# Patient Record
Sex: Male | Born: 1945
Health system: Southern US, Community
[De-identification: ages and names within clinical notes are randomized; demographics above are authoritative.]

## PROBLEM LIST (undated history)

## (undated) DIAGNOSIS — F1020 Alcohol dependence, uncomplicated: Secondary | ICD-10-CM

## (undated) DIAGNOSIS — R040 Epistaxis: Secondary | ICD-10-CM

## (undated) DIAGNOSIS — K709 Alcoholic liver disease, unspecified: Secondary | ICD-10-CM

## (undated) DIAGNOSIS — R413 Other amnesia: Secondary | ICD-10-CM

## (undated) DIAGNOSIS — R634 Abnormal weight loss: Secondary | ICD-10-CM

## (undated) DIAGNOSIS — N529 Male erectile dysfunction, unspecified: Secondary | ICD-10-CM

## (undated) DIAGNOSIS — Z720 Tobacco use: Secondary | ICD-10-CM

## (undated) DIAGNOSIS — E7800 Pure hypercholesterolemia, unspecified: Secondary | ICD-10-CM

## (undated) DIAGNOSIS — M069 Rheumatoid arthritis, unspecified: Secondary | ICD-10-CM

## (undated) DIAGNOSIS — R945 Abnormal results of liver function studies: Secondary | ICD-10-CM

## (undated) DIAGNOSIS — J439 Emphysema, unspecified: Secondary | ICD-10-CM

## (undated) DIAGNOSIS — K219 Gastro-esophageal reflux disease without esophagitis: Secondary | ICD-10-CM

## (undated) DIAGNOSIS — E7220 Disorder of urea cycle metabolism, unspecified: Secondary | ICD-10-CM

## (undated) DIAGNOSIS — R2689 Other abnormalities of gait and mobility: Secondary | ICD-10-CM

## (undated) DIAGNOSIS — I4510 Unspecified right bundle-branch block: Secondary | ICD-10-CM

## (undated) DIAGNOSIS — I998 Other disorder of circulatory system: Secondary | ICD-10-CM

## (undated) DIAGNOSIS — R296 Repeated falls: Secondary | ICD-10-CM

## (undated) HISTORY — DX: Emphysema, unspecified: J43.9

## (undated) HISTORY — PX: VASECTOMY: SHX75

## (undated) HISTORY — PX: VASECTOMY REVERSAL: SHX243

## (undated) HISTORY — DX: Alcohol dependence, uncomplicated: F10.20

---
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
## 2002-11-15 ENCOUNTER — Encounter: Admission: RE | Admit: 2002-11-15 | Discharge: 2002-11-15 | Payer: Self-pay | Admitting: Family Medicine

## 2002-11-15 ENCOUNTER — Encounter: Payer: Self-pay | Admitting: Family Medicine

## 2004-12-03 ENCOUNTER — Emergency Department (HOSPITAL_COMMUNITY): Admission: EM | Admit: 2004-12-03 | Discharge: 2004-12-03 | Payer: Self-pay | Admitting: Emergency Medicine

## 2008-04-27 DIAGNOSIS — M069 Rheumatoid arthritis, unspecified: Secondary | ICD-10-CM

## 2008-04-27 HISTORY — DX: Rheumatoid arthritis, unspecified: M06.9

## 2011-11-24 ENCOUNTER — Ambulatory Visit: Payer: Managed Care, Other (non HMO) | Admitting: Family Medicine

## 2011-11-24 ENCOUNTER — Ambulatory Visit: Payer: Managed Care, Other (non HMO)

## 2011-11-24 VITALS — BP 156/88 | HR 70 | Temp 98.2°F | Resp 14 | Ht 69.5 in | Wt 152.4 lb

## 2011-11-24 DIAGNOSIS — M25512 Pain in left shoulder: Secondary | ICD-10-CM

## 2011-11-24 DIAGNOSIS — M25519 Pain in unspecified shoulder: Secondary | ICD-10-CM

## 2011-11-24 MED ORDER — MELOXICAM 15 MG PO TABS: 15.0000 mg | ORAL_TABLET | Freq: Every day | ORAL | Status: DC

## 2011-11-24 MED ORDER — HYDROCODONE-ACETAMINOPHEN 5-325 MG PO TABS: 1.0000 | ORAL_TABLET | Freq: Every evening | ORAL | Status: AC | PRN

## 2011-11-24 NOTE — Progress Notes (Signed)
  Subjective:    Patient ID: William Gibson, male    DOB: 1946-03-22, 66 y.o.   MRN: [id number]  HPI Pt presents to clinic with 1-2 wks h/o L shoulder pain.  He is having trouble/pain with any movement of shoulder esp anterior shoulder movement.  He has had intermittent problems in the past but they have been the worst over the last couple of weeks.  No pain down arms or weakness.  The pain is sharp.  He has tried OTC motrin intermittently without any help.  He noticed that his shoulder was hurting a little after the glue on boxes at work got harder to open but over the last week it is worse.  He is R handed and having no problems with his R shoulder. He pushes with his R and pulls with his L.  He is unable to reach up and get something out of the cabinet due to pain.  Yesterday at work he kept his hand in his pocket so he would not use it because of the pain.  He has not had any injury known to this shoulder.   Review of Systems  Musculoskeletal: Negative for joint swelling.       Objective:   Physical Exam  Vitals reviewed. Constitutional: He is oriented to person, place, and time. He appears well-developed and well-nourished.  HENT:  Head: Normocephalic and atraumatic.  Right Ear: External ear normal.  Left Ear: External ear normal.  Nose: Nose normal.  Eyes: Conjunctivae are normal.  Pulmonary/Chest: Effort normal.  Musculoskeletal:       Left shoulder: He exhibits decreased range of motion (2nd to pain), pain, spasm (trap on L) and decreased strength (? 2nd to pain). He exhibits no tenderness, no bony tenderness, no swelling, no crepitus and no deformity.       Pt has good internal rotation and external rotation.  Pt unable to flex or abduct shoulder due to pain.  Pt can wiggle it into position or lift his L arm with his R. Neg impingement, unable to empty can because cannot hold up arm in the position.  + Neers and + hawkins.  Neurological: He is alert and oriented to person, place,  and time.  Skin: Skin is warm and dry.  Psychiatric: He has a normal mood and affect. His behavior is normal. Judgment and thought content normal.   [facility] reading (PRIMARY) by  Dr. Neva Seat. Degenerative changes in the [facility] joint but otherwise Nl study.     Assessment & Plan:   1. Left shoulder pain  DG Shoulder Left, HYDROcodone-acetaminophen (NORCO/VICODIN) 5-325 MG per tablet, meloxicam (MOBIC) 15 MG tablet  believe pt has subacromial bursitis and possible rotator cuff tendonitis/tendonosis.  Will try conservative measures for about 1 wk with NSAIDs and ice and then if no improvement consider shoulder injection.  Pt agrees with the plan.  D/w Dr. Neva Seat.

## 2011-11-24 NOTE — Progress Notes (Signed)
Xray read and patient discussed with Benny Lennert, PA-C.Marland Kitchen Agree with assessment and plan of care per her note.  Suspected rtc tendinosis with secondary subacromial bursitis.

## 2012-04-21 ENCOUNTER — Telehealth: Payer: Self-pay

## 2012-04-21 NOTE — Telephone Encounter (Signed)
Is there a 30 min spot where I can work him in the next week or two?

## 2012-04-21 NOTE — Telephone Encounter (Signed)
Appointment scheduled for next week.

## 2012-04-21 NOTE — Telephone Encounter (Signed)
pts wife, Rosey Bath said she had spoken with Dr Dayton Martes about seeing William Gibson; William Gibson not established; William Gibson has cough, usually non productive,hoarseness,? Fever(feels warm). William Gibson has been sick for over one month. Rosey Bath will advise William Gibson to go to [facility] but wants to establish with Dr Dayton Martes right away.Please advise.

## 2012-04-26 ENCOUNTER — Ambulatory Visit (INDEPENDENT_AMBULATORY_CARE_PROVIDER_SITE_OTHER)
Admission: RE | Admit: 2012-04-26 | Discharge: 2012-04-26 | Disposition: A | Discharge status: Home / Self Care | Payer: 59 | Source: Ambulatory Visit | Attending: Family Medicine | Admitting: Family Medicine

## 2012-04-26 ENCOUNTER — Encounter: Payer: Self-pay | Admitting: Internal Medicine

## 2012-04-26 ENCOUNTER — Ambulatory Visit (INDEPENDENT_AMBULATORY_CARE_PROVIDER_SITE_OTHER): Payer: 59 | Admitting: Family Medicine

## 2012-04-26 ENCOUNTER — Encounter: Payer: Self-pay | Admitting: Family Medicine

## 2012-04-26 VITALS — BP 144/90 | HR 60 | Temp 97.6°F | Ht 68.5 in | Wt 152.0 lb

## 2012-04-26 DIAGNOSIS — Z720 Tobacco use: Secondary | ICD-10-CM

## 2012-04-26 DIAGNOSIS — R1310 Dysphagia, unspecified: Secondary | ICD-10-CM

## 2012-04-26 DIAGNOSIS — F17200 Nicotine dependence, unspecified, uncomplicated: Secondary | ICD-10-CM

## 2012-04-26 DIAGNOSIS — R05 Cough: Secondary | ICD-10-CM

## 2012-04-26 DIAGNOSIS — R5383 Other fatigue: Secondary | ICD-10-CM

## 2012-04-26 DIAGNOSIS — R490 Dysphonia: Secondary | ICD-10-CM

## 2012-04-26 HISTORY — DX: Tobacco use: Z72.0

## 2012-04-26 LAB — CBC WITH DIFFERENTIAL/PLATELET
Basophils Absolute: 0 10*3/uL (ref 0.0–0.1)
Eosinophils Absolute: 0.2 10*3/uL (ref 0.0–0.7)
Lymphocytes Relative: 20.9 % (ref 12.0–46.0)
MCHC: 33.8 g/dL (ref 30.0–36.0)
MCV: 97.5 fl (ref 78.0–100.0)
Monocytes Absolute: 0.6 10*3/uL (ref 0.1–1.0)
Neutrophils Relative %: 67.4 % (ref 43.0–77.0)
RDW: 13.4 % (ref 11.5–14.6)

## 2012-04-26 LAB — COMPREHENSIVE METABOLIC PANEL
AST: 22 U/L (ref 0–37)
Albumin: 3.7 g/dL (ref 3.5–5.2)
Alkaline Phosphatase: 73 U/L (ref 39–117)
BUN: 15 mg/dL (ref 6–23)
Potassium: 3.9 mEq/L (ref 3.5–5.1)
Sodium: 136 mEq/L (ref 135–145)
Total Bilirubin: 1 mg/dL (ref 0.3–1.2)
Total Protein: 7.1 g/dL (ref 6.0–8.3)

## 2012-04-26 MED ORDER — AMOXICILLIN-POT CLAVULANATE 875-125 MG PO TABS: 1.0000 | ORAL_TABLET | Freq: Two times a day (BID) | ORAL | Status: DC

## 2012-04-26 MED ORDER — OMEPRAZOLE 20 MG PO CPDR: 20.0000 mg | DELAYED_RELEASE_CAPSULE | Freq: Every day | ORAL | Status: DC

## 2012-04-26 NOTE — Patient Instructions (Addendum)
Nice to meet you. After you go to the lab and xray, please stop by to see Shirlee Limerick on your way out.  We are starting omeprazole 20 mg daily.

## 2012-04-26 NOTE — Progress Notes (Signed)
Subjective:    Patient ID: William Gibson, male    DOB: 1945-06-17, 66 y.o.   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  He has not seen a routine physician in years.  PMH significant for smoking 2 ppd for over 50 years.  Has never quit smoking.  Has had a dry cough for years but now more productive.  Throat is always scratchy and he sounds hoarse.  Does occasionally feel acid in his throat.  Does take Xantac. Upon questioning, he does endorse difficulty swallowing solids, not liquids recently. Denies any recent weight loss.  No night sweats or fevers. Has never had a colonoscopy. No blood in his stool or changes in his bowel habits.  Dad died of prostate CA in his 77s.  Patient Active Problem List  Diagnosis  . Tobacco abuse  . Hoarseness of voice  . Dysphagia   No past medical history on file. No past surgical history on file. History  Substance Use Topics  . Smoking status: Current Every Day Smoker -- 2.0 packs/day    Types: Cigarettes  . Smokeless tobacco: Not on file  . Alcohol Use: Not on file   No family history on file. No Known Allergies Current Outpatient Prescriptions on File Prior to Visit  Medication Sig Dispense Refill  . omeprazole (PRILOSEC) 20 MG capsule Take 1 capsule (20 mg total) by mouth daily.  30 capsule  3   The PMH, PSH, Social History, Family History, Medications, and allergies have been reviewed in [facility], and have been updated if relevant.    Review of Systems    See HPI Does endorse fatigue No CP or SOB  Has had some sinus pressure Objective:   Physical Exam BP 144/90  Pulse 60  Temp 97.6 F (36.4 C)  Ht 5' 8.5" (1.74 m)  Wt 152 lb (68.947 kg)  BMI 22.78 kg/m2 General:  Thin male in NAD, voice is a little hoarse Eyes:  PERRL Ears:  External ear exam shows no significant lesions or deformities.  Otoscopic examination reveals clear  canals, tympanic membranes are intact bilaterally without bulging, retraction, inflammation or discharge. Hearing is grossly normal bilaterally. Nose:  External nasal examination shows no deformity or inflammation. Nasal mucosa are inflammed, frontal sinuses mildly TTP Mouth:  Oral mucosa and oropharynx without lesions or exudates.  Teeth in good repair. Neck:  no carotid bruit or thyromegaly no cervical or supraclavicular lymphadenopathy  Lungs:  Normal respiratory effort, chest expands symmetrically. Lungs are clear to auscultation, no crackles or wheezes. Heart:  Normal rate and regular rhythm. S1 and S2 normal without gallop, murmur, click, rub or other extra sounds. Abdomen:  Bowel sounds positive,abdomen soft and non-tender without masses, organomegaly or hernias noted. Pulses:  R and L posterior tibial pulses are full and equal bilaterally  Extremities:  no edema      Assessment & Plan:   1. Hoarseness of voice  In setting with associated symptoms of cough, dysphagia in a long term smoker, I am concerned about throat malignancy. Will refer to GI for endoscopy.  Also having symptoms of GERD- will add Prilosec 20 mg daily to his xantac. Ambulatory referral to Gastroenterology  2. Dysphagia  See above. Ambulatory referral to Gastroenterology  3. Fatigue  Differential is wide in a long term smoker, but obviously need to rule out malignancy.  Check CBC, PSA, CMET,  CXR today.  See above. CBC with Differential, PSA, Comprehensive metabolic panel  4.  Cough Likely due to GERD and or neoplasm but given duration of symptoms with changes in sputum and sinus pressure, will place on Augmentin. The patient indicates understanding of these issues and agrees with the plan.

## 2012-04-28 ENCOUNTER — Telehealth: Payer: Self-pay | Admitting: *Deleted

## 2012-04-28 NOTE — Telephone Encounter (Signed)
Prior Berkley Harvey is needed for omeprazole, form from [employer] is on your desk.

## 2012-04-29 ENCOUNTER — Telehealth: Payer: Self-pay | Admitting: *Deleted

## 2012-04-29 NOTE — Telephone Encounter (Signed)
In my box

## 2012-04-29 NOTE — Telephone Encounter (Signed)
Prior auth for omeprazole denied, letter is on your desk.

## 2012-04-29 NOTE — Telephone Encounter (Signed)
Form faxed

## 2012-05-02 NOTE — Telephone Encounter (Signed)
Advised patient's wife. 

## 2012-05-02 NOTE — Telephone Encounter (Signed)
Noted.  Please advise pt to try OTC prilosec 20 mg daily and find out from insurance which PPI they would cover.

## 2012-05-16 ENCOUNTER — Encounter: Payer: Self-pay | Admitting: Internal Medicine

## 2012-05-17 ENCOUNTER — Ambulatory Visit: Payer: 59 | Admitting: Internal Medicine

## 2012-06-09 ENCOUNTER — Ambulatory Visit: Payer: 59 | Admitting: Family Medicine

## 2012-06-13 ENCOUNTER — Ambulatory Visit (INDEPENDENT_AMBULATORY_CARE_PROVIDER_SITE_OTHER): Payer: 59 | Admitting: Family Medicine

## 2012-06-13 ENCOUNTER — Encounter: Payer: Self-pay | Admitting: Family Medicine

## 2012-06-13 VITALS — BP 140/90 | HR 72 | Temp 97.9°F | Wt 155.0 lb

## 2012-06-13 DIAGNOSIS — Z136 Encounter for screening for cardiovascular disorders: Secondary | ICD-10-CM

## 2012-06-13 DIAGNOSIS — Z0000 Encounter for general adult medical examination without abnormal findings: Secondary | ICD-10-CM

## 2012-06-13 LAB — LIPID PANEL
Total CHOL/HDL Ratio: 3
Triglycerides: 113 mg/dL (ref 0.0–149.0)
VLDL: 22.6 mg/dL (ref 0.0–40.0)

## 2012-06-13 LAB — LDL CHOLESTEROL, DIRECT: Direct LDL: 126.6 mg/dL

## 2012-06-13 NOTE — Progress Notes (Signed)
Subjective:    Patient ID: William Gibson, male    DOB: [birth year], 67 y.o.   MRN: [id number]  HPI 67 yo here to have wellness forms filled out for his insurance provider.  PMH significant for smoking 2 ppd for over 50 years.  Has never quit smoking.  Does not want to quit.  Lab Results  Component Value Date   PSA 1.11 05-24-12   Dad died of prostate CA in his 53s.  I referred him for colonoscopy in December but he cancelled appt.  He is refusing at this time.  Lab Results  Component Value Date   WBC 7.0 05-24-2012   HGB 14.1 2012-05-24   HCT 41.8 May 24, 2012   MCV 97.5 05/24/12   PLT 215.0 2012/05/24       Patient Active Problem List  Diagnosis  . Tobacco abuse  . Hoarseness of voice  . Dysphagia  . Routine general medical examination at a health care facility   No past medical history on file. No past surgical history on file. History  Substance Use Topics  . Smoking status: Current Every Day Smoker -- 2.00 packs/day    Types: Cigarettes  . Smokeless tobacco: Not on file  . Alcohol Use: Not on file   No family history on file. No Known Allergies Current Outpatient Prescriptions on File Prior to Visit  Medication Sig Dispense Refill  . omeprazole (PRILOSEC) 20 MG capsule Take 1 capsule (20 mg total) by mouth daily.  30 capsule  3   No current facility-administered medications on file prior to visit.   The PMH, PSH, Social History, Family History, Medications, and allergies have been reviewed in [facility], and have been updated if relevant.    Review of Systems  SOB has resolved Patient reports no  vision/ hearing changes,anorexia, weight change, fever ,adenopathy, persistant / recurrent hoarseness, swallowing issues, chest pain, edema,persistant / recurrent cough, hemoptysis, dyspnea(rest, exertional, paroxysmal nocturnal), gastrointestinal  bleeding (melena, rectal bleeding), abdominal pain, excessive heart burn, GU symptoms(dysuria, hematuria, pyuria,  voiding/incontinence  Issues) syncope, focal weakness, severe memory loss, concerning skin lesions, depression, anxiety, abnormal bruising/bleeding, major joint swelling.     Objective:   Physical Exam BP 140/90  Pulse 72  Temp(Src) 97.9 F (36.6 C)  Wt 155 lb (70.308 kg)  BMI 23.22 kg/m2 General:  Thin male in NAD, voice is a little hoarse Eyes:  PERRL Ears:  External ear exam shows no significant lesions or deformities.  Otoscopic examination reveals clear canals, tympanic membranes are intact bilaterally without bulging, retraction, inflammation or discharge. Hearing is grossly normal bilaterally. Nose:  External nasal examination shows no deformity or inflammation. Nasal mucosa are inflammed, frontal sinuses mildly TTP Mouth:  Oral mucosa and oropharynx without lesions or exudates.  Teeth in good repair. Neck:  no carotid bruit or thyromegaly no cervical or supraclavicular lymphadenopathy  Lungs:  Normal respiratory effort, chest expands symmetrically. Lungs are clear to auscultation, no crackles or wheezes. Heart:  Normal rate and regular rhythm. S1 and S2 normal without gallop, murmur, click, rub or other extra sounds. Abdomen:  Bowel sounds positive,abdomen soft and non-tender without masses, organomegaly or hernias noted. Pulses:  R and L posterior tibial pulses are full and equal bilaterally  Extremities:  no edema      Assessment & Plan:  Routine general medical examination at a health care facility  Screening for ischemic heart disease - Plan: Lipid Panel  Reviewed preventive care protocols, scheduled due services, and updated immunizations Discussed nutrition, exercise,  diet, and healthy lifestyle.

## 2012-06-13 NOTE — Patient Instructions (Addendum)
Great to see you, [name]. I will call you with your lab results and to let you know that you can pick up your form.

## 2012-07-07 ENCOUNTER — Other Ambulatory Visit: Payer: Self-pay | Admitting: Family Medicine

## 2012-07-16 ENCOUNTER — Other Ambulatory Visit: Payer: Self-pay | Admitting: Family Medicine

## 2012-07-18 NOTE — Telephone Encounter (Signed)
Pt is requesting refill on antibiotic because he says he has the crud.  Advised him that he will need appt to be evaluated before antibiotic can be called in.  appt scheduled.  Refill denied to pharmacy.

## 2012-07-20 ENCOUNTER — Ambulatory Visit (INDEPENDENT_AMBULATORY_CARE_PROVIDER_SITE_OTHER): Payer: 59 | Admitting: Family Medicine

## 2012-07-20 VITALS — BP 128/90 | HR 96 | Temp 97.5°F | Wt 156.0 lb

## 2012-07-20 DIAGNOSIS — J069 Acute upper respiratory infection, unspecified: Secondary | ICD-10-CM

## 2012-07-20 MED ORDER — FLUTICASONE PROPIONATE 50 MCG/ACT NA SUSP: 2.0000 | Freq: Every day | NASAL | Status: DC

## 2012-07-20 MED ORDER — AMOXICILLIN-POT CLAVULANATE 875-125 MG PO TABS: 1.0000 | ORAL_TABLET | Freq: Two times a day (BID) | ORAL | Status: DC

## 2012-07-20 NOTE — Patient Instructions (Addendum)
Take antibiotic as directed.  Drink lots of fluids. Use flonase as directed.     Treat sympotmatically with Mucinex, nasal saline irrigation, and Tylenol/Ibuprofen.   Also try an antihistamine/decongestant like claritin D or zyrtec D over the counter- two times a day as needed ( have to sign for them at pharmacy).    Call if not improving as expected in 5-7 days.

## 2012-07-20 NOTE — Progress Notes (Signed)
SUBJECTIVE:  William Gibson is a 67 y.o. male who complains of coryza, congestion, sneezing, sore throat, dry cough, headache and bilateral sinus pain for 10 days. He denies a history of anorexia, chest pain and fevers and denies a history of asthma. Patient admits to smoke cigarettes.   Patient Active Problem List  Diagnosis  . Tobacco abuse   No past medical history on file. No past surgical history on file. History  Substance Use Topics  . Smoking status: Current Every Day Smoker -- 2.00 packs/day    Types: Cigarettes  . Smokeless tobacco: Not on file  . Alcohol Use: Not on file   No family history on file. No Known Allergies Current Outpatient Prescriptions on File Prior to Visit  Medication Sig Dispense Refill  . omeprazole (PRILOSEC) 20 MG capsule Take 1 capsule (20 mg total) by mouth daily.  30 capsule  3   No current facility-administered medications on file prior to visit.   The PMH, PSH, Social History, Family History, Medications, and allergies have been reviewed in [facility], and have been updated if relevant.  OBJECTIVE: BP 128/90  Pulse 96  Temp(Src) 97.5 F (36.4 C)  Wt 156 lb (70.761 kg)  BMI 23.37 kg/m2  He appears well, vital signs are as noted. Ears normal.  Throat and pharynx normal.  Neck supple. No adenopathy in the neck. Nose is congested. Sinuses tender. The chest is clear, without wheezes or rales.  ASSESSMENT:  sinusitis  PLAN: Given duration and progression of symptoms, will treat for bacterial sinusitis with Augmentin.  Symptomatic therapy suggested: flonase, push fluids, rest and return office visit prn if symptoms persist or worsen. Lack of antibiotic effectiveness discussed with him. Call or return to clinic prn if these symptoms worsen or fail to improve as anticipated.

## 2012-12-13 ENCOUNTER — Encounter: Payer: Self-pay | Admitting: Family Medicine

## 2012-12-13 ENCOUNTER — Ambulatory Visit (INDEPENDENT_AMBULATORY_CARE_PROVIDER_SITE_OTHER): Payer: 59 | Admitting: Family Medicine

## 2012-12-13 VITALS — BP 140/80 | HR 68 | Temp 97.7°F | Ht 68.5 in | Wt 156.0 lb

## 2012-12-13 DIAGNOSIS — M25512 Pain in left shoulder: Secondary | ICD-10-CM

## 2012-12-13 DIAGNOSIS — M25519 Pain in unspecified shoulder: Secondary | ICD-10-CM

## 2012-12-13 DIAGNOSIS — F17200 Nicotine dependence, unspecified, uncomplicated: Secondary | ICD-10-CM

## 2012-12-13 DIAGNOSIS — Z720 Tobacco use: Secondary | ICD-10-CM

## 2012-12-13 MED ORDER — MELOXICAM 15 MG PO TABS: 15.0000 mg | ORAL_TABLET | Freq: Every day | ORAL | Status: DC

## 2012-12-13 NOTE — Progress Notes (Signed)
  Subjective:    Patient ID: William Gibson, male    DOB: 1946-03-07, 67 y.o.   MRN: [id number]  HPI  Very pleasant 67 yo male with h/o arthritis here for bilateral shoulder pain, right > left for past several weeks.  Pain has been improving.  No known injury but does lift heavy objects at work.  Pain improved when he took some left over meloxicam.  No UE weakness.  Tobacco abuse- PMH significant for smoking 2 ppd for over 50 years. Has never quit smoking.   Has been thinking more and more about quitting.  Patches have not worked for him in past.  Patient Active Problem List   Diagnosis Date Noted  . Pain in joint, shoulder region 12/13/2012  . Tobacco abuse 04/26/2012   No past medical history on file. No past surgical history on file. History  Substance Use Topics  . Smoking status: Current Every Day Smoker -- 2.00 packs/day    Types: Cigarettes  . Smokeless tobacco: Not on file  . Alcohol Use: Not on file   No family history on file. No Known Allergies Current Outpatient Prescriptions on File Prior to Visit  Medication Sig Dispense Refill  . fluticasone (FLONASE) 50 MCG/ACT nasal spray Place 2 sprays into the nose daily.  16 g  6  . omeprazole (PRILOSEC) 20 MG capsule Take 1 capsule (20 mg total) by mouth daily.  30 capsule  3   No current facility-administered medications on file prior to visit.   The PMH, PSH, Social History, Family History, Medications, and allergies have been reviewed in [facility], and have been updated if relevant.   Review of Systems See HPI     Objective:   Physical Exam BP 140/80  Pulse 68  Temp(Src) 97.7 F (36.5 C)  Ht 5' 8.5" (1.74 m)  Wt 156 lb (70.761 kg)  BMI 23.37 kg/m2 Gen:  Alert, pleasant, NAD MSK: Right shoulder- some decreased ROM- mildly positive arch sign, otherwise normal exam. Left shoulder- normal exam Psych:  Good eye contact.  Not anxious or depressed appearing.    Assessment & Plan:  1. Bilateral shoulder pain Most  likely due to some rotator cuff tendonitis secondary to chronic arthritis. Mobic rx refilled- advised to take with food and use sparingly. The patient indicates understanding of these issues and agrees with the plan.  - meloxicam (MOBIC) 15 MG tablet; Take 1 tablet (15 mg total) by mouth daily with breakfast.  Dispense: 30 tablet; Refill: 0  2. Tobacco abuse  Smoking cessation instruction/counseling given:  counseled patient on the dangers of tobacco use, advised patient to stop smoking, and reviewed strategies to maximize success He may want to try chantix.  He will think about it and get back to me.

## 2012-12-13 NOTE — Patient Instructions (Addendum)
Good to see you. Please say hi to your wife for me.  Take Meloxicam (mobic) with food as directed.  Update me if your symptoms do not continue to improve.  Let me know when you're ready to quit smoking.

## 2013-01-24 ENCOUNTER — Other Ambulatory Visit: Payer: Self-pay | Admitting: Family Medicine

## 2013-01-24 NOTE — Telephone Encounter (Signed)
Last office visit 12/13/2012.  Ok to refill?

## 2013-02-25 ENCOUNTER — Other Ambulatory Visit: Payer: Self-pay | Admitting: Family Medicine

## 2013-02-26 NOTE — Telephone Encounter (Signed)
Last OV 12/13/2012. Ok to refill?

## 2013-05-03 ENCOUNTER — Telehealth: Payer: Self-pay | Admitting: Family Medicine

## 2013-05-03 NOTE — Telephone Encounter (Signed)
Yes ok to accommodate.

## 2013-05-03 NOTE — Telephone Encounter (Signed)
Pt's wife called and says pt needs a CPE prior to the end of February due to insurance purposes, however, the first CPE availability you have is not until 07/03/2013. Can you accommodate pt a CPE prior to end of February? Thank you.

## 2013-05-05 NOTE — Telephone Encounter (Signed)
Pt scheduled for 05/22/2013

## 2013-05-12 ENCOUNTER — Other Ambulatory Visit: Payer: Self-pay | Admitting: Family Medicine

## 2013-05-12 DIAGNOSIS — Z125 Encounter for screening for malignant neoplasm of prostate: Secondary | ICD-10-CM

## 2013-05-12 DIAGNOSIS — Z136 Encounter for screening for cardiovascular disorders: Secondary | ICD-10-CM

## 2013-05-12 DIAGNOSIS — Z0000 Encounter for general adult medical examination without abnormal findings: Secondary | ICD-10-CM

## 2013-05-15 ENCOUNTER — Other Ambulatory Visit (INDEPENDENT_AMBULATORY_CARE_PROVIDER_SITE_OTHER): Payer: 59

## 2013-05-15 ENCOUNTER — Ambulatory Visit (INDEPENDENT_AMBULATORY_CARE_PROVIDER_SITE_OTHER): Payer: 59 | Admitting: Internal Medicine

## 2013-05-15 ENCOUNTER — Encounter: Payer: Self-pay | Admitting: Internal Medicine

## 2013-05-15 VITALS — BP 134/84 | HR 99 | Temp 97.7°F | Wt 167.2 lb

## 2013-05-15 DIAGNOSIS — J309 Allergic rhinitis, unspecified: Secondary | ICD-10-CM

## 2013-05-15 DIAGNOSIS — Z125 Encounter for screening for malignant neoplasm of prostate: Secondary | ICD-10-CM

## 2013-05-15 DIAGNOSIS — Z0000 Encounter for general adult medical examination without abnormal findings: Secondary | ICD-10-CM

## 2013-05-15 DIAGNOSIS — Z136 Encounter for screening for cardiovascular disorders: Secondary | ICD-10-CM

## 2013-05-15 LAB — COMPREHENSIVE METABOLIC PANEL
ALBUMIN: 3.7 g/dL (ref 3.5–5.2)
ALT: 24 U/L (ref 0–53)
AST: 25 U/L (ref 0–37)
Alkaline Phosphatase: 80 U/L (ref 39–117)
BUN: 12 mg/dL (ref 6–23)
CALCIUM: 8.8 mg/dL (ref 8.4–10.5)
CHLORIDE: 103 meq/L (ref 96–112)
CO2: 27 meq/L (ref 19–32)
CREATININE: 0.8 mg/dL (ref 0.4–1.5)
GFR: 96.57 mL/min (ref >60.00)
Glucose, Bld: 105 mg/dL — ABNORMAL HIGH (ref 70–99)
Potassium: 4.5 mEq/L (ref 3.5–5.1)
Sodium: 137 mEq/L (ref 135–145)
Total Bilirubin: 0.5 mg/dL (ref 0.3–1.2)
Total Protein: 7.1 g/dL (ref 6.0–8.3)

## 2013-05-15 LAB — CBC WITH DIFFERENTIAL/PLATELET
BASOS PCT: 0.5 % (ref 0.0–3.0)
Basophils Absolute: 0 10*3/uL (ref 0.0–0.1)
Eosinophils Absolute: 0.3 10*3/uL (ref 0.0–0.7)
Eosinophils Relative: 3.7 % (ref 0.0–5.0)
HCT: 42.7 % (ref 39.0–52.0)
HEMOGLOBIN: 14.3 g/dL (ref 13.0–17.0)
LYMPHS PCT: 24.2 % (ref 12.0–46.0)
Lymphs Abs: 1.9 10*3/uL (ref 0.7–4.0)
MCHC: 33.5 g/dL (ref 30.0–36.0)
MCV: 97.1 fl (ref 78.0–100.0)
MONOS PCT: 9.1 % (ref 3.0–12.0)
Monocytes Absolute: 0.7 10*3/uL (ref 0.1–1.0)
NEUTROS ABS: 4.9 10*3/uL (ref 1.4–7.7)
Neutrophils Relative %: 62.5 % (ref 43.0–77.0)
Platelets: 241 10*3/uL (ref 150.0–400.0)
RBC: 4.39 Mil/uL (ref 4.22–5.81)
RDW: 13.5 % (ref 11.5–14.6)
WBC: 7.8 10*3/uL (ref 4.5–10.5)

## 2013-05-15 LAB — LIPID PANEL
Cholesterol: 224 mg/dL — ABNORMAL HIGH (ref 0–200)
HDL: 63.1 mg/dL (ref >39.00)
TRIGLYCERIDES: 114 mg/dL (ref 0.0–149.0)
Total CHOL/HDL Ratio: 4
VLDL: 22.8 mg/dL (ref 0.0–40.0)

## 2013-05-15 LAB — LDL CHOLESTEROL, DIRECT: Direct LDL: 143.8 mg/dL

## 2013-05-15 LAB — PSA: PSA: 1.45 ng/mL (ref 0.10–4.00)

## 2013-05-15 MED ORDER — PREDNISONE 10 MG PO TABS: ORAL_TABLET | ORAL | Status: DC

## 2013-05-15 NOTE — Progress Notes (Signed)
HPI  Pt presents to the clinic today with c/o cough, sore throat, nasal congestion. This started about 2 weeks ago. He has been cough mucous up, which is clear in color. He denies fever, chills or body aches. He has tried tylenol cold and sinus and Mucinex. He is a current smoker. He has had sick contacts. He has no history of allergies, or breathing problems.  Review of Systems     History reviewed. No pertinent past medical history.  History reviewed. No pertinent family history.  History   Social History  . Marital Status: Married    Spouse Name: N/A    Number of Children: N/A  . Years of Education: N/A   Occupational History  . Not on file.   Social History Main Topics  . Smoking status: Current Every Day Smoker -- 2.00 packs/day    Types: Cigarettes  . Smokeless tobacco: Not on file  . Alcohol Use: Yes     Comment: moderate  . Drug Use: Not on file  . Sexual Activity: Not on file   Other Topics Concern  . Not on file   Social History Narrative  . No narrative on file    No Known Allergies   Constitutional: Positive headache, fatigue. Denies fever or abrupt weight changes.  HEENT:  Positive sore throat. Denies eye redness, eye pain, pressure behind the eyes, facial pain, nasal congestion, ear pain, ringing in the ears, wax buildup, runny nose or bloody nose. Respiratory: Positive cough. Denies difficulty breathing or shortness of breath.  Cardiovascular: Denies chest pain, chest tightness, palpitations or swelling in the hands or feet.   No other specific complaints in a complete review of systems (except as listed in HPI above).  Objective:   BP 134/84  Pulse 99  Temp(Src) 97.7 F (36.5 C) (Oral)  Wt 167 lb 4 oz (75.864 kg)  SpO2 98% Wt Readings from Last 3 Encounters:  05/15/13 167 lb 4 oz (75.864 kg)  12/13/12 156 lb (70.761 kg)  07/20/12 156 lb (70.761 kg)     General: Appears his stated age, well developed, well nourished in NAD. HEENT: Head:  normal shape and size; Eyes: sclera white, no icterus, conjunctiva pink, PERRLA and EOMs intact; Ears: Tm's gray and intact, normal light reflex; Nose: mucosa pink and moist, septum midline; Throat/Mouth: + PND. Teeth present, mucosa erythematous and moist, no exudate noted, no lesions or ulcerations noted.  Neck:. Neck supple, trachea midline. No massses, lumps or thyromegaly present.  Cardiovascular: Normal rate and rhythm. S1,S2 noted.  No murmur, rubs or gallops noted. No JVD or BLE edema. No carotid bruits noted. Pulmonary/Chest: Normal effort and positive vesicular breath sounds. No respiratory distress. No wheezes, rales or ronchi noted.      Assessment & Plan:   Allergic Rhinitis:  Get some rest and drink plenty of water Do salt water gargles for the sore throat eRx for pred taper Try an OTC Allegra daily Stop smoking!!!  RTC as needed or if symptoms persist.

## 2013-05-15 NOTE — Progress Notes (Signed)
Pre-visit discussion using our clinic review tool. No additional management support is needed unless otherwise documented below in the visit note.  

## 2013-05-15 NOTE — Patient Instructions (Signed)
Allergic Rhinitis Allergic rhinitis is when the mucous membranes in the nose respond to allergens. Allergens are particles in the air that cause your body to have an allergic reaction. This causes you to release allergic antibodies. Through a chain of events, these eventually cause you to release histamine into the blood stream. Although meant to protect the body, it is this release of histamine that causes your discomfort, such as frequent sneezing, congestion, and an itchy, runny nose.  CAUSES  Seasonal allergic rhinitis (hay fever) is caused by pollen allergens that may come from grasses, trees, and weeds. Year-round allergic rhinitis (perennial allergic rhinitis) is caused by allergens such as house dust mites, pet dander, and mold spores.  SYMPTOMS   Nasal stuffiness (congestion).  Itchy, runny nose with sneezing and tearing of the eyes. DIAGNOSIS  Your health care provider can help you determine the allergen or allergens that trigger your symptoms. If you and your health care provider are unable to determine the allergen, skin or blood testing may be used. TREATMENT  Allergic Rhinitis does not have a cure, but it can be controlled by:  Medicines and allergy shots (immunotherapy).  Avoiding the allergen. Hay fever may often be treated with antihistamines in pill or nasal spray forms. Antihistamines block the effects of histamine. There are over-the-counter medicines that may help with nasal congestion and swelling around the eyes. Check with your health care provider before taking or giving this medicine.  If avoiding the allergen or the medicine prescribed do not work, there are many new medicines your health care provider can prescribe. Stronger medicine may be used if initial measures are ineffective. Desensitizing injections can be used if medicine and avoidance does not work. Desensitization is when a patient is given ongoing shots until the body becomes less sensitive to the allergen.  Make sure you follow up with your health care provider if problems continue. HOME CARE INSTRUCTIONS It is not possible to completely avoid allergens, but you can reduce your symptoms by taking steps to limit your exposure to them. It helps to know exactly what you are allergic to so that you can avoid your specific triggers. SEEK MEDICAL CARE IF:   You have a fever.  You develop a cough that does not stop easily (persistent).  You have shortness of breath.  You start wheezing.  Symptoms interfere with normal daily activities. Document Released: 01/06/2001 Document Revised: 02/01/2013 Document Reviewed: 12/19/2012 [employer] Patient Information 2014 [employer].  

## 2013-05-22 ENCOUNTER — Encounter: Payer: Self-pay | Admitting: Family Medicine

## 2013-05-22 ENCOUNTER — Ambulatory Visit (INDEPENDENT_AMBULATORY_CARE_PROVIDER_SITE_OTHER): Payer: 59 | Admitting: Family Medicine

## 2013-05-22 VITALS — BP 116/84 | HR 95 | Temp 97.9°F | Ht 68.5 in | Wt 160.8 lb

## 2013-05-22 DIAGNOSIS — E7800 Pure hypercholesterolemia, unspecified: Secondary | ICD-10-CM

## 2013-05-22 DIAGNOSIS — Z1211 Encounter for screening for malignant neoplasm of colon: Secondary | ICD-10-CM

## 2013-05-22 DIAGNOSIS — N529 Male erectile dysfunction, unspecified: Secondary | ICD-10-CM

## 2013-05-22 DIAGNOSIS — Z0000 Encounter for general adult medical examination without abnormal findings: Secondary | ICD-10-CM

## 2013-05-22 DIAGNOSIS — J069 Acute upper respiratory infection, unspecified: Secondary | ICD-10-CM

## 2013-05-22 DIAGNOSIS — F17200 Nicotine dependence, unspecified, uncomplicated: Secondary | ICD-10-CM

## 2013-05-22 DIAGNOSIS — Z720 Tobacco use: Secondary | ICD-10-CM

## 2013-05-22 HISTORY — DX: Pure hypercholesterolemia, unspecified: E78.00

## 2013-05-22 HISTORY — DX: Male erectile dysfunction, unspecified: N52.9

## 2013-05-22 MED ORDER — AMOXICILLIN-POT CLAVULANATE 875-125 MG PO TABS: 1.0000 | ORAL_TABLET | Freq: Two times a day (BID) | ORAL | Status: AC

## 2013-05-22 MED ORDER — VARENICLINE TARTRATE 0.5 MG X 11 & 1 MG X 42 PO MISC: ORAL | Status: DC

## 2013-05-22 MED ORDER — SILDENAFIL CITRATE 100 MG PO TABS
50.0000 mg | ORAL_TABLET | Freq: Every day | ORAL | Status: DC | PRN
Start: 2013-05-22 — End: 2014-05-23

## 2013-05-22 MED ORDER — FLUTICASONE PROPIONATE 50 MCG/ACT NA SUSP: 2.0000 | Freq: Every day | NASAL | Status: DC

## 2013-05-22 NOTE — Assessment & Plan Note (Signed)
Aware of risks of Viagra. eRx refilled.

## 2013-05-22 NOTE — Progress Notes (Signed)
Subjective:    Patient ID: William Gibson, male    DOB: 05/19/1945, 68 y.o.   MRN: [id number]  HPI 68 yo here for annual medicare wellness visit.  I have personally reviewed the Medicare Annual Wellness questionnaire and have noted 1. The patient's medical and social history 2. Their use of alcohol, tobacco or illicit drugs 3. Their current medications and supplements 4. The patient's functional ability including ADL's, fall risks, home safety risks and hearing or visual             impairment. 5. Diet and physical activities 6. Evidence for depression or mood disorders  End of life wishes discussed and updated in Social History.  Progressive URI symptoms- saw Webb Silversmith on 1/19.  Given prednisone and advised Allegra.  Symptoms have worsened.  Cough more productive. Denies SOB or CP.  PMH significant for smoking 2 ppd (unfiltered) for over 50 years.  Has never quit smoking.  He thinks he may be ready to quit!  Lab Results  Component Value Date   PSA 1.45 05/15/2013   PSA 1.11 2012-05-26   Dad died of prostate CA in his 65s.  I referred him for colonoscopy last but he cancelled appt.  He thinks he is ready to go for it now.  Denies any changes in his bowel habits or blood in his stool.  Lab Results  Component Value Date   WBC 7.8 05/15/2013   HGB 14.3 05/15/2013   HCT 42.7 05/15/2013   MCV 97.1 05/15/2013   PLT 241.0 05/15/2013   Elevated LDL- elevated since last year.  Does not feel he has changed his diet significantly. Lab Results  Component Value Date   CHOL 224* 05/15/2013   HDL 63.10 05/15/2013   LDLDIRECT 143.8 05/15/2013   TRIG 114.0 05/15/2013   CHOLHDL 4 05/15/2013   ED- has used viagra in past and would like to try it again.  Once he gets an erection, he has no difficulty sustaining it.   Patient Active Problem List   Diagnosis Date Noted  . Elevated LDL cholesterol level 05/22/2013  . Routine general medical examination at a health care facility 05/12/2013  .  Pain in joint, shoulder region 12/13/2012  . Tobacco abuse May 26, 2012   No past medical history on file. No past surgical history on file. History  Substance Use Topics  . Smoking status: Current Every Day Smoker -- 2.00 packs/day    Types: Cigarettes  . Smokeless tobacco: Not on file  . Alcohol Use: Yes     Comment: moderate   No family history on file. No Known Allergies Current Outpatient Prescriptions on File Prior to Visit  Medication Sig Dispense Refill  . predniSONE (DELTASONE) 10 MG tablet Take 3 tabs on days 1-2, take 2 tabs on days 3-4, take 1 tabs on days 5-6  12 tablet  0   No current facility-administered medications on file prior to visit.   The PMH, PSH, Social History, Family History, Medications, and allergies have been reviewed in [facility], and have been updated if relevant.    Review of Systems  SOB has resolved Patient reports no  vision/ hearing changes,anorexia, weight change, fever ,adenopathy,  swallowing issues, chest pain, edema, hemoptysis, dyspnea(rest, exertional, paroxysmal nocturnal), gastrointestinal  bleeding (melena, rectal bleeding), abdominal pain, excessive heart burn, GU symptoms(dysuria, hematuria, pyuria, voiding/incontinence  Issues) syncope, focal weakness, severe memory loss, concerning skin lesions, depression, anxiety, abnormal bruising/bleeding, major joint swelling.     Objective:   Physical Exam  BP 116/84  Pulse 95  Temp(Src) 97.9 F (36.6 C) (Oral)  Ht 5' 8.5" (1.74 m)  Wt 160 lb 12 oz (72.916 kg)  BMI 24.08 kg/m2  SpO2 93% General:  Thin male in NAD, voice is a little hoarse (baseline) Eyes:  PERRL Ears:  External ear exam shows no significant lesions or deformities.  Otoscopic examination reveals clear canals, tympanic membranes are intact bilaterally without bulging, retraction, inflammation or discharge. Hearing is grossly normal bilaterally. Nose:  External nasal examination shows no deformity or inflammation. Nasal mucosa  are inflammed, frontal sinuses mildly TTP Mouth:  Oral mucosa and oropharynx without lesions or exudates.  Teeth in good repair. Neck:  no carotid bruit or thyromegaly no cervical or supraclavicular lymphadenopathy  Lungs:  Normal respiratory effort, chest expands symmetrically.  Faint wheeze RLL Heart:  Normal rate and regular rhythm. S1 and S2 normal without gallop, murmur, click, rub or other extra sounds. Abdomen:  Bowel sounds positive,abdomen soft and non-tender without masses, organomegaly or hernias noted. Pulses:  R and L posterior tibial pulses are full and equal bilaterally  Extremities:  no edema      Assessment & Plan:

## 2013-05-22 NOTE — Assessment & Plan Note (Signed)
Counseled on smoking cessation and he is ready to quit! Discussed options, he would like to try Chantix. Rx given to pt for starter pack.

## 2013-05-22 NOTE — Patient Instructions (Signed)
Good to see you. I'm proud of you for being ready to quit smoking.  We will call you with your GI appointment for colonoscopy.  Take Augmentin as directed- 1 tablet twice daily for 10 days. We are starting Flonase.  Take Chantix as directed- pick a quit date.

## 2013-05-22 NOTE — Progress Notes (Signed)
Pre-visit discussion using our clinic review tool. No additional management support is needed unless otherwise documented below in the visit note.  

## 2013-05-22 NOTE — Assessment & Plan Note (Signed)
The patients weight, height, BMI and visual acuity have been recorded in the chart I have made referrals, counseling and provided education to the patient based review of the above and I have provided the pt with a written personalized care plan for preventive services.  

## 2013-05-22 NOTE — Assessment & Plan Note (Signed)
Given duration and progression of symptoms and tobacco abuse, will treat for bacterial process with Augmentin. Add Flonase.

## 2013-05-25 ENCOUNTER — Encounter: Payer: Self-pay | Admitting: Internal Medicine

## 2013-05-30 ENCOUNTER — Telehealth: Payer: Self-pay | Admitting: Family Medicine

## 2013-05-30 NOTE — Telephone Encounter (Signed)
Relevant patient education assigned to patient using Emmi. ° °

## 2013-06-15 ENCOUNTER — Ambulatory Visit (AMBULATORY_SURGERY_CENTER): Payer: Self-pay

## 2013-06-15 VITALS — Ht 69.0 in | Wt 155.0 lb

## 2013-06-15 DIAGNOSIS — Z1211 Encounter for screening for malignant neoplasm of colon: Secondary | ICD-10-CM

## 2013-06-15 MED ORDER — MOVIPREP 100 G PO SOLR: 1.0000 | Freq: Once | ORAL | Status: DC

## 2013-06-15 NOTE — Progress Notes (Signed)
Doesn't use computer.  Wife does.  She is in hospital with broken hip.

## 2013-06-22 ENCOUNTER — Encounter: Payer: Self-pay | Admitting: Internal Medicine

## 2013-06-28 ENCOUNTER — Encounter: Payer: Self-pay | Admitting: Internal Medicine

## 2013-06-28 ENCOUNTER — Ambulatory Visit (AMBULATORY_SURGERY_CENTER): Payer: Managed Care, Other (non HMO) | Admitting: Internal Medicine

## 2013-06-28 VITALS — BP 129/84 | HR 72 | Temp 97.2°F | Resp 16 | Ht 69.0 in | Wt 155.0 lb

## 2013-06-28 DIAGNOSIS — D126 Benign neoplasm of colon, unspecified: Secondary | ICD-10-CM

## 2013-06-28 DIAGNOSIS — Z1211 Encounter for screening for malignant neoplasm of colon: Secondary | ICD-10-CM

## 2013-06-28 MED ORDER — SODIUM CHLORIDE 0.9 % IV SOLN
500.0000 mL | INTRAVENOUS | Status: DC
Start: 2013-06-28 — End: 2013-06-28

## 2013-06-28 NOTE — Op Note (Signed)
[place]  [address] [place], [address]   COLONOSCOPY PROCEDURE REPORT  PATIENT: William Gibson, William Gibson  MR#: [id number] BIRTHDATE: [birth year] , 53  yrs. old GENDER: Male ENDOSCOPIST: Jerene Bears, MD REFERRED OQ:HUTML Aron, M.D. PROCEDURE DATE:  06/28/2013 PROCEDURE:   Colonoscopy with snare polypectomy First Screening Colonoscopy - Avg.  risk and is 50 yrs.  old or older Yes.  Prior Negative Screening - Now for repeat screening. N/A  History of Adenoma - Now for follow-up colonoscopy & has been > or = to 3 yrs.  N/A  Polyps Removed Today? Yes. ASA CLASS:   Class II INDICATIONS:average risk screening and first colonoscopy. MEDICATIONS: MAC sedation, administered by CRNA and propofol (Diprivan) 200mg  IV  DESCRIPTION OF PROCEDURE:   After the risks benefits and alternatives of the procedure were thoroughly explained, informed consent was obtained.  A digital rectal exam revealed no rectal mass.   The LB CF-H180AL Loaner [id number]  endoscope was introduced through the anus and advanced to the cecum, which was identified by both the appendix and ileocecal valve. No adverse events experienced.   The quality of the prep was good, using MoviPrep The instrument was then slowly withdrawn as the colon was fully examined.   COLON FINDINGS: Five sessile polyps measuring 4-6 mm in size were found in the sigmoid colon and rectosigmoid colon.  Polypectomy was performed using cold snare.  The resections were complete and the polyp tissue was partially retrieved (4 of 5 retrieved).   There was mild scattered diverticulosis noted in the sigmoid colon. Retroflexed views revealed no abnormalities. The time to cecum=3 minutes 47 seconds.  Withdrawal time=17 minutes 31 seconds.  The scope was withdrawn and the procedure completed. COMPLICATIONS: There were no complications.  ENDOSCOPIC IMPRESSION: 1.   Five sessile polyps measuring 4-6 mm in size were found in the sigmoid colon  and rectosigmoid colon; Polypectomy was performed using cold snare 2.   There was mild diverticulosis noted in the sigmoid colon  RECOMMENDATIONS: 1.  Await pathology results 2.  High fiber diet 3.  Timing of repeat colonoscopy will be determined by pathology findings. 4.  You will receive a letter within 1-2 weeks with the results of your biopsy as well as final recommendations.  Please call my office if you have not received a letter after 3 weeks.   eSigned:  Jerene Bears, MD 06/28/2013 12:58 PM cc: The Patient and Arnette Norris MD

## 2013-06-28 NOTE — Progress Notes (Signed)
Called to room to assist during endoscopic procedure.  Patient ID and intended procedure confirmed with present staff. Received instructions for my participation in the procedure from the performing physician.  

## 2013-06-28 NOTE — Patient Instructions (Signed)
YOU HAD AN ENDOSCOPIC PROCEDURE TODAY AT THE [facility]: Refer to the procedure report that was given to you for any specific questions about what was found during the examination.  If the procedure report does not answer your questions, please call your gastroenterologist to clarify.  If you requested that your care partner not be given the details of your procedure findings, then the procedure report has been included in a sealed envelope for you to review at your convenience later.  YOU SHOULD EXPECT: Some feelings of bloating in the abdomen. Passage of more gas than usual.  Walking can help get rid of the air that was put into your GI tract during the procedure and reduce the bloating. If you had a lower endoscopy (such as a colonoscopy or flexible sigmoidoscopy) you may notice spotting of blood in your stool or on the toilet paper. If you underwent a bowel prep for your procedure, then you may not have a normal bowel movement for a few days.  DIET: Your first meal following the procedure should be a light meal and then it is ok to progress to your normal diet.  A half-sandwich or bowl of soup is an example of a good first meal.  Heavy or fried foods are harder to digest and may make you feel nauseous or bloated.  Likewise meals heavy in dairy and vegetables can cause extra gas to form and this can also increase the bloating.  Drink plenty of fluids but you should avoid alcoholic beverages for 24 hours.  ACTIVITY: Your care partner should take you home directly after the procedure.  You should plan to take it easy, moving slowly for the rest of the day.  You can resume normal activity the day after the procedure however you should NOT DRIVE or use heavy machinery for 24 hours (because of the sedation medicines used during the test).    SYMPTOMS TO REPORT IMMEDIATELY: A gastroenterologist can be reached at any hour.  During normal business hours, 8:30 AM to 5:00 PM Monday through Friday,  call [phone].  After hours and on weekends, please call the GI answering service at [phone] who will take a message and have the physician on call contact you.   Following lower endoscopy (colonoscopy or flexible sigmoidoscopy):  Excessive amounts of blood in the stool  Significant tenderness or worsening of abdominal pains  Swelling of the abdomen that is new, acute  Fever of 100F or higher   FOLLOW UP: If any biopsies were taken you will be contacted by phone or by letter within the next 1-3 weeks.  Call your gastroenterologist if you have not heard about the biopsies in 3 weeks.  Our staff will call the home number listed on your records the next business day following your procedure to check on you and address any questions or concerns that you may have at that time regarding the information given to you following your procedure. This is a courtesy call and so if there is no answer at the home number and we have not heard from you through the emergency physician on call, we will assume that you have returned to your regular daily activities without incident.  SIGNATURES/CONFIDENTIALITY: You and/or your care partner have signed paperwork which will be entered into your electronic medical record.  These signatures attest to the fact that that the information above on your After Visit Summary has been reviewed and is understood.  Full responsibility of the confidentiality of   this discharge information lies with you and/or your care-partner.   Information on polyps given to you today  Information on diverticulosis and high fiber diet given to you today

## 2013-06-28 NOTE — Progress Notes (Signed)
  [facility] Post-op Note  Patient: William Gibson  Procedure(s) Performed: colonoscopy  Patient Location: [facility] - Recovery Area  Anesthesia Type: Deep Sedation/Propofol  Level of Consciousness: awake, oriented and patient cooperative  Airway and Oxygen Therapy: Patient Spontanous Breathing  Post-op Pain: none  Post-op Assessment:  Post-op Vital signs reviewed, Patient's Cardiovascular Status Stable, Respiratory Function Stable, Patent Airway, No signs of Nausea or vomiting and Pain level controlled  Post-op Vital Signs: Reviewed and stable  Complications: No apparent anesthesia complications  Gurinder Toral E 1:03 PM

## 2013-06-29 ENCOUNTER — Telehealth: Payer: Self-pay

## 2013-06-29 NOTE — Telephone Encounter (Signed)
  Follow up Call-  Call back number 06/28/2013  Post procedure Call Back phone  # [phone]  Permission to leave phone message Yes     Patient questions:  Do you have a fever, pain , or abdominal swelling? no Pain Score  0 *  Have you tolerated food without any problems? yes  Have you been able to return to your normal activities? yes  Do you have any questions about your discharge instructions: Diet   no Medications  no Follow up visit  no  Do you have questions or concerns about your Care? no  Actions: * If pain score is 4 or above: No action needed, pain <4.

## 2013-07-04 ENCOUNTER — Encounter: Payer: Self-pay | Admitting: Internal Medicine

## 2014-05-07 DIAGNOSIS — H2511 Age-related nuclear cataract, right eye: Secondary | ICD-10-CM | POA: Diagnosis not present

## 2014-05-10 ENCOUNTER — Other Ambulatory Visit: Payer: Self-pay | Admitting: Internal Medicine

## 2014-05-10 DIAGNOSIS — Z0000 Encounter for general adult medical examination without abnormal findings: Secondary | ICD-10-CM

## 2014-05-17 ENCOUNTER — Other Ambulatory Visit (INDEPENDENT_AMBULATORY_CARE_PROVIDER_SITE_OTHER): Payer: 59

## 2014-05-17 DIAGNOSIS — Z0000 Encounter for general adult medical examination without abnormal findings: Secondary | ICD-10-CM

## 2014-05-17 LAB — LIPID PANEL
Cholesterol: 261 mg/dL — ABNORMAL HIGH (ref 0–200)
HDL: 68.8 mg/dL (ref >39.00)
LDL Cholesterol: 169 mg/dL — ABNORMAL HIGH (ref 0–99)
NONHDL: 192.2
Total CHOL/HDL Ratio: 4
Triglycerides: 117 mg/dL (ref 0.0–149.0)
VLDL: 23.4 mg/dL (ref 0.0–40.0)

## 2014-05-17 LAB — CBC
HEMATOCRIT: 41.4 % (ref 39.0–52.0)
Hemoglobin: 14.7 g/dL (ref 13.0–17.0)
MCHC: 35.6 g/dL (ref 30.0–36.0)
MCV: 98.7 fl (ref 78.0–100.0)
Platelets: 233 10*3/uL (ref 150.0–400.0)
RBC: 4.19 Mil/uL — ABNORMAL LOW (ref 4.22–5.81)
RDW: 13.3 % (ref 11.5–15.5)
WBC: 7.2 10*3/uL (ref 4.0–10.5)

## 2014-05-17 LAB — COMPREHENSIVE METABOLIC PANEL
ALBUMIN: 3.8 g/dL (ref 3.5–5.2)
ALT: 33 U/L (ref 0–53)
AST: 31 U/L (ref 0–37)
Alkaline Phosphatase: 80 U/L (ref 39–117)
BUN: 17 mg/dL (ref 6–23)
CHLORIDE: 107 meq/L (ref 96–112)
CO2: 29 mEq/L (ref 19–32)
CREATININE: 0.83 mg/dL (ref 0.40–1.50)
Calcium: 9.4 mg/dL (ref 8.4–10.5)
GFR: 97.63 mL/min (ref >60.00)
Glucose, Bld: 117 mg/dL — ABNORMAL HIGH (ref 70–99)
Potassium: 4.2 mEq/L (ref 3.5–5.1)
SODIUM: 141 meq/L (ref 135–145)
Total Bilirubin: 0.5 mg/dL (ref 0.2–1.2)
Total Protein: 7 g/dL (ref 6.0–8.3)

## 2014-05-17 LAB — PSA: PSA: 1.77 ng/mL (ref 0.10–4.00)

## 2014-05-23 ENCOUNTER — Ambulatory Visit (INDEPENDENT_AMBULATORY_CARE_PROVIDER_SITE_OTHER): Payer: 59 | Admitting: Family Medicine

## 2014-05-23 ENCOUNTER — Encounter: Payer: Self-pay | Admitting: Family Medicine

## 2014-05-23 VITALS — BP 138/86 | HR 79 | Temp 97.4°F | Ht 68.25 in | Wt 166.2 lb

## 2014-05-23 DIAGNOSIS — E7800 Pure hypercholesterolemia, unspecified: Secondary | ICD-10-CM

## 2014-05-23 DIAGNOSIS — K219 Gastro-esophageal reflux disease without esophagitis: Secondary | ICD-10-CM

## 2014-05-23 DIAGNOSIS — Z0000 Encounter for general adult medical examination without abnormal findings: Secondary | ICD-10-CM | POA: Insufficient documentation

## 2014-05-23 DIAGNOSIS — N529 Male erectile dysfunction, unspecified: Secondary | ICD-10-CM

## 2014-05-23 DIAGNOSIS — Z720 Tobacco use: Secondary | ICD-10-CM

## 2014-05-23 MED ORDER — OMEPRAZOLE 20 MG PO CPDR: 20.0000 mg | DELAYED_RELEASE_CAPSULE | Freq: Every day | ORAL | Status: DC

## 2014-05-23 MED ORDER — TADALAFIL 5 MG PO TABS: 5.0000 mg | ORAL_TABLET | Freq: Every day | ORAL | Status: DC | PRN

## 2014-05-23 NOTE — Progress Notes (Signed)
Subjective:    Patient ID: William Gibson, male    DOB: 06-Sep-1945, 69 y.o.   MRN: [id number]  HPI 69 yo pleasant male here for annual medicare wellness visit.  I have personally reviewed the Medicare Annual Wellness questionnaire and have noted 1. The patient's medical and social history 2. Their use of alcohol, tobacco or illicit drugs 3. Their current medications and supplements 4. The patient's functional ability including ADL's, fall risks, home safety risks and hearing or visual             impairment. 5. Diet and physical activities 6. Evidence for depression or mood disorders  End of life wishes discussed and updated in Social History.  The roster of all physicians providing medical care to patient - is listed in the Snapshot section of the chart. Colonoscopy 06/29/13- [facility]- 10 year recall  Had cataracts surgery on right 1/11, having left cataract removed next month- Dr.Beavis.  Now has 20/20 vision in right eye.  He is very pleased with results.  PMH significant for smoking 2 ppd (unfiltered) for over 50 years.  Has never quit smoking.  Not ready to quit.  ED- could not tolerate viagra.  Givens him indigestion and facial flushing.  He would like to try cialis.   Lab Results  Component Value Date   PSA 1.77 05/17/2014   PSA 1.45 05/15/2013   PSA 1.11 05/07/2012   Dad died of prostate CA in his 53s.   Lab Results  Component Value Date   WBC 7.2 05/17/2014   HGB 14.7 05/17/2014   HCT 41.4 05/17/2014   MCV 98.7 05/17/2014   PLT 233.0 05/17/2014   Elevated LDL- elevated since last year. Has been eating "pounds of sausage" per day.  Blood sugar elevated but had coke the morning he had blood work done.  Lab Results  Component Value Date   CHOL 261* 05/17/2014   HDL 68.80 05/17/2014   LDLCALC 169* 05/17/2014   LDLDIRECT 143.8 05/15/2013   TRIG 117.0 05/17/2014   CHOLHDL 4 05/17/2014     Patient Active Problem List   Diagnosis Date Noted  . Medicare annual  wellness visit, subsequent 05/23/2014  . GERD (gastroesophageal reflux disease) 05/23/2014  . Elevated LDL cholesterol level 05/22/2013  . Erectile dysfunction 05/22/2013  . Tobacco abuse 05/07/2012   No past medical history on file. Past Surgical History  Procedure Laterality Date  . Vasectomy    . Vasectomy reversal     History  Substance Use Topics  . Smoking status: Current Every Day Smoker -- 2.00 packs/day    Types: Cigarettes  . Smokeless tobacco: Never Used  . Alcohol Use: 16.8 oz/week    28 Shots of liquor per week     Comment: moderate   Family History  Problem Relation Age of Onset  . Colon cancer Neg Hx    No Known Allergies No current outpatient prescriptions on file prior to visit.   No current facility-administered medications on file prior to visit.   The PMH, PSH, Social History, Family History, Medications, and allergies have been reviewed in [facility], and have been updated if relevant.    Review of Systems  SOB has resolved Patient reports no  vision/ hearing changes,anorexia, weight change, fever ,adenopathy,  swallowing issues, chest pain, edema, hemoptysis, dyspnea(rest, exertional, paroxysmal nocturnal), gastrointestinal  bleeding (melena, rectal bleeding), abdominal pain, excessive heart burn, GU symptoms(dysuria, hematuria, pyuria, voiding/incontinence  Issues) syncope, focal weakness, severe memory loss, concerning skin lesions, depression, anxiety, abnormal  bruising/bleeding, major joint swelling.     Objective:   Physical Exam BP 138/86 mmHg  Pulse 79  Temp(Src) 97.4 F (36.3 C) (Oral)  Ht 5' 8.25" (1.734 m)  Wt 166 lb 4 oz (75.411 kg)  BMI 25.08 kg/m2  SpO2 97%  BP Readings from Last 3 Encounters:  05/23/14 138/86  06/28/13 129/84  05/22/13 116/84    Wt Readings from Last 3 Encounters:  05/23/14 166 lb 4 oz (75.411 kg)  06/28/13 155 lb (70.308 kg)  06/15/13 155 lb (70.308 kg)    General:  Thin male in NAD Eyes:  PERRL Ears:   External ear exam shows no significant lesions or deformities.  Otoscopic examination reveals clear canals, tympanic membranes are intact bilaterally without bulging, retraction, inflammation or discharge. Hearing is grossly normal bilaterally. Nose:  External nasal examination shows no deformity or inflammation. Nasal mucosa are inflammed, frontal sinuses mildly TTP Mouth:  Oral mucosa and oropharynx without lesions or exudates.  Teeth in good repair. Neck:  no carotid bruit or thyromegaly no cervical or supraclavicular lymphadenopathy  Lungs:  Normal respiratory effort, chest expands symmetrically.  Heart:  Normal rate and regular rhythm. S1 and S2 normal without gallop, murmur, click, rub or other extra sounds. Abdomen:  Bowel sounds positive,abdomen soft and non-tender without masses, organomegaly or hernias noted. Pulses:  R and L posterior tibial pulses are full and equal bilaterally  Extremities:  no edema      Assessment & Plan:

## 2014-05-23 NOTE — Assessment & Plan Note (Signed)
eRx sent for generic omeprazole.

## 2014-05-23 NOTE — Assessment & Plan Note (Signed)
Still not ready to quit 

## 2014-05-23 NOTE — Assessment & Plan Note (Signed)
Deteriorated. He wants to cut back on sausage and fatty foods, declining meds.

## 2014-05-23 NOTE — Assessment & Plan Note (Signed)
eRx sent for cialis although explained he may have similar side effects.

## 2014-05-23 NOTE — Assessment & Plan Note (Signed)
The patients weight, height, BMI and visual acuity have been recorded in the chart I have made referrals, counseling and provided education to the patient based review of the above and I have provided the pt with a written personalized care plan for preventive services.  Declining immunizations.

## 2014-05-23 NOTE — Patient Instructions (Signed)
Good to see you. Please cut back on sausage and alcohol. Come back in a few months for fasting lab work.

## 2014-05-23 NOTE — Progress Notes (Signed)
Pre visit review using our clinic review tool, if applicable. No additional management support is needed unless otherwise documented below in the visit note. 

## 2014-05-28 DIAGNOSIS — H25812 Combined forms of age-related cataract, left eye: Secondary | ICD-10-CM | POA: Diagnosis not present

## 2014-09-26 ENCOUNTER — Ambulatory Visit (INDEPENDENT_AMBULATORY_CARE_PROVIDER_SITE_OTHER)
Admission: RE | Admit: 2014-09-26 | Discharge: 2014-09-26 | Disposition: A | Discharge status: Home / Self Care | Payer: 59 | Source: Ambulatory Visit | Attending: Family Medicine | Admitting: Family Medicine

## 2014-09-26 ENCOUNTER — Encounter: Payer: Self-pay | Admitting: Family Medicine

## 2014-09-26 ENCOUNTER — Ambulatory Visit (INDEPENDENT_AMBULATORY_CARE_PROVIDER_SITE_OTHER): Payer: 59 | Admitting: Family Medicine

## 2014-09-26 VITALS — BP 134/82 | HR 82 | Temp 98.0°F | Wt 169.0 lb

## 2014-09-26 DIAGNOSIS — R600 Localized edema: Secondary | ICD-10-CM | POA: Diagnosis not present

## 2014-09-26 DIAGNOSIS — R0602 Shortness of breath: Secondary | ICD-10-CM

## 2014-09-26 LAB — COMPREHENSIVE METABOLIC PANEL
ALT: 34 U/L (ref 0–53)
AST: 37 U/L (ref 0–37)
Albumin: 3.9 g/dL (ref 3.5–5.2)
Alkaline Phosphatase: 82 U/L (ref 39–117)
BUN: 11 mg/dL (ref 6–23)
CHLORIDE: 101 meq/L (ref 96–112)
CO2: 28 mEq/L (ref 19–32)
Calcium: 9.3 mg/dL (ref 8.4–10.5)
Creatinine, Ser: 0.8 mg/dL (ref 0.40–1.50)
GFR: 101.75 mL/min (ref >60.00)
Glucose, Bld: 108 mg/dL — ABNORMAL HIGH (ref 70–99)
Potassium: 4.4 mEq/L (ref 3.5–5.1)
Sodium: 134 mEq/L — ABNORMAL LOW (ref 135–145)
TOTAL PROTEIN: 7.6 g/dL (ref 6.0–8.3)
Total Bilirubin: 0.7 mg/dL (ref 0.2–1.2)

## 2014-09-26 LAB — CBC WITH DIFFERENTIAL/PLATELET
BASOS ABS: 0.1 10*3/uL (ref 0.0–0.1)
BASOS PCT: 0.8 % (ref 0.0–3.0)
EOS ABS: 0.3 10*3/uL (ref 0.0–0.7)
Eosinophils Relative: 3.7 % (ref 0.0–5.0)
HEMATOCRIT: 44.3 % (ref 39.0–52.0)
HEMOGLOBIN: 14.9 g/dL (ref 13.0–17.0)
Lymphocytes Relative: 25.7 % (ref 12.0–46.0)
Lymphs Abs: 1.9 10*3/uL (ref 0.7–4.0)
MCHC: 33.7 g/dL (ref 30.0–36.0)
MCV: 98.6 fl (ref 78.0–100.0)
MONO ABS: 0.8 10*3/uL (ref 0.1–1.0)
Monocytes Relative: 10.7 % (ref 3.0–12.0)
Neutro Abs: 4.3 10*3/uL (ref 1.4–7.7)
Neutrophils Relative %: 59.1 % (ref 43.0–77.0)
Platelets: 222 10*3/uL (ref 150.0–400.0)
RBC: 4.49 Mil/uL (ref 4.22–5.81)
RDW: 14 % (ref 11.5–15.5)
WBC: 7.3 10*3/uL (ref 4.0–10.5)

## 2014-09-26 LAB — BRAIN NATRIURETIC PEPTIDE: PRO B NATRI PEPTIDE: 54 pg/mL (ref 0.0–100.0)

## 2014-09-26 LAB — TSH: TSH: 2.15 u[IU]/mL (ref 0.35–4.50)

## 2014-09-26 LAB — T4, FREE: FREE T4: 0.7 ng/dL (ref 0.60–1.60)

## 2014-09-26 NOTE — Assessment & Plan Note (Signed)
New- likely dependent edema aggravated by increase salt intake and decreased physical activity. Advised to keep legs elevated at all times while seated. Check labs and chest  xray today. Consider compression hose. Cardiology referral if symptoms continue. No diuretics given as symptoms quite mild at this point. Call or return to clinic prn if these symptoms worsen or fail to improve as anticipated. The patient indicates understanding of these issues and agrees with the plan. Orders Placed This Encounter  Procedures  . DG Chest 2 View  . TSH  . T4, Free  . CBC with Differential/Platelet  . Comprehensive metabolic panel  . Brain natriuretic peptide

## 2014-09-26 NOTE — Progress Notes (Signed)
Pre visit review using our clinic review tool, if applicable. No additional management support is needed unless otherwise documented below in the visit note. 

## 2014-09-26 NOTE — Patient Instructions (Signed)
Great to see you. Keep your legs elevated when you are seated.  I will call you with your xray and labs results.

## 2014-09-26 NOTE — Progress Notes (Signed)
Subjective:   Patient ID: William Gibson, male    DOB: [birth year], 69 y.o.   MRN: [id number]  William Gibson is a pleasant 69 y.o. year old male with history of tobacco use who presents to clinic today with Edema  on 09/26/2014  HPI:  LE edema- noticed it start about a month ago.  End of the day, feet and ankles swollen, left > right.  Swelling is almost always gone by the time he wakes up in the morning.  Can be painful but usually it is not. He is not as active as he used to be now that he is no longer working for the same company. No CP.  Upon questioning, he has noticed some SOB and DOE but not "severe."  "I think I am just out of shape."  Still smoking.  Lab Results  Component Value Date   CHOL 261* 05/17/2014   HDL 68.80 05/17/2014   LDLCALC 169* 05/17/2014   LDLDIRECT 143.8 05/15/2013   TRIG 117.0 05/17/2014   CHOLHDL 4 05/17/2014   No results found for: TSH Lab Results  Component Value Date   WBC 7.2 05/17/2014   HGB 14.7 05/17/2014   HCT 41.4 05/17/2014   MCV 98.7 05/17/2014   PLT 233.0 05/17/2014   Has gained a few pounds but eating more and less active.  "I love salt!"  Wt Readings from Last 3 Encounters:  09/26/14 169 lb (76.658 kg)  05/23/14 166 lb 4 oz (75.411 kg)  06/28/13 155 lb (70.308 kg)   Current Outpatient Prescriptions on File Prior to Visit  Medication Sig Dispense Refill  . omeprazole (PRILOSEC) 20 MG capsule Take 1 capsule (20 mg total) by mouth daily. 90 capsule 3   No current facility-administered medications on file prior to visit.    No Known Allergies  No past medical history on file.  Past Surgical History  Procedure Laterality Date  . Vasectomy    . Vasectomy reversal      Family History  Problem Relation Age of Onset  . Colon cancer Neg Hx     History   Social History  . Marital Status: Married    Spouse Name: N/A  . Number of Children: N/A  . Years of Education: N/A   Occupational History  . Not on file.    Social History Main Topics  . Smoking status: Current Every Day Smoker -- 2.00 packs/day    Types: Cigarettes  . Smokeless tobacco: Never Used  . Alcohol Use: 16.8 oz/week    28 Shots of liquor per week     Comment: moderate  . Drug Use: No  . Sexual Activity: Not on file   Other Topics Concern  . Not on file   Social History Narrative   Does not have a living will.   Desires CPR, does not want prolonged life support if futile.            The PMH, PSH, Social History, Family History, Medications, and allergies have been reviewed in [facility], and have been updated if relevant.   Review of Systems  Constitutional: Positive for fatigue. Negative for unexpected weight change.  HENT: Negative.   Respiratory: Positive for shortness of breath. Negative for apnea, choking, chest tightness, wheezing and stridor.   Cardiovascular: Positive for leg swelling. Negative for chest pain and palpitations.  Gastrointestinal: Negative.   Endocrine: Negative.   Genitourinary: Negative.   Musculoskeletal: Negative.   Skin: Negative.   Neurological: Negative.  Hematological: Negative.   Psychiatric/Behavioral: Negative.   All other systems reviewed and are negative.      Objective:    BP 134/82 mmHg  Pulse 82  Temp(Src) 98 F (36.7 C) (Oral)  Wt 169 lb (76.658 kg)  SpO2 96%   Physical Exam  Constitutional: He is oriented to person, place, and time. He appears well-developed and well-nourished. No distress.  HENT:  Head: Normocephalic and atraumatic.  Eyes: Conjunctivae are normal.  Cardiovascular: Normal rate, regular rhythm and normal heart sounds.   Pulmonary/Chest: Effort normal and breath sounds normal. No respiratory distress. He has no wheezes. He has no rales. He exhibits no tenderness.  Musculoskeletal: Normal range of motion.  Trace edema left LE, no edema right LE  Neurological: He is alert and oriented to person, place, and time. No cranial nerve deficit.  Skin: Skin  is warm and dry. No erythema.  Nursing note and vitals reviewed.         Assessment & Plan:   Shortness of breath - Plan: TSH, T4, Free, CBC with Differential/Platelet, Comprehensive metabolic panel, DG Chest 2 View  Bilateral edema of lower extremity No Follow-up on file.

## 2014-10-01 ENCOUNTER — Telehealth: Payer: Self-pay | Admitting: Family Medicine

## 2014-10-01 NOTE — Telephone Encounter (Signed)
Opened in error

## 2014-10-23 ENCOUNTER — Telehealth: Payer: Self-pay

## 2014-10-23 DIAGNOSIS — N529 Male erectile dysfunction, unspecified: Secondary | ICD-10-CM

## 2014-10-23 NOTE — Telephone Encounter (Signed)
Pt wants to know if can get urology referral; viagra and cialis are not effective and cause pt to have heartburn and elevated BP. Pt request cb.

## 2014-10-23 NOTE — Telephone Encounter (Signed)
Referral placed.

## 2014-11-06 ENCOUNTER — Other Ambulatory Visit: Payer: Self-pay | Admitting: Family Medicine

## 2014-11-07 NOTE — Telephone Encounter (Signed)
Ok to refill? Not on current med list. 

## 2015-04-17 ENCOUNTER — Ambulatory Visit (INDEPENDENT_AMBULATORY_CARE_PROVIDER_SITE_OTHER): Payer: 59 | Admitting: Primary Care

## 2015-04-17 ENCOUNTER — Encounter: Payer: Self-pay | Admitting: Primary Care

## 2015-04-17 VITALS — BP 152/84 | HR 89 | Temp 97.3°F | Ht 68.25 in | Wt 162.4 lb

## 2015-04-17 DIAGNOSIS — M2540 Effusion, unspecified joint: Secondary | ICD-10-CM | POA: Diagnosis not present

## 2015-04-17 DIAGNOSIS — M2550 Pain in unspecified joint: Secondary | ICD-10-CM

## 2015-04-17 MED ORDER — DICLOFENAC SODIUM 50 MG PO TBEC: 50.0000 mg | DELAYED_RELEASE_TABLET | Freq: Three times a day (TID) | ORAL | Status: DC | PRN

## 2015-04-17 NOTE — Patient Instructions (Signed)
Stop taking Aleve and Advil. Start Diclofenac tablets for joint swelling and pain. Take 1 tablet by mouth as needed for pain and inflammation.  Complete lab work prior to leaving today. I will notify you of your results once received.   It was a pleasure meeting you!

## 2015-04-17 NOTE — Progress Notes (Signed)
Pre visit review using our clinic review tool, if applicable. No additional management support is needed unless otherwise documented below in the visit note. 

## 2015-04-17 NOTE — Progress Notes (Signed)
Subjective:    Patient ID: William Gibson, male    DOB: 07-Jan-1946, 69 y.o.   MRN: [id number]  HPI  [name] is a 69 year male who presents today with a chief complaint of extremity pain. He has a history of extremity swelling and pain in the past. He reports right foot stiffness, swelling, and pain. He also reports edema and swelling to bilateral hands, especially to his joints. His symptoms have been intermittent for 6+ months.   His feet and hands will ache as soon as he wakes up, "feels like a bad toothache to my feet and hands", with decrease in ROM and mobility for the first 30 minutes. 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 He's been taking aleve and advil, without much improvement. His pain became extreme yesterday.   Review of Systems  Constitutional: Negative for fever.  Musculoskeletal: Positive for joint swelling and arthralgias.  Neurological: Negative for headaches.       No past medical history on file.  Social History   Social History  . Marital Status: Married    Spouse Name: N/A  . Number of Children: N/A  . Years of Education: N/A   Occupational History  . Not on file.   Social History Main Topics  . Smoking status: Current Every Day Smoker -- 2.00 packs/day    Types: Cigarettes  . Smokeless tobacco: Never Used  . Alcohol Use: 16.8 oz/week    28 Shots of liquor per week     Comment: moderate  . Drug Use: No  . Sexual Activity: Not on file   Other Topics Concern  . Not on file   Social History Narrative   Does not have a living will.   Desires CPR, does not want prolonged life support if futile.             Past Surgical History  Procedure Laterality Date  . Vasectomy    . Vasectomy reversal      Family History  Problem Relation Age of Onset  . Colon cancer Neg Hx     No Known Allergies  Current Outpatient Prescriptions on File Prior to Visit  Medication Sig Dispense Refill   . omeprazole (PRILOSEC) 20 MG capsule Take 1 capsule (20 mg total) by mouth daily. 90 capsule 3   No current facility-administered medications on file prior to visit.    BP 152/84 mmHg  Pulse 89  Temp(Src) 97.3 F (36.3 C) (Oral)  Ht 5' 8.25" (1.734 m)  Wt 162 lb 6.4 oz (73.664 kg)  BMI 24.50 kg/m2  SpO2 98%    Objective:   Physical Exam  Constitutional: He appears well-nourished.  Cardiovascular: Normal rate and regular rhythm.   Pulmonary/Chest: Effort normal and breath sounds normal.  Musculoskeletal:       Right ankle: He exhibits decreased range of motion and swelling. He exhibits no deformity.       Right hand: He exhibits decreased range of motion and swelling. He exhibits no deformity.       Left hand: He exhibits decreased range of motion and swelling. He exhibits no deformity.  Significant swelling to metacarpal, PIP, DIP joints to bilateral hands. Decrease in ROM. Moderate pain upon ROM to right foot. Moderate swelling to right ankle. No involvement to toes.  Skin: Skin is warm and dry. There is erythema.          Assessment &  Plan:  Arthralgias:   Moderate swelling to multiple joints to bilateral hands with decrease in ROM. Moderate swelling and decrease in ROM to right ankle. Intermittently present for patst 6+ months. Works with hands daily. Will obtain RF, ANA, ESR, Uric Acid, CBC, CMP to rule out autoimmune or gout causes. Could also be arthritis due to repetitive movement with hands. RX for diclofenac provided to use PRN pain and inflammation. He will stop aleve and advil. Labs pending, will update patient once received. If positive, will send to rheumatology.

## 2015-04-18 ENCOUNTER — Telehealth: Payer: Self-pay | Admitting: Primary Care

## 2015-04-18 ENCOUNTER — Other Ambulatory Visit: Payer: Self-pay | Admitting: Primary Care

## 2015-04-18 DIAGNOSIS — M2540 Effusion, unspecified joint: Secondary | ICD-10-CM

## 2015-04-18 LAB — CBC
Hematocrit: 40.6 % (ref 37.5–51.0)
Hemoglobin: 13.8 g/dL (ref 12.6–17.7)
MCH: 33.7 pg — ABNORMAL HIGH (ref 26.6–33.0)
MCHC: 34 g/dL (ref 31.5–35.7)
MCV: 99 fL — ABNORMAL HIGH (ref 79–97)
Platelets: 254 10*3/uL (ref 150–379)
RBC: 4.09 x10E6/uL — ABNORMAL LOW (ref 4.14–5.80)
RDW: 13.5 % (ref 12.3–15.4)
WBC: 8.8 10*3/uL (ref 3.4–10.8)

## 2015-04-18 LAB — COMPREHENSIVE METABOLIC PANEL
A/G RATIO: 1.1 (ref 1.1–2.5)
ALBUMIN: 3.7 g/dL (ref 3.6–4.8)
ALT: 16 IU/L (ref 0–44)
AST: 19 IU/L (ref 0–40)
Alkaline Phosphatase: 100 IU/L (ref 39–117)
BILIRUBIN TOTAL: 0.6 mg/dL (ref 0.0–1.2)
BUN / CREAT RATIO: 16 (ref 10–22)
BUN: 13 mg/dL (ref 8–27)
CHLORIDE: 98 mmol/L (ref 96–106)
CO2: 26 mmol/L (ref 18–29)
Calcium: 9.6 mg/dL (ref 8.6–10.2)
Creatinine, Ser: 0.81 mg/dL (ref 0.76–1.27)
GFR calc Af Amer: 105 mL/min/{1.73_m2} (ref >59)
GFR calc non Af Amer: 91 mL/min/{1.73_m2} (ref >59)
Globulin, Total: 3.3 g/dL (ref 1.5–4.5)
Glucose: 111 mg/dL — ABNORMAL HIGH (ref 65–99)
Potassium: 4.5 mmol/L (ref 3.5–5.2)
SODIUM: 139 mmol/L (ref 134–144)
TOTAL PROTEIN: 7 g/dL (ref 6.0–8.5)

## 2015-04-18 LAB — SEDIMENTATION RATE: Sed Rate: 64 mm/hr — ABNORMAL HIGH (ref 0–30)

## 2015-04-18 LAB — RHEUMATOID FACTOR: Rhuematoid fact SerPl-aCnc: >650 IU/mL — ABNORMAL HIGH (ref 0.0–13.9)

## 2015-04-18 LAB — ANA: Anti Nuclear Antibody(ANA): NEGATIVE

## 2015-04-18 LAB — URIC ACID: Uric Acid: 5.5 mg/dL (ref 3.7–8.6)

## 2015-04-18 MED ORDER — PREDNISONE 20 MG PO TABS: ORAL_TABLET | ORAL | Status: DC

## 2015-04-18 NOTE — Telephone Encounter (Signed)
Called patient and spoke with wife, husband was at work. DPR signed so gave results to wife and will call Rheum to get an urgent appt. Patients wife says that the diclofenac isnt helping his pain at all. He has been up all night for the past two nights. Can you please call in something else for the pain or add something for the pain. Please call patients wife or patient on his cell# [phone]

## 2015-04-18 NOTE — Telephone Encounter (Signed)
Called and notified patient's wife of Kate's comments. Patient's wife verbalized understanding.  

## 2015-04-18 NOTE — Telephone Encounter (Signed)
Please notify William Gibson that I've sent in a script for prednisone. He is to start immediately. He will take 3 tablets by mouth for 3 days, then 2 tablets for 3 days, then 1 tablet for 3 days. This should help reduce swelling and pain.

## 2015-08-05 DIAGNOSIS — M0579 Rheumatoid arthritis with rheumatoid factor of multiple sites without organ or systems involvement: Secondary | ICD-10-CM | POA: Diagnosis not present

## 2015-08-20 ENCOUNTER — Ambulatory Visit (INDEPENDENT_AMBULATORY_CARE_PROVIDER_SITE_OTHER): Payer: 59 | Admitting: Internal Medicine

## 2015-08-20 ENCOUNTER — Encounter: Payer: Self-pay | Admitting: Internal Medicine

## 2015-08-20 VITALS — BP 128/82 | HR 92 | Temp 97.6°F | Wt 168.0 lb

## 2015-08-20 DIAGNOSIS — Z91048 Other nonmedicinal substance allergy status: Secondary | ICD-10-CM

## 2015-08-20 DIAGNOSIS — Z9109 Other allergy status, other than to drugs and biological substances: Secondary | ICD-10-CM

## 2015-08-20 NOTE — Patient Instructions (Signed)

## 2015-08-20 NOTE — Progress Notes (Signed)
HPI  Pt presents to the clinic today with c/o cough and chest congestion. This started a few weeks ago. The cough is productive of clear mucous. He denies headache, runny nose, nasal congestion or shortness of breath. He reports he has a h/o seasonal allergies and he normally lets it "run its course" but his daughter made him the apt today. He has not had sick contacts.   Review of Systems   History reviewed. No pertinent past medical history.  Family History  Problem Relation Age of Onset  . Colon cancer Neg Hx     Social History   Social History  . Marital Status: Married    Spouse Name: N/A  . Number of Children: N/A  . Years of Education: N/A   Occupational History  . Not on file.   Social History Main Topics  . Smoking status: Current Every Day Smoker -- 2.00 packs/day    Types: Cigarettes  . Smokeless tobacco: Never Used  . Alcohol Use: 16.8 oz/week    28 Shots of liquor per week     Comment: moderate  . Drug Use: No  . Sexual Activity: Not on file   Other Topics Concern  . Not on file   Social History Narrative   Does not have a living will.   Desires CPR, does not want prolonged life support if futile.             No Known Allergies   Constitutional:  Denies headache, fatigue, fever or abrupt weight changes.  HEENT:  Positive sore throat. Denies eye redness, ear pain, ringing in the ears, wax buildup, runny nose or bloody nose. Respiratory: Positive cough. Denies difficulty breathing or shortness of breath.  Cardiovascular: Denies chest pain, chest tightness, palpitations or swelling in the hands or feet.   No other specific complaints in a complete review of systems (except as listed in HPI above).  Objective:  BP 128/82 mmHg  Pulse 92  Temp(Src) 97.6 F (36.4 C) (Oral)  Wt 168 lb (76.204 kg)  SpO2 95%   General: Appears his stated age, well developed, well nourished in NAD. HEENT: Head: normal shape and size, no sinus tenderness noted; Eyes:  sclera white, no icterus, conjunctiva pink; Ears: Tm's gray and intact, normal light reflex; Nose: mucosa pink and moist, septum midline; Throat/Mouth: + PND and erythematous. Teeth present, mucosa erythematous and moist, no exudate noted, no lesions or ulcerations noted.  Neck:  No adenopathy noted.  Cardiovascular: Normal rate and rhythm. S1,S2 noted.  No murmur, rubs or gallops noted.  Pulmonary/Chest: Normal effort and positive vesicular breath sounds. No respiratory distress. No wheezes, rales or ronchi noted.      Assessment & Plan:   Cough due to allergies  Can use OTC allergy medicines prn No indication for abx  RTC as needed or if symptoms persist.

## 2015-08-20 NOTE — Progress Notes (Signed)
Pre visit review using our clinic review tool, if applicable. No additional management support is needed unless otherwise documented below in the visit note. 

## 2015-08-21 ENCOUNTER — Ambulatory Visit: Payer: 59 | Admitting: Primary Care

## 2016-01-16 ENCOUNTER — Other Ambulatory Visit: Payer: Self-pay | Admitting: Family Medicine

## 2016-01-16 DIAGNOSIS — Z0000 Encounter for general adult medical examination without abnormal findings: Secondary | ICD-10-CM | POA: Insufficient documentation

## 2016-01-23 ENCOUNTER — Other Ambulatory Visit (INDEPENDENT_AMBULATORY_CARE_PROVIDER_SITE_OTHER): Payer: 59

## 2016-01-23 DIAGNOSIS — Z0000 Encounter for general adult medical examination without abnormal findings: Secondary | ICD-10-CM | POA: Diagnosis not present

## 2016-01-23 LAB — COMPREHENSIVE METABOLIC PANEL
ALBUMIN: 3.5 g/dL (ref 3.5–5.2)
ALT: 41 U/L (ref 0–53)
AST: 44 U/L — ABNORMAL HIGH (ref 0–37)
Alkaline Phosphatase: 87 U/L (ref 39–117)
BILIRUBIN TOTAL: 0.8 mg/dL (ref 0.2–1.2)
BUN: 18 mg/dL (ref 6–23)
CALCIUM: 9.2 mg/dL (ref 8.4–10.5)
CO2: 29 mEq/L (ref 19–32)
CREATININE: 0.78 mg/dL (ref 0.40–1.50)
Chloride: 102 mEq/L (ref 96–112)
GFR: 104.37 mL/min (ref >60.00)
Glucose, Bld: 99 mg/dL (ref 70–99)
Potassium: 4.1 mEq/L (ref 3.5–5.1)
Sodium: 139 mEq/L (ref 135–145)
Total Protein: 7.2 g/dL (ref 6.0–8.3)

## 2016-01-23 LAB — LIPID PANEL
Cholesterol: 249 mg/dL — ABNORMAL HIGH (ref 0–200)
HDL: 86.7 mg/dL (ref >39.00)
LDL Cholesterol: 141 mg/dL — ABNORMAL HIGH (ref 0–99)
NonHDL: 161.85
TRIGLYCERIDES: 104 mg/dL (ref 0.0–149.0)
Total CHOL/HDL Ratio: 3
VLDL: 20.8 mg/dL (ref 0.0–40.0)

## 2016-01-23 LAB — PSA: PSA: 2.35 ng/mL (ref 0.10–4.00)

## 2016-01-30 ENCOUNTER — Encounter: Payer: Self-pay | Admitting: Family Medicine

## 2016-01-30 ENCOUNTER — Ambulatory Visit (INDEPENDENT_AMBULATORY_CARE_PROVIDER_SITE_OTHER): Payer: 59 | Admitting: Family Medicine

## 2016-01-30 VITALS — BP 130/92 | HR 108 | Temp 97.8°F | Ht 68.5 in | Wt 167.2 lb

## 2016-01-30 DIAGNOSIS — Z0000 Encounter for general adult medical examination without abnormal findings: Secondary | ICD-10-CM | POA: Insufficient documentation

## 2016-01-30 DIAGNOSIS — E7800 Pure hypercholesterolemia, unspecified: Secondary | ICD-10-CM | POA: Diagnosis not present

## 2016-01-30 DIAGNOSIS — Z720 Tobacco use: Secondary | ICD-10-CM

## 2016-01-30 DIAGNOSIS — M159 Polyosteoarthritis, unspecified: Secondary | ICD-10-CM | POA: Diagnosis not present

## 2016-01-30 DIAGNOSIS — K219 Gastro-esophageal reflux disease without esophagitis: Secondary | ICD-10-CM | POA: Diagnosis not present

## 2016-01-30 DIAGNOSIS — M1990 Unspecified osteoarthritis, unspecified site: Secondary | ICD-10-CM | POA: Insufficient documentation

## 2016-01-30 NOTE — Progress Notes (Signed)
Subjective:    Patient ID: William Gibson, male    DOB: [birth year], 70 y.o.   MRN: [id number]  HPI 70 yo pleasant male here for annual medicare wellness visit.  I have personally reviewed the Medicare Annual Wellness questionnaire and have noted 1. The patient's medical and social history 2. Their use of alcohol, tobacco or illicit drugs 3. Their current medications and supplements 4. The patient's functional ability including ADL's, fall risks, home safety risks and hearing or visual             impairment. 5. Diet and physical activities 6. Evidence for depression or mood disorders  End of life wishes discussed and updated in Social History.  The roster of all physicians providing medical care to patient - is listed in the Snapshot section of the chart. Colonoscopy 06/29/13- [facility]- 10 year recall  Overall doing well.  Retired now.  Very active, caring for his 33 year old grandson and really enjoying it.  Arthritis- hands, feet, back.  Has been followed by rheumatology.  Currently taking meloxicam and as needed prednisone.  Humira eRx was sent but he never started it because of the side effects.  PMH significant for smoking 2 ppd for over 50 years.  Has never quit smoking.  Not ready to quit.   Lab Results  Component Value Date   PSA 2.35 01/23/2016   PSA 1.77 05/17/2014   PSA 1.45 2013/05/24   Dad died of prostate CA in his 40s.   Lab Results  Component Value Date   WBC 8.8 04/17/2015   HGB 14.9 09/26/2014   HCT 40.6 04/17/2015   MCV 99 (H) 04/17/2015   PLT 254 04/17/2015    Lab Results  Component Value Date   CHOL 249 (H) 01/23/2016   HDL 86.70 01/23/2016   LDLCALC 141 (H) 01/23/2016   LDLDIRECT 143.8 05-24-2013   TRIG 104.0 01/23/2016   CHOLHDL 3 01/23/2016     Patient Active Problem List  Diagnosis  . Tobacco abuse  . Elevated LDL cholesterol level  . Erectile dysfunction  . GERD (gastroesophageal reflux disease)  . Leg edema  . Visit for well man  health check  . Medicare annual wellness visit, subsequent  . Osteoarthritis   No past medical history on file. Past Surgical History:  Procedure Laterality Date  . VASECTOMY    . VASECTOMY REVERSAL     Social History  Substance Use Topics  . Smoking status: Current Every Day Smoker    Packs/day: 2.00    Types: Cigarettes  . Smokeless tobacco: Never Used  . Alcohol use 16.8 oz/week    28 Shots of liquor per week     Comment: moderate   Family History  Problem Relation Age of Onset  . Colon cancer Neg Hx    No Known Allergies Current Outpatient Prescriptions on File Prior to Visit  Medication Sig Dispense Refill  . meloxicam (MOBIC) 15 MG tablet Take 15 mg by mouth daily.  2  . omeprazole (PRILOSEC) 20 MG capsule Take 1 capsule (20 mg total) by mouth daily. 90 capsule 3  . predniSONE (DELTASONE) 5 MG tablet Take 1 tablet by mouth.  0   No current facility-administered medications on file prior to visit.    The PMH, PSH, Social History, Family History, Medications, and allergies have been reviewed in [facility], and have been updated if relevant.    Review of Systems  Constitutional: Negative.   HENT: Negative.   Eyes: Negative.  Respiratory: Negative.   Cardiovascular: Negative.   Gastrointestinal: Negative.   Endocrine: Negative.   Genitourinary: Negative.   Musculoskeletal: Positive for arthralgias.  Allergic/Immunologic: Negative.   Neurological: Negative.   Hematological: Negative.   Psychiatric/Behavioral: Negative.   All other systems reviewed and are negative.      Objective:   Physical Exam BP (!) 130/92   Pulse (!) 108   Temp 97.8 F (36.6 C) (Oral)   Ht 5' 8.5" (1.74 m)   Wt 167 lb 4 oz (75.9 kg)   SpO2 98%   BMI 25.06 kg/m   BP Readings from Last 3 Encounters:  01/30/16 (!) 130/92  08/20/15 128/82  04/17/15 (!) 152/84    Wt Readings from Last 3 Encounters:  01/30/16 167 lb 4 oz (75.9 kg)  08/20/15 168 lb (76.2 kg)  04/17/15 162 lb 6.4 oz  (73.7 kg)    General:  Thin male in NAD Eyes:  PERRL Ears:  External ear exam shows no significant lesions or deformities.  Otoscopic examination reveals clear canals, tympanic membranes are intact bilaterally without bulging, retraction, inflammation or discharge. Hearing is grossly normal bilaterally. Nose:  External nasal examination shows no deformity or inflammation. Nasal mucosa are inflammed, frontal sinuses mildly TTP Mouth:  Oral mucosa and oropharynx without lesions or exudates.  Teeth in good repair. Neck:  no carotid bruit or thyromegaly no cervical or supraclavicular lymphadenopathy  Lungs:  Normal respiratory effort, chest expands symmetrically.  Heart:  Normal rate and regular rhythm. S1 and S2 normal without gallop, murmur, click, rub or other extra sounds. Abdomen:  Bowel sounds positive,abdomen soft and non-tender without masses, organomegaly or hernias noted. Pulses:  R and L posterior tibial pulses are full and equal bilaterally  Extremities:  no edema  MSK:  Swollen dips/pips, swan neck deformity     Assessment & Plan:

## 2016-01-30 NOTE — Progress Notes (Signed)
Pre visit review using our clinic review tool, if applicable. No additional management support is needed unless otherwise documented below in the visit note. 

## 2016-01-30 NOTE — Assessment & Plan Note (Signed)
The patients weight, height, BMI and visual acuity have been recorded in the chart.  Cognitive function assessed.   I have made referrals, counseling and provided education to the patient based review of the above and I have provided the pt with a written personalized care plan for preventive services.  Declines influenza vaccine

## 2016-01-30 NOTE — Assessment & Plan Note (Signed)
Improving.

## 2016-01-30 NOTE — Assessment & Plan Note (Signed)
Remains not ready to quit.

## 2016-01-30 NOTE — Assessment & Plan Note (Signed)
Deteriorated.  Now followed by rheumatology.

## 2016-02-04 ENCOUNTER — Telehealth: Payer: Self-pay

## 2016-02-04 MED ORDER — SILDENAFIL CITRATE 100 MG PO TABS: 50.0000 mg | ORAL_TABLET | Freq: Every day | ORAL | 11 refills | Status: DC | PRN

## 2016-02-04 NOTE — Telephone Encounter (Signed)
Pt left v/m requesting viagra sent to [facility]. Pt last seen annual 01/30/16.Please advise.

## 2016-02-04 NOTE — Telephone Encounter (Signed)
eRx sent

## 2016-05-06 DIAGNOSIS — E663 Overweight: Secondary | ICD-10-CM | POA: Diagnosis not present

## 2016-05-06 DIAGNOSIS — Z79899 Other long term (current) drug therapy: Secondary | ICD-10-CM | POA: Diagnosis not present

## 2016-05-06 DIAGNOSIS — Z6825 Body mass index (BMI) 25.0-25.9, adult: Secondary | ICD-10-CM | POA: Diagnosis not present

## 2016-05-06 DIAGNOSIS — M0579 Rheumatoid arthritis with rheumatoid factor of multiple sites without organ or systems involvement: Secondary | ICD-10-CM | POA: Diagnosis not present

## 2016-05-06 DIAGNOSIS — M150 Primary generalized (osteo)arthritis: Secondary | ICD-10-CM | POA: Diagnosis not present

## 2016-05-06 DIAGNOSIS — M2550 Pain in unspecified joint: Secondary | ICD-10-CM | POA: Diagnosis not present

## 2016-06-05 ENCOUNTER — Telehealth: Payer: Self-pay | Admitting: *Deleted

## 2016-06-05 NOTE — Telephone Encounter (Signed)
Per Dr Deborra Medina, "Please call pt. We can fill out without an appt "

## 2016-06-09 ENCOUNTER — Telehealth: Payer: Self-pay | Admitting: *Deleted

## 2016-06-09 NOTE — Telephone Encounter (Signed)
PT brought in a physical form to be completed. Please fax for to [phone]. Form placed in Rx tower.

## 2016-06-12 NOTE — Telephone Encounter (Signed)
Completed form faxed as requested

## 2016-07-16 ENCOUNTER — Encounter (INDEPENDENT_AMBULATORY_CARE_PROVIDER_SITE_OTHER): Payer: Self-pay

## 2016-07-16 ENCOUNTER — Ambulatory Visit (INDEPENDENT_AMBULATORY_CARE_PROVIDER_SITE_OTHER): Payer: 59 | Admitting: Family Medicine

## 2016-07-16 ENCOUNTER — Encounter: Payer: Self-pay | Admitting: Family Medicine

## 2016-07-16 VITALS — BP 122/86 | HR 96 | Temp 97.2°F | Ht 69.0 in | Wt 166.0 lb

## 2016-07-16 DIAGNOSIS — R21 Rash and other nonspecific skin eruption: Secondary | ICD-10-CM | POA: Diagnosis not present

## 2016-07-16 DIAGNOSIS — M159 Polyosteoarthritis, unspecified: Secondary | ICD-10-CM | POA: Diagnosis not present

## 2016-07-16 NOTE — Patient Instructions (Signed)
Great to see you.  Please call your rheumatologist to tell them that I think this rash is related to Humira.

## 2016-07-16 NOTE — Assessment & Plan Note (Signed)
New- most consistent with a reaction to Humira. Less likely psoriasis based on distribution. Advised letting his rheumatologist know. The patient indicates understanding of these issues and agrees with the plan.

## 2016-07-16 NOTE — Progress Notes (Signed)
   Subjective:   Patient ID: William Gibson, male    DOB: [birth year], 71 y.o.   MRN: [id number]  William Gibson is a pleasant 71 y.o. year old male who presents to clinic today with Rash (Pt stated rash around stomach/legs redness 3 months)  on 07/16/2016  HPI:  Itchy rash on legs and stomach for 3 months--started shortly after starting Humira for arthritis. Humira has helped with his arthritis.  Puts lotion on it which makes it "look less flaky" but doesn't really help.  Current Outpatient Prescriptions on File Prior to Visit  Medication Sig Dispense Refill  . meloxicam (MOBIC) 15 MG tablet Take 15 mg by mouth daily.  2  . omeprazole (PRILOSEC) 20 MG capsule Take 1 capsule (20 mg total) by mouth daily. 90 capsule 3  . predniSONE (DELTASONE) 5 MG tablet Take 1 tablet by mouth.  0   No current facility-administered medications on file prior to visit.     No Known Allergies  No past medical history on file.  Past Surgical History:  Procedure Laterality Date  . VASECTOMY    . VASECTOMY REVERSAL      Family History  Problem Relation Age of Onset  . Colon cancer Neg Hx     Social History   Social History  . Marital status: Married    Spouse name: N/A  . Number of children: N/A  . Years of education: N/A   Occupational History  . Not on file.   Social History Main Topics  . Smoking status: Current Every Day Smoker    Packs/day: 2.00    Types: Cigarettes  . Smokeless tobacco: Never Used  . Alcohol use 16.8 oz/week    28 Shots of liquor per week     Comment: moderate  . Drug use: No  . Sexual activity: Not on file   Other Topics Concern  . Not on file   Social History Narrative   Does not have a living will.   Desires CPR, does not want prolonged life support if futile.            The PMH, PSH, Social History, Family History, Medications, and allergies have been reviewed in [facility], and have been updated if relevant.   Review of Systems  Musculoskeletal:  Positive for arthralgias.  Skin: Positive for rash.  All other systems reviewed and are negative.      Objective:    BP 122/86   Pulse 96   Temp 97.2 F (36.2 C)   Ht 5\' 9"  (1.753 m)   Wt 166 lb (75.3 kg)   SpO2 98%   BMI 24.51 kg/m    Physical Exam  Constitutional: He is oriented to person, place, and time. He appears well-developed and well-nourished. No distress.  HENT:  Head: Normocephalic and atraumatic.  Eyes: Conjunctivae are normal.  Cardiovascular: Normal rate.   Pulmonary/Chest: Effort normal.  Musculoskeletal: Normal range of motion.  Neurological: He is alert and oriented to person, place, and time. No cranial nerve deficit.  Skin: He is not diaphoretic.     Psychiatric: He has a normal mood and affect. His behavior is normal. Judgment and thought content normal.  Nursing note and vitals reviewed.         Assessment & Plan:   Osteoarthritis of multiple joints, unspecified osteoarthritis type  Rash No Follow-up on file.

## 2016-08-04 DIAGNOSIS — E663 Overweight: Secondary | ICD-10-CM | POA: Diagnosis not present

## 2016-08-04 DIAGNOSIS — R21 Rash and other nonspecific skin eruption: Secondary | ICD-10-CM | POA: Diagnosis not present

## 2016-08-04 DIAGNOSIS — M2550 Pain in unspecified joint: Secondary | ICD-10-CM | POA: Diagnosis not present

## 2016-08-04 DIAGNOSIS — Z6825 Body mass index (BMI) 25.0-25.9, adult: Secondary | ICD-10-CM | POA: Diagnosis not present

## 2016-08-04 DIAGNOSIS — M150 Primary generalized (osteo)arthritis: Secondary | ICD-10-CM | POA: Diagnosis not present

## 2016-08-04 DIAGNOSIS — M0579 Rheumatoid arthritis with rheumatoid factor of multiple sites without organ or systems involvement: Secondary | ICD-10-CM | POA: Diagnosis not present

## 2016-09-01 DIAGNOSIS — E663 Overweight: Secondary | ICD-10-CM | POA: Diagnosis not present

## 2016-09-01 DIAGNOSIS — M2550 Pain in unspecified joint: Secondary | ICD-10-CM | POA: Diagnosis not present

## 2016-09-01 DIAGNOSIS — M150 Primary generalized (osteo)arthritis: Secondary | ICD-10-CM | POA: Diagnosis not present

## 2016-09-01 DIAGNOSIS — Z79899 Other long term (current) drug therapy: Secondary | ICD-10-CM | POA: Diagnosis not present

## 2016-09-01 DIAGNOSIS — Z6825 Body mass index (BMI) 25.0-25.9, adult: Secondary | ICD-10-CM | POA: Diagnosis not present

## 2016-09-01 DIAGNOSIS — M0579 Rheumatoid arthritis with rheumatoid factor of multiple sites without organ or systems involvement: Secondary | ICD-10-CM | POA: Diagnosis not present

## 2016-12-02 DIAGNOSIS — Z79899 Other long term (current) drug therapy: Secondary | ICD-10-CM | POA: Diagnosis not present

## 2016-12-02 DIAGNOSIS — M150 Primary generalized (osteo)arthritis: Secondary | ICD-10-CM | POA: Diagnosis not present

## 2016-12-02 DIAGNOSIS — M2550 Pain in unspecified joint: Secondary | ICD-10-CM | POA: Diagnosis not present

## 2016-12-02 DIAGNOSIS — M0579 Rheumatoid arthritis with rheumatoid factor of multiple sites without organ or systems involvement: Secondary | ICD-10-CM | POA: Diagnosis not present

## 2016-12-02 DIAGNOSIS — Z6824 Body mass index (BMI) 24.0-24.9, adult: Secondary | ICD-10-CM | POA: Diagnosis not present

## 2016-12-15 ENCOUNTER — Ambulatory Visit (INDEPENDENT_AMBULATORY_CARE_PROVIDER_SITE_OTHER): Payer: 59 | Admitting: Family Medicine

## 2016-12-15 ENCOUNTER — Encounter: Payer: Self-pay | Admitting: Family Medicine

## 2016-12-15 VITALS — BP 126/82 | HR 80 | Temp 97.5°F | Wt 163.0 lb

## 2016-12-15 DIAGNOSIS — M159 Polyosteoarthritis, unspecified: Secondary | ICD-10-CM | POA: Diagnosis not present

## 2016-12-15 DIAGNOSIS — F1010 Alcohol abuse, uncomplicated: Secondary | ICD-10-CM

## 2016-12-15 DIAGNOSIS — R630 Anorexia: Secondary | ICD-10-CM | POA: Diagnosis not present

## 2016-12-15 DIAGNOSIS — E7800 Pure hypercholesterolemia, unspecified: Secondary | ICD-10-CM | POA: Diagnosis not present

## 2016-12-15 DIAGNOSIS — F1020 Alcohol dependence, uncomplicated: Secondary | ICD-10-CM | POA: Insufficient documentation

## 2016-12-15 DIAGNOSIS — F1021 Alcohol dependence, in remission: Secondary | ICD-10-CM | POA: Insufficient documentation

## 2016-12-15 LAB — LIPID PANEL
CHOL/HDL RATIO: 4
Cholesterol: 214 mg/dL — ABNORMAL HIGH (ref 0–200)
HDL: 54.8 mg/dL (ref >39.00)
LDL CALC: 136 mg/dL — AB (ref 0–99)
NonHDL: 159.03
TRIGLYCERIDES: 114 mg/dL (ref 0.0–149.0)
VLDL: 22.8 mg/dL (ref 0.0–40.0)

## 2016-12-15 LAB — CBC WITH DIFFERENTIAL/PLATELET
Basophils Absolute: 0.1 10*3/uL (ref 0.0–0.1)
Basophils Relative: 1.1 % (ref 0.0–3.0)
EOS PCT: 2.9 % (ref 0.0–5.0)
Eosinophils Absolute: 0.2 10*3/uL (ref 0.0–0.7)
HCT: 43.8 % (ref 39.0–52.0)
HEMOGLOBIN: 14.7 g/dL (ref 13.0–17.0)
LYMPHS ABS: 1.7 10*3/uL (ref 0.7–4.0)
Lymphocytes Relative: 24.8 % (ref 12.0–46.0)
MCHC: 33.5 g/dL (ref 30.0–36.0)
MCV: 104.9 fl — ABNORMAL HIGH (ref 78.0–100.0)
Monocytes Absolute: 0.8 10*3/uL (ref 0.1–1.0)
Monocytes Relative: 12.1 % — ABNORMAL HIGH (ref 3.0–12.0)
Neutro Abs: 4.1 10*3/uL (ref 1.4–7.7)
Neutrophils Relative %: 59.1 % (ref 43.0–77.0)
Platelets: 206 10*3/uL (ref 150.0–400.0)
RBC: 4.18 Mil/uL — ABNORMAL LOW (ref 4.22–5.81)
RDW: 13.7 % (ref 11.5–15.5)
WBC: 7 10*3/uL (ref 4.0–10.5)

## 2016-12-15 LAB — COMPREHENSIVE METABOLIC PANEL
ALT: 27 U/L (ref 0–53)
AST: 28 U/L (ref 0–37)
Albumin: 3.4 g/dL — ABNORMAL LOW (ref 3.5–5.2)
Alkaline Phosphatase: 81 U/L (ref 39–117)
BUN: 11 mg/dL (ref 6–23)
CO2: 31 meq/L (ref 19–32)
Calcium: 9.2 mg/dL (ref 8.4–10.5)
Chloride: 103 mEq/L (ref 96–112)
Creatinine, Ser: 0.73 mg/dL (ref 0.40–1.50)
GFR: 112.38 mL/min (ref >60.00)
Glucose, Bld: 104 mg/dL — ABNORMAL HIGH (ref 70–99)
POTASSIUM: 4.4 meq/L (ref 3.5–5.1)
SODIUM: 137 meq/L (ref 135–145)
Total Bilirubin: 0.7 mg/dL (ref 0.2–1.2)
Total Protein: 7.5 g/dL (ref 6.0–8.3)

## 2016-12-15 LAB — PSA: PSA: 2.14 ng/mL (ref 0.10–4.00)

## 2016-12-15 NOTE — Progress Notes (Signed)
Subjective:   Patient ID: William Gibson, male    DOB: April 19, 1946, 71 y.o.   MRN: [id number]  William Gibson is a pleasant 71 y.o. year old male who presents to clinic today with his wife for left knee pain and Anorexia  on 12/15/2016  HPI:  Although CC states left knee pain and anorexia, Ms. Olazabal states that is not truly the purpose of this appointment.  He has been seeing Dr. Lenna Gilford, rheumatology for his knee pain/OA. No longer on humira. Now only taking mobic.  She wanted him to come see me today because he is drinking too much alcohol.  Since he retired, he has been drinking almost a fifth of liquor daily.  He states he is just "bored."  Worked 80 hours a week and never had any hobbies.  So now while his wife is at work, he has been drinking more. This has caused a decrease appetite.   Current Outpatient Prescriptions on File Prior to Visit  Medication Sig Dispense Refill  . meloxicam (MOBIC) 15 MG tablet Take 15 mg by mouth daily.  2  . omeprazole (PRILOSEC) 20 MG capsule Take 1 capsule (20 mg total) by mouth daily. 90 capsule 3   No current facility-administered medications on file prior to visit.     No Known Allergies  No past medical history on file.  Past Surgical History:  Procedure Laterality Date  . VASECTOMY    . VASECTOMY REVERSAL      Family History  Problem Relation Age of Onset  . Colon cancer Neg Hx     Social History   Social History  . Marital status: Married    Spouse name: N/A  . Number of children: N/A  . Years of education: N/A   Occupational History  . Not on file.   Social History Main Topics  . Smoking status: Current Every Day Smoker    Packs/day: 2.00    Types: Cigarettes  . Smokeless tobacco: Never Used  . Alcohol use 16.8 oz/week    28 Shots of liquor per week     Comment: moderate  . Drug use: No  . Sexual activity: Not on file   Other Topics Concern  . Not on file   Social History Narrative   Does not have a  living will.   Desires CPR, does not want prolonged life support if futile.            The PMH, PSH, Social History, Family History, Medications, and allergies have been reviewed in [name], and have been updated if relevant.  Review of Systems  Constitutional: Positive for appetite change. Negative for unexpected weight change.  Respiratory: Negative.   Cardiovascular: Negative.   Gastrointestinal: Negative.   Musculoskeletal: Positive for arthralgias and joint swelling.  Neurological: Negative.   Psychiatric/Behavioral: Negative.   All other systems reviewed and are negative.      Objective:    BP 126/82 (BP Location: Left Arm, Patient Position: Sitting, Cuff Size: Normal)   Pulse 80   Temp (!) 97.5 F (36.4 C) (Oral)   Wt 163 lb (73.9 kg)   SpO2 98%   BMI 24.07 kg/m   Wt Readings from Last 3 Encounters:  12/15/16 163 lb (73.9 kg)  07/16/16 166 lb (75.3 kg)  01/30/16 167 lb 4 oz (75.9 kg)     Physical Exam  Constitutional: He is oriented to person, place, and time. He appears well-developed and well-nourished. No distress.  HENT:  Head:  Normocephalic and atraumatic.  Eyes: Conjunctivae are normal.  Cardiovascular: Normal rate.   Pulmonary/Chest: Effort normal.  Abdominal: Soft. Bowel sounds are normal. He exhibits no distension. There is no tenderness. There is no rebound and no guarding.  Musculoskeletal: Normal range of motion.  Neurological: He is alert and oriented to person, place, and time. No cranial nerve deficit.  Skin: Skin is warm and dry. He is not diaphoretic.  Psychiatric: He has a normal mood and affect. His behavior is normal. Judgment and thought content normal.  Nursing note and vitals reviewed.         Assessment & Plan:   Osteoarthritis of multiple joints, unspecified osteoarthritis type  Elevated LDL cholesterol level - Plan: Lipid panel  Loss of appetite - Plan: CBC with Differential/Platelet, Comprehensive metabolic panel,  PSA  Alcohol abuse No Follow-up on file.

## 2016-12-15 NOTE — Assessment & Plan Note (Signed)
>  25 minutes spent in face to face time with patient, >50% spent in counselling or coordination of care CAGE positive. Discussed cutting back and not stopping abruptly and how to go about this.  He is refusing rehab or counseling and feels he can do this on his own.  Offered my support to pt and his wife.

## 2017-04-01 ENCOUNTER — Encounter: Payer: Self-pay | Admitting: Nurse Practitioner

## 2017-04-01 ENCOUNTER — Ambulatory Visit (INDEPENDENT_AMBULATORY_CARE_PROVIDER_SITE_OTHER): Payer: 59 | Admitting: Nurse Practitioner

## 2017-04-01 VITALS — BP 142/94 | HR 95 | Temp 97.7°F | Ht 69.0 in | Wt 164.0 lb

## 2017-04-01 DIAGNOSIS — J069 Acute upper respiratory infection, unspecified: Secondary | ICD-10-CM

## 2017-04-01 DIAGNOSIS — J209 Acute bronchitis, unspecified: Secondary | ICD-10-CM | POA: Diagnosis not present

## 2017-04-01 DIAGNOSIS — H6123 Impacted cerumen, bilateral: Secondary | ICD-10-CM | POA: Diagnosis not present

## 2017-04-01 MED ORDER — DOXYCYCLINE HYCLATE 100 MG PO TABS
100.0000 mg | ORAL_TABLET | Freq: Two times a day (BID) | ORAL | 0 refills | Status: AC
Start: 2017-04-04 — End: 2017-04-11

## 2017-04-01 MED ORDER — GUAIFENESIN ER 600 MG PO TB12
600.0000 mg | ORAL_TABLET | Freq: Two times a day (BID) | ORAL | 0 refills | Status: DC | PRN
Start: 2017-04-01 — End: 2017-06-01

## 2017-04-01 MED ORDER — OXYMETAZOLINE HCL 0.05 % NA SOLN: 1.0000 | Freq: Two times a day (BID) | NASAL | 0 refills | Status: DC

## 2017-04-01 MED ORDER — FLUTICASONE PROPIONATE 50 MCG/ACT NA SUSP: 2.0000 | Freq: Every day | NASAL | 0 refills | Status: DC

## 2017-04-01 MED ORDER — METHYLPREDNISOLONE ACETATE 40 MG/ML IJ SUSP
40.0000 mg | Freq: Once | INTRAMUSCULAR | Status: AC
  Administered 2017-04-01: 40 mg via INTRAMUSCULAR

## 2017-04-01 MED ORDER — PROMETHAZINE-DM 6.25-15 MG/5ML PO SYRP: 5.0000 mL | ORAL_SOLUTION | Freq: Three times a day (TID) | ORAL | 0 refills | Status: DC | PRN

## 2017-04-01 NOTE — Progress Notes (Signed)
Subjective:  Patient ID: William Gibson, male    DOB: Dec 09, 1945  Age: 71 y.o. MRN: [id number]  CC: Cough (cough,congestion,drainage,yellow mucus,sore throat---4 days. )   URI   This is a new problem. The current episode started in the past 7 days. The problem has been gradually worsening. There has been no fever. Associated symptoms include congestion, coughing, ear pain, headaches, a plugged ear sensation, rhinorrhea, sinus pain, sneezing and a sore throat. Pertinent negatives include no chest pain, nausea, swollen glands or wheezing. He has tried decongestant, acetaminophen and NSAIDs for the symptoms. The treatment provided no relief.   Outpatient Medications Prior to Visit  Medication Sig Dispense Refill  . meloxicam (MOBIC) 15 MG tablet Take 15 mg by mouth daily.  2  . omeprazole (PRILOSEC) 20 MG capsule Take 1 capsule (20 mg total) by mouth daily. 90 capsule 3   No facility-administered medications prior to visit.     ROS See HPI  Procedure Note :     Procedure :  Cerumen removal   Indication:  Cerumen impaction (bilateral)   Risks, including pain, dizziness, eardrum perforation, bleeding, infection and others as well as benefits were explained to the patient in detail. Verbal consent was obtained and the patient agreed to proceed.    A large amount wax was recovered with use of ear loop.   Tolerated well. Complications: None.   Postprocedure instructions :  Call if problems.  Objective:  BP (!) 142/94   Pulse 95   Temp 97.7 F (36.5 C)   Ht 5\' 9"  (1.753 m)   Wt 164 lb (74.4 kg)   SpO2 96%   BMI 24.22 kg/m   BP Readings from Last 3 Encounters:  04/01/17 (!) 142/94  12/15/16 126/82  07/16/16 122/86    Wt Readings from Last 3 Encounters:  04/01/17 164 lb (74.4 kg)  12/15/16 163 lb (73.9 kg)  07/16/16 166 lb (75.3 kg)    Physical Exam  Constitutional: He is oriented to person, place, and time. No distress.  HENT:  Right Ear: External ear and ear canal  normal. No mastoid tenderness. Tympanic membrane is not erythematous and not bulging. A middle ear effusion is present.  Left Ear: Ear canal normal. No mastoid tenderness. Tympanic membrane is not erythematous and not bulging. A middle ear effusion is present.  Nose: Mucosal edema and rhinorrhea present. Right sinus exhibits maxillary sinus tenderness and frontal sinus tenderness. Left sinus exhibits maxillary sinus tenderness and frontal sinus tenderness.  Mouth/Throat: Uvula is midline. Posterior oropharyngeal erythema present. No oropharyngeal exudate.  Eyes: Conjunctivae and EOM are normal. Pupils are equal, round, and reactive to light. No scleral icterus.  Neck: Normal range of motion. Neck supple.  Cardiovascular: Normal rate and regular rhythm.  Pulmonary/Chest: Effort normal and breath sounds normal. He has no wheezes. He has no rales.  Lymphadenopathy:    He has no cervical adenopathy.  Neurological: He is alert and oriented to person, place, and time.  Skin: Skin is warm.  Vitals reviewed.   Lab Results  Component Value Date   WBC 7.0 12/15/2016   HGB 14.7 12/15/2016   HCT 43.8 12/15/2016   PLT 206.0 12/15/2016   GLUCOSE 104 (H) 12/15/2016   CHOL 214 (H) 12/15/2016   TRIG 114.0 12/15/2016   HDL 54.80 12/15/2016   LDLDIRECT 143.8 05/15/2013   LDLCALC 136 (H) 12/15/2016   ALT 27 12/15/2016   AST 28 12/15/2016   NA 137 12/15/2016   K 4.4 12/15/2016  CL 103 12/15/2016   CREATININE 0.73 12/15/2016   BUN 11 12/15/2016   CO2 31 12/15/2016   TSH 2.15 09/26/2014   PSA 2.14 12/15/2016    Dg Chest 2 View  Result Date: 09/26/2014 CLINICAL DATA:  Left lower extremity edema, shortness of breath for 1 month EXAM: CHEST  2 VIEW COMPARISON:  04/26/2012 FINDINGS: Cardiomediastinal silhouette is stable. No acute infiltrate or pleural effusion. No pulmonary edema. Hyperinflation again noted. Bony thorax is unremarkable. IMPRESSION: Stable COPD.  No active disease. Electronically  Signed   By: Lahoma Crocker M.D.   On: 09/26/2014 14:42    Assessment & Plan:   William Gibson was seen today for cough.  Diagnoses and all orders for this visit:  Acute bronchitis, unspecified organism -     methylPREDNISolone acetate (DEPO-MEDROL) injection 40 mg -     promethazine-dextromethorphan (PROMETHAZINE-DM) 6.25-15 MG/5ML syrup; Take 5 mLs by mouth 3 (three) times daily as needed for cough. -     guaiFENesin (MUCINEX) 600 MG 12 hr tablet; Take 1 tablet (600 mg total) by mouth 2 (two) times daily as needed for cough or to loosen phlegm. -     fluticasone (FLONASE) 50 MCG/ACT nasal spray; Place 2 sprays into both nostrils daily. -     oxymetazoline (AFRIN NASAL SPRAY) 0.05 % nasal spray; Place 1 spray into both nostrils 2 (two) times daily. Use only for 3days, then stop -     doxycycline (VIBRA-TABS) 100 MG tablet; Take 1 tablet (100 mg total) by mouth 2 (two) times daily for 7 days.  Acute URI -     methylPREDNISolone acetate (DEPO-MEDROL) injection 40 mg -     promethazine-dextromethorphan (PROMETHAZINE-DM) 6.25-15 MG/5ML syrup; Take 5 mLs by mouth 3 (three) times daily as needed for cough. -     guaiFENesin (MUCINEX) 600 MG 12 hr tablet; Take 1 tablet (600 mg total) by mouth 2 (two) times daily as needed for cough or to loosen phlegm. -     fluticasone (FLONASE) 50 MCG/ACT nasal spray; Place 2 sprays into both nostrils daily. -     oxymetazoline (AFRIN NASAL SPRAY) 0.05 % nasal spray; Place 1 spray into both nostrils 2 (two) times daily. Use only for 3days, then stop -     doxycycline (VIBRA-TABS) 100 MG tablet; Take 1 tablet (100 mg total) by mouth 2 (two) times daily for 7 days.  Bilateral impacted cerumen   I am having William Gibson start on promethazine-dextromethorphan, guaiFENesin, fluticasone, oxymetazoline, and doxycycline. I am also having him maintain his omeprazole and meloxicam. We will continue to administer methylPREDNISolone acetate.  Meds ordered this encounter    Medications  . methylPREDNISolone acetate (DEPO-MEDROL) injection 40 mg  . promethazine-dextromethorphan (PROMETHAZINE-DM) 6.25-15 MG/5ML syrup    Sig: Take 5 mLs by mouth 3 (three) times daily as needed for cough.    Dispense:  240 mL    Refill:  0    Order Specific Question:   Supervising Provider    Answer:   Lucille Passy [[id number]]  . guaiFENesin (MUCINEX) 600 MG 12 hr tablet    Sig: Take 1 tablet (600 mg total) by mouth 2 (two) times daily as needed for cough or to loosen phlegm.    Dispense:  14 tablet    Refill:  0    Order Specific Question:   Supervising Provider    Answer:   Lucille Passy [[id number]]  . fluticasone (FLONASE) 50 MCG/ACT nasal spray    Sig: Place 2  sprays into both nostrils daily.    Dispense:  16 g    Refill:  0    Order Specific Question:   Supervising Provider    Answer:   Lucille Passy [[id number]]  . oxymetazoline (AFRIN NASAL SPRAY) 0.05 % nasal spray    Sig: Place 1 spray into both nostrils 2 (two) times daily. Use only for 3days, then stop    Dispense:  30 mL    Refill:  0    Order Specific Question:   Supervising Provider    Answer:   Lucille Passy [[id number]]  . doxycycline (VIBRA-TABS) 100 MG tablet    Sig: Take 1 tablet (100 mg total) by mouth 2 (two) times daily for 7 days.    Dispense:  14 tablet    Refill:  0    Do not fill before 04/04/2017    Order Specific Question:   Supervising Provider    Answer:   Lucille Passy [[id number]]    Follow-up: Return if symptoms worsen or fail to improve.  Wilfred Lacy, NP

## 2017-04-01 NOTE — Patient Instructions (Addendum)
URI Instructions: Flonase and Afrin use: apply 1spray of afrin in each nare, wait 11mins, then apply 2sprays of flonase in each nare. Use both nasal spray consecutively x 3days, then flonase only for at least 14days.  Encourage adequate oral hydration.  Use over-the-counter  "cold" medicines  such as "Tylenol cold" , "Advil cold",  "Mucinex" or" Mucinex D"  for cough and congestion.  Avoid decongestants if you have high blood pressure. Use" Delsym" or" Robitussin" cough syrup varietis for cough.  You can use plain "Tylenol" or "Advi"l for fever, chills and achyness.   "Common cold" symptoms are usually triggered by a virus.  The antibiotics are usually not necessary. On average, a" viral cold" illness would take 4-7 days to resolve. Please, make an appointment if you are not better or if you're worse.  Start doxycycline if no improvement by Sunday.

## 2017-05-04 DIAGNOSIS — M150 Primary generalized (osteo)arthritis: Secondary | ICD-10-CM | POA: Diagnosis not present

## 2017-05-04 DIAGNOSIS — Z79899 Other long term (current) drug therapy: Secondary | ICD-10-CM | POA: Diagnosis not present

## 2017-05-04 DIAGNOSIS — M0579 Rheumatoid arthritis with rheumatoid factor of multiple sites without organ or systems involvement: Secondary | ICD-10-CM | POA: Diagnosis not present

## 2017-05-04 DIAGNOSIS — M2550 Pain in unspecified joint: Secondary | ICD-10-CM | POA: Diagnosis not present

## 2017-05-04 DIAGNOSIS — Z6825 Body mass index (BMI) 25.0-25.9, adult: Secondary | ICD-10-CM | POA: Diagnosis not present

## 2017-05-04 DIAGNOSIS — E663 Overweight: Secondary | ICD-10-CM | POA: Diagnosis not present

## 2017-06-01 ENCOUNTER — Ambulatory Visit: Payer: 59 | Admitting: Family Medicine

## 2017-06-01 ENCOUNTER — Encounter: Payer: Self-pay | Admitting: Family Medicine

## 2017-08-17 ENCOUNTER — Encounter: Payer: Self-pay | Admitting: Family Medicine

## 2017-08-17 ENCOUNTER — Ambulatory Visit (INDEPENDENT_AMBULATORY_CARE_PROVIDER_SITE_OTHER): Payer: 59 | Admitting: Family Medicine

## 2017-08-17 VITALS — BP 154/96 | HR 90 | Temp 98.3°F | Ht 69.0 in | Wt 171.0 lb

## 2017-08-17 DIAGNOSIS — H9190 Unspecified hearing loss, unspecified ear: Secondary | ICD-10-CM | POA: Diagnosis not present

## 2017-08-17 DIAGNOSIS — R413 Other amnesia: Secondary | ICD-10-CM

## 2017-08-17 DIAGNOSIS — M069 Rheumatoid arthritis, unspecified: Secondary | ICD-10-CM | POA: Diagnosis not present

## 2017-08-17 DIAGNOSIS — R2990 Unspecified symptoms and signs involving the nervous system: Secondary | ICD-10-CM | POA: Insufficient documentation

## 2017-08-17 HISTORY — DX: Other amnesia: R41.3

## 2017-08-17 HISTORY — DX: Rheumatoid arthritis, unspecified: M06.9

## 2017-08-17 NOTE — Patient Instructions (Signed)
Great to see you. Please schedule an appointment with Dr.Hawks.

## 2017-08-17 NOTE — Assessment & Plan Note (Signed)
MMSE is reassuring- 29/30. >25 minutes spent in face to face time with patient, his wife and daughter, >50% spent in counselling or coordination of care. I suspect there is a component of pseudodementia due to increased stressors- pain, side effects from prednisone, difficulty hearing. Advised to follow up with Dr. Lenna Gilford to consider other biological agents and weaning off prednisone. Refer to audiology for hearing tests. The patient indicates understanding of these issues and agrees with the plan.

## 2017-08-17 NOTE — Progress Notes (Signed)
Subjective:   Patient ID: PAT SIRES, male    DOB: 1945-09-15, 72 y.o.   MRN: [id number]  MIKAH POSS is a pleasant 72 y.o. year old male who presents to clinic today with Memory Loss (Patient is here today C/O memory issues about a year.  Also having trouble walking worsened over the last month.  Daughter would like Dr. Deborra Medina to know her made a comment that would not go to see another MD although she feels that a referral to Neuro may be appropriate.  )  on 08/17/2017  HPI:  Here with daughter and wife.  Past year, family feels he is in a bad mood, short tempered and repeating things a lot.  Hearing isn't good and so he keeps isolating himself from others.  He denies feeling depressed.  Since starting Prednisone, he feels "shaky on the inside," anxious.  Has RA- currently on prednisone daily.  Sees Dr. Lenna Gilford, rheumatology. Tried Humira- caused a rash. Still in significant pain- knees and hands can often be a 7/10 on pain scale.  MMSE - Mini Mental State Exam 08/17/2017  Orientation to time 5  Orientation to Place 4  Registration 3  Attention/ Calculation 5  Recall 3  Language- name 2 objects 2  Language- repeat 1  Language- follow 3 step command 3  Language- read & follow direction 1  Write a sentence 1  Copy design 1  Total score 29     Current Outpatient Medications on File Prior to Visit  Medication Sig Dispense Refill  . meloxicam (MOBIC) 15 MG tablet Take 15 mg by mouth daily.  2  . omeprazole (PRILOSEC) 20 MG capsule Take 1 capsule (20 mg total) by mouth daily. 90 capsule 3  . predniSONE (DELTASONE) 5 MG tablet Take 5 mg by mouth daily with breakfast.     No current facility-administered medications on file prior to visit.     No Known Allergies  No past medical history on file.  Past Surgical History:  Procedure Laterality Date  . VASECTOMY    . VASECTOMY REVERSAL      Family History  Problem Relation Age of Onset  . Colon cancer Neg Hx      Social History   Socioeconomic History  . Marital status: Married    Spouse name: Not on file  . Number of children: Not on file  . Years of education: Not on file  . Highest education level: Not on file  Occupational History  . Not on file  Social Needs  . Financial resource strain: Not on file  . Food insecurity:    Worry: Not on file    Inability: Not on file  . Transportation needs:    Medical: Not on file    Non-medical: Not on file  Tobacco Use  . Smoking status: Current Every Day Smoker    Packs/day: 2.00    Types: Cigarettes  . Smokeless tobacco: Never Used  Substance and Sexual Activity  . Alcohol use: Yes    Alcohol/week: 16.8 oz    Types: 28 Shots of liquor per week    Comment: moderate  . Drug use: No  . Sexual activity: Not on file  Lifestyle  . Physical activity:    Days per week: Not on file    Minutes per session: Not on file  . Stress: Not on file  Relationships  . Social connections:    Talks on phone: Not on file    Gets together:  Not on file    Attends religious service: Not on file    Active member of club or organization: Not on file    Attends meetings of clubs or organizations: Not on file    Relationship status: Not on file  . Intimate partner violence:    Fear of current or ex partner: Not on file    Emotionally abused: Not on file    Physically abused: Not on file    Forced sexual activity: Not on file  Other Topics Concern  . Not on file  Social History Narrative   Does not have a living will.   Desires CPR, does not want prolonged life support if futile.            The PMH, PSH, Social History, Family History, Medications, and allergies have been reviewed in [facility], and have been updated if relevant.   Review of Systems  Constitutional: Negative.   Musculoskeletal: Positive for arthralgias and joint swelling.  Psychiatric/Behavioral: Negative for agitation, behavioral problems, confusion, decreased concentration, dysphoric  mood, hallucinations, self-injury, sleep disturbance and suicidal ideas. The patient is nervous/anxious. The patient is not hyperactive.   All other systems reviewed and are negative.      Objective:    BP (!) 154/96 (BP Location: Left Arm, Cuff Size: Normal)   Pulse 90   Temp 98.3 F (36.8 C) (Oral)   Ht 5\' 9"  (1.753 m)   Wt 171 lb (77.6 kg)   SpO2 97%   BMI 25.25 kg/m   BP Readings from Last 3 Encounters:  08/17/17 (!) 154/96  06/01/17 140/84  04/01/17 (!) 142/94    Physical Exam  Constitutional: He is oriented to person, place, and time. He appears well-developed and well-nourished. No distress.  HENT:  Head: Normocephalic and atraumatic.  Eyes: EOM are normal.  Neck: Normal range of motion.  Cardiovascular: Normal rate.  Pulmonary/Chest: Effort normal.  Musculoskeletal:  Bilateral swollen dips, pips in hands  + crepitis of knees  Neurological: He is alert and oriented to person, place, and time. He displays normal reflexes. No cranial nerve deficit. Coordination normal.  Skin: Skin is warm. He is not diaphoretic.  Nursing note and vitals reviewed.         Assessment & Plan:   Hearing loss, unspecified hearing loss type, unspecified laterality - Plan: Ambulatory referral to Audiology  Rheumatoid arthritis, involving unspecified site, unspecified rheumatoid factor presence ([place])  Memory loss or impairment No follow-ups on file.

## 2017-08-18 DIAGNOSIS — M150 Primary generalized (osteo)arthritis: Secondary | ICD-10-CM | POA: Diagnosis not present

## 2017-08-18 DIAGNOSIS — Z6825 Body mass index (BMI) 25.0-25.9, adult: Secondary | ICD-10-CM | POA: Diagnosis not present

## 2017-08-18 DIAGNOSIS — M2550 Pain in unspecified joint: Secondary | ICD-10-CM | POA: Diagnosis not present

## 2017-08-18 DIAGNOSIS — E663 Overweight: Secondary | ICD-10-CM | POA: Diagnosis not present

## 2017-08-18 DIAGNOSIS — M0579 Rheumatoid arthritis with rheumatoid factor of multiple sites without organ or systems involvement: Secondary | ICD-10-CM | POA: Diagnosis not present

## 2017-08-18 DIAGNOSIS — Z79899 Other long term (current) drug therapy: Secondary | ICD-10-CM | POA: Diagnosis not present

## 2017-10-01 ENCOUNTER — Ambulatory Visit: Payer: 59 | Attending: Family Medicine | Admitting: Audiology

## 2017-10-01 DIAGNOSIS — IMO0001 Reserved for inherently not codable concepts without codable children: Secondary | ICD-10-CM

## 2017-10-01 DIAGNOSIS — Z01118 Encounter for examination of ears and hearing with other abnormal findings: Secondary | ICD-10-CM | POA: Insufficient documentation

## 2017-10-01 DIAGNOSIS — H918X1 Other specified hearing loss, right ear: Secondary | ICD-10-CM | POA: Diagnosis not present

## 2017-10-01 DIAGNOSIS — R94128 Abnormal results of other function studies of ear and other special senses: Secondary | ICD-10-CM | POA: Diagnosis not present

## 2017-10-01 DIAGNOSIS — H93299 Other abnormal auditory perceptions, unspecified ear: Secondary | ICD-10-CM | POA: Diagnosis not present

## 2017-10-01 NOTE — Procedures (Signed)
Outpatient Audiology and [place]  [place], [place] [address]  [phone]   Audiological Evaluation  Patient Name: William Gibson   Status: Outpatient   DOB: August 06, 1945    Diagnosis: Hearing Loss, unspecified               MRN: [id number] Date:  10/01/2017     Referent: Lucille Passy, MD  History: William Gibson was seen for an audiological evaluation. Primary Concern: Family and physician said "I need a hearing test" - "did not pass hearing test at physician office" Pain: None History of hearing problems: Y / N History of ear infections:   N History of ear surgery or "tubes" : N History of dizziness/vertigo:   N History of balance issues: N - has "arthritis so doesn't loose balance when walking in garden". 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  History of hypertension: N History of diabetes: N Family history of hearing loss:  Aunt from "rheumatic fever", father lost hearing after in [employer].    Evaluation: Conventional pure tone audiometry from 250Hz  - 8000Hz  with using insert earphones.  Hearing Thresholds are symmetrical in the low frequencies ranging from 40-50 dBHL from 250Hz  - 1000Hz ; 50-55 dBHL at 1500Hz ; 60-70 dBHL at 2000Hz  (poorer on the right) High frequency results from 4000Hz  - 8000Hz   Right ear: 110dBHL to no response. Possible mixed high frequency component.   Left ear: 85-90 dBHL. Sensorineural hearing loss. Reliability is good Speech reception levels (repeating words near threshold) using recorded spondee word lists:  Right ear: 50 dBHL.  Left ear:  45 dBHL Word recognition (at comfortably loud volumes) using recorded NU-6 word lists at [place], in quiet.  Right ear: 76%.  Left ear:   50% Word recognition in minimal background noise:  +5 dBHL  Right ear: 0%                              Left ear:  0%  Tympanometry (middle ear function) with ipsilateral  acoustic reflexes.  Right ear: Normal (Type A) with no response acoustic reflex at 1000Hz .  Left ear: Normal (Type A) with 90dB, present acoustic reflex at 1000Hz .   CONCLUSION:     William Gibson has a moderate low frequency sensorineural hearing loss that slopes in the high frequencies to a severe sensorineural hearing loss on the left side and a profound, possibly mixed hearing loss on the right side. Of concern is that even when speech is comfortably loud (equivalent to shouting at a distance of less than one foot from William Gibson) word recognition is only fair on the right and is poor on the left. In minimal background noise there appears to be no word recognition. William Gibson states that his father and aunt relied on "lip reading", and that he uses this to help him understand.   A hearing aid evaluation is recommended after a referral/evaluation by an Ear, Nose and Throat physician to further evaluate a) asymmetrical hearing loss poorer on the right side and b) reduced word recognition in quiet that drops to extremely poor in minimal background noise.   In addition please consider referral to physical therapy for treatment of his arthritis and for a balance assessment. The test results were discussed and William Gibson counseled.   RECOMMENDATIONS: 1.   Referral to an Ear, Nose and Throat physician a) for the asymmetrical  hearing loss poorer on the right side b) poor word recognition, extremely poor in minimal background noise. 2.   A hearing aid evaluation following ENT clearance. As discussed, check with insurance benefits to determine where the hearing aid may be obtained. Amplification helps make the signal louder and therefore often improves hearing and word recognition.  Amplification has many forms including hearing aids in one or both ears, an assistive listening device which have a microphone and speaker such as a small handheld device and/or even a surround sound system of speakers.   Amplification may be covered by some insurances, but not all.  It is important to note that hearing aids must be individually fit according to the hearing test results and the ear shape.  Audiologists and hearing aid dealers in [place] must be licensed in order to dispense hearing aids.  In addition, a trial period is mandated by law in our state because often amplification must be tried and then evaluated in order to determine benefit.There are many excellent choices when it comes to amplification in our area and providers are listed in the phone book under hearing aids, there are audiologists in private practice, those affiliated with Ear, Nose and Throat physicians, and there are audiologists located at [employer]. 3. Strategies that help improve hearing include: A) Face the speaker directly. Optimal is having the speakers face well - lit.  Unless amplified, being within 3-6 feet of the speaker will enhance word recognition. B) Avoid having the speaker back-lit as this will minimize the ability to use cues from lip-reading, facial expression and gestures. C)  Word recognition is poorer in background noise. For optimal word recognition, turn off the TV, radio or noisy fan when engaging in conversation. In a restaurant, try to sit away from noise sources and close to the primary speaker.  D)  Ask for topic clarification from time to time in order to remain in the conversation.  Most people don't mind repeating or clarifying a point when asked.  If needed, explain the difficulty hearing in background noise or hearing loss. 4.   Use hearing protection during noisy activities such as using a weed eater, moving the lawn, shooting, etc.    Musician's plugs, are available from [employer] for music related hearing protection because there is no distortion.  Other hearing protection, such as sponge plugs (available at pharmacies) or earmuffs (available at sporting goods stores or department stores such as  Paediatric nurse) are useful for noisy activities and venues.  5.   Consider referral to physical therapy for assessment of balance and "arthritis that is affecting my balance".    William Gibson, Au.D., CCC-A Doctor of Audiology 10/01/2017   cc: Lucille Passy, MD

## 2017-12-09 ENCOUNTER — Ambulatory Visit: Payer: 59 | Admitting: Audiology

## 2018-04-11 ENCOUNTER — Emergency Department (HOSPITAL_COMMUNITY)
Admission: EM | Admit: 2018-04-11 | Discharge: 2018-04-11 | Disposition: A | Discharge status: Home / Self Care | Payer: 59 | Source: Home / Self Care | Attending: Emergency Medicine | Admitting: Emergency Medicine

## 2018-04-11 ENCOUNTER — Emergency Department (HOSPITAL_COMMUNITY)
Admission: EM | Admit: 2018-04-11 | Discharge: 2018-04-11 | Disposition: A | Discharge status: Home / Self Care | Payer: 59 | Attending: Emergency Medicine | Admitting: Emergency Medicine

## 2018-04-11 ENCOUNTER — Other Ambulatory Visit: Payer: Self-pay

## 2018-04-11 ENCOUNTER — Encounter (HOSPITAL_COMMUNITY): Payer: Self-pay

## 2018-04-11 ENCOUNTER — Encounter: Payer: Self-pay | Admitting: Family Medicine

## 2018-04-11 ENCOUNTER — Ambulatory Visit (INDEPENDENT_AMBULATORY_CARE_PROVIDER_SITE_OTHER): Payer: 59 | Admitting: Family Medicine

## 2018-04-11 VITALS — BP 142/72 | HR 104 | Temp 98.4°F | Ht 69.0 in | Wt 158.0 lb

## 2018-04-11 DIAGNOSIS — R040 Epistaxis: Secondary | ICD-10-CM

## 2018-04-11 DIAGNOSIS — F17210 Nicotine dependence, cigarettes, uncomplicated: Secondary | ICD-10-CM | POA: Insufficient documentation

## 2018-04-11 DIAGNOSIS — M069 Rheumatoid arthritis, unspecified: Secondary | ICD-10-CM | POA: Insufficient documentation

## 2018-04-11 DIAGNOSIS — I10 Essential (primary) hypertension: Secondary | ICD-10-CM | POA: Diagnosis not present

## 2018-04-11 DIAGNOSIS — Z743 Need for continuous supervision: Secondary | ICD-10-CM | POA: Diagnosis not present

## 2018-04-11 DIAGNOSIS — R630 Anorexia: Secondary | ICD-10-CM

## 2018-04-11 HISTORY — DX: Gastro-esophageal reflux disease without esophagitis: K21.9

## 2018-04-11 LAB — CBC WITH DIFFERENTIAL/PLATELET
Basophils Absolute: 0.1 10*3/uL (ref 0.0–0.1)
Basophils Relative: 1.2 % (ref 0.0–3.0)
Eosinophils Absolute: 0.1 10*3/uL (ref 0.0–0.7)
Eosinophils Relative: 1.8 % (ref 0.0–5.0)
HCT: 45.4 % (ref 39.0–52.0)
HEMOGLOBIN: 15 g/dL (ref 13.0–17.0)
Lymphocytes Relative: 28.8 % (ref 12.0–46.0)
Lymphs Abs: 2.2 10*3/uL (ref 0.7–4.0)
MCHC: 33.1 g/dL (ref 30.0–36.0)
MCV: 105.9 fl — ABNORMAL HIGH (ref 78.0–100.0)
MONOS PCT: 11.2 % (ref 3.0–12.0)
Monocytes Absolute: 0.9 10*3/uL (ref 0.1–1.0)
Neutro Abs: 4.4 10*3/uL (ref 1.4–7.7)
Neutrophils Relative %: 57 % (ref 43.0–77.0)
Platelets: 228 10*3/uL (ref 150.0–400.0)
RBC: 4.29 Mil/uL (ref 4.22–5.81)
RDW: 13.1 % (ref 11.5–15.5)
WBC: 7.8 10*3/uL (ref 4.0–10.5)

## 2018-04-11 LAB — H. PYLORI ANTIBODY, IGG: H PYLORI IGG: NEGATIVE

## 2018-04-11 MED ORDER — OXYMETAZOLINE HCL 0.05 % NA SOLN: 1.0000 | Freq: Once | NASAL | Status: DC

## 2018-04-11 MED ORDER — LIDOCAINE HCL 4 % EX SOLN: Freq: Once | CUTANEOUS | Status: DC

## 2018-04-11 NOTE — Patient Instructions (Signed)
Nosebleed, Adult A nosebleed is when blood comes out of the nose. Nosebleeds are common. Usually, they are not a sign of a serious condition. Nosebleeds can happen if a small blood vessel in your nose starts to bleed or if the lining of your nose (mucous membrane) cracks. They are commonly caused by:  Allergies.  Colds.  Picking your nose.  Blowing your nose too hard.  An injury from sticking an object into your nose or getting hit in the nose.  Dry or cold air.  Less common causes of nosebleeds include:  Toxic fumes.  Something abnormal in the nose or in the air-filled spaces in the bones of the face (sinuses).  Growths in the nose, such as polyps.  Medicines or conditions that cause blood to clot slowly.  Certain illnesses or procedures that irritate or dry out the nasal passages.  Follow these instructions at home: When you have a nosebleed:  Sit down and tilt your head slightly forward.  Use a clean towel or tissue to pinch your nostrils under the bony part of your nose. After 10 minutes, let go of your nose and see if bleeding starts again. Do not release pressure before that time. If there is still bleeding, repeat the pinching and holding for 10 minutes until the bleeding stops.  Do not place tissues or gauze in the nose to stop bleeding.  Avoid lying down and avoid tilting your head backward. That may make blood collect in the throat and cause gagging or coughing.  Use a nasal spray decongestant to help with a nosebleed as told by your health care provider.  Do not use petroleum jelly or mineral oil in your nose. It can drip into your lungs. After a nosebleed:  Avoid blowing your nose or sniffing for a number of hours.  Avoid straining, lifting, or bending at the waist for several days. You may resume other normal activities as you are able.  Use saline spray or a humidifier as told by your health care provider.  Aspirinand blood thinners make bleeding more  likely. If you are prescribed these medicines and you suffer from nosebleeds: ? Ask your health care provider if you should stop taking the medicines or if you should adjust the dose. ? Do not stop taking medicines that your health care provider has recommended unless told by your health care provider.  If your nosebleed was caused by dry mucous membranes, use over-the-counter saline nasal spray or gel. This will keep the mucous membranes moist and allow them to heal. If you must use a lubricant: ? Choose one that is water-soluble. ? Use only as much as you need and use it only as often as needed. ? Do not lie down until several hours after you use it. Contact a health care provider if:  You have a fever.  You get nosebleeds often or more often than usual.  You bruise very easily.  You have a nosebleed from having something stuck in your nose.  You have bleeding in your mouth.  You vomit or cough up brown material.  You have a nosebleed after you start a new medicine. Get help right away if:  You have a nosebleed after a fall or a head injury.  Your nosebleed does not go away after 20 minutes.  You feel dizzy or weak.  You have unusual bleeding from other parts of your body.  You have unusual bruising on other parts of your body.  You become sweaty.    You vomit blood. This information is not intended to replace advice given to you by your health care provider. Make sure you discuss any questions you have with your health care provider. Document Released: 01/21/2005 Document Revised: 12/12/2015 Document Reviewed: 10/29/2015 Elsevier Interactive Patient Education  2018 Elsevier Inc.  

## 2018-04-11 NOTE — ED Triage Notes (Signed)
Pt was seen earlier to day for nose bleed by Dr Alvino Chapel,  Device in nose came out, asking for it to be fixed, dr. Council Mechanic and evaluated pt in triage.

## 2018-04-11 NOTE — Progress Notes (Signed)
Subjective:   Patient ID: William Gibson, male    DOB: 08/08/1945, 72 y.o.   MRN: [id number]  William Gibson is a pleasant 72 y.o. year old male who presents to clinic today with Epistaxis (Patient is here today C/O nose bleeds x4-5d mostly out of left nare.  He brushed his teeth this morning and his nose started bleeding.  When he got it stopped he went to shave and it started pouring again, and it happened aging while he was washing dishes.  He has been sleeping with a towel on his pillow at night.)  on 04/11/2018  HPI:   Left sided nose bleed-  Intermittent for past 4-5 days, only out of left nare.   Longest nose bleed lasted 20 minutes.  Does not feel dizzy.  Does have seasonal allergies.  No blood in his stool or bleeding from anywhere else noted.   Current Outpatient Medications on File Prior to Visit  Medication Sig Dispense Refill  . omeprazole (PRILOSEC) 20 MG capsule Take 1 capsule (20 mg total) by mouth daily. 90 capsule 3  . meloxicam (MOBIC) 15 MG tablet Take 15 mg by mouth daily.  2  . predniSONE (DELTASONE) 5 MG tablet Take 5 mg by mouth daily with breakfast.     No current facility-administered medications on file prior to visit.     No Known Allergies  No past medical history on file.  Past Surgical History:  Procedure Laterality Date  . VASECTOMY    . VASECTOMY REVERSAL      Family History  Problem Relation Age of Onset  . Colon cancer Neg Hx     Social History   Socioeconomic History  . Marital status: Married    Spouse name: Not on file  . Number of children: Not on file  . Years of education: Not on file  . Highest education level: Not on file  Occupational History  . Not on file  Social Needs  . Financial resource strain: Not on file  . Food insecurity:    Worry: Not on file    Inability: Not on file  . Transportation needs:    Medical: Not on file    Non-medical: Not on file  Tobacco Use  . Smoking status: Current Every Day Smoker     Packs/day: 2.00    Types: Cigarettes  . Smokeless tobacco: Never Used  Substance and Sexual Activity  . Alcohol use: Yes    Alcohol/week: 28.0 standard drinks    Types: 28 Shots of liquor per week    Comment: moderate  . Drug use: No  . Sexual activity: Not on file  Lifestyle  . Physical activity:    Days per week: Not on file    Minutes per session: Not on file  . Stress: Not on file  Relationships  . Social connections:    Talks on phone: Not on file    Gets together: Not on file    Attends religious service: Not on file    Active member of club or organization: Not on file    Attends meetings of clubs or organizations: Not on file    Relationship status: Not on file  . Intimate partner violence:    Fear of current or ex partner: Not on file    Emotionally abused: Not on file    Physically abused: Not on file    Forced sexual activity: Not on file  Other Topics Concern  . Not on file  Social History Narrative   Does not have a living will.   Desires CPR, does not want prolonged life support if futile.            The PMH, PSH, Social History, Family History, Medications, and allergies have been reviewed in [facility], and have been updated if relevant.  Review of Systems  Constitutional: Negative.   Neurological: Negative.   Hematological: Does not bruise/bleed easily.  All other systems reviewed and are negative.      Objective:    BP (!) 142/72 (BP Location: Left Arm, Cuff Size: Normal)   Pulse (!) 104   Temp 98.4 F (36.9 C) (Oral)   Ht 5\' 9"  (1.753 m)   Wt 158 lb (71.7 kg)   SpO2 95%   BMI 23.33 kg/m    Physical Exam Vitals signs and nursing note reviewed.  Constitutional:      Appearance: Normal appearance.  HENT:     Head: Normocephalic.     Nose:     Left Nostril: Epistaxis present.     Comments: Surface capillary left nare Eyes:     Extraocular Movements: Extraocular movements intact.  Cardiovascular:     Rate and Rhythm: Normal rate.    Pulmonary:     Effort: Pulmonary effort is normal.  Musculoskeletal: Normal range of motion.  Skin:    General: Skin is warm and dry.  Neurological:     General: No focal deficit present.     Mental Status: He is alert and oriented to person, place, and time.  Psychiatric:        Mood and Affect: Mood normal.        Behavior: Behavior normal.           Assessment & Plan:   Loss of appetite - Plan: H. pylori antibody, IgG  Left-sided epistaxis - Plan: CBC with Differential/Platelet Return in about 2 days (around 04/13/2018).

## 2018-04-11 NOTE — ED Notes (Signed)
Provided patient water with permission from Dr. Alvino Chapel.

## 2018-04-11 NOTE — ED Notes (Signed)
Provided abdominal pads applied pressure and ice to area. Pt family in room.

## 2018-04-11 NOTE — Assessment & Plan Note (Signed)
Left nare capillary cauterized. Discussed supportive care. Follow up in 2 days, go to ER if bleeding last longer than 30 minutes. The patient indicates understanding of these issues and agrees with the plan.

## 2018-04-11 NOTE — Discharge Instructions (Signed)
Follow-up with Dr. Deborra Medina for your high blood pressure.  Follow-up with Dr. Wellington Hampshire for your nosebleed.

## 2018-04-11 NOTE — ED Provider Notes (Signed)
[name] Note   CSN: [id number] Arrival date & time: 04/11/18  1612     History   Chief Complaint Chief Complaint  Patient presents with  . Epistaxis    HPI William Gibson is a 72 y.o. male.  HPI Patient presents with nosebleed.  Has had over the last 4 days.  Coming out of the left side but also location, right side and back of the throat.  No lightheadedness dizziness.  Seen at PCP today and sounds like they did some cautery.  Then told to come to ER if bleeding returns.  Does not feel lightheaded or dizzy although has had some fatigue but that predates the bleeding.  No trauma.  Not on anticoagulation. Past Medical History:  Diagnosis Date  . GERD (gastroesophageal reflux disease)     Patient Active Problem List   Diagnosis Date Noted  . Left-sided epistaxis 04/11/2018  . RA (rheumatoid arthritis) ([place]) 08/17/2017  . Memory loss or impairment 08/17/2017  . Loss of appetite 12/15/2016  . Alcohol abuse 12/15/2016  . Osteoarthritis 01/30/2016  . GERD (gastroesophageal reflux disease) 05/23/2014  . Elevated LDL cholesterol level 05/22/2013  . Erectile dysfunction 05/22/2013  . Tobacco abuse 04/26/2012    Past Surgical History:  Procedure Laterality Date  . VASECTOMY    . VASECTOMY REVERSAL          Home Medications    Prior to Admission medications   Medication Sig Start Date End Date Taking? Authorizing Provider  meloxicam (MOBIC) 15 MG tablet Take 15 mg by mouth daily. 07/18/15   [provider]  omeprazole (PRILOSEC) 20 MG capsule Take 1 capsule (20 mg total) by mouth daily. 05/23/14   Lucille Passy, MD  predniSONE (DELTASONE) 5 MG tablet Take 5 mg by mouth daily with breakfast.    [provider]    Family History Family History  Problem Relation Age of Onset  . Colon cancer Neg Hx     Social History Social History   Tobacco Use  . Smoking status: Current Every Day Smoker   Packs/day: 2.00    Types: Cigarettes  . Smokeless tobacco: Never Used  Substance Use Topics  . Alcohol use: Yes    Alcohol/week: 28.0 standard drinks    Types: 28 Shots of liquor per week    Comment: moderate  . Drug use: No     Allergies   Patient has no known allergies.   Review of Systems Review of Systems  Constitutional: Positive for fatigue.  HENT: Positive for nosebleeds.   Respiratory: Negative for shortness of breath.   Gastrointestinal: Negative for abdominal pain.  Genitourinary: Negative for flank pain.  Musculoskeletal: Negative for arthralgias.  Neurological: Negative for weakness.  Hematological: Does not bruise/bleed easily.     Physical Exam Updated Vital Signs BP (!) 168/110 (BP Location: Left Arm)   Pulse (!) 101   Temp 98.3 F (36.8 C) (Oral)   Resp 18   Ht 5\' 9"  (1.753 m)   Wt 72.1 kg   SpO2 97%   BMI 23.48 kg/m   Physical Exam HENT:     Head: Atraumatic.     Nose:     Comments: Ulcer to left anterior medial nasal septal area.  Approximately 1 cm across.  May have been cauterized. Neck:     Musculoskeletal: Normal range of motion.  Cardiovascular:     Rate and Rhythm: Regular rhythm.  Pulmonary:     Effort: Pulmonary effort  is normal.     Breath sounds: No wheezing.  Musculoskeletal:        General: No swelling.  Skin:    General: Skin is warm.     Capillary Refill: Capillary refill takes less than 2 seconds.  Neurological:     General: No focal deficit present.     Mental Status: He is alert.      ED Treatments / Results  Labs (all labs ordered are listed, but only abnormal results are displayed) Labs Reviewed - No data to display  EKG None  Radiology No results found.  Procedures .Epistaxis Management Date/Time: 04/11/2018 7:15 PM Performed by: Davonna Belling, MD Authorized by: Davonna Belling, MD   Consent:    Consent obtained:  Verbal   Consent given by:  Patient   Risks discussed:  Bleeding, nasal  injury, infection and pain   Alternatives discussed:  No treatment Anesthesia (see MAR for exact dosages):    Anesthesia method:  None Procedure details:    Treatment site:  L anterior   Treatment method:  Nasal balloon   Treatment episode: recurring   Post-procedure details:    Assessment:  Bleeding stopped Comments:     4.5 cm nasal balloon placed.   (including critical care time)  Medications Ordered in ED Medications - No data to display   Initial Impression / Assessment and Plan / ED Course  I have reviewed the triage vital signs and the nursing notes.  Pertinent labs & imaging results that were available during my care of the patient were reviewed by me and considered in my medical decision making (see chart for details).     Patient with nosebleed.  Nasal balloon placed.  No further bleeding clear to follow-up with ENT or PCP.  Final Clinical Impressions(s) / ED Diagnoses   Final diagnoses:  Acute anterior epistaxis    ED Discharge Orders    None       Davonna Belling, MD 04/11/18 [phone]

## 2018-04-11 NOTE — ED Triage Notes (Signed)
Per EMS- Patient reports that he has had intermittent nosebleeds x 5 days. Patient went to PCP today where they tried to  Stop the bleeding. Once home the patient stood up and nose bled. EMS states that the bleeding controlled at this time.

## 2018-04-12 ENCOUNTER — Emergency Department (HOSPITAL_COMMUNITY): Payer: 59 | Admitting: Anesthesiology

## 2018-04-12 ENCOUNTER — Emergency Department (HOSPITAL_COMMUNITY)
Admission: EM | Admit: 2018-04-12 | Discharge: 2018-04-12 | Disposition: A | Discharge status: Still In Hospital | Payer: 59 | Attending: Emergency Medicine | Admitting: Emergency Medicine

## 2018-04-12 ENCOUNTER — Encounter (HOSPITAL_COMMUNITY)
Admission: EM | Disposition: A | Discharge status: Still In Hospital | Payer: Self-pay | Source: Home / Self Care | Attending: Emergency Medicine

## 2018-04-12 ENCOUNTER — Encounter (HOSPITAL_COMMUNITY): Payer: Self-pay

## 2018-04-12 ENCOUNTER — Other Ambulatory Visit: Payer: Self-pay

## 2018-04-12 DIAGNOSIS — Z79899 Other long term (current) drug therapy: Secondary | ICD-10-CM | POA: Diagnosis not present

## 2018-04-12 DIAGNOSIS — M069 Rheumatoid arthritis, unspecified: Secondary | ICD-10-CM | POA: Insufficient documentation

## 2018-04-12 DIAGNOSIS — F17210 Nicotine dependence, cigarettes, uncomplicated: Secondary | ICD-10-CM | POA: Insufficient documentation

## 2018-04-12 DIAGNOSIS — Z7952 Long term (current) use of systemic steroids: Secondary | ICD-10-CM | POA: Diagnosis not present

## 2018-04-12 DIAGNOSIS — R040 Epistaxis: Secondary | ICD-10-CM | POA: Diagnosis not present

## 2018-04-12 DIAGNOSIS — Z7289 Other problems related to lifestyle: Secondary | ICD-10-CM | POA: Diagnosis not present

## 2018-04-12 DIAGNOSIS — I10 Essential (primary) hypertension: Secondary | ICD-10-CM | POA: Diagnosis not present

## 2018-04-12 DIAGNOSIS — K219 Gastro-esophageal reflux disease without esophagitis: Secondary | ICD-10-CM | POA: Diagnosis not present

## 2018-04-12 DIAGNOSIS — Z791 Long term (current) use of non-steroidal anti-inflammatories (NSAID): Secondary | ICD-10-CM | POA: Diagnosis not present

## 2018-04-12 HISTORY — PX: NASAL ENDOSCOPY WITH EPISTAXIS CONTROL: SHX5664

## 2018-04-12 HISTORY — DX: Epistaxis: R04.0

## 2018-04-12 LAB — CBC WITH DIFFERENTIAL/PLATELET
Abs Immature Granulocytes: 0.02 10*3/uL (ref 0.00–0.07)
Basophils Absolute: 0.1 10*3/uL (ref 0.0–0.1)
Basophils Relative: 1 %
Eosinophils Absolute: 0.2 10*3/uL (ref 0.0–0.5)
Eosinophils Relative: 3 %
HCT: 43.1 % (ref 39.0–52.0)
HEMOGLOBIN: 14.1 g/dL (ref 13.0–17.0)
Immature Granulocytes: 0 %
LYMPHS PCT: 24 %
Lymphs Abs: 1.8 10*3/uL (ref 0.7–4.0)
MCH: 35 pg — ABNORMAL HIGH (ref 26.0–34.0)
MCHC: 32.7 g/dL (ref 30.0–36.0)
MCV: 106.9 fL — ABNORMAL HIGH (ref 80.0–100.0)
MONOS PCT: 9 %
Monocytes Absolute: 0.7 10*3/uL (ref 0.1–1.0)
Neutro Abs: 4.9 10*3/uL (ref 1.7–7.7)
Neutrophils Relative %: 63 %
Platelets: 206 10*3/uL (ref 150–400)
RBC: 4.03 MIL/uL — ABNORMAL LOW (ref 4.22–5.81)
RDW: 12.3 % (ref 11.5–15.5)
WBC: 7.7 10*3/uL (ref 4.0–10.5)
nRBC: 0 % (ref 0.0–0.2)

## 2018-04-12 LAB — PROTIME-INR
INR: 0.9
Prothrombin Time: 12.1 seconds (ref 11.4–15.2)

## 2018-04-12 SURGERY — CONTROL OF EPISTAXIS, ENDOSCOPIC
Anesthesia: General

## 2018-04-12 MED ORDER — ONDANSETRON HCL 4 MG/2ML IJ SOLN
INTRAMUSCULAR | Status: DC | PRN
  Administered 2018-04-12: 4 mg via INTRAVENOUS

## 2018-04-12 MED ORDER — LACTATED RINGERS IV SOLN
INTRAVENOUS | Status: DC
  Administered 2018-04-12: 17:00:00 via INTRAVENOUS

## 2018-04-12 MED ORDER — PHENYLEPHRINE 40 MCG/ML (10ML) SYRINGE FOR IV PUSH (FOR BLOOD PRESSURE SUPPORT)
PREFILLED_SYRINGE | INTRAVENOUS | Status: DC | PRN
  Administered 2018-04-12: 80 ug via INTRAVENOUS

## 2018-04-12 MED ORDER — LIDOCAINE 2% (20 MG/ML) 5 ML SYRINGE
INTRAMUSCULAR | Status: AC
  Filled 2018-04-12: qty 5

## 2018-04-12 MED ORDER — FENTANYL CITRATE (PF) 250 MCG/5ML IJ SOLN
INTRAMUSCULAR | Status: DC | PRN
  Administered 2018-04-12 (×3): 50 ug via INTRAVENOUS

## 2018-04-12 MED ORDER — LIDOCAINE VISCOUS HCL 2 % MT SOLN
15.0000 mL | Freq: Once | OROMUCOSAL | Status: AC
  Administered 2018-04-12: 15 mL via OROMUCOSAL
  Filled 2018-04-12: qty 15

## 2018-04-12 MED ORDER — MEPERIDINE HCL 50 MG/ML IJ SOLN: 6.2500 mg | INTRAMUSCULAR | Status: DC | PRN

## 2018-04-12 MED ORDER — PROPOFOL 10 MG/ML IV BOLUS
INTRAVENOUS | Status: AC
  Filled 2018-04-12: qty 20

## 2018-04-12 MED ORDER — CEPHALEXIN 500 MG PO CAPS: 500.0000 mg | ORAL_CAPSULE | Freq: Three times a day (TID) | ORAL | 0 refills | Status: DC

## 2018-04-12 MED ORDER — FENTANYL CITRATE (PF) 100 MCG/2ML IJ SOLN: 25.0000 ug | INTRAMUSCULAR | Status: DC | PRN

## 2018-04-12 MED ORDER — LACTATED RINGERS IV SOLN: INTRAVENOUS | Status: DC

## 2018-04-12 MED ORDER — OXYMETAZOLINE HCL 0.05 % NA SOLN
NASAL | Status: AC
  Filled 2018-04-12: qty 15

## 2018-04-12 MED ORDER — LIDOCAINE 2% (20 MG/ML) 5 ML SYRINGE
INTRAMUSCULAR | Status: DC | PRN
  Administered 2018-04-12: 60 mg via INTRAVENOUS
  Administered 2018-04-12: 40 mg via INTRAVENOUS

## 2018-04-12 MED ORDER — SUCCINYLCHOLINE CHLORIDE 200 MG/10ML IV SOSY
PREFILLED_SYRINGE | INTRAVENOUS | Status: DC | PRN
  Administered 2018-04-12: 120 mg via INTRAVENOUS

## 2018-04-12 MED ORDER — ONDANSETRON HCL 4 MG/2ML IJ SOLN
INTRAMUSCULAR | Status: AC
  Filled 2018-04-12: qty 2

## 2018-04-12 MED ORDER — FENTANYL CITRATE (PF) 250 MCG/5ML IJ SOLN
INTRAMUSCULAR | Status: AC
  Filled 2018-04-12: qty 5

## 2018-04-12 MED ORDER — BACITRACIN-NEOMYCIN-POLYMYXIN 400-5-5000 EX OINT
TOPICAL_OINTMENT | CUTANEOUS | Status: DC | PRN
  Administered 2018-04-12: 1 via TOPICAL

## 2018-04-12 MED ORDER — MIDAZOLAM HCL 2 MG/2ML IJ SOLN
INTRAMUSCULAR | Status: DC | PRN
  Administered 2018-04-12 (×2): 2 mg via INTRAVENOUS

## 2018-04-12 MED ORDER — 0.9 % SODIUM CHLORIDE (POUR BTL) OPTIME
TOPICAL | Status: DC | PRN
  Administered 2018-04-12: 1000 mL

## 2018-04-12 MED ORDER — OXYMETAZOLINE HCL 0.05 % NA SOLN
NASAL | Status: DC | PRN
  Administered 2018-04-12: 1

## 2018-04-12 MED ORDER — LIDOCAINE-EPINEPHRINE (PF) 1 %-1:200000 IJ SOLN
INTRAMUSCULAR | Status: DC | PRN
  Administered 2018-04-12: 1 mL

## 2018-04-12 MED ORDER — SUCCINYLCHOLINE CHLORIDE 200 MG/10ML IV SOSY
PREFILLED_SYRINGE | INTRAVENOUS | Status: AC
  Filled 2018-04-12: qty 10

## 2018-04-12 MED ORDER — LIDOCAINE-EPINEPHRINE (PF) 1 %-1:200000 IJ SOLN
INTRAMUSCULAR | Status: AC
  Filled 2018-04-12: qty 30

## 2018-04-12 MED ORDER — METOCLOPRAMIDE HCL 5 MG/ML IJ SOLN: 10.0000 mg | Freq: Once | INTRAMUSCULAR | Status: DC | PRN

## 2018-04-12 MED ORDER — DEXAMETHASONE SODIUM PHOSPHATE 10 MG/ML IJ SOLN
INTRAMUSCULAR | Status: DC | PRN
  Administered 2018-04-12: 10 mg via INTRAVENOUS

## 2018-04-12 MED ORDER — DEXAMETHASONE SODIUM PHOSPHATE 10 MG/ML IJ SOLN
INTRAMUSCULAR | Status: AC
  Filled 2018-04-12: qty 1

## 2018-04-12 MED ORDER — BACITRACIN-NEOMYCIN-POLYMYXIN 400-5-5000 EX OINT
TOPICAL_OINTMENT | CUTANEOUS | Status: AC
  Filled 2018-04-12: qty 1

## 2018-04-12 MED ORDER — PROPOFOL 10 MG/ML IV BOLUS
INTRAVENOUS | Status: DC | PRN
  Administered 2018-04-12: 110 mg via INTRAVENOUS

## 2018-04-12 MED ORDER — MIDAZOLAM HCL 2 MG/2ML IJ SOLN
INTRAMUSCULAR | Status: AC
  Filled 2018-04-12: qty 4

## 2018-04-12 SURGICAL SUPPLY — 29 items
APL SKNCLS STERI-STRIP NONHPOA (GAUZE/BANDAGES/DRESSINGS) ×1
BENZOIN TINCTURE PRP APPL 2/3 (GAUZE/BANDAGES/DRESSINGS) ×2 IMPLANT
CANISTER SUCTION 1200CC (MISCELLANEOUS) ×3 IMPLANT
COAGULATOR SUCT 6 FR SWTCH (ELECTROSURGICAL) ×1
COAGULATOR SUCT SWTCH 10FR 6 (ELECTROSURGICAL) ×2 IMPLANT
COVER MAYO STAND STRL (DRAPES) ×3 IMPLANT
COVER SURGICAL LIGHT HANDLE (MISCELLANEOUS) ×3 IMPLANT
COVER WAND RF STERILE (DRAPES) IMPLANT
DECANTER SPIKE VIAL GLASS SM (MISCELLANEOUS) ×2 IMPLANT
DEPRESSOR TONGUE BLADE STERILE (MISCELLANEOUS) ×2 IMPLANT
DRAPE SHEET LG 3/4 BI-LAMINATE (DRAPES) ×2 IMPLANT
DRESSING NASAL POPE 10X1.5X2.5 (GAUZE/BANDAGES/DRESSINGS) IMPLANT
DRSG NASAL POPE 10X1.5X2.5 (GAUZE/BANDAGES/DRESSINGS) ×3
ELECT REM PT RETURN 15FT ADLT (MISCELLANEOUS) ×2 IMPLANT
ELECT REM PT RETURN 9FT PED (ELECTROSURGICAL)
ELECTRODE REM PT RETRN 9FT PED (ELECTROSURGICAL) IMPLANT
GAUZE SPONGE 2X2 8PLY STRL LF (GAUZE/BANDAGES/DRESSINGS) IMPLANT
GLOVE ECLIPSE 7.5 STRL STRAW (GLOVE) ×3 IMPLANT
GOWN STRL REUS W/ TWL XL LVL3 (GOWN DISPOSABLE) IMPLANT
GOWN STRL REUS W/TWL XL LVL3 (GOWN DISPOSABLE) ×3
HEMOSTAT SNOW SURGICEL 2X4 (HEMOSTASIS) ×2 IMPLANT
HEMOSTAT SURGICEL 2X3 (HEMOSTASIS) ×2 IMPLANT
KIT BASIN OR (CUSTOM PROCEDURE TRAY) ×2 IMPLANT
PATTIES SURGICAL 1X1 (DISPOSABLE) ×2 IMPLANT
SOLUTION ANTI FOG 6CC (MISCELLANEOUS) ×2 IMPLANT
SPONGE GAUZE 2X2 STER 10/PKG (GAUZE/BANDAGES/DRESSINGS)
TOWEL OR 17X26 10 PK STRL BLUE (TOWEL DISPOSABLE) ×3 IMPLANT
TUBING CONNECTING 10 (TUBING) ×2 IMPLANT
TUBING CONNECTING 10' (TUBING) ×1

## 2018-04-12 NOTE — ED Triage Notes (Signed)
EDP [place] at bedside at Triage. Per [facility]- Pt seen yesterday for presenting complaint. Pt presents with tube to left nare intact. Controlled prior to EMS arrival. Upon another EMS arrival bleeding. Pt presents to [facility] with bleed controlled. Pt states leaning over bathroom sink and bleeding began. No other complaints. Denies CP, SOB, N/V/C or fever.

## 2018-04-12 NOTE — Consult Note (Signed)
Reason for Consult:epistaxis  Referring Physician: er  William Gibson is an 72 y.o. male.  HPI: hx of 6 days of epistaxis and still not stopped  Past Medical History:  Diagnosis Date  . GERD (gastroesophageal reflux disease)     Past Surgical History:  Procedure Laterality Date  . VASECTOMY    . VASECTOMY REVERSAL      Family History  Problem Relation Age of Onset  . Colon cancer Neg Hx     Social History:  reports that he has been smoking cigarettes. He has been smoking about 2.00 packs per day. He has never used smokeless tobacco. He reports current alcohol use of about 28.0 standard drinks of alcohol per week. He reports that he does not use drugs.  Allergies: No Known Allergies  Medications: I have reviewed the patient's current medications.  Results for orders placed or performed during the hospital encounter of 04/12/18 (from the past 48 hour(s))  CBC with Differential     Status: Abnormal   Collection Time: 04/12/18  1:37 PM  Result Value Ref Range   WBC 7.7 4.0 - 10.5 K/uL   RBC 4.03 (L) 4.22 - 5.81 MIL/uL   Hemoglobin 14.1 13.0 - 17.0 g/dL   HCT 43.1 39.0 - 52.0 %   MCV 106.9 (H) 80.0 - 100.0 fL   MCH 35.0 (H) 26.0 - 34.0 pg   MCHC 32.7 30.0 - 36.0 g/dL   RDW 12.3 11.5 - 15.5 %   Platelets 206 150 - 400 K/uL   nRBC 0.0 0.0 - 0.2 %   Neutrophils Relative % 63 %   Neutro Abs 4.9 1.7 - 7.7 K/uL   Lymphocytes Relative 24 %   Lymphs Abs 1.8 0.7 - 4.0 K/uL   Monocytes Relative 9 %   Monocytes Absolute 0.7 0.1 - 1.0 K/uL   Eosinophils Relative 3 %   Eosinophils Absolute 0.2 0.0 - 0.5 K/uL   Basophils Relative 1 %   Basophils Absolute 0.1 0.0 - 0.1 K/uL   Immature Granulocytes 0 %   Abs Immature Granulocytes 0.02 0.00 - 0.07 K/uL    Comment: Performed at [facility], [place] [address], [place], [place] [address]  Protime-INR     Status: None   Collection Time: 04/12/18  1:37 PM  Result Value Ref Range   Prothrombin Time 12.1 11.4 - 15.2  seconds   INR 0.90     Comment: Performed at [facility], [place] [address], [place],  [address]    No results found.  Review of Systems  Constitutional: Negative.   HENT: Negative.   Eyes: Negative.   Respiratory: Negative.   Cardiovascular: Negative.   Skin: Negative.    Blood pressure 134/88, pulse (!) 111, temperature 97.9 F (36.6 C), temperature source Oral, resp. rate 18, height 5\' 9"  (1.753 m), weight 72.1 kg, SpO2 96 %. Physical Exam  Constitutional: He appears well-developed and well-nourished.  HENT:  Head: Normocephalic and atraumatic.  Mouth/Throat: Oropharynx is clear and moist.  Active bleeding  Eyes: Pupils are equal, round, and reactive to light. Conjunctivae are normal.  Neck: Normal range of motion. Neck supple.  Cardiovascular: Normal rate.  Respiratory: Effort normal.  GI: Soft.    Assessment/Plan: Epistaxis- I packed his nose with merocel sponge after afrin and lidocaine. He has active bleeding that has failed packing 3 times and now my packing. He needs to go to OR. Discussed nasal endoscopy and cautery and ready to proceed. Risks benefits  and options discussed. All questions answered and consent obtainred.   Melissa Montane 04/12/2018, 4:10 PM

## 2018-04-12 NOTE — ED Notes (Signed)
ED TO INPATIENT HANDOFF REPORT  Name/Age/Gender William Gibson 72 y.o. male  Code Status Advance Directive Documentation     Most Recent Value  Type of Advance Directive  Healthcare Power of [place]  Pre-existing out of facility DNR order (yellow form or pink MOST form)  -  "MOST" Form in Place?  -      Home/SNF/Other Home  Chief Complaint epistaxis  Level of Care/Admitting Diagnosis ED Disposition    ED Disposition Condition Comment   Admit  The patient appears reasonably stabilized for admission considering the current resources, flow, and capabilities available in the ED at this time, and I doubt any other [facility] requiring further screening and/or treatment in the ED prior to admission is  present.       Medical History Past Medical History:  Diagnosis Date  . GERD (gastroesophageal reflux disease)     Allergies No Known Allergies  IV Location/Drains/Wounds Patient Lines/Drains/Airways Status   Active Line/Drains/Airways    Name:   Placement date:   Placement time:   Site:   Days:   Peripheral IV 04/12/18 Right Antecubital   04/12/18    1611    Antecubital   less than 1          Labs/Imaging Results for orders placed or performed during the hospital encounter of 04/12/18 (from the past 48 hour(s))  CBC with Differential     Status: Abnormal   Collection Time: 04/12/18  1:37 PM  Result Value Ref Range   WBC 7.7 4.0 - 10.5 K/uL   RBC 4.03 (L) 4.22 - 5.81 MIL/uL   Hemoglobin 14.1 13.0 - 17.0 g/dL   HCT 43.1 39.0 - 52.0 %   MCV 106.9 (H) 80.0 - 100.0 fL   MCH 35.0 (H) 26.0 - 34.0 pg   MCHC 32.7 30.0 - 36.0 g/dL   RDW 12.3 11.5 - 15.5 %   Platelets 206 150 - 400 K/uL   nRBC 0.0 0.0 - 0.2 %   Neutrophils Relative % 63 %   Neutro Abs 4.9 1.7 - 7.7 K/uL   Lymphocytes Relative 24 %   Lymphs Abs 1.8 0.7 - 4.0 K/uL   Monocytes Relative 9 %   Monocytes Absolute 0.7 0.1 - 1.0 K/uL   Eosinophils Relative 3 %   Eosinophils Absolute 0.2 0.0 - 0.5 K/uL   Basophils Relative 1 %   Basophils Absolute 0.1 0.0 - 0.1 K/uL   Immature Granulocytes 0 %   Abs Immature Granulocytes 0.02 0.00 - 0.07 K/uL    Comment: Performed at [facility], [place] [address] [place], [place] [address]  Protime-INR     Status: None   Collection Time: 04/12/18  1:37 PM  Result Value Ref Range   Prothrombin Time 12.1 11.4 - 15.2 seconds   INR 0.90     Comment: Performed at [facility], [place] [address], [place], [place] [address]   No results found.  Pending Labs Unresulted Labs (From admission, onward)   None      Vitals/Pain Today's Vitals   04/12/18 1202 04/12/18 1205 04/12/18 1206 04/12/18 1332  BP:   134/88   Pulse:   (!) 111   Resp:   18   Temp:   97.9 F (36.6 C)   TempSrc:   Oral   SpO2:   96%   Weight: 72.1 kg     Height: 5\' 9"  (1.753 m)     PainSc: 0-No pain 0-No pain  0-No pain  Isolation Precautions No active isolations  Medications Medications  lidocaine (XYLOCAINE) 2 % viscous mouth solution 15 mL (15 mLs Mouth/Throat Given 04/12/18 1423)    Mobility walks with person assist

## 2018-04-12 NOTE — ED Provider Notes (Signed)
[facility] DEPT Provider Note   CSN: [id number] Arrival date & time: 04/12/18  1147     History   Chief Complaint Chief Complaint  Patient presents with  . Epistaxis    HPI TORSTEN WENIGER is a 72 y.o. male.  Pt presents to the ED today with nosebleed to left nare.  The pt has had a nosebleed on and off for the past 4 days.  He came to the ED yesterday and initially had a 4.5 cm nasal balloon placed.  This fell out, and a bigger one was placed.  Pt said he was fine last night and this morning.  He was just leaning over the sink when the bleeding started again.  It has stopped now.     Past Medical History:  Diagnosis Date  . GERD (gastroesophageal reflux disease)     Patient Active Problem List   Diagnosis Date Noted  . Left-sided epistaxis 04/11/2018  . RA (rheumatoid arthritis) ([place]) 08/17/2017  . Memory loss or impairment 08/17/2017  . Loss of appetite 12/15/2016  . Alcohol abuse 12/15/2016  . Osteoarthritis 01/30/2016  . GERD (gastroesophageal reflux disease) 05/23/2014  . Elevated LDL cholesterol level 05/22/2013  . Erectile dysfunction 05/22/2013  . Tobacco abuse 04/26/2012    Past Surgical History:  Procedure Laterality Date  . VASECTOMY    . VASECTOMY REVERSAL          Home Medications    Prior to Admission medications   Medication Sig Start Date End Date Taking? Authorizing Provider  meloxicam (MOBIC) 15 MG tablet Take 15 mg by mouth daily. 07/18/15  Yes [provider]  omeprazole (PRILOSEC) 20 MG capsule Take 1 capsule (20 mg total) by mouth daily. 05/23/14  Yes Lucille Passy, MD  predniSONE (DELTASONE) 5 MG tablet Take 5 mg by mouth daily as needed (arthritis).    Yes [provider]    Family History Family History  Problem Relation Age of Onset  . Colon cancer Neg Hx     Social History Social History   Tobacco Use  . Smoking status: Current Every Day Smoker    Packs/day: 2.00   Types: Cigarettes  . Smokeless tobacco: Never Used  Substance Use Topics  . Alcohol use: Yes    Alcohol/week: 28.0 standard drinks    Types: 28 Shots of liquor per week    Comment: moderate  . Drug use: No     Allergies   Patient has no known allergies.   Review of Systems Review of Systems  HENT: Positive for nosebleeds.   All other systems reviewed and are negative.    Physical Exam Updated Vital Signs BP 134/88 (BP Location: Left Arm)   Pulse (!) 111   Temp 97.9 F (36.6 C) (Oral)   Resp 18   Ht 5\' 9"  (1.753 m)   Wt 72.1 kg   SpO2 96%   BMI 23.47 kg/m   Physical Exam Vitals signs and nursing note reviewed.  Constitutional:      Appearance: Normal appearance.  HENT:     Head: Normocephalic and atraumatic.     Right Ear: External ear normal.     Left Ear: External ear normal.     Nose:     Comments: Left nare with nasal balloon.  No active bleeding.    Mouth/Throat:     Mouth: Mucous membranes are moist.     Pharynx: Oropharynx is clear.  Eyes:     Pupils: Pupils are  equal, round, and reactive to light.  Neck:     Musculoskeletal: Normal range of motion.  Cardiovascular:     Rate and Rhythm: Normal rate and regular rhythm.  Pulmonary:     Effort: Pulmonary effort is normal.     Breath sounds: Normal breath sounds.  Abdominal:     General: Abdomen is flat. Bowel sounds are normal.     Palpations: Abdomen is soft.  Musculoskeletal: Normal range of motion.  Skin:    General: Skin is warm.     Capillary Refill: Capillary refill takes less than 2 seconds.  Neurological:     General: No focal deficit present.     Mental Status: He is alert and oriented to person, place, and time.  Psychiatric:        Mood and Affect: Mood normal.        Behavior: Behavior normal.        Thought Content: Thought content normal.        Judgment: Judgment normal.      ED Treatments / Results  Labs (all labs ordered are listed, but only abnormal results are  displayed) Labs Reviewed  CBC WITH DIFFERENTIAL/PLATELET - Abnormal; Notable for the following components:      Result Value   RBC 4.03 (*)    MCV 106.9 (*)    MCH 35.0 (*)    All other components within normal limits  PROTIME-INR    EKG None  Radiology No results found.  Procedures .Epistaxis Management Date/Time: 04/12/2018 2:26 PM Performed by: Isla Pence, MD Authorized by: Isla Pence, MD   Consent:    Consent obtained:  Verbal   Consent given by:  Patient   Risks discussed:  Bleeding   Alternatives discussed:  No treatment Anesthesia (see MAR for exact dosages):    Anesthesia method:  Topical application   Topical anesthetic:  Lidocaine gel Procedure details:    Treatment site:  L anterior   Treatment method:  Nasal balloon   Treatment complexity:  Limited   Treatment episode: recurring   Post-procedure details:    Assessment:  No improvement   Patient tolerance of procedure:  Tolerated well, no immediate complications   (including critical care time)  Medications Ordered in ED Medications  lidocaine (XYLOCAINE) 2 % viscous mouth solution 15 mL (15 mLs Mouth/Throat Given 04/12/18 1423)     Initial Impression / Assessment and Plan / ED Course  I have reviewed the triage vital signs and the nursing notes.  Pertinent labs & imaging results that were available during my care of the patient were reviewed by me and considered in my medical decision making (see chart for details).     Pt d/w Dr. Janace Hoard (ENT) who will see pt. Dr. Janace Hoard will take pt to the OR.  Final Clinical Impressions(s) / ED Diagnoses   Final diagnoses:  Epistaxis    ED Discharge Orders    None       Isla Pence, MD 04/12/18 1610

## 2018-04-12 NOTE — ED Notes (Signed)
Bed: WA03 Expected date:  Expected time:  Means of arrival:  Comments: HALL C

## 2018-04-12 NOTE — Anesthesia Postprocedure Evaluation (Signed)
Anesthesia Post Note  Patient: William Gibson  Procedure(s) Performed: NASAL ENDOSCOPY WITH EPISTAXIS CONTROL (N/A )     Patient location during evaluation: PACU Anesthesia Type: General Level of consciousness: awake and alert Pain management: pain level controlled Vital Signs Assessment: post-procedure vital signs reviewed and stable Respiratory status: spontaneous breathing, nonlabored ventilation, respiratory function stable and patient connected to nasal cannula oxygen Cardiovascular status: blood pressure returned to baseline and stable Postop Assessment: no apparent nausea or vomiting Anesthetic complications: no    Last Vitals:  Vitals:   04/12/18 1840 04/12/18 1845  BP: (!) 160/101 (!) 160/94  Pulse: 94 92  Resp: 18 18  Temp: (!) 36.3 C (!) 36.3 C  SpO2: 91% 93%    Last Pain:  Vitals:   04/12/18 1845  TempSrc:   PainSc: 0-No pain                 Montez Hageman

## 2018-04-12 NOTE — Transfer of Care (Signed)
Immediate Anesthesia Transfer of Care Note  Patient: William Gibson  Procedure(s) Performed: NASAL ENDOSCOPY WITH EPISTAXIS CONTROL (N/A )  Patient Location: PACU  Anesthesia Type:General  Level of Consciousness: sedated  Airway & Oxygen Therapy: Patient Spontanous Breathing and Patient connected to face mask oxygen  Post-op Assessment: Report given to RN and Post -op Vital signs reviewed and stable  Post vital signs: Reviewed  Last Vitals:  Vitals Value Taken Time  BP    Temp    Pulse 94 04/12/2018  5:57 PM  Resp 16 04/12/2018  5:57 PM  SpO2 89 % 04/12/2018  5:57 PM  Vitals shown include unvalidated device data.  Last Pain:  Vitals:   04/12/18 1652  TempSrc:   PainSc: 0-No pain         Complications: No apparent anesthesia complications

## 2018-04-12 NOTE — Op Note (Signed)
Preop/postop diagnoses: Epistaxis Procedure: Nasal endoscopy with cautery of a posterior nosebleed and nasal packing Anesthesia: Gen. Estimated blood loss: Less than 10 mL Indications: 72 year old who's had epistaxis for 6 days and had 3 attempts at packing. He continues to bleed. He was instructed of the procedure above and risks, benefits, and options were discussed. All his questions are answered and consent was obtained. Procedure: Patient was taken the operating placed in the supine position after general endotracheal tube anesthesia was placed in the supine position draped in usual sterile manner. The Merocel sponge was removed from the left side. There was no active bleeding at that point. The clot was suctioned out from the posterior aspect of the nose. The inferior turbinate middle turbinate injected with 1% lidocaine with 1 100,000 epinephrine. Afrin-soakedpledgets were placed. Looking back into the very posterior aspect was blood dripping down from the backside of the middle turbinate. This was then further identified by moving the middle turbinate medially and there was an irritated area and bleeding along its backside and superior edge which was cauterized with suction cautery. Also the area of this sphenopalatine area was oozing slightly and it was cauterized with suction cautery.  There was also some bleeding coming from up at the superior aspect of the attachment ML turbinate which was cauterized. Surgicel was then packed into this entire location with bacitracin. A Merocel sponge was then placed also with bacitracin. The string was taped to the nose. There was no further bleeding. The patient was awake and brought to cover stable condition counts correct

## 2018-04-12 NOTE — Anesthesia Procedure Notes (Signed)
Date/Time: 04/12/2018 5:47 PM Performed by: Cynda Familia, CRNA Oxygen Delivery Method: Simple face mask Placement Confirmation: positive ETCO2 and breath sounds checked- equal and bilateral Dental Injury: Teeth and Oropharynx as per pre-operative assessment

## 2018-04-12 NOTE — Discharge Instructions (Addendum)
Call for appointment in 5 days. Call the office at [phone]. Come back to the [facility] emergency room if there's any further bleeding or call 911. Take the antibiotics Keflex 500 mg 3 times a day.  General Anesthesia, Adult, Care After These instructions provide you with information about caring for yourself after your procedure. Your health care provider may also give you more specific instructions. Your treatment has been planned according to current medical practices, but problems sometimes occur. Call your health care provider if you have any problems or questions after your procedure. What can I expect after the procedure? After the procedure, it is common to have:  Vomiting.  A sore throat.  Mental slowness.  It is common to feel:  Nauseous.  Cold or shivery.  Sleepy.  Tired.  Sore or achy, even in parts of your body where you did not have surgery.  Follow these instructions at home: For at least 24 hours after the procedure:  Do not: ? Participate in activities where you could fall or become injured. ? Drive. ? Use heavy machinery. ? Drink alcohol. ? Take sleeping pills or medicines that cause drowsiness. ? Make important decisions or sign legal documents. ? Take care of children on your own.  Rest. Eating and drinking  If you vomit, drink water, juice, or soup when you can drink without vomiting.  Drink enough fluid to keep your urine clear or pale yellow.  Make sure you have little or no nausea before eating solid foods.  Follow the diet recommended by your health care provider. General instructions  Have a responsible adult stay with you until you are awake and alert.  Return to your normal activities as told by your health care provider. Ask your health care provider what activities are safe for you.  Take over-the-counter and prescription medicines only as told by your health care provider.  If you smoke, do not smoke without supervision.  Keep  all follow-up visits as told by your health care provider. This is important. Contact a health care provider if:  You continue to have nausea or vomiting at home, and medicines are not helpful.  You cannot drink fluids or start eating again.  You cannot urinate after 8-12 hours.  You develop a skin rash.  You have fever.  You have increasing redness at the site of your procedure. Get help right away if:  You have difficulty breathing.  You have chest pain.  You have unexpected bleeding.  You feel that you are having a life-threatening or urgent problem. This information is not intended to replace advice given to you by your health care provider. Make sure you discuss any questions you have with your health care provider. Document Released: 07/20/2000 Document Revised: 09/16/2015 Document Reviewed: 03/28/2015 Elsevier Interactive Patient Education  [employer].

## 2018-04-12 NOTE — Anesthesia Procedure Notes (Signed)
Procedure Name: Intubation Date/Time: 04/12/2018 5:04 PM Performed by: Sharlette Dense, CRNA Patient Re-evaluated:Patient Re-evaluated prior to induction Oxygen Delivery Method: Circle system utilized Preoxygenation: Pre-oxygenation with 100% oxygen Induction Type: IV induction and Rapid sequence Laryngoscope Size: Miller and 3 Grade View: Grade I Tube type: Oral Tube size: 7.5 mm Number of attempts: 1 Airway Equipment and Method: Stylet Placement Confirmation: ETT inserted through vocal cords under direct vision,  positive ETCO2 and breath sounds checked- equal and bilateral Secured at: 21 cm Tube secured with: Tape Dental Injury: Teeth and Oropharynx as per pre-operative assessment

## 2018-04-12 NOTE — Anesthesia Preprocedure Evaluation (Addendum)
Anesthesia Evaluation  Patient identified by MRN, date of birth, ID band Patient awake    Reviewed: Allergy & Precautions, NPO status , Patient's Chart, lab work & pertinent test results  Airway Mallampati: II  TM Distance: >3 FB Neck ROM: Full    Dental no notable dental hx.    Pulmonary Current Smoker,    Pulmonary exam normal breath sounds clear to auscultation       Cardiovascular negative cardio ROS Normal cardiovascular exam Rhythm:Regular Rate:Normal     Neuro/Psych negative neurological ROS  negative psych ROS   GI/Hepatic negative GI ROS, (+)     substance abuse  alcohol use,   Endo/Other  negative endocrine ROS  Renal/GU negative Renal ROS  negative genitourinary   Musculoskeletal negative musculoskeletal ROS (+)   Abdominal   Peds negative pediatric ROS (+)  Hematology negative hematology ROS (+)   Anesthesia Other Findings   Reproductive/Obstetrics negative OB ROS                            Anesthesia Physical Anesthesia Plan  ASA: III and emergent  Anesthesia Plan: General   Post-op Pain Management:    Induction: Intravenous, Rapid sequence and Cricoid pressure planned  PONV Risk Score and Plan: 1  Airway Management Planned: Oral ETT  Additional Equipment:   Intra-op Plan:   Post-operative Plan: Extubation in OR  Informed Consent: I have reviewed the patients History and Physical, chart, labs and discussed the procedure including the risks, benefits and alternatives for the proposed anesthesia with the patient or authorized representative who has indicated his/her understanding and acceptance.   Dental advisory given  Plan Discussed with: CRNA  Anesthesia Plan Comments:        Anesthesia Quick Evaluation

## 2018-04-13 ENCOUNTER — Ambulatory Visit: Payer: 59 | Admitting: Family Medicine

## 2018-04-13 ENCOUNTER — Encounter (HOSPITAL_COMMUNITY): Payer: Self-pay | Admitting: Otolaryngology

## 2018-04-14 ENCOUNTER — Encounter: Payer: Self-pay | Admitting: Family Medicine

## 2018-04-14 ENCOUNTER — Ambulatory Visit (INDEPENDENT_AMBULATORY_CARE_PROVIDER_SITE_OTHER): Payer: 59 | Admitting: Family Medicine

## 2018-04-14 VITALS — BP 124/64 | HR 105 | Temp 97.3°F | Ht 69.0 in | Wt 153.6 lb

## 2018-04-14 DIAGNOSIS — Z09 Encounter for follow-up examination after completed treatment for conditions other than malignant neoplasm: Secondary | ICD-10-CM

## 2018-04-14 DIAGNOSIS — R110 Nausea: Secondary | ICD-10-CM

## 2018-04-14 DIAGNOSIS — R040 Epistaxis: Secondary | ICD-10-CM

## 2018-04-14 MED ORDER — OXYCODONE-ACETAMINOPHEN 5-325 MG PO TABS: 1.0000 | ORAL_TABLET | Freq: Four times a day (QID) | ORAL | 0 refills | Status: DC | PRN

## 2018-04-14 MED ORDER — PROMETHAZINE HCL 25 MG/ML IJ SOLN
25.0000 mg | Freq: Once | INTRAMUSCULAR | Status: AC
  Administered 2018-04-14: 25 mg via INTRAMUSCULAR

## 2018-04-14 NOTE — Assessment & Plan Note (Signed)
Refractory to multiple attempts at cautery and nasal balloons. S/p nasal endoscopy with cautery of posterior nosebleed and nasal packing in OR under general anesthesia by Dr. Janace Hoard on 04/12/18.  He has follow up appointment scheduled with ENT.  Finish course of abx.  He is in severe pain- given rx for percocet- appropriate in this scenario.  Discussed sedation precautions. The patient indicates understanding of these issues and agrees with the plan.

## 2018-04-14 NOTE — Patient Instructions (Signed)
Great to see you.  Please update me tomorrow.

## 2018-04-14 NOTE — Progress Notes (Signed)
Subjective:   Patient ID: William Gibson, male    DOB: Jul 30, 1945, 72 y.o.   MRN: [id number]  William Gibson is a pleasant 72 y.o. year old male who presents to clinic today with Hospitalization Follow-up (Patient was last seen on 12.16.19 for left sided epistaxis x4-5d which was cauterized. Pt advied to go to ED for nose bleed lasting 78min and to F/U in 2d.  He was seen at [facility] on 12.16.19 for Acute Anterior Epistaxis.  A 4.5cm nasal balloon placed and at D/C pt advised to F/U with Dr. Blenda Nicely with Otolaryngology on 12.18.19 then PCP.  On 12.17.19 patient was again seen at [facility] and had nasal endoscopy with cautery of a posterior nosebleed and nasal packaging and started on Keflex 500mg  tid.) and addendum (Wife will call and schedule appt with Dr. Janace Hoard on Monday.  He states "It hurts like hell, my face is so sore I can't even wear my false teeth and they never gave me nothin."  He rates his pain as a 6 and I asked if he responds well to Vicodin and he said "I don't know but just nothing that will make me bleed." Wife is wondering if he needs to be taking oral Fe. Aware of last labs done here.)  on 04/14/2018  HPI:  Here for hospital follow up. Notes reviewed- was seen here and in ED several times.  Left nare epistaxis was unfortunately refractory to both cautery and nasal balloons (multiple attempts).  ENT was consulted and Dr. Janace Hoard performed nasal endoscopy with cautery of posterior nosebleed and nasal packing in OR, under general anesthesia on 04/12/18.  Note reviewed.  He was discharged home on Keflex 500 mg three times daily which he is taking.  His wife made a follow up appointment for pt with Dr. Janace Hoard for next Monday while in the office.  William Gibson is in severe pain.  He has been unable to eat or sleep due to the pain. Has lost 5 pounds in 2 days.  He rates his pain right now as a 6 but last night or anytime he tries to move his face, it becomes a 10.  No further epistaxis that he  can see- packing still in place.  Lab Results  Component Value Date   WBC 7.7 04/12/2018   HGB 14.1 04/12/2018   HCT 43.1 04/12/2018   MCV 106.9 (H) 04/12/2018   PLT 206 04/12/2018        Current Outpatient Medications on File Prior to Visit  Medication Sig Dispense Refill  . cephALEXin (KEFLEX) 500 MG capsule Take 1 capsule (500 mg total) by mouth 3 (three) times daily. 15 capsule 0  . meloxicam (MOBIC) 15 MG tablet Take 15 mg by mouth daily.  2  . omeprazole (PRILOSEC) 20 MG capsule Take 1 capsule (20 mg total) by mouth daily. (Patient not taking: Reported on 04/14/2018) 90 capsule 3  . predniSONE (DELTASONE) 5 MG tablet Take 5 mg by mouth daily as needed (arthritis).      No current facility-administered medications on file prior to visit.     No Known Allergies  Past Medical History:  Diagnosis Date  . Epistaxis   . GERD (gastroesophageal reflux disease)     Past Surgical History:  Procedure Laterality Date  . NASAL ENDOSCOPY WITH EPISTAXIS CONTROL N/A 04/12/2018   Procedure: NASAL ENDOSCOPY WITH EPISTAXIS CONTROL;  Surgeon: Melissa Montane, MD;  Location: [facility];  Service: ENT;  Laterality: N/A;  . VASECTOMY    .  VASECTOMY REVERSAL      Family History  Problem Relation Age of Onset  . Colon cancer Neg Hx     Social History   Socioeconomic History  . Marital status: Married    Spouse name: Not on file  . Number of children: Not on file  . Years of education: Not on file  . Highest education level: Not on file  Occupational History  . Not on file  Social Needs  . Financial resource strain: Not on file  . Food insecurity:    Worry: Not on file    Inability: Not on file  . Transportation needs:    Medical: Not on file    Non-medical: Not on file  Tobacco Use  . Smoking status: Current Every Day Smoker    Packs/day: 2.00    Types: Cigarettes  . Smokeless tobacco: Never Used  Substance and Sexual Activity  . Alcohol use: Yes    Alcohol/week: 28.0  standard drinks    Types: 28 Shots of liquor per week    Comment: moderate; 2 drinks last evening   . Drug use: No  . Sexual activity: Not on file  Lifestyle  . Physical activity:    Days per week: Not on file    Minutes per session: Not on file  . Stress: Not on file  Relationships  . Social connections:    Talks on phone: Not on file    Gets together: Not on file    Attends religious service: Not on file    Active member of club or organization: Not on file    Attends meetings of clubs or organizations: Not on file    Relationship status: Not on file  . Intimate partner violence:    Fear of current or ex partner: Not on file    Emotionally abused: Not on file    Physically abused: Not on file    Forced sexual activity: Not on file  Other Topics Concern  . Not on file  Social History Narrative   Does not have a living will.   Desires CPR, does not want prolonged life support if futile.            The PMH, PSH, Social History, Family History, Medications, and allergies have been reviewed in [facility], and have been updated if relevant.   Review of Systems  Constitutional: Negative for chills and fever.  HENT: Positive for facial swelling, sinus pressure and sinus pain.   Gastrointestinal: Positive for nausea.  Neurological: Negative for dizziness.  All other systems reviewed and are negative.      Objective:    BP 124/64 (BP Location: Right Arm, Patient Position: Sitting, Cuff Size: Normal)   Pulse (!) 105   Temp (!) 97.3 F (36.3 C) (Oral)   Ht 5\' 9"  (1.753 m)   Wt 153 lb 9.6 oz (69.7 kg)   SpO2 96%   BMI 22.68 kg/m   Wt Readings from Last 3 Encounters:  04/14/18 153 lb 9.6 oz (69.7 kg)  04/12/18 158 lb 15.2 oz (72.1 kg)  04/11/18 159 lb (72.1 kg)    Physical Exam Vitals signs and nursing note reviewed.  Constitutional:      Comments: Appears very uncomfortable, in obvious pain  HENT:     Head:     Comments: Left side of face bruised, swollen, packing in  place- left nare, no warmth around area or drainage    Mouth/Throat:     Mouth: Mucous membranes are  moist.  Eyes:     Extraocular Movements: Extraocular movements intact.  Neck:     Musculoskeletal: Normal range of motion.  Cardiovascular:     Rate and Rhythm: Tachycardia present.  Pulmonary:     Effort: Pulmonary effort is normal.  Musculoskeletal: Normal range of motion.  Skin:    General: Skin is warm.  Neurological:     General: No focal deficit present.     Mental Status: He is alert.  Psychiatric:        Mood and Affect: Mood normal.           Assessment & Plan:   Hospital discharge follow-up - Plan: promethazine (PHENERGAN) injection 25 mg  Nausea - Plan: promethazine (PHENERGAN) injection 25 mg No follow-ups on file.

## 2018-04-15 ENCOUNTER — Encounter: Payer: Self-pay | Admitting: Family Medicine

## 2018-04-18 DIAGNOSIS — R040 Epistaxis: Secondary | ICD-10-CM | POA: Diagnosis not present

## 2018-04-28 DIAGNOSIS — R040 Epistaxis: Secondary | ICD-10-CM | POA: Diagnosis not present

## 2018-05-19 ENCOUNTER — Encounter: Payer: Self-pay | Admitting: Family Medicine

## 2018-05-19 ENCOUNTER — Ambulatory Visit (INDEPENDENT_AMBULATORY_CARE_PROVIDER_SITE_OTHER): Payer: 59 | Admitting: Family Medicine

## 2018-05-19 VITALS — BP 142/94 | HR 72 | Temp 97.5°F | Ht 69.0 in | Wt 163.8 lb

## 2018-05-19 DIAGNOSIS — Z720 Tobacco use: Secondary | ICD-10-CM

## 2018-05-19 DIAGNOSIS — Z0000 Encounter for general adult medical examination without abnormal findings: Secondary | ICD-10-CM

## 2018-05-19 DIAGNOSIS — Z125 Encounter for screening for malignant neoplasm of prostate: Secondary | ICD-10-CM

## 2018-05-19 DIAGNOSIS — N529 Male erectile dysfunction, unspecified: Secondary | ICD-10-CM | POA: Diagnosis not present

## 2018-05-19 DIAGNOSIS — M069 Rheumatoid arthritis, unspecified: Secondary | ICD-10-CM

## 2018-05-19 DIAGNOSIS — E7800 Pure hypercholesterolemia, unspecified: Secondary | ICD-10-CM

## 2018-05-19 DIAGNOSIS — K219 Gastro-esophageal reflux disease without esophagitis: Secondary | ICD-10-CM | POA: Diagnosis not present

## 2018-05-19 DIAGNOSIS — M159 Polyosteoarthritis, unspecified: Secondary | ICD-10-CM | POA: Diagnosis not present

## 2018-05-19 LAB — COMPREHENSIVE METABOLIC PANEL
ALBUMIN: 3.8 g/dL (ref 3.5–5.2)
ALT: 19 U/L (ref 0–53)
AST: 32 U/L (ref 0–37)
Alkaline Phosphatase: 88 U/L (ref 39–117)
BUN: 13 mg/dL (ref 6–23)
CO2: 29 mEq/L (ref 19–32)
Calcium: 9.7 mg/dL (ref 8.4–10.5)
Chloride: 101 mEq/L (ref 96–112)
Creatinine, Ser: 0.76 mg/dL (ref 0.40–1.50)
GFR: 100.53 mL/min (ref >60.00)
Glucose, Bld: 107 mg/dL — ABNORMAL HIGH (ref 70–99)
POTASSIUM: 4.6 meq/L (ref 3.5–5.1)
Sodium: 137 mEq/L (ref 135–145)
TOTAL PROTEIN: 8 g/dL (ref 6.0–8.3)
Total Bilirubin: 0.5 mg/dL (ref 0.2–1.2)

## 2018-05-19 LAB — CBC WITH DIFFERENTIAL/PLATELET
Basophils Absolute: 0.1 10*3/uL (ref 0.0–0.1)
Basophils Relative: 1 % (ref 0.0–3.0)
Eosinophils Absolute: 0.2 10*3/uL (ref 0.0–0.7)
Eosinophils Relative: 3.4 % (ref 0.0–5.0)
HCT: 43.7 % (ref 39.0–52.0)
Hemoglobin: 14.6 g/dL (ref 13.0–17.0)
Lymphocytes Relative: 25.2 % (ref 12.0–46.0)
Lymphs Abs: 1.7 10*3/uL (ref 0.7–4.0)
MCHC: 33.5 g/dL (ref 30.0–36.0)
MCV: 102.2 fl — ABNORMAL HIGH (ref 78.0–100.0)
Monocytes Absolute: 0.6 10*3/uL (ref 0.1–1.0)
Monocytes Relative: 8.2 % (ref 3.0–12.0)
NEUTROS ABS: 4.3 10*3/uL (ref 1.4–7.7)
Neutrophils Relative %: 62.2 % (ref 43.0–77.0)
PLATELETS: 292 10*3/uL (ref 150.0–400.0)
RBC: 4.27 Mil/uL (ref 4.22–5.81)
RDW: 14 % (ref 11.5–15.5)
WBC: 6.9 10*3/uL (ref 4.0–10.5)

## 2018-05-19 LAB — TSH: TSH: 1.64 u[IU]/mL (ref 0.35–4.50)

## 2018-05-19 LAB — VITAMIN D 25 HYDROXY (VIT D DEFICIENCY, FRACTURES): VITD: 31.17 ng/mL (ref 30.00–100.00)

## 2018-05-19 LAB — PSA: PSA: 6.23 ng/mL — ABNORMAL HIGH (ref 0.10–4.00)

## 2018-05-19 LAB — LIPID PANEL
Cholesterol: 257 mg/dL — ABNORMAL HIGH (ref 0–200)
HDL: 65.1 mg/dL (ref >39.00)
LDL Cholesterol: 170 mg/dL — ABNORMAL HIGH (ref 0–99)
NonHDL: 191.81
TRIGLYCERIDES: 109 mg/dL (ref 0.0–149.0)
Total CHOL/HDL Ratio: 4
VLDL: 21.8 mg/dL (ref 0.0–40.0)

## 2018-05-19 LAB — SEDIMENTATION RATE: Sed Rate: 125 mm/hr — ABNORMAL HIGH (ref 0–20)

## 2018-05-19 NOTE — Progress Notes (Signed)
Subjective:   Patient ID: William Gibson, male    DOB: Jan 18, 1946, 73 y.o.   MRN: [id number]  William Gibson is a pleasant 73 y.o. year old male who presents to clinic today with Follow-up (Patient is here today for a follow-up.  He has not had a BMD scan. He states he has no concerns today. ) and Annual Exam  on 05/19/2018  HPI:  Health Maintenance  Topic Date Due  . PNA vac Low Risk Adult (1 of 2 - PCV13) 06/01/2018 (Originally 05/04/2010)  . TETANUS/TDAP  04/12/2019 (Originally 05/04/1964)  . Hepatitis C Screening  04/12/2019 (Originally 10-20-1945)  . COLONOSCOPY  06/29/2023  . INFLUENZA VACCINE  Discontinued   Colonoscopy 06/29/13- [facility]- 10 year recall  Lab Results  Component Value Date   CHOL 214 (H) 12/15/2016   HDL 54.80 12/15/2016   LDLCALC 136 (H) 12/15/2016   LDLDIRECT 143.8 05/15/2013   TRIG 114.0 12/15/2016   CHOLHDL 4 12/15/2016     Overall doing well.  Retired now.  Very active, caring for his 63 year old grandson and really enjoying it.  RA- hands, feet, back.  No longer taking rheumatologist because is pain is much better.  Currently taking meloxicam and as needed prednisone.  Humira eRx was sent but he never started it because of the side effects.  He has not needed to take prednisone in over 3 months.  PMH significant for smoking 2 ppd for over 50 years.  Has never quit smoking.  Not ready to quit.    Current Outpatient Medications on File Prior to Visit  Medication Sig Dispense Refill  . meloxicam (MOBIC) 15 MG tablet Take 15 mg by mouth daily.  2  . omeprazole (PRILOSEC) 20 MG capsule Take 1 capsule (20 mg total) by mouth daily. 90 capsule 3  . cephALEXin (KEFLEX) 500 MG capsule Take 1 capsule (500 mg total) by mouth 3 (three) times daily. (Patient not taking: Reported on 05/19/2018) 15 capsule 0  . oxyCODONE-acetaminophen (PERCOCET/ROXICET) 5-325 MG tablet Take 1 tablet by mouth every 6 (six) hours as needed for severe pain. (Patient not taking: Reported  on 05/19/2018) 30 tablet 0  . predniSONE (DELTASONE) 5 MG tablet Take 5 mg by mouth daily as needed (arthritis).      No current facility-administered medications on file prior to visit.     No Known Allergies  Past Medical History:  Diagnosis Date  . Epistaxis   . GERD (gastroesophageal reflux disease)     Past Surgical History:  Procedure Laterality Date  . NASAL ENDOSCOPY WITH EPISTAXIS CONTROL N/A 04/12/2018   Procedure: NASAL ENDOSCOPY WITH EPISTAXIS CONTROL;  Surgeon: Melissa Montane, MD;  Location: [facility];  Service: ENT;  Laterality: N/A;  . VASECTOMY    . VASECTOMY REVERSAL      Family History  Problem Relation Age of Onset  . Colon cancer Neg Hx     Social History   Socioeconomic History  . Marital status: Married    Spouse name: Not on file  . Number of children: Not on file  . Years of education: Not on file  . Highest education level: Not on file  Occupational History  . Not on file  Social Needs  . Financial resource strain: Not on file  . Food insecurity:    Worry: Not on file    Inability: Not on file  . Transportation needs:    Medical: Not on file    Non-medical: Not on file  Tobacco Use  . Smoking status: Current Every Day Smoker    Packs/day: 2.00    Types: Cigarettes  . Smokeless tobacco: Never Used  Substance and Sexual Activity  . Alcohol use: Yes    Alcohol/week: 28.0 standard drinks    Types: 28 Shots of liquor per week    Comment: moderate; 2 drinks last evening   . Drug use: No  . Sexual activity: Not on file  Lifestyle  . Physical activity:    Days per week: Not on file    Minutes per session: Not on file  . Stress: Not on file  Relationships  . Social connections:    Talks on phone: Not on file    Gets together: Not on file    Attends religious service: Not on file    Active member of club or organization: Not on file    Attends meetings of clubs or organizations: Not on file    Relationship status: Not on file  . Intimate  partner violence:    Fear of current or ex partner: Not on file    Emotionally abused: Not on file    Physically abused: Not on file    Forced sexual activity: Not on file  Other Topics Concern  . Not on file  Social History Narrative   Does not have a living will.   Desires CPR, does not want prolonged life support if futile.            The PMH, PSH, Social History, Family History, Medications, and allergies have been reviewed in [facility], and have been updated if relevant.    Review of Systems  Constitutional: Negative.   HENT: Negative.   Eyes: Negative.   Respiratory: Negative.   Cardiovascular: Negative.   Gastrointestinal: Negative.   Endocrine: Negative.   Genitourinary: Negative.   Musculoskeletal: Negative.   Skin: Negative.   Allergic/Immunologic: Negative.   Neurological: Negative.   Hematological: Negative.   Psychiatric/Behavioral: Negative.   All other systems reviewed and are negative.      Objective:    BP (!) 142/94   Pulse 72   Temp (!) 97.5 F (36.4 C) (Oral)   Ht _0  (1.753 m)   Wt 163 lb 12.8 oz (74.3 kg)   SpO2 100%   BMI 24.19 kg/m   Wt Readings from Last 3 Encounters:  05/19/18 163 lb 12.8 oz (74.3 kg)  04/14/18 153 lb 9.6 oz (69.7 kg)  04/12/18 158 lb 15.2 oz (72.1 kg)     Physical Exam  General:  pleasant male in no acute distress Eyes:  PERRL Ears:  External ear exam shows no significant lesions or deformities.  TMs normal bilaterally Hearing is grossly normal bilaterally. Nose:  External nasal examination shows no deformity or inflammation. Nasal mucosa are pink and moist without lesions or exudates. Mouth:  Oral mucosa and oropharynx without lesions or exudates.  Teeth in good repair. Neck:  no carotid bruit or thyromegaly no cervical or supraclavicular lymphadenopathy  Lungs:  Normal respiratory effort, chest expands symmetrically. Lungs are clear to auscultation, no crackles or wheezes. Heart:  Normal rate and regular  rhythm. S1 and S2 normal without gallop, murmur, click, rub or other extra sounds. Abdomen:  Bowel sounds positive,abdomen soft and non-tender without masses, organomegaly or hernias noted. Pulses:  R and L posterior tibial pulses are full and equal bilaterally  Extremities:  no edema  Psych:  Good eye contact, not anxious or depressed appearing  Assessment & Plan:   Rheumatoid arthritis, involving unspecified site, unspecified rheumatoid factor presence ([place]) - Plan: Rheumatoid Factor, Antinuclear Antib (ANA), Sedimentation rate  Osteoarthritis of multiple joints, unspecified osteoarthritis type - Plan: VITAMIN D 25 Hydroxy (Vit-D Deficiency, Fractures)  Erectile dysfunction, unspecified erectile dysfunction type - Plan: PSA  Elevated LDL cholesterol level - Plan: Comp Met (CMET), Lipid Profile, CBC w/Diff  Gastroesophageal reflux disease, esophagitis presence not specified - Plan: CBC w/Diff, TSH  Screening for malignant neoplasm of prostate - Plan: PSA No follow-ups on file.

## 2018-05-19 NOTE — Assessment & Plan Note (Signed)
Reviewed preventive care protocols, scheduled due services, and updated immunizations Discussed nutrition, exercise, diet, and healthy lifestyle.  

## 2018-05-19 NOTE — Assessment & Plan Note (Signed)
Well controlled with meloxicam. No longer followed by rheumologist.

## 2018-05-19 NOTE — Patient Instructions (Signed)
Great to see you. I will call you with your lab results from today and you can view them online.   We are referring you to the lung cancer screening clinic.  Someone will call you with that appointmnet.

## 2018-05-19 NOTE — Assessment & Plan Note (Signed)
Not ready to quit but he is willing to be referring for lung cancer screening. Referral placed.

## 2018-05-20 ENCOUNTER — Other Ambulatory Visit: Payer: Self-pay | Admitting: Family Medicine

## 2018-05-20 DIAGNOSIS — R972 Elevated prostate specific antigen [PSA]: Secondary | ICD-10-CM

## 2018-05-20 LAB — RHEUMATOID FACTOR: Rheumatoid fact SerPl-aCnc: 647 IU/mL — ABNORMAL HIGH (ref <14)

## 2018-05-20 LAB — ANA: Anti Nuclear Antibody(ANA): NEGATIVE

## 2018-05-25 ENCOUNTER — Other Ambulatory Visit: Payer: Self-pay | Admitting: Acute Care

## 2018-05-25 DIAGNOSIS — Z87891 Personal history of nicotine dependence: Secondary | ICD-10-CM

## 2018-05-25 DIAGNOSIS — F17210 Nicotine dependence, cigarettes, uncomplicated: Secondary | ICD-10-CM

## 2018-05-25 DIAGNOSIS — Z122 Encounter for screening for malignant neoplasm of respiratory organs: Secondary | ICD-10-CM

## 2018-05-30 ENCOUNTER — Telehealth: Payer: Self-pay | Admitting: Family Medicine

## 2018-05-30 NOTE — Telephone Encounter (Signed)
Copied from [place] [id number]. Topic: Quick Communication - See Telephone Encounter >> May 30, 2018  7:58 AM Rayann Heman wrote: CRM for notification. See Telephone encounter for: 05/30/18. Pt daughter [name] wood called and states that she would like a call back from the clinical team about pt. Daughter states that she would like to just go over labs and possible talk to Dr Deborra Medina because pt has been super tired and in pain. Please advise.

## 2018-06-01 ENCOUNTER — Telehealth: Payer: Self-pay

## 2018-06-01 NOTE — Telephone Encounter (Signed)
Copied from [place] [phone]. Topic: General - Other >> Jun 01, 2018  2:43 PM Bea Graff, NT wrote: Reason for CRM: Pts wife states she emailed [name] F a physicians form and would like to make sure [name] received the e-mail. Please advise.

## 2018-06-02 NOTE — Telephone Encounter (Signed)
I received form and printed/filled out and TA signed/faxed both their forms in/prepared and placed up front for her to P/U/sent her an e-mail response stating this/thx dmf

## 2018-06-02 NOTE — Telephone Encounter (Signed)
Dr. Deborra Medina called as Blanchard Mane is on DPR/thx dmf

## 2018-06-15 ENCOUNTER — Ambulatory Visit (INDEPENDENT_AMBULATORY_CARE_PROVIDER_SITE_OTHER): Payer: 59 | Admitting: Acute Care

## 2018-06-15 ENCOUNTER — Encounter: Payer: Self-pay | Admitting: Acute Care

## 2018-06-15 ENCOUNTER — Ambulatory Visit (INDEPENDENT_AMBULATORY_CARE_PROVIDER_SITE_OTHER)
Admission: RE | Admit: 2018-06-15 | Discharge: 2018-06-15 | Disposition: A | Discharge status: Home / Self Care | Payer: 59 | Source: Ambulatory Visit | Attending: Acute Care | Admitting: Acute Care

## 2018-06-15 VITALS — BP 120/64 | HR 105 | Ht 69.0 in | Wt 167.8 lb

## 2018-06-15 DIAGNOSIS — F17210 Nicotine dependence, cigarettes, uncomplicated: Secondary | ICD-10-CM

## 2018-06-15 DIAGNOSIS — Z87891 Personal history of nicotine dependence: Secondary | ICD-10-CM

## 2018-06-15 DIAGNOSIS — Z122 Encounter for screening for malignant neoplasm of respiratory organs: Secondary | ICD-10-CM

## 2018-06-15 NOTE — Progress Notes (Signed)
Shared Decision Making Visit Lung Cancer Screening Program [phone])   Eligibility:  Age 73 y.o.  Pack Years Smoking History Calculation 101 pack year smoking history (# packs/per year x # years smoked)  Recent History of coughing up blood  no  Unexplained weight loss? no ( >Than 15 pounds within the last 6 months )  Prior History Lung / other cancer no (Diagnosis within the last 5 years already requiring surveillance chest CT Scans).  Smoking Status Current Smoker  Former Smokers: Years since quit: NA  Quit Date: NA  Visit Components:  Discussion included one or more decision making aids. yes  Discussion included risk/benefits of screening. yes  Discussion included potential follow up diagnostic testing for abnormal scans. yes  Discussion included meaning and risk of over diagnosis. yes  Discussion included meaning and risk of False Positives. yes  Discussion included meaning of total radiation exposure. yes  Counseling Included:  Importance of adherence to annual lung cancer LDCT screening. yes  Impact of comorbidities on ability to participate in the program. yes  Ability and willingness to under diagnostic treatment. yes  Smoking Cessation Counseling:  Current Smokers:   Discussed importance of smoking cessation. yes  Information about tobacco cessation classes and interventions provided to patient. yes  Patient provided with "ticket" for LDCT Scan. yes  Symptomatic Patient. no  Counseling: NA  Diagnosis Code: Tobacco Use Z72.0  Asymptomatic Patient yes  Counseling (Intermediate counseling: > three minutes counseling) L8756  Former Smokers:   Discussed the importance of maintaining cigarette abstinence. yes  Diagnosis Code: Personal History of Nicotine Dependence. E33.295  Information about tobacco cessation classes and interventions provided to patient. Yes  Patient provided with "ticket" for LDCT Scan. yes  Written Order for Lung Cancer  Screening with LDCT placed in Epic. Yes (CT Chest Lung Cancer Screening Low Dose W/O CM) [id number] Z12.2-Screening of respiratory organs Z87.891-Personal history of nicotine dependence  I have spent 25 minutes of face to face time with Mr. Diveley discussing the risks and benefits of lung cancer screening. We viewed a power point together that explained in detail the above noted topics. We paused at intervals to allow for questions to be asked and answered to ensure understanding.We discussed that the single most powerful action that he can take to decrease his risk of developing lung cancer is to quit smoking. We discussed whether or not he is ready to commit to setting a quit date. We discussed options for tools to aid in quitting smoking including nicotine replacement therapy, non-nicotine medications, support groups, Quit Smart classes, and behavior modification. We discussed that often times setting smaller, more achievable goals, such as eliminating 1 cigarette a day for a week and then 2 cigarettes a day for a week can be helpful in slowly decreasing the number of cigarettes smoked. This allows for a sense of accomplishment as well as providing a clinical benefit. I gave him the " Be Stronger Than Your Excuses" card with contact information for community resources, classes, free nicotine replacement therapy, and access to mobile apps, text messaging, and on-line smoking cessation help. I have also given him my card and contact information in the event he needs to contact me. We discussed the time and location of the scan, and that either Doroteo Glassman RN or I will call with the results within 24-48 hours of receiving them. I have offered him  a copy of the power point we viewed  as a resource in the event they need reinforcement  of the concepts we discussed today in the office. The patient verbalized understanding of all of  the above and had no further questions upon leaving the office. They have my  contact information in the event they have any further questions.  I spent 3 minutes counseling on smoking cessation and the health risks of continued tobacco abuse.  I explained to the patient that there has been a high incidence of coronary artery disease noted on these exams. I explained that this is a non-gated exam therefore degree or severity cannot be determined. This patient is not on statin therapy. I have asked the patient to follow-up with their PCP regarding any incidental finding of coronary artery disease and management with diet or medication as their PCP  feels is clinically indicated. The patient verbalized understanding of the above and had no further questions upon completion of the visit.      Magdalen Spatz, NP 06/15/2018 10:54 AM

## 2018-06-16 ENCOUNTER — Other Ambulatory Visit: Payer: Self-pay | Admitting: Acute Care

## 2018-06-16 DIAGNOSIS — F17210 Nicotine dependence, cigarettes, uncomplicated: Secondary | ICD-10-CM

## 2018-06-16 DIAGNOSIS — Z122 Encounter for screening for malignant neoplasm of respiratory organs: Secondary | ICD-10-CM

## 2018-06-16 DIAGNOSIS — Z87891 Personal history of nicotine dependence: Secondary | ICD-10-CM

## 2018-06-17 NOTE — Progress Notes (Signed)
06/21/2018  2:51 PM   Particia Gibson 03-Dec-1945 [id number]  Referring provider: Lucille Passy, MD [place], [place] [address]  Chief Complaint  Patient presents with  . Elevated PSA    HPI: William Gibson is a 73 y.o. White or Caucasian male that presents today in consultation with Dr. Arnette Norris for elevated PSA.  Patient denies any bothersome urinary symptoms at this time. Denies frequency, urgency and/or gross hematuria.   No history of UTIs. Paternal family history of prostate cancer, diagnosed at age 2 with multiple comorbidities and died shortly after from one of the latter.  PSA Trend 1.11 in 03/2012 1.45 in 04/2013 1.77 in 04/2014 2.35 in 12/2015 2.14 in 11/2016 6.23 in 04/2018  PMH: Past Medical History:  Diagnosis Date  . Epistaxis   . GERD (gastroesophageal reflux disease)     Surgical History: Past Surgical History:  Procedure Laterality Date  . NASAL ENDOSCOPY WITH EPISTAXIS CONTROL N/A 04/12/2018   Procedure: NASAL ENDOSCOPY WITH EPISTAXIS CONTROL;  Surgeon: Melissa Montane, MD;  Location: [facility];  Service: ENT;  Laterality: N/A;  . VASECTOMY    . VASECTOMY REVERSAL      Home Medications:  Allergies as of 06/21/2018   No Known Allergies     Medication List       Accurate as of June 21, 2018  2:51 PM. Always use your most recent med list.        meloxicam 15 MG tablet Commonly known as:  MOBIC Take 15 mg by mouth daily.   omeprazole 20 MG capsule Commonly known as:  PRILOSEC Take 1 capsule (20 mg total) by mouth daily.   predniSONE 5 MG tablet Commonly known as:  DELTASONE Take 5 mg by mouth daily as needed (arthritis).       Allergies: No Known Allergies  Family History: Family History  Problem Relation Age of Onset  . Colon cancer Neg Hx     Social History:  reports that he has been smoking cigarettes. He has a 101.50 pack-year smoking history. He has never used smokeless tobacco. He reports current alcohol  use of about 28.0 standard drinks of alcohol per week. He reports that he does not use drugs.  ROS: UROLOGY Frequent Urination?: No Hard to postpone urination?: No Burning/pain with urination?: No Get up at night to urinate?: No Leakage of urine?: No Urine stream starts and stops?: No Trouble starting stream?: No Do you have to strain to urinate?: No Blood in urine?: No Urinary tract infection?: No Sexually transmitted disease?: No Injury to kidneys or bladder?: No Painful intercourse?: No Weak stream?: No Erection problems?: No Penile pain?: No  Gastrointestinal Nausea?: No Vomiting?: No Indigestion/heartburn?: No Diarrhea?: No Constipation?: No  Constitutional Fever: No Night sweats?: No Weight loss?: No Fatigue?: No  Skin Skin rash/lesions?: No Itching?: No  Eyes Blurred vision?: No Double vision?: No  Ears/Nose/Throat Sore throat?: No Sinus problems?: No  Hematologic/Lymphatic Swollen glands?: No Easy bruising?: No  Cardiovascular Leg swelling?: No Chest pain?: No  Respiratory Cough?: No Shortness of breath?: No  Endocrine Excessive thirst?: No  Musculoskeletal Back pain?: No Joint pain?: No  Neurological Headaches?: No Dizziness?: No  Psychologic Depression?: No Anxiety?: No  Physical Exam: BP 134/79 (BP Location: Left Arm, Patient Position: Sitting, Cuff Size: Normal)   Pulse 91   Ht 5\' 9"  (1.753 m)   Wt 171 lb 9.6 oz (77.8 kg)   BMI 25.34 kg/m   Constitutional:  Alert and  oriented, No acute distress. Respiratory: Normal respiratory effort, no increased work of breathing. Head: Normocephalic and Atraumatic. GU: No CVA tenderness Rectal: Normal sphincter tone. Prostate is approximately 30 grams, no nodules. Skin: No rashes, bruises or suspicious lesions. Neurologic: Grossly intact, no focal deficits, moving all 4 extremities. Psychiatric: Normal mood and affect.  Laboratory Data: Lab Results  Component Value Date   WBC  6.9 05/19/2018   HGB 14.6 05/19/2018   HCT 43.7 05/19/2018   MCV 102.2 (H) 05/19/2018   PLT 292.0 05/19/2018   Lab Results  Component Value Date   CREATININE 0.76 05/19/2018   Lab Results  Component Value Date   PSA 6.23 (H) 05/19/2018   PSA 2.14 12/15/2016   PSA 2.35 01/23/2016   Assessment & Plan:   1. Elevated PSA  -  I discussed with the patient that PSA is an acronym for  prostate specific antigen,  which is a protein made by the prostate gland and can be detected in the blood stream.    - At this time, I have advised the patient that we will repeat the PSA to rule out lab error.  If that should return elevated, we could continue observation or pursue a prostate biopsy. Discussed procedure and risk of infection.                I have reviewed the above documentation for accuracy and completeness, and I agree with the above.   [name], MD  Return for will call with PSA results.  [facility] [address] [place], [place] [address] [phone]  I, William Gibson , am acting as a scribe for [name], MD  I have reviewed the above documentation for accuracy and completeness, and I agree with the above.   [name], MD

## 2018-06-21 ENCOUNTER — Telehealth: Payer: Self-pay

## 2018-06-21 ENCOUNTER — Encounter: Payer: Self-pay | Admitting: Urology

## 2018-06-21 ENCOUNTER — Ambulatory Visit (INDEPENDENT_AMBULATORY_CARE_PROVIDER_SITE_OTHER): Payer: Medicare Other | Admitting: Urology

## 2018-06-21 VITALS — BP 134/79 | HR 91 | Ht 69.0 in | Wt 171.6 lb

## 2018-06-21 DIAGNOSIS — R972 Elevated prostate specific antigen [PSA]: Secondary | ICD-10-CM | POA: Diagnosis not present

## 2018-06-21 MED ORDER — OSELTAMIVIR PHOSPHATE 75 MG PO CAPS: 75.0000 mg | ORAL_CAPSULE | Freq: Every day | ORAL | 0 refills | Status: DC

## 2018-06-21 NOTE — Telephone Encounter (Signed)
TA-Wife [name] called as she was Dx with the flu at the ED/is requesting Prophylactic Tx with Tamiflu for [name]/Plz advise if ok to send in 75mg  1qd x10/thx dmf

## 2018-06-21 NOTE — Telephone Encounter (Signed)
Yes of course!  I will send it in now.  Thanks!

## 2018-06-22 ENCOUNTER — Telehealth: Payer: Self-pay | Admitting: *Deleted

## 2018-06-22 LAB — PSA: Prostate Specific Ag, Serum: 1.7 ng/mL (ref 0.0–4.0)

## 2018-06-22 NOTE — Telephone Encounter (Signed)
-----   Message from [name], MD sent at 06/22/2018  9:08 AM EST ----- I have amazing news, your PSA is down to 1.7 which is quite low.  I actually think it is appropriate for you to follow-up with your primary care and perhaps have your PSA repeated again next year.  No need to see me in follow-up.  No need for prostate biopsy or further imaging.  [name], MD

## 2018-06-22 NOTE — Telephone Encounter (Signed)
Pt aware/thx dmf 

## 2018-06-22 NOTE — Telephone Encounter (Signed)
Notified patient as instructed.   

## 2018-11-09 ENCOUNTER — Encounter: Payer: Self-pay | Admitting: Family Medicine

## 2018-11-09 ENCOUNTER — Emergency Department: Payer: 59

## 2018-11-09 ENCOUNTER — Emergency Department
Admission: EM | Admit: 2018-11-09 | Discharge: 2018-11-09 | Disposition: A | Discharge status: Home / Self Care | Payer: 59 | Attending: Emergency Medicine | Admitting: Emergency Medicine

## 2018-11-09 ENCOUNTER — Ambulatory Visit (INDEPENDENT_AMBULATORY_CARE_PROVIDER_SITE_OTHER): Payer: 59 | Admitting: Family Medicine

## 2018-11-09 ENCOUNTER — Encounter: Payer: Self-pay | Admitting: Emergency Medicine

## 2018-11-09 ENCOUNTER — Other Ambulatory Visit: Payer: Self-pay

## 2018-11-09 VITALS — BP 126/72 | HR 90 | Temp 98.0°F | Ht 69.0 in | Wt 162.0 lb

## 2018-11-09 DIAGNOSIS — Z720 Tobacco use: Secondary | ICD-10-CM | POA: Diagnosis not present

## 2018-11-09 DIAGNOSIS — R413 Other amnesia: Secondary | ICD-10-CM

## 2018-11-09 DIAGNOSIS — R531 Weakness: Secondary | ICD-10-CM | POA: Diagnosis not present

## 2018-11-09 DIAGNOSIS — R634 Abnormal weight loss: Secondary | ICD-10-CM

## 2018-11-09 DIAGNOSIS — R5383 Other fatigue: Secondary | ICD-10-CM | POA: Insufficient documentation

## 2018-11-09 DIAGNOSIS — R296 Repeated falls: Secondary | ICD-10-CM | POA: Insufficient documentation

## 2018-11-09 DIAGNOSIS — Z5321 Procedure and treatment not carried out due to patient leaving prior to being seen by health care provider: Secondary | ICD-10-CM | POA: Diagnosis not present

## 2018-11-09 DIAGNOSIS — R51 Headache: Secondary | ICD-10-CM | POA: Insufficient documentation

## 2018-11-09 DIAGNOSIS — F1010 Alcohol abuse, uncomplicated: Secondary | ICD-10-CM

## 2018-11-09 HISTORY — DX: Repeated falls: R29.6

## 2018-11-09 HISTORY — DX: Abnormal weight loss: R63.4

## 2018-11-09 LAB — URINALYSIS, COMPLETE (UACMP) WITH MICROSCOPIC
Bacteria, UA: NONE SEEN
Bilirubin Urine: NEGATIVE
Glucose, UA: NEGATIVE mg/dL
Hgb urine dipstick: NEGATIVE
Ketones, ur: NEGATIVE mg/dL
Leukocytes,Ua: NEGATIVE
Nitrite: NEGATIVE
Protein, ur: NEGATIVE mg/dL
Specific Gravity, Urine: 1.013 (ref 1.005–1.030)
pH: 6 (ref 5.0–8.0)

## 2018-11-09 LAB — CBC
HCT: 43.5 % (ref 39.0–52.0)
Hemoglobin: 14.7 g/dL (ref 13.0–17.0)
MCH: 34.8 pg — ABNORMAL HIGH (ref 26.0–34.0)
MCHC: 33.8 g/dL (ref 30.0–36.0)
MCV: 102.8 fL — ABNORMAL HIGH (ref 80.0–100.0)
Platelets: 190 10*3/uL (ref 150–400)
RBC: 4.23 MIL/uL (ref 4.22–5.81)
RDW: 14.9 % (ref 11.5–15.5)
WBC: 7.2 10*3/uL (ref 4.0–10.5)
nRBC: 0 % (ref 0.0–0.2)

## 2018-11-09 LAB — BASIC METABOLIC PANEL
Anion gap: 11 (ref 5–15)
BUN: 8 mg/dL (ref 8–23)
CO2: 23 mmol/L (ref 22–32)
Calcium: 8.7 mg/dL — ABNORMAL LOW (ref 8.9–10.3)
Chloride: 104 mmol/L (ref 98–111)
Creatinine, Ser: 0.7 mg/dL (ref 0.61–1.24)
GFR calc Af Amer: >60 mL/min (ref >60)
GFR calc non Af Amer: >60 mL/min (ref >60)
Glucose, Bld: 169 mg/dL — ABNORMAL HIGH (ref 70–99)
Potassium: 4.1 mmol/L (ref 3.5–5.1)
Sodium: 138 mmol/L (ref 135–145)

## 2018-11-09 NOTE — Addendum Note (Signed)
Addended by: Lucille Passy on: 11/09/2018 05:55 PM   Modules accepted: Orders

## 2018-11-09 NOTE — Progress Notes (Signed)
Update- spoke with patient's daughter.  He did go to [facility] but didn't stay after they did his head CT, UA,EKG, CBC and BMET.  Unfortunately I am still very concerned about him falling --she will make sure someone is watching him tonight. Nothing remarkable found on head CT or  labs and there are several others I'd like to get.  She agrees to bring him to office for labs tomorrow.  Orders entered.

## 2018-11-09 NOTE — ED Triage Notes (Signed)
Patient presents to the ED with 6-7 falls in the past week.  Patient's daughter states patient has had increased memory loss over the past week.  Patient states, "I must pass out because I never remember falling."  Patient's daughter states that patient's wife told her that when patient fell on Friday he hit his head.  Patient denies any pain at this time.  Patient is alert and oriented x 4 at this time.  Daughter reports patient has had increase in weakness/fatigue over the past week.

## 2018-11-09 NOTE — Assessment & Plan Note (Signed)
>  40 minutes spent in face to face time with patient, >50% spent in counselling or coordination of care with pt and his daughter talking about his unintentional weight loss, frequent falls, alcohol abuse.  I explained how concerned I am that he has fallen 7 times in 7 days, has a 10 pound unintentional weight loss, often with little to no memory of his falls.  He is a heavy drinker but claims this hasn't changed.  I suspect this is linked to alcohol abuse/intoxication.  I explained that I could order stat labs- ammonia, UA, CBC, folate, CMET and imaging but that would not change that he could go home an fall again tonight and this fall could be fatal.  His daughter and I finally were able to convince him to go to the ED.

## 2018-11-09 NOTE — Progress Notes (Signed)
Subjective:   Patient ID: William Gibson, male    DOB: 1945-11-27, 73 y.o.   MRN: [id number]  William Gibson is a pleasant 73 y.o. year old male who presents to clinic today with Fall (Pt and daughter screened at vehicle. Pt has fallen around 5 times in a week, having memory issues, unintentional weight loss, and wife says that he refuses to go to the hospital. I explained that he may need a scan and that cannot be done here and a Neuro appt can be sched quicker. Wife said that she thinks he drinks too much and is dehydrated and gets too hot while he works outside. Last night he fell on the deck and doesn't remember it. Friday he fell and doesn't remember. )  on 11/09/2018  HPI: According to patient's wife, he  has fallen around 5 times in a week, having memory issues, unintentional weight loss, and wife says that he refuses to go to the hospital Wife said that she thinks he drinks too much and is dehydrated and gets too hot while he works outside.  Last night he fell on the deck and doesn't remember it. Friday he fell and doesn't remember.  It has progressed in the last week to total 7 falls in one week.  He isn't sure if he feels dizzy before he falls and does not remember.  Denies any urinary complaints. No CP or SOB.    Admits to drinking a 5th of liquor a day but claims this isn't new.  Weight loss- unintentional- Wt Readings from Last 3 Encounters:  11/09/18 162 lb (73.5 kg)  11/09/18 162 lb (73.5 kg)  06/21/18 171 lb 9.6 oz (77.8 kg)   Denies nausea, vomiting, abdominal pain, diarrhea of blood in his stool.  Neg CT for lung cancer screening on 06/15/18.  Lab Results  Component Value Date   ALT 19 05/19/2018   AST 32 05/19/2018   ALKPHOS 88 05/19/2018   BILITOT 0.5 05/19/2018   Lab Results  Component Value Date   WBC 7.2 11/09/2018   HGB 14.7 11/09/2018   HCT 43.5 11/09/2018   MCV 102.8 (H) 11/09/2018   PLT 190 11/09/2018   No results found for: VITAMINB12     Current Outpatient Medications on File Prior to Visit  Medication Sig Dispense Refill  . meloxicam (MOBIC) 15 MG tablet Take 15 mg by mouth daily.  2  . predniSONE (DELTASONE) 5 MG tablet Take 5 mg by mouth daily as needed (arthritis).      No current facility-administered medications on file prior to visit.     No Known Allergies  Past Medical History:  Diagnosis Date  . Epistaxis   . GERD (gastroesophageal reflux disease)     Past Surgical History:  Procedure Laterality Date  . NASAL ENDOSCOPY WITH EPISTAXIS CONTROL N/A 04/12/2018   Procedure: NASAL ENDOSCOPY WITH EPISTAXIS CONTROL;  Surgeon: Melissa Montane, MD;  Location: [facility];  Service: ENT;  Laterality: N/A;  . VASECTOMY    . VASECTOMY REVERSAL      Family History  Problem Relation Age of Onset  . Colon cancer Neg Hx     Social History   Socioeconomic History  . Marital status: Married    Spouse name: Not on file  . Number of children: Not on file  . Years of education: Not on file  . Highest education level: Not on file  Occupational History  . Not on file  Social Needs  . Financial  resource strain: Not on file  . Food insecurity    Worry: Not on file    Inability: Not on file  . Transportation needs    Medical: Not on file    Non-medical: Not on file  Tobacco Use  . Smoking status: Current Every Day Smoker    Packs/day: 1.75    Years: 58.00    Pack years: 101.50    Types: Cigarettes  . Smokeless tobacco: Never Used  Substance and Sexual Activity  . Alcohol use: Yes    Alcohol/week: 28.0 standard drinks    Types: 28 Shots of liquor per week    Comment: moderate; 2 drinks last evening   . Drug use: No  . Sexual activity: Not on file  Lifestyle  . Physical activity    Days per week: Not on file    Minutes per session: Not on file  . Stress: Not on file  Relationships  . Social Herbalist on phone: Not on file    Gets together: Not on file    Attends religious service: Not on file     Active member of club or organization: Not on file    Attends meetings of clubs or organizations: Not on file    Relationship status: Not on file  . Intimate partner violence    Fear of current or ex partner: Not on file    Emotionally abused: Not on file    Physically abused: Not on file    Forced sexual activity: Not on file  Other Topics Concern  . Not on file  Social History Narrative   Does not have a living will.   Desires CPR, does not want prolonged life support if futile.            The PMH, PSH, Social History, Family History, Medications, and allergies have been reviewed in [facility], and have been updated if relevant.  Review of Systems  Constitutional: Positive for appetite change. Negative for fatigue.  HENT: Negative.   Respiratory: Negative.   Cardiovascular: Negative.   Gastrointestinal: Negative.   Genitourinary: Negative.   Musculoskeletal: Negative.   Hematological: Negative.   Psychiatric/Behavioral: Positive for confusion. Negative for agitation and suicidal ideas.  All other systems reviewed and are negative.      Objective:    BP 126/72 (BP Location: Left Arm, Patient Position: Sitting, Cuff Size: Normal)   Pulse 90   Temp 98 F (36.7 C) (Oral)   Ht 5\' 9"  (1.753 m)   Wt 162 lb (73.5 kg)   SpO2 95%   BMI 23.92 kg/m   Wt Readings from Last 3 Encounters:  11/09/18 162 lb (73.5 kg)  11/09/18 162 lb (73.5 kg)  06/21/18 171 lb 9.6 oz (77.8 kg)    Physical Exam Vitals signs and nursing note reviewed.  Constitutional:      Appearance: He is not toxic-appearing.     Comments: Does look gaunt and dry  HENT:     Head: Normocephalic.     Mouth/Throat:     Mouth: Mucous membranes are dry.  Eyes:     Extraocular Movements: Extraocular movements intact.  Cardiovascular:     Rate and Rhythm: Normal rate.     Pulses: Normal pulses.  Pulmonary:     Effort: Pulmonary effort is normal.  Abdominal:     General: Abdomen is flat.     Palpations: Abdomen  is soft.  Musculoskeletal: Normal range of motion.  General: No swelling.  Skin:    General: Skin is warm.     Coloration: Skin is not jaundiced.  Neurological:     General: No focal deficit present.     Mental Status: He is alert and oriented to person, place, and time.     Gait: Gait normal.  Psychiatric:        Mood and Affect: Mood normal.        Behavior: Behavior normal.

## 2018-11-10 ENCOUNTER — Other Ambulatory Visit (INDEPENDENT_AMBULATORY_CARE_PROVIDER_SITE_OTHER): Payer: 59

## 2018-11-10 DIAGNOSIS — F1010 Alcohol abuse, uncomplicated: Secondary | ICD-10-CM | POA: Diagnosis not present

## 2018-11-10 DIAGNOSIS — R634 Abnormal weight loss: Secondary | ICD-10-CM

## 2018-11-10 DIAGNOSIS — R296 Repeated falls: Secondary | ICD-10-CM

## 2018-11-10 LAB — URINALYSIS, ROUTINE W REFLEX MICROSCOPIC
Bilirubin Urine: NEGATIVE
Hgb urine dipstick: NEGATIVE
Ketones, ur: NEGATIVE
Leukocytes,Ua: NEGATIVE
Nitrite: NEGATIVE
Specific Gravity, Urine: 1.02 (ref 1.000–1.030)
Total Protein, Urine: NEGATIVE
Urine Glucose: NEGATIVE
Urobilinogen, UA: 0.2 (ref 0.0–1.0)
pH: 8 (ref 5.0–8.0)

## 2018-11-10 LAB — HEPATIC FUNCTION PANEL
ALT: 48 U/L (ref 0–53)
AST: 72 U/L — ABNORMAL HIGH (ref 0–37)
Albumin: 3.9 g/dL (ref 3.5–5.2)
Alkaline Phosphatase: 98 U/L (ref 39–117)
Bilirubin, Direct: 0.2 mg/dL (ref 0.0–0.3)
Total Bilirubin: 0.8 mg/dL (ref 0.2–1.2)
Total Protein: 8 g/dL (ref 6.0–8.3)

## 2018-11-10 LAB — B12 AND FOLATE PANEL
Folate: 3.3 ng/mL — ABNORMAL LOW (ref >5.9)
Vitamin B-12: 121 pg/mL — ABNORMAL LOW (ref 211–911)

## 2018-11-10 LAB — VITAMIN D 25 HYDROXY (VIT D DEFICIENCY, FRACTURES): VITD: 51.99 ng/mL (ref 30.00–100.00)

## 2018-11-10 LAB — AMMONIA: Ammonia: 50 umol/L — ABNORMAL HIGH (ref 11–35)

## 2018-11-10 LAB — HEMOGLOBIN A1C: Hgb A1c MFr Bld: 5.9 % (ref 4.6–6.5)

## 2018-11-10 NOTE — Addendum Note (Signed)
Addended by: Lynnea Ferrier on: 11/10/2018 10:51 AM   Modules accepted: Orders

## 2018-11-11 LAB — URINE CULTURE
MICRO NUMBER:: 674201
Result:: NO GROWTH
SPECIMEN QUALITY:: ADEQUATE

## 2018-11-14 ENCOUNTER — Ambulatory Visit: Payer: 59 | Admitting: Family Medicine

## 2018-11-15 DIAGNOSIS — E7220 Disorder of urea cycle metabolism, unspecified: Secondary | ICD-10-CM | POA: Insufficient documentation

## 2018-11-15 HISTORY — DX: Disorder of urea cycle metabolism, unspecified: E72.20

## 2018-11-15 LAB — VOLATILES,BLD-ACETONE,ETHANOL,ISOPROP,METHANOL
Acetone, blood: NEGATIVE % (ref 0.000–0.010)
Ethanol, blood: NEGATIVE % (ref 0.000–0.010)
Isopropanol, blood: NEGATIVE % (ref 0.000–0.010)
Methanol, blood: NEGATIVE % (ref 0.000–0.010)

## 2018-11-15 NOTE — Progress Notes (Signed)
Subjective:   Patient ID: William Gibson, male    DOB: 12/04/1945, 73 y.o.   MRN: [id number]  William Gibson is a pleasant 73 y.o. year old male who presents with his daughter  to clinic today with Follow-up  on 11/16/2018  HPI:  Very complicated situation- I saw him for frequent falls, confusion, weight loss on 11/09/18.  Note reviewed where I wrote the following HPI:  "According to patient's wife, he  has fallen around 5 times in a week, having memory issues, unintentional weight loss, and wife says that he refuses to go to the hospital Wife said that she thinks he drinks too much and is dehydrated and gets too hot while he works outside.  Last night he fell on the deck and doesn't remember it. Friday he fell and doesn't remember.  It has progressed in the last week to total 7 falls in one week.  He isn't sure if he feels dizzy before he falls and does not remember.  Denies any urinary complaints. No CP or SOB.    Admits to drinking a 5th of liquor a day but claims this isn't new.  Weight loss- unintentional- Wt Readings from Last 3 Encounters:  11/16/18 161 lb (73 kg)  11/09/18 162 lb (73.5 kg)  11/09/18 162 lb (73.5 kg)   Denies nausea, vomiting, abdominal pain, diarrhea of blood in his stool.  Neg CT for lung cancer screening on 06/15/18."  I explained how concerned I am that he has fallen 7 times in 7 days, has a 10 pound unintentional weight loss, often with little to no memory of his falls.  He is a heavy drinker but claims this hasn't changed.  I suspect this is linked to alcohol abuse/intoxication.  I explained that I could order stat labs- ammonia, UA, CBC, folate, CMET and imaging but that would not change that he could go home an fall again tonight and this fall could be fatal.  His daughter and I finally were able to convince him to go to the ED.  Unfortunately, I spoke to his daughter that evening and left the ED AMA after they did his head CT, UA,EKG, CBC and BMET.   Unfortunately I am still very concerned about him falling --she will make sure someone is watching him tonight. Nothing remarkable found on head CT or  labs and there are several others I'd like to get.  She agrees to bring him to office for labs the following day.  As expected, ammonia was high, AST was high, Vit B12 and folate were low- all indications for alcoholic liver disease.  I called daughter on 11/14/18 to discuss this and she said he is still drinking (which is okay because I didn't want him to stop cold [place]) but has not fallen.     Today she reports he has not fallen or been as confused since I last saw him.  He tells me today that he drinks a 5th of liquor through out the day- starting at 8 am (rum and coke) and just keeps refilling until his glass is empty.  He has been drinking this much liquor daily for 10 years.  Lab Results  Component Value Date   ALT 48 11/10/2018   AST 72 (H) 11/10/2018   ALKPHOS 98 11/10/2018   BILITOT 0.8 11/10/2018   Lab Results  Component Value Date   WBC 7.2 11/09/2018   HGB 14.7 11/09/2018   HCT 43.5 11/09/2018   MCV 102.8 (  H) 11/09/2018   PLT 190 11/09/2018   Lab Results  Component Value Date   VITAMINB12 121 (L) 11/10/2018      Current Outpatient Medications on File Prior to Visit  Medication Sig Dispense Refill  . meloxicam (MOBIC) 15 MG tablet Take 15 mg by mouth daily.  2  . predniSONE (DELTASONE) 5 MG tablet Take 5 mg by mouth daily as needed (arthritis).      No current facility-administered medications on file prior to visit.     No Known Allergies  Past Medical History:  Diagnosis Date  . Epistaxis   . GERD (gastroesophageal reflux disease)     Past Surgical History:  Procedure Laterality Date  . NASAL ENDOSCOPY WITH EPISTAXIS CONTROL N/A 04/12/2018   Procedure: NASAL ENDOSCOPY WITH EPISTAXIS CONTROL;  Surgeon: Melissa Montane, MD;  Location: [facility];  Service: ENT;  Laterality: N/A;  . VASECTOMY    . VASECTOMY  REVERSAL      Family History  Problem Relation Age of Onset  . Colon cancer Neg Hx     Social History   Socioeconomic History  . Marital status: Married    Spouse name: Not on file  . Number of children: Not on file  . Years of education: Not on file  . Highest education level: Not on file  Occupational History  . Not on file  Social Needs  . Financial resource strain: Not on file  . Food insecurity    Worry: Not on file    Inability: Not on file  . Transportation needs    Medical: Not on file    Non-medical: Not on file  Tobacco Use  . Smoking status: Current Every Day Smoker    Packs/day: 1.75    Years: 58.00    Pack years: 101.50    Types: Cigarettes  . Smokeless tobacco: Never Used  Substance and Sexual Activity  . Alcohol use: Yes    Alcohol/week: 28.0 standard drinks    Types: 28 Shots of liquor per week    Comment: moderate; 2 drinks last evening   . Drug use: No  . Sexual activity: Not on file  Lifestyle  . Physical activity    Days per week: Not on file    Minutes per session: Not on file  . Stress: Not on file  Relationships  . Social Herbalist on phone: Not on file    Gets together: Not on file    Attends religious service: Not on file    Active member of club or organization: Not on file    Attends meetings of clubs or organizations: Not on file    Relationship status: Not on file  . Intimate partner violence    Fear of current or ex partner: Not on file    Emotionally abused: Not on file    Physically abused: Not on file    Forced sexual activity: Not on file  Other Topics Concern  . Not on file  Social History Narrative   Does not have a living will.   Desires CPR, does not want prolonged life support if futile.            The PMH, PSH, Social History, Family History, Medications, and allergies have been reviewed in [facility], and have been updated if relevant.  Review of Systems  Constitutional: Positive for appetite change.  Negative for fatigue.  HENT: Negative.   Respiratory: Negative.   Cardiovascular: Negative.   Gastrointestinal: Negative.  Genitourinary: Negative.   Musculoskeletal: Negative.   Hematological: Negative.   Psychiatric/Behavioral: Positive for confusion. Negative for agitation and suicidal ideas.  All other systems reviewed and are negative.      Objective:    BP 128/74 (BP Location: Left Arm, Patient Position: Sitting, Cuff Size: Normal)   Pulse 86   Temp 98.2 F (36.8 C) (Oral)   Ht 5\' 9"  (1.753 m)   Wt 161 lb (73 kg)   SpO2 98%   BMI 23.78 kg/m   Wt Readings from Last 3 Encounters:  11/16/18 161 lb (73 kg)  11/09/18 162 lb (73.5 kg)  11/09/18 162 lb (73.5 kg)    Physical Exam Vitals signs and nursing note reviewed.  Constitutional:      Appearance: He is not toxic-appearing.     Comments: Does look gaunt and dry  HENT:     Head: Normocephalic.     Mouth/Throat:     Mouth: Mucous membranes are dry.  Eyes:     Extraocular Movements: Extraocular movements intact.  Cardiovascular:     Rate and Rhythm: Normal rate.     Pulses: Normal pulses.  Pulmonary:     Effort: Pulmonary effort is normal.  Abdominal:     General: Abdomen is flat.     Palpations: Abdomen is soft.  Musculoskeletal: Normal range of motion.        General: No swelling.  Skin:    General: Skin is warm.     Coloration: Skin is not jaundiced.     Comments: Some spider angiomas   Neurological:     General: No focal deficit present.     Mental Status: He is alert and oriented to person, place, and time.     Gait: Gait normal.     Comments: No asterixis   Psychiatric:        Mood and Affect: Mood normal.        Behavior: Behavior normal.

## 2018-11-16 ENCOUNTER — Encounter: Payer: Self-pay | Admitting: Family Medicine

## 2018-11-16 ENCOUNTER — Ambulatory Visit (INDEPENDENT_AMBULATORY_CARE_PROVIDER_SITE_OTHER): Payer: 59 | Admitting: Family Medicine

## 2018-11-16 VITALS — BP 128/74 | HR 86 | Temp 98.2°F | Ht 69.0 in | Wt 161.0 lb

## 2018-11-16 DIAGNOSIS — F1010 Alcohol abuse, uncomplicated: Secondary | ICD-10-CM | POA: Diagnosis not present

## 2018-11-16 DIAGNOSIS — E7220 Disorder of urea cycle metabolism, unspecified: Secondary | ICD-10-CM | POA: Diagnosis not present

## 2018-11-16 DIAGNOSIS — Z720 Tobacco use: Secondary | ICD-10-CM

## 2018-11-16 DIAGNOSIS — R296 Repeated falls: Secondary | ICD-10-CM

## 2018-11-16 DIAGNOSIS — R740 Nonspecific elevation of levels of transaminase and lactic acid dehydrogenase [LDH]: Secondary | ICD-10-CM | POA: Diagnosis not present

## 2018-11-16 DIAGNOSIS — R7401 Elevation of levels of liver transaminase levels: Secondary | ICD-10-CM

## 2018-11-16 LAB — PROTIME-INR
INR: 1 ratio (ref 0.8–1.0)
Prothrombin Time: 11.5 s (ref 9.6–13.1)

## 2018-11-16 MED ORDER — DIAZEPAM 5 MG PO TABS: ORAL_TABLET | ORAL | 0 refills | Status: DC

## 2018-11-16 MED ORDER — VITAMIN B-1 100 MG PO TABS: 100.0000 mg | ORAL_TABLET | Freq: Every day | ORAL | 3 refills | Status: DC

## 2018-11-16 MED ORDER — VITAMIN B12-FOLIC ACID 500-400 MCG PO TABS: ORAL_TABLET | ORAL | 3 refills | Status: DC

## 2018-11-16 NOTE — Patient Instructions (Addendum)
Alcohol Withdrawal Syndrome When a person who drinks a lot of alcohol stops drinking, he or she may have unpleasant and serious symptoms. These symptoms are called alcohol withdrawal syndrome. This condition may be mild or severe. It can be life-threatening. It can cause:  Shaking that you cannot control (tremor).  Sweating.  Headache.  Feeling fearful, upset, grouchy, or depressed.  Trouble sleeping (insomnia).  Nightmares.  Fast or uneven heartbeats (palpitations).  Alcohol cravings.  Feeling sick to your stomach (nausea).  Throwing up (vomiting).  Being bothered by light and sounds.  Confusion.  Trouble thinking clearly.  Not being hungry (loss of appetite).  Big changes in mood (mood swings). If you have all of the following symptoms at the same time, get help right away:  High blood pressure.  Fast heartbeat.  Trouble breathing.  Seizures.  Seeing, hearing, feeling, smelling, or tasting things that are not there (hallucinations). These symptoms are known as delirium tremens (DTs). They must be treated at the hospital right away. Follow these instructions at home:   Take over-the-counter and prescription medicines only as told by your doctor. This includes vitamins.  Do not drink alcohol.  Do not drive until your doctor says that this is safe for you.  Have someone stay with you or be available in case you need help. This should be someone you trust. This person can help you with your symptoms. He or she can also help you to not drink.  Drink enough fluid to keep your pee (urine) pale yellow.  Think about joining a support group or a treatment program to help you stop drinking.  Keep all follow-up visits as told by your doctor. This is important. Contact a doctor if:  Your symptoms get worse.  You cannot eat or drink without throwing up.  You have a hard time not drinking alcohol.  You cannot stop drinking alcohol. Get help right away if:   You have fast or uneven heartbeats.  You have chest pain.  You have trouble breathing.  You have a seizure for the first time.  You see, hear, feel, smell, or taste something that is not there.  You get very confused. Summary  When a person who drinks a lot of alcohol stops drinking, he or she may have serious symptoms. This is called alcohol withdrawal syndrome.  Delirium tremens (DTs) is a group of life-threatening symptoms. You should get help right away if you have these symptoms.  Think about joining an alcohol support group or a treatment program. This information is not intended to replace advice given to you by your health care provider. Make sure you discuss any questions you have with your health care provider. Document Released: 09/30/2007 Document Revised: 03/26/2017 Document Reviewed: 12/18/2016 Elsevier Patient Education  2020 Elsevier Inc.  

## 2018-11-16 NOTE — Assessment & Plan Note (Signed)
>  40 minutes spent in face to face time with patient and his daughter, >50% spent in counselling or coordination of care talking about alcohol abuse and the likelihood that he has alcoholic liver disease-  We had a  tough conversation about work up for alcoholic liver disease/cirhossis (CT abdomen), detoxing slowly and to repeat ammonia level, add additional labs. We agreed to cut back slowly- difficult to say how much as he is drinking throughout the day.  We decided on a schedule less ETOH (a shot) a few times a day- also sent in diazepam to help with withdrawal.  Discussed NOT stopping cold [place]- discussed and printed out symptoms of withdrawal.   eRx sent for valium to be used ONLY as directed along with B12, folate and thiamine.  They will update me every day with how he is doing. The patient and his daughter indicate understanding of these issues and agrees with the plan.  Orders Placed This Encounter  Procedures  . CT LIVER ABD W/WO  . Hepatitis C Antibody  . Comprehensive metabolic panel  . Gamma GT  . Acute Hep Panel & Hep B Surface Ab

## 2018-11-17 ENCOUNTER — Telehealth: Payer: Self-pay

## 2018-11-17 NOTE — Telephone Encounter (Signed)
Questions for Screening COVID-19  Symptom onset: None  Travel or Contacts: None  During this illness, did/does the patient experience any of the following symptoms? Fever >100.40F []   Yes [x]   No []   Unknown Subjective fever (felt feverish) []   Yes [x]   No []   Unknown Chills []   Yes [x]   No []   Unknown Muscle aches (myalgia) []   Yes [x]   No []   Unknown Runny nose (rhinorrhea) []   Yes [x]   No []   Unknown Sore throat []   Yes [x]   No []   Unknown Cough (new onset or worsening of chronic cough) []   Yes [x]   No []   Unknown Shortness of breath (dyspnea) []   Yes [x]   No []   Unknown Nausea or vomiting []   Yes [x]   No []   Unknown Headache []   Yes [x]   No []   Unknown Abdominal pain  []   Yes [x]   No []   Unknown Diarrhea (?3 loose/looser than normal stools/24hr period) []   Yes [x]   No []   Unknown Other, specify:  Patient risk factors: Smoker? []   Current []   Former []   Never If male, currently pregnant? []   Yes []   No  Patient Active Problem List   Diagnosis Date Noted  . Hyperammonemia ([place]) 11/15/2018  . Weight loss, unintentional 11/09/2018  . Frequent falls 11/09/2018  . RA (rheumatoid arthritis) ([place]) 08/17/2017  . Memory change 08/17/2017  . Alcohol abuse 12/15/2016  . Osteoarthritis 01/30/2016  . GERD (gastroesophageal reflux disease) 05/23/2014  . Elevated LDL cholesterol level 05/22/2013  . Erectile dysfunction 05/22/2013  . Tobacco abuse 04/26/2012    Plan:  []   High risk for COVID-19 with red flags go to ED (with CP, SOB, weak/lightheaded, or fever > 101.5). Call ahead.  []   High risk for COVID-19 but stable. Inform provider and coordinate time for [facility] visit.   []   No red flags but URI signs or symptoms okay for [facility] visit.

## 2018-11-17 NOTE — Addendum Note (Signed)
Addended by: Marrion Coy on: 11/17/2018 11:13 AM   Modules accepted: Orders

## 2018-11-18 ENCOUNTER — Other Ambulatory Visit (INDEPENDENT_AMBULATORY_CARE_PROVIDER_SITE_OTHER): Payer: 59

## 2018-11-18 DIAGNOSIS — R740 Nonspecific elevation of levels of transaminase and lactic acid dehydrogenase [LDH]: Secondary | ICD-10-CM | POA: Diagnosis not present

## 2018-11-18 DIAGNOSIS — E7220 Disorder of urea cycle metabolism, unspecified: Secondary | ICD-10-CM

## 2018-11-18 DIAGNOSIS — R7401 Elevation of levels of liver transaminase levels: Secondary | ICD-10-CM

## 2018-11-18 DIAGNOSIS — F1010 Alcohol abuse, uncomplicated: Secondary | ICD-10-CM

## 2018-11-18 LAB — COMPREHENSIVE METABOLIC PANEL
AG Ratio: 1 (calc) (ref 1.0–2.5)
ALT: 58 U/L — ABNORMAL HIGH (ref 9–46)
AST: 92 U/L — ABNORMAL HIGH (ref 10–35)
Albumin: 3.7 g/dL (ref 3.6–5.1)
Alkaline phosphatase (APISO): 82 U/L (ref 35–144)
BUN/Creatinine Ratio: 15 (calc) (ref 6–22)
BUN: 10 mg/dL (ref 7–25)
CO2: 23 mmol/L (ref 20–32)
Calcium: 9.1 mg/dL (ref 8.6–10.3)
Chloride: 105 mmol/L (ref 98–110)
Creat: 0.67 mg/dL — ABNORMAL LOW (ref 0.70–1.18)
Globulin: 3.8 g/dL (calc) — ABNORMAL HIGH (ref 1.9–3.7)
Glucose, Bld: 99 mg/dL (ref 65–99)
Potassium: 4.1 mmol/L (ref 3.5–5.3)
Sodium: 140 mmol/L (ref 135–146)
Total Bilirubin: 0.5 mg/dL (ref 0.2–1.2)
Total Protein: 7.5 g/dL (ref 6.1–8.1)

## 2018-11-18 LAB — HEPATITIS C ANTIBODY
Hepatitis C Ab: NONREACTIVE
SIGNAL TO CUT-OFF: 0.06 (ref <1.00)

## 2018-11-18 LAB — PROTIME-INR

## 2018-11-18 LAB — GAMMA GT: GGT: 359 U/L — ABNORMAL HIGH (ref 3–70)

## 2018-11-18 LAB — ACUTE HEP PANEL AND HEP B SURFACE AB
HEPATITIS C ANTIBODY REFILL$(REFL): NONREACTIVE
Hep A IgM: NONREACTIVE
Hep B C IgM: NONREACTIVE
Hepatitis B Surface Ag: NONREACTIVE
SIGNAL TO CUT-OFF: 0.06 (ref <1.00)

## 2018-11-18 LAB — REFLEX TIQ

## 2018-11-18 LAB — AMMONIA: Ammonia: 37 umol/L — ABNORMAL HIGH (ref 11–35)

## 2018-11-18 NOTE — Addendum Note (Signed)
Addended by: Lynnea Ferrier on: 11/18/2018 07:42 AM   Modules accepted: Orders

## 2018-11-18 NOTE — Addendum Note (Signed)
Addended by: Lynnea Ferrier on: 11/18/2018 10:22 AM   Modules accepted: Orders

## 2018-11-21 ENCOUNTER — Telehealth: Payer: Self-pay | Admitting: *Deleted

## 2018-11-21 NOTE — Telephone Encounter (Signed)
-----   Message from Jerene Bears, MD sent at 11/19/2018 12:19 PM EDT ----- This patient needs an office visit with the next available provider for elevated liver enzymes and probable alcoholic liver disease Assuming the patient is agreeable as primary care noted that he is in denial regarding his alcoholism ----- Message ----- From: Lucille Passy, MD Sent: 11/14/2018   1:38 PM EDT To: Jerene Bears, MD  Hannah Beat, I hate to bother you as you've never seen this patient but he clearly has alcoholic liver disease and is in severe denial.  I am waiting for one or two labs results but ammonia is elevated, ALT is much higher than AST, folate and B12 low.  He admits to drinking up to a fifth of liquor a day.  As a gastroenterologist, what would be your next step?  Would you start lactulose?  Would you go to hepatic imaging, check hepatitis panel, GGT, etc?  Thanks for all you always do and I hope you and your family are well, Tanja Port

## 2018-11-21 NOTE — Telephone Encounter (Signed)
Patient has been scheduled to see Dr Thornton Park on 12/01/18 at 10:10 am in office. He verbalizes understanding of this.

## 2018-11-22 ENCOUNTER — Ambulatory Visit: Payer: 59

## 2018-11-28 ENCOUNTER — Ambulatory Visit
Admission: RE | Admit: 2018-11-28 | Discharge: 2018-11-28 | Disposition: A | Discharge status: Home / Self Care | Payer: 59 | Source: Ambulatory Visit | Attending: Family Medicine | Admitting: Family Medicine

## 2018-11-28 ENCOUNTER — Other Ambulatory Visit: Payer: Self-pay

## 2018-11-28 DIAGNOSIS — I700 Atherosclerosis of aorta: Secondary | ICD-10-CM | POA: Insufficient documentation

## 2018-11-28 DIAGNOSIS — K760 Fatty (change of) liver, not elsewhere classified: Secondary | ICD-10-CM | POA: Insufficient documentation

## 2018-11-28 DIAGNOSIS — K861 Other chronic pancreatitis: Secondary | ICD-10-CM | POA: Insufficient documentation

## 2018-11-28 DIAGNOSIS — R296 Repeated falls: Secondary | ICD-10-CM | POA: Insufficient documentation

## 2018-11-28 DIAGNOSIS — F1010 Alcohol abuse, uncomplicated: Secondary | ICD-10-CM | POA: Diagnosis not present

## 2018-11-28 DIAGNOSIS — R740 Nonspecific elevation of levels of transaminase and lactic acid dehydrogenase [LDH]: Secondary | ICD-10-CM | POA: Diagnosis present

## 2018-11-28 DIAGNOSIS — R7401 Elevation of levels of liver transaminase levels: Secondary | ICD-10-CM

## 2018-11-28 DIAGNOSIS — I2510 Atherosclerotic heart disease of native coronary artery without angina pectoris: Secondary | ICD-10-CM | POA: Diagnosis not present

## 2018-11-28 MED ORDER — IOHEXOL 300 MG/ML  SOLN
100.0000 mL | Freq: Once | INTRAMUSCULAR | Status: AC | PRN
  Administered 2018-11-28: 08:00:00 100 mL via INTRAVENOUS

## 2018-11-30 NOTE — Progress Notes (Signed)
Referring Provider: Lucille Passy, MD Primary Care Physician:  Lucille Passy, MD  Reason for Consultation: Abnormal liver enzymes with suspected liver disease   IMPRESSION:  Abnormal liver enzymes Fatty liver on CT Recently found to have an elevated ammonia level Chronic pancreatitis by CT scan without associated symptoms  Ongoing alcohol use Heartburn controlled on Prilosec B12 and folate deficiencies Screening colonoscopy 2015 ([facility])    - follow-up recommended 2025    - 5 hyperplastic polyps removed  Review of recent labs shows a FIB-4 of 4.64 and APRI of 1.383  Suspected alcohol-related liver disease. May have alcohol induced portal hypertension +/- advanced liver disease. Normal synthetic function.  Must consider other concurrent causes of liver injury.   Falls may be an early sign of encephalopathy if he has portal hypertension. No obvious portal hypertension on recent CT scan. Platelets are 190,000.  No other evidence for decompensation  He must quit drinking. I clearly stated that if he continues to drink, he is drinking himself to death.  Formal substance abuse counseling recommended.  Detox recommended. Agree with plans recommended to the patient by Dr. Deborra Medina.  Al-Anon recommended for his daughters.   PLAN: Elastography to assess for liver stiffness/cirrhosis EGD to screen for varices in the setting of suspected portal hypertension Detox recommended with formal alcohol counseling Labs: fasting ferritin, fasting insulin, fasting glucose, iron, ANA, AMA, anti-smooth muscle antibody, IgG, IgM Follow-up in 3 months, or earlier as needed  Please see the "Patient Instructions" section for addition details about the plan.  HPI: THELBERT GARTIN is a 73 y.o. 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 He retired three years ago when he turned 35.  He is accompanied by his daughter. She lives 8 houses down and sees her father almost every day. His other daughter  participates by speaker telephone.  He has rheumatoid arthritis (RA), COPD, and alcoholism. Did not tolerate Humira. Treats RA with Mobic. The patient and his daughter think they are here to review his recent CT scan. He is not yet aware of the results.  He saw Dr. Hilarie Fredrickson for screening colonoscopy in 2015.  Frequent falls and loosing his balance. Daughter notes this is due to his elevated ammonia. He attributes the falls to being dehydrated.  Last week he had 7 falls in one week.   No abdominal pain, diarrhea, constipation, or nausea. Heartburn controlled on Prilosec.   Drinks 1/5 of [employer] daily. Started drinking at age 23. History of heavy drinking. Drinks at home. 2/4 CAGE questions. No guilt. No formal alcohol counseling. No prior DUI.  He understands that needs to quit drinking. But, he thinks that the alcohol is helping him stay healthy.   Blood donation regularly with last donation 10 years ago.  No prior blood transfusion.  No history of jaundice or scleral icterus.  No history of use or experimentation with IV or intranasal street drugs.  No history of autoimmune disease.  No family history of liver disease.  Labs from 11/09/2018 show white count 7.2, hemoglobin 14.7, MCV 102, RDW 14.9, platelets 190 Labs 11/16/2018 show a normal comprehensive metabolic panel except for an AST of 92, ALT 58.  Total bilirubin is 0.5.  Alkaline phosphatase 82.  GGT 359.  INR 1.0.  Hepatitis C antibody negative.  Hepatitis B surface antigen nonreactive.  Hepatitis B core antibody IgM nonreactive.  CT of the abdomen and pelvis with and without contrast 11/28/2018 showed: 1. Hepatic steatosis. 2. Chronic calcific pancreatitis. 3.  Aortic atherosclerosis (ICD10-170.0). Coronary artery calcification.  The patient was not aware of the results of the CT scan prior to today.   Prior screening colonoscopy 06/28/13 with Dr. Norman Herrlich showed 5 hyperplastic polyps and diverticulosis. Colonoscopy recommended in 2025.    No known family history of colon cancer or polyps. No family history of uterine/endometrial cancer, pancreatic cancer or gastric/stomach cancer.   Past Medical History:  Diagnosis Date   Epistaxis    GERD (gastroesophageal reflux disease)     Past Surgical History:  Procedure Laterality Date   NASAL ENDOSCOPY WITH EPISTAXIS CONTROL N/A 04/12/2018   Procedure: NASAL ENDOSCOPY WITH EPISTAXIS CONTROL;  Surgeon: Melissa Montane, MD;  Location: [facility];  Service: ENT;  Laterality: N/A;   VASECTOMY     VASECTOMY REVERSAL      Current Outpatient Medications  Medication Sig Dispense Refill   Cobalamin Combinations (VITAMIN B12-FOLIC ACID) 756-433 MCG TABS 1 tab by mouth daily. 90 tablet 3   diazepam (VALIUM) 5 MG tablet Day 1 take 1 - 2 tablets by mouth every 6 to 12 hours as needed, Days 2 to 5 - take 1 tab by mouth every 6 to 12 hours as needed 30 tablet 0   meloxicam (MOBIC) 15 MG tablet Take 15 mg by mouth daily.  2   predniSONE (DELTASONE) 5 MG tablet Take 5 mg by mouth daily as needed (arthritis).      thiamine (VITAMIN B-1) 100 MG tablet Take 1 tablet (100 mg total) by mouth daily. 90 tablet 3   No current facility-administered medications for this visit.     Allergies as of 12/01/2018   (No Known Allergies)    Family History  Problem Relation Age of Onset   Colon cancer Neg Hx     Social History   Socioeconomic History   Marital status: Married    Spouse name: Not on file   Number of children: Not on file   Years of education: Not on file   Highest education level: Not on file  Occupational History   Not on file  Social Needs   Financial resource strain: Not on file   Food insecurity    Worry: Not on file    Inability: Not on file   Transportation needs    Medical: Not on file    Non-medical: Not on file  Tobacco Use   Smoking status: Current Every Day Smoker    Packs/day: 1.75    Years: 58.00    Pack years: 101.50    Types: Cigarettes     Smokeless tobacco: Never Used  Substance and Sexual Activity   Alcohol use: Yes    Alcohol/week: 28.0 standard drinks    Types: 28 Shots of liquor per week    Comment: moderate; 2 drinks last evening    Drug use: No   Sexual activity: Not on file  Lifestyle   Physical activity    Days per week: Not on file    Minutes per session: Not on file   Stress: Not on file  Relationships   Social connections    Talks on phone: Not on file    Gets together: Not on file    Attends religious service: Not on file    Active member of club or organization: Not on file    Attends meetings of clubs or organizations: Not on file    Relationship status: Not on file   Intimate partner violence    Fear of current or ex partner:  Not on file    Emotionally abused: Not on file    Physically abused: Not on file    Forced sexual activity: Not on file  Other Topics Concern   Not on file  Social History Narrative   Does not have a living will.   Desires CPR, does not want prolonged life support if futile.             Review of Systems: 12 system ROS is negative except as noted above except for confusion.   Physical Exam:   Vital signs in last 24 hours: General:   Alert, in NAD. No scleral icterus. No bilateral temporal wasting.  Heart:  Regular rate and rhythm; no murmurs Pulm: Clear anteriorly; no wheezing Abdomen:  Soft. Nontender. Nondistended. Normal bowel sounds. No rebound or guarding. No fluid wave.  Neither the liver edge nor spleen tip is palpable.  LAD: No inguinal or umbilical LAD Extremities:  Without edema. Neurologic:  Alert and  oriented x4;  grossly normal neurologically; no asterixis or clonus. Skin: No jaundice. No palmar erythema or spider angioma.  Psych:  Alert and cooperative. Normal mood and affect.  Andrina Locken L. Tarri Glenn, MD, MPH 11/30/2018, 4:33 PM

## 2018-12-01 ENCOUNTER — Ambulatory Visit (INDEPENDENT_AMBULATORY_CARE_PROVIDER_SITE_OTHER): Payer: 59 | Admitting: Gastroenterology

## 2018-12-01 ENCOUNTER — Other Ambulatory Visit (INDEPENDENT_AMBULATORY_CARE_PROVIDER_SITE_OTHER): Payer: 59

## 2018-12-01 ENCOUNTER — Telehealth: Payer: Self-pay | Admitting: Gastroenterology

## 2018-12-01 ENCOUNTER — Encounter: Payer: Self-pay | Admitting: Gastroenterology

## 2018-12-01 VITALS — BP 150/70 | HR 86 | Temp 97.9°F | Ht 69.0 in | Wt 162.8 lb

## 2018-12-01 DIAGNOSIS — K760 Fatty (change of) liver, not elsewhere classified: Secondary | ICD-10-CM

## 2018-12-01 DIAGNOSIS — K859 Acute pancreatitis without necrosis or infection, unspecified: Secondary | ICD-10-CM | POA: Diagnosis not present

## 2018-12-01 DIAGNOSIS — R748 Abnormal levels of other serum enzymes: Secondary | ICD-10-CM

## 2018-12-01 LAB — IRON: Iron: 154 ug/dL (ref 42–165)

## 2018-12-01 LAB — FERRITIN: Ferritin: 198.9 ng/mL (ref 22.0–322.0)

## 2018-12-01 LAB — GLUCOSE, RANDOM: Glucose, Bld: 91 mg/dL (ref 70–99)

## 2018-12-01 NOTE — Patient Instructions (Addendum)
You must quit drinking if you want to live. Please consider detox and formal alcohol counseling to help you quit safely. Dr. Deborra Medina prescribed Valium. You should use this if you decide to cut back on your drinking to prevent your body from going into shock without the alcohol.   I have recommended a test to look for liver stiffness as a measure of liver damage and an upper endoscopy to look for damage associated with the liver. You have been scheduled at [facility] (1st floor of hospital) on 12-07-2018 at 9:00 am. Please arrive 15 minutes prior to your appointment for registration. Make certain not to have anything to eat or drink 6 hours prior to your appointment. Should you need to reschedule your appointment, please contact radiology at [phone]. This test typically takes about 30 minutes to perform.  I have recommended some additional labs to better understand how your liver is working.  Please go to the basement level for lab work before leaving today. Press "B" on the elevator. The lab is located at the first door on the left as you exit the elevator.  I encourage your daughters to try Al-Anon for support while you are making these important decisions.   Let's plan to check in again in a couple of months after these results are back to see how you are doing.

## 2018-12-01 NOTE — Telephone Encounter (Signed)

## 2018-12-02 ENCOUNTER — Other Ambulatory Visit: Payer: Self-pay

## 2018-12-02 ENCOUNTER — Ambulatory Visit (AMBULATORY_SURGERY_CENTER): Payer: 59 | Admitting: Gastroenterology

## 2018-12-02 ENCOUNTER — Encounter: Payer: Self-pay | Admitting: Gastroenterology

## 2018-12-02 VITALS — BP 143/82 | HR 69 | Temp 98.2°F | Resp 14 | Ht 69.0 in | Wt 162.0 lb

## 2018-12-02 DIAGNOSIS — K449 Diaphragmatic hernia without obstruction or gangrene: Secondary | ICD-10-CM | POA: Diagnosis not present

## 2018-12-02 DIAGNOSIS — K2970 Gastritis, unspecified, without bleeding: Secondary | ICD-10-CM

## 2018-12-02 DIAGNOSIS — K760 Fatty (change of) liver, not elsewhere classified: Secondary | ICD-10-CM | POA: Diagnosis not present

## 2018-12-02 DIAGNOSIS — R748 Abnormal levels of other serum enzymes: Secondary | ICD-10-CM

## 2018-12-02 DIAGNOSIS — K2950 Unspecified chronic gastritis without bleeding: Secondary | ICD-10-CM | POA: Diagnosis not present

## 2018-12-02 MED ORDER — SODIUM CHLORIDE 0.9 % IV SOLN: 500.0000 mL | Freq: Once | INTRAVENOUS | Status: DC

## 2018-12-02 NOTE — Progress Notes (Signed)
Covid screening and temp done by June

## 2018-12-02 NOTE — Progress Notes (Signed)
To PACU, VSS. Report to [email or web address] 

## 2018-12-02 NOTE — Patient Instructions (Signed)
YOU HAD AN ENDOSCOPIC PROCEDURE TODAY AT THE [facility]:   Refer to the procedure report that was given to you for any specific questions about what was found during the examination.  If the procedure report does not answer your questions, please call your gastroenterologist to clarify.  If you requested that your care partner not be given the details of your procedure findings, then the procedure report has been included in a sealed envelope for you to review at your convenience later.  YOU SHOULD EXPECT: Some feelings of bloating in the abdomen. Passage of more gas than usual.  Walking can help get rid of the air that was put into your GI tract during the procedure and reduce the bloating. If you had a lower endoscopy (such as a colonoscopy or flexible sigmoidoscopy) you may notice spotting of blood in your stool or on the toilet paper. If you underwent a bowel prep for your procedure, you may not have a normal bowel movement for a few days.  Please Note:  You might notice some irritation and congestion in your nose or some drainage.  This is from the oxygen used during your procedure.  There is no need for concern and it should clear up in a day or so.  SYMPTOMS TO REPORT IMMEDIATELY:   Following upper endoscopy (EGD)  Vomiting of blood or coffee ground material  New chest pain or pain under the shoulder blades  Painful or persistently difficult swallowing  New shortness of breath  Fever of 100F or higher  Black, tarry-looking stools  For urgent or emergent issues, a gastroenterologist can be reached at any hour by calling [phone].   DIET:  We do recommend a small meal at first, but then you may proceed to your regular diet.  Drink plenty of fluids but you should avoid alcoholic beverages for 24 hours.  ACTIVITY:  You should plan to take it easy for the rest of today and you should NOT DRIVE or use heavy machinery until tomorrow (because of the sedation medicines used  during the test).    FOLLOW UP: Our staff will call the number listed on your records 48-72 hours following your procedure to check on you and address any questions or concerns that you may have regarding the information given to you following your procedure. If we do not reach you, we will leave a message.  We will attempt to reach you two times.  During this call, we will ask if you have developed any symptoms of COVID 19. If you develop any symptoms (ie: fever, flu-like symptoms, shortness of breath, cough etc.) before then, please call [phone].  If you test positive for Covid 19 in the 2 weeks post procedure, please call and report this information to us.    If any biopsies were taken you will be contacted by phone or by letter within the next 1-3 weeks.  Please call us at [phone] if you have not heard about the biopsies in 3 weeks.    SIGNATURES/CONFIDENTIALITY: You and/or your care partner have signed paperwork which will be entered into your electronic medical record.  These signatures attest to the fact that that the information above on your After Visit Summary has been reviewed and is understood.  Full responsibility of the confidentiality of this discharge information lies with you and/or your care-partner. 

## 2018-12-02 NOTE — Progress Notes (Signed)
Called to room to assist during endoscopic procedure.  Patient ID and intended procedure confirmed with present staff. Received instructions for my participation in the procedure from the performing physician.  

## 2018-12-02 NOTE — Op Note (Signed)
[place] Patient Name: Dianna Deshler Procedure Date: 12/02/2018 9:21 AM MRN: [id number] Endoscopist: Thornton Park MD, MD Age: 73 Referring MD:  Date of Birth: 1945/06/03 Gender: Male Account #: [id number] Procedure:                Upper GI endoscopy Indications:              Portal hypertension rule out esophageal varices Medicines:                See the Anesthesia note for documentation of the                            administered medications Procedure:                Pre-Anesthesia Assessment:                           - Prior to the procedure, a History and Physical                            was performed, and patient medications and                            allergies were reviewed. The patient's tolerance of                            previous anesthesia was also reviewed. The risks                            and benefits of the procedure and the sedation                            options and risks were discussed with the patient.                            All questions were answered, and informed consent                            was obtained. Prior Anticoagulants: The patient has                            taken no previous anticoagulant or antiplatelet                            agents. ASA Grade Assessment: II - A patient with                            mild systemic disease. After reviewing the risks                            and benefits, the patient was deemed in                            satisfactory condition to undergo the procedure.  After obtaining informed consent, the endoscope was                            passed under direct vision. Throughout the                            procedure, the patient's blood pressure, pulse, and                            oxygen saturations were monitored continuously. The                            Endoscope was introduced through the mouth, and                            advanced to  the second part of duodenum. The upper                            GI endoscopy was accomplished without difficulty.                            The patient tolerated the procedure well. Scope In: Scope Out: Findings:                 The esophagus was normal. No esophageal varices                            present.                           Diffuse mildly erythematous mucosa without bleeding                            was found in the gastric body. Biopsies were taken                            with a cold forceps for histology. No gastric                            varices or portal hypertensive gastropathy seen. A                            small hiatal hernia was present.                           The examined duodenum was normal.                           The exam was otherwise without abnormality. Complications:            No immediate complications. Estimated blood loss:                            Minimal. Estimated Blood Loss:     Estimated blood loss was minimal. Impression:               -  Normal esophagus.                           - Erythematous mucosa in the gastric body. Biopsied.                           - Small hiatal hernia.                           - Normal examined duodenum.                           - There is no evidence for portal hypertension on                            this examination.                           - The examination was otherwise normal. Recommendation:           - Patient has a contact number available for                            emergencies. The signs and symptoms of potential                            delayed complications were discussed with the                            patient. Return to normal activities tomorrow.                            Written discharge instructions were provided to the                            patient.                           - Resume previous diet today.                           - Continue present  medications.                           - Await pathology results.                           - Follow-up appointment as previously planned. Thornton Park MD, MD 12/02/2018 9:47:07 AM This report has been signed electronically.

## 2018-12-03 LAB — ANTI-SMOOTH MUSCLE ANTIBODY, IGG: Actin (Smooth Muscle) Antibody (IGG): <20 U (ref <20)

## 2018-12-03 LAB — MITOCHONDRIAL ANTIBODIES: Mitochondrial M2 Ab, IgG: <=20 U

## 2018-12-03 LAB — IGG: IgG (Immunoglobin G), Serum: 1399 mg/dL (ref 600–1540)

## 2018-12-03 LAB — ANA: Anti Nuclear Antibody (ANA): NEGATIVE

## 2018-12-03 LAB — IGM: IgM, Serum: 261 mg/dL (ref 50–300)

## 2018-12-06 ENCOUNTER — Telehealth: Payer: Self-pay

## 2018-12-06 NOTE — Telephone Encounter (Signed)
  Follow up Call-  Call back number 12/02/2018  Post procedure Call Back phone  # [phone]  Permission to leave phone message Yes  Some recent data might be hidden     Patient questions:  Do you have a fever, pain , or abdominal swelling? No. Pain Score  0 *  Have you tolerated food without any problems? Yes.    Have you been able to return to your normal activities? Yes.    Do you have any questions about your discharge instructions: Diet   No. Medications  No. Follow up visit  No.  Do you have questions or concerns about your Care? No.  Actions: * If pain score is 4 or above: 1. No action needed, pain <4.Have you developed a fever since your procedure? no  2.   Have you had an respiratory symptoms (SOB or cough) since your procedure? no  3.   Have you tested positive for COVID 19 since your procedure no  4.   Have you had any family members/close contacts diagnosed with the COVID 19 since your procedure?  no   If yes to any of these questions please route to Joylene John, RN and Alphonsa Gin, Therapist, sports.

## 2018-12-07 ENCOUNTER — Encounter: Payer: Self-pay | Admitting: Gastroenterology

## 2018-12-07 ENCOUNTER — Ambulatory Visit (HOSPITAL_COMMUNITY)
Admission: RE | Admit: 2018-12-07 | Discharge: 2018-12-07 | Disposition: A | Discharge status: Home / Self Care | Payer: 59 | Source: Ambulatory Visit | Attending: Gastroenterology | Admitting: Gastroenterology

## 2018-12-07 ENCOUNTER — Other Ambulatory Visit: Payer: Self-pay

## 2018-12-07 DIAGNOSIS — K859 Acute pancreatitis without necrosis or infection, unspecified: Secondary | ICD-10-CM | POA: Diagnosis not present

## 2018-12-07 DIAGNOSIS — R748 Abnormal levels of other serum enzymes: Secondary | ICD-10-CM | POA: Diagnosis not present

## 2018-12-07 DIAGNOSIS — R945 Abnormal results of liver function studies: Secondary | ICD-10-CM | POA: Diagnosis not present

## 2018-12-07 DIAGNOSIS — K760 Fatty (change of) liver, not elsewhere classified: Secondary | ICD-10-CM | POA: Diagnosis not present

## 2018-12-07 LAB — INSULIN, FREE AND TOTAL
Free Insulin: 3.7 uU/mL
Total Insulin: 3.7 uU/mL

## 2018-12-09 ENCOUNTER — Other Ambulatory Visit: Payer: Self-pay | Admitting: Family Medicine

## 2018-12-09 ENCOUNTER — Telehealth (INDEPENDENT_AMBULATORY_CARE_PROVIDER_SITE_OTHER): Payer: 59 | Admitting: Family Medicine

## 2018-12-09 DIAGNOSIS — R634 Abnormal weight loss: Secondary | ICD-10-CM | POA: Diagnosis not present

## 2018-12-09 DIAGNOSIS — E7220 Disorder of urea cycle metabolism, unspecified: Secondary | ICD-10-CM

## 2018-12-09 DIAGNOSIS — R413 Other amnesia: Secondary | ICD-10-CM | POA: Diagnosis not present

## 2018-12-09 DIAGNOSIS — K717 Toxic liver disease with fibrosis and cirrhosis of liver: Secondary | ICD-10-CM

## 2018-12-09 DIAGNOSIS — K860 Alcohol-induced chronic pancreatitis: Secondary | ICD-10-CM

## 2018-12-09 DIAGNOSIS — F1020 Alcohol dependence, uncomplicated: Secondary | ICD-10-CM

## 2018-12-09 NOTE — Progress Notes (Signed)
[facility] VISIT   Patient agreed to [facility] visit and is aware that copayment and coinsurance may apply. Patient was treated using telemedicine according to accepted telemedicine protocols.  Subjective:   Patient daughter calls do discuss recent studies her dad had and diagnoses.  I saw patient on 11/16/18- note reviewed.  At that time, his wife and daugt I referred patient patient to GI due to alcohol abuse, elevated liver enzymes, and elevated ammonia ( likely contributing to altered mental status)   Patient Active Problem List   Diagnosis Date Noted  . Hyperammonemia ([place]) 11/15/2018  . Weight loss, unintentional 11/09/2018  . Frequent falls 11/09/2018  . RA (rheumatoid arthritis) ([place]) 08/17/2017  . Memory change 08/17/2017  . Alcohol abuse 12/15/2016  . Osteoarthritis 01/30/2016  . GERD (gastroesophageal reflux disease) 05/23/2014  . Elevated LDL cholesterol level 05/22/2013  . Erectile dysfunction 05/22/2013  . Tobacco abuse 04/26/2012   Social History   Tobacco Use  . Smoking status: Current Every Day Smoker    Packs/day: 1.50    Years: 58.00    Pack years: 87.00    Types: Cigarettes  . Smokeless tobacco: Never Used  Substance Use Topics  . Alcohol use: Yes    Alcohol/week: 28.0 standard drinks    Types: 28 Shots of liquor per week    Comment: more than 5 drinks daily    Current Outpatient Medications:  .  Cobalamin Combinations (VITAMIN B12-FOLIC ACID) 166-063 MCG TABS, 1 tab by mouth daily., Disp: 90 tablet, Rfl: 3 .  diazepam (VALIUM) 5 MG tablet, Day 1 take 1 - 2 tablets by mouth every 6 to 12 hours as needed, Days 2 to 5 - take 1 tab by mouth every 6 to 12 hours as needed, Disp: 30 tablet, Rfl: 0 .  meloxicam (MOBIC) 15 MG tablet, Take 15 mg by mouth daily., Disp: , Rfl: 2 .  predniSONE (DELTASONE) 5 MG tablet, Take 5 mg by mouth daily as needed (arthritis). , Disp: , Rfl:  .  thiamine (VITAMIN B-1) 100 MG tablet, Take 1 tablet (100 mg total) by mouth daily.,  Disp: 90 tablet, Rfl: 3  No Known Allergies  Assessment and Plan:   Diagnosis: . Please see myChart communication and orders below.     Arnette Norris, MD 12/09/2018   There is no separately reported E/M service related to this service in the past 7 days nor does the patient have an upcoming soonest available appointment for this issue. This work was completed in less than 7 days.   The patient consented to this service today (see patient agreement prior to ongoing communication). Patient counseled regarding the need for in-person exam for certain conditions and was advised to call the office if any changing or worsening symptoms occur.   The codes to be used for the E/M service are: []   99421 for 5-10 minutes of time spent on the inquiry. [x]   [id number] for 11-20 minutes. []   [id number] for 21+ minutes.

## 2018-12-12 ENCOUNTER — Ambulatory Visit: Payer: 59 | Admitting: Family Medicine

## 2018-12-13 ENCOUNTER — Telehealth: Payer: Self-pay

## 2018-12-13 NOTE — Telephone Encounter (Signed)

## 2018-12-14 ENCOUNTER — Encounter: Payer: Self-pay | Admitting: *Deleted

## 2018-12-14 ENCOUNTER — Other Ambulatory Visit (INDEPENDENT_AMBULATORY_CARE_PROVIDER_SITE_OTHER): Payer: 59

## 2018-12-14 DIAGNOSIS — K860 Alcohol-induced chronic pancreatitis: Secondary | ICD-10-CM | POA: Diagnosis not present

## 2018-12-14 DIAGNOSIS — F1020 Alcohol dependence, uncomplicated: Secondary | ICD-10-CM

## 2018-12-14 DIAGNOSIS — F1010 Alcohol abuse, uncomplicated: Secondary | ICD-10-CM | POA: Diagnosis not present

## 2018-12-14 DIAGNOSIS — E7220 Disorder of urea cycle metabolism, unspecified: Secondary | ICD-10-CM

## 2018-12-14 DIAGNOSIS — K717 Toxic liver disease with fibrosis and cirrhosis of liver: Secondary | ICD-10-CM

## 2018-12-14 LAB — COMPREHENSIVE METABOLIC PANEL
ALT: 37 U/L (ref 0–53)
AST: 39 U/L — ABNORMAL HIGH (ref 0–37)
Albumin: 3.6 g/dL (ref 3.5–5.2)
Alkaline Phosphatase: 79 U/L (ref 39–117)
BUN: 8 mg/dL (ref 6–23)
CO2: 26 mEq/L (ref 19–32)
Calcium: 9.1 mg/dL (ref 8.4–10.5)
Chloride: 104 mEq/L (ref 96–112)
Creatinine, Ser: 0.72 mg/dL (ref 0.40–1.50)
GFR: 106.83 mL/min (ref >60.00)
Glucose, Bld: 120 mg/dL — ABNORMAL HIGH (ref 70–99)
Potassium: 4.7 mEq/L (ref 3.5–5.1)
Sodium: 137 mEq/L (ref 135–145)
Total Bilirubin: 0.5 mg/dL (ref 0.2–1.2)
Total Protein: 7.4 g/dL (ref 6.0–8.3)

## 2018-12-14 LAB — LIPASE: Lipase: 52 U/L (ref 11.0–59.0)

## 2018-12-14 LAB — B12 AND FOLATE PANEL
Folate: 9.6 ng/mL (ref >5.9)
Vitamin B-12: 936 pg/mL — ABNORMAL HIGH (ref 211–911)

## 2018-12-14 LAB — AMMONIA: Ammonia: 44 umol/L — ABNORMAL HIGH (ref 11–35)

## 2018-12-14 NOTE — Addendum Note (Signed)
Addended by: Lynnea Ferrier on: 12/14/2018 10:13 AM   Modules accepted: Orders

## 2018-12-15 ENCOUNTER — Ambulatory Visit (INDEPENDENT_AMBULATORY_CARE_PROVIDER_SITE_OTHER): Payer: 59 | Admitting: Family Medicine

## 2018-12-15 ENCOUNTER — Encounter: Payer: Self-pay | Admitting: Family Medicine

## 2018-12-15 DIAGNOSIS — R945 Abnormal results of liver function studies: Secondary | ICD-10-CM

## 2018-12-15 DIAGNOSIS — K709 Alcoholic liver disease, unspecified: Secondary | ICD-10-CM

## 2018-12-15 DIAGNOSIS — F1020 Alcohol dependence, uncomplicated: Secondary | ICD-10-CM | POA: Diagnosis not present

## 2018-12-15 DIAGNOSIS — R7989 Other specified abnormal findings of blood chemistry: Secondary | ICD-10-CM

## 2018-12-15 DIAGNOSIS — I4510 Unspecified right bundle-branch block: Secondary | ICD-10-CM

## 2018-12-15 DIAGNOSIS — I998 Other disorder of circulatory system: Secondary | ICD-10-CM

## 2018-12-15 DIAGNOSIS — E7220 Disorder of urea cycle metabolism, unspecified: Secondary | ICD-10-CM | POA: Diagnosis not present

## 2018-12-15 DIAGNOSIS — R9082 White matter disease, unspecified: Secondary | ICD-10-CM

## 2018-12-15 DIAGNOSIS — R296 Repeated falls: Secondary | ICD-10-CM

## 2018-12-15 HISTORY — DX: Unspecified right bundle-branch block: I45.10

## 2018-12-15 HISTORY — DX: Other specified abnormal findings of blood chemistry: R79.89

## 2018-12-15 HISTORY — DX: Alcoholic liver disease, unspecified: K70.9

## 2018-12-15 HISTORY — DX: Other disorder of circulatory system: I99.8

## 2018-12-15 HISTORY — DX: Other disorder of circulatory system: R90.82

## 2018-12-15 HISTORY — DX: Abnormal results of liver function studies: R94.5

## 2018-12-15 NOTE — Assessment & Plan Note (Signed)
Improved

## 2018-12-15 NOTE — Assessment & Plan Note (Signed)
Discussed the importance of quitting drinking alcohol- he is down to 1 drink per day so we agreed he would set a quit date over the next several weeks.   He is refusing detox in a center.  We discussed how to use the valium as he quits drinking. Also congratulated him as his labs are improving.   And that his heavy drinking has lead to liver fibrosis which will become cirrhosis and will eventually lead to his death. The patient indicates understanding of these issues and agrees with the plan.

## 2018-12-15 NOTE — Assessment & Plan Note (Addendum)
>  60 minutes spent in face to face time with patient and his daughters, >50% spent in counselling or coordination of care about several concerns including RBBB.  No EKG to compare to but I agreed with his daughter that yes RBBB can cause some of his symptoms but we truly were triaging what was most critical.  I offered to refer to cardiology but pt would rather come see me on Monday to repeat EKG before we do that. Appointment scheduled with me for Monday.

## 2018-12-15 NOTE — Assessment & Plan Note (Signed)
Found on head CT in ED- classified as mild.  Discussed at length with family.  We absolutely can refer him to a neurologist for further work up and additional imaging but his heavy drinking and smoking is likely the culprit.  Pt would like to quit drinking before he considers anything further.  He is not ready to quit smoking. He is declining neurology referral.

## 2018-12-15 NOTE — Progress Notes (Signed)
Virtual Visit via Video   Due to the COVID-19 pandemic, this visit was completed with telemedicine (audio/video) technology to reduce patient and provider exposure as well as to preserve personal protective equipment.   I connected with William Gibson by a video enabled telemedicine application and verified that I am speaking with the correct person using two identifiers. Location patient: Home Location provider: [facility], Office Persons participating in the virtual visit: Zenovia Jarred, MD , patient's two daughters and William Gibson I discussed the limitations of evaluation and management by telemedicine and the availability of in person appointments. The patient expressed understanding and agreed to proceed.  Care Team   Patient Care Team: Lucille Passy, MD as PCP - General (Family Medicine) Pyrtle, Lajuan Lines, MD as Consulting Physician (Gastroenterology) Wendie Agreste, MD as Consulting Physician (Family Medicine) Darleen Crocker, MD as Consulting Physician (Ophthalmology)  Subjective:   HPI:   I last saw pt for virtual visit with William Gibson on 12/09/18 to follow up several visits with myself and GI about  frequent falls, alcoholism confusion and weight loss follow up (originally seen for these issues on 11/09/18).  Notes reviewed.  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  Last night he fell on the deck and doesn't remember it. Friday he fell and doesn't remember.  It has progressed in the last week to total 7 falls in one week.  He isn't sure if he feels dizzy before he falls and does not remember.  Denies any urinary complaints. No CP or SOB.    Admits to drinking a 5th of liquor a day but  claims this isn't new.  Weight loss- unintentional-  Wt Readings from Last 3 Encounters:  12/02/18 162 lb (73.5 kg)  12/01/18 162 lb 12.8 oz (73.8 kg)  11/16/18 161 lb (73 kg)    Denies nausea, vomiting, abdominal pain, diarrhea of blood in William stool.  Neg CT for lung cancer screening on 06/15/18."  I explained to pt on 11/09/18 how concerned I was that he has fallen 7 times in 7 days, had  a 10 pound unintentional weight loss, often with little to no memory of William falls. He is a heavy drinker.  I suspecedt this was all linked to alcohol abuse/intoxication. I explained that I could order stat labs- ammonia, UA, CBC, folate, CMET and imaging but that would not change that he could go home an fall again tonight and this fall could be fatal. William Gibson and I finally were able to convince him to go to the ED.  Unfortunately, I spoke to William Gibson that evening and left the ED AMA after they did William head CT, UA,EKG, CBC and BMET. Unfortunately I am still very concerned about him falling Nothing remarkable found on head CT or labs and there are several others I'd like to get.   She agreed to bring him to office for labs the following day.  As expected, ammonia was high, AST was high, Vit B12 and folate were low- all indications for alcoholic liver disease.  I called Gibson on 11/14/18 to discuss this and she said he is still drinking (which is okay because  I didn't want him to stop cold [place]) but has not fallen.     He told me today that he drinks a 5th of liquor through out the day- starting at 8 am (rum and coke) and just keeps refilling until William glass is empty.  He has been drinking this much liquor daily for 10 years.  On 7/22 I explained to pt and William Gibson that he he has alcoholic liver disease-  We had a  tough conversation about work up for alcoholic liver disease/cirhossis (CT abdomen), detoxing slowly and to repeat ammonia level, add additional labs. We agreed to  cut back slowly- difficult to say how much as he is drinking throughout the day.  We decided on a schedule less ETOH (a shot) a few times a day- also sent in diazepam to help with withdrawal.  Discussed NOT stopping cold [place]- discussed and printed out symptoms of withdrawal.   eRx sent for valium to be used ONLY as directed along with B12, folate and thiamine.  They will update me every day with how he is.  CT of abdomen done on 11/28/18 which showed- IMPRESSION: 1. Hepatic steatosis. 2. Chronic calcific pancreatitis. 3. Aortic atherosclerosis (ICD10-170.0). Coronary artery Calcification.  Referred him to GI to rule out esophageal varices, work up liver function, elevated ammonia etc.  He saw Dr. Tarri Glenn on 12/01/18.  Note reviewed.  Her assessment and plan was as follows:  IMPRESSION:  Abnormal liver enzymes Fatty liver on CT Recently found to have an elevated ammonia level Chronic pancreatitis by CT scan without associated symptoms  Ongoing alcohol use Heartburn controlled on Prilosec B12 and folate deficiencies Screening colonoscopy 2015 (Pyrtle)    - follow-up recommended 2025    - 5 hyperplastic polyps removed  Review of recent labs shows a FIB-4 of 4.64 and APRI of 1.383  Suspected alcohol-related liver disease. May have alcohol induced portal hypertension +/- advanced liver disease. Normal synthetic function.  Must consider other concurrent causes of liver injury.   Falls may be an early sign of encephalopathy if he has portal hypertension. No obvious portal hypertension on recent CT scan. Platelets are 190,000.  No other evidence for decompensation  He must quit drinking. I clearly stated that if he continues to drink, he is drinking himself to death.  Formal substance abuse counseling recommended.  Detox recommended. Agree with plans recommended to the patient by Dr. Deborra Medina.  Al-Anon recommended for William daughters.   PLAN: Elastography to assess for liver  stiffness/cirrhosis EGD to screen for varices in the setting of suspected portal hypertension Detox recommended with formal alcohol counseling Labs: fasting ferritin, fasting insulin, fasting glucose, iron, ANA, AMA, anti-smooth muscle antibody, IgG, IgM Follow-up in 3 months, or earlier as needed  EGD ruled out varices.   [place] elasto graphy of liver showed:  IMPRESSION: Liver: Negative  Elastography: Median hepatic shear wave velocity is calculated at 1.65 m/sec.  Corresponding Metavir fibrosis score is F2 + some F3.  Risk of fibrosis is Moderate.  Follow-up: Additional testing appropriate  Repeated labs yesterday-  Lipase was normal at 52. Liver function improved dramatically- Lab Results  Component Value Date   ALT 37 12/14/2018   AST 39 (H) 12/14/2018   ALKPHOS 79 12/14/2018   BILITOT 0.5 12/14/2018   B12 and folate also dramatically improved, both now normal.  Lab Results  Component Value Date   VITAMINB12 936 (H) 12/14/2018    B12 was 121 1 month ago Folate was 3.3 a month  ago.  Ammonia down to 44 (was 50 1 month ago).  Drinking 2 drinks per day now.  He is falling less.  One of William daughters has several things she wants to discuss today-  Chronic white matter changes seen on head CT on 11/09/18-  She feels this is why he is falling.  She feels William gait is more shuffled too.   IMPRESSION: Mild chronic ischemic white matter disease. No acute intracranial abnormality seen.   RBB- found on EKG in ED on 7/15. No EKG to compare it to.  Unfortunately, it appears he left AMA.  Gibson is concerned the RBBB may also be causing William falls.  Review of Systems  Constitutional: Negative.   HENT: Negative.   Eyes: Negative.   Respiratory: Negative.   Cardiovascular: Negative.   Gastrointestinal: Negative.   Genitourinary: Negative.   Musculoskeletal: Negative.   Skin: Negative.   Neurological: Positive for dizziness and weakness. Negative for tingling,  tremors, sensory change, speech change, focal weakness, seizures, loss of consciousness and headaches.  Endo/Heme/Allergies: Negative.   Psychiatric/Behavioral: Negative.   All other systems reviewed and are negative.    Patient Active Problem List   Diagnosis Date Noted   Abnormal liver function tests 97/67/3419   Alcoholic liver disease ([place]) 12/15/2018   RBBB 12/15/2018   Hyperammonemia ([place]) 11/15/2018   Weight loss, unintentional 11/09/2018   Frequent falls 11/09/2018   RA (rheumatoid arthritis) ([place]) 08/17/2017   Memory change 08/17/2017   Alcoholism ([place]) 12/15/2016   Osteoarthritis 01/30/2016   GERD (gastroesophageal reflux disease) 05/23/2014   Elevated LDL cholesterol level 05/22/2013   Erectile dysfunction 05/22/2013   Tobacco abuse 04/26/2012    Social History   Tobacco Use   Smoking status: Current Every Day Smoker    Packs/day: 1.50    Years: 58.00    Pack years: 87.00    Types: Cigarettes   Smokeless tobacco: Never Used  Substance Use Topics   Alcohol use: Yes    Alcohol/week: 28.0 standard drinks    Types: 28 Shots of liquor per week    Comment: more than 5 drinks daily    Current Outpatient Medications:    Cobalamin Combinations (VITAMIN B12-FOLIC ACID) 379-024 MCG TABS, 1 tab by mouth daily., Disp: 90 tablet, Rfl: 3   meloxicam (MOBIC) 15 MG tablet, Take 15 mg by mouth daily., Disp: , Rfl: 2   predniSONE (DELTASONE) 5 MG tablet, Take 5 mg by mouth daily as needed (arthritis). , Disp: , Rfl:    thiamine (VITAMIN B-1) 100 MG tablet, Take 1 tablet (100 mg total) by mouth daily., Disp: 90 tablet, Rfl: 3  No Known Allergies  Objective:  There were no vitals taken for this visit.  VITALS: Per patient if applicable, see vitals. GENERAL: Alert, appears well and in no acute distress. HEENT: Atraumatic, conjunctiva clear, no obvious abnormalities on inspection of external nose and ears. NECK: Normal movements of the head and  neck. CARDIOPULMONARY: No increased WOB. Speaking in clear sentences. I:E ratio WNL.  MS: Moves all visible extremities without noticeable abnormality. PSYCH: Pleasant and cooperative, well-groomed. Speech normal rate and rhythm. Affect is appropriate. Insight and judgement are appropriate. Attention is focused, linear, and appropriate.  NEURO: CN grossly intact. Oriented as arrived to appointment on time with no prompting. Moves both UE equally.  SKIN: No obvious lesions, wounds, erythema, or cyanosis noted on face or hands.  Depression screen [facility] 2/9 11/16/2018 06/01/2017 01/30/2016  Decreased Interest 0 0 0  Down, Depressed,  Hopeless 0 0 0  PHQ - 2 Score 0 0 0    Assessment and Plan:   Gwynn was seen today for follow-up.  Diagnoses and all orders for this visit:  Hyperammonemia ([place])  Alcoholism ([place])  Abnormal liver function tests  Alcoholic liver disease ([place])  RBBB     COVID-19 Education: The signs and symptoms of COVID-19 were discussed with the patient and how to seek care for testing if needed. The importance of social distancing was discussed today.  Reviewed expectations re: course of current medical issues.  Discussed self-management of symptoms.  Outlined signs and symptoms indicating need for more acute intervention.  Patient verbalized understanding and all questions were answered.  Health Maintenance issues including appropriate healthy diet, exercise, and smoking avoidance were discussed with patient.  See orders for this visit as documented in the electronic medical record.  Arnette Norris, MD  Records requested if needed. Time spent: 60  minutes, of which >50% was spent in obtaining information about William symptoms, reviewing William previous labs, evaluations, and treatments, counseling him about William condition (please see the discussed topics above), and developing a plan to further investigate it; he had a number of questions which I addressed.

## 2018-12-15 NOTE — Assessment & Plan Note (Signed)
Improved- he had one fall but pt says he tripped over his slippers.

## 2018-12-16 ENCOUNTER — Telehealth: Payer: Self-pay

## 2018-12-16 LAB — ETHANOL: Ethanol: 0.027 %

## 2018-12-16 NOTE — Telephone Encounter (Signed)
Questions for Screening COVID-19  Symptom onset: None  Travel or Contacts: None  During this illness, did/does the patient experience any of the following symptoms? Fever >100.78F []   Yes [x]   No []   Unknown Subjective fever (felt feverish) []   Yes [x]   No []   Unknown Chills []   Yes [x]   No []   Unknown Muscle aches (myalgia) []   Yes [x]   No []   Unknown Runny nose (rhinorrhea) []   Yes [x]   No []   Unknown Sore throat []   Yes []   No []   Unknown Cough (new onset or worsening of chronic cough) []   Yes [x]   No []   Unknown Shortness of breath (dyspnea) []   Yes [x]   No []   Unknown Nausea or vomiting []   Yes [x]   No []   Unknown Headache []   Yes [x]   No []   Unknown Abdominal pain  []   Yes [x]   No []   Unknown Diarrhea (?3 loose/looser than normal stools/24hr period) []   Yes [x]   No []   Unknown Other, specify:  Patient risk factors: Smoker? []   Current []   Former []   Never If male, currently pregnant? []   Yes []   No  Patient Active Problem List   Diagnosis Date Noted  . Abnormal liver function tests 12/15/2018  . Alcoholic liver disease ([place]) 12/15/2018  . RBBB 12/15/2018  . 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 12/15/2018  . Hyperammonemia ([place]) 11/15/2018  . Weight loss, unintentional 11/09/2018  . Frequent falls 11/09/2018  . RA (rheumatoid arthritis) ([place]) 08/17/2017  . Memory change 08/17/2017  . Alcoholism ([place]) 12/15/2016  . Osteoarthritis 01/30/2016  . GERD (gastroesophageal reflux disease) 05/23/2014  . Elevated LDL cholesterol level 05/22/2013  . Erectile dysfunction 05/22/2013  . Tobacco abuse 04/26/2012    Plan:  []   High risk for COVID-19 with red flags go to ED (with CP, SOB, weak/lightheaded, or fever > 101.5). Call ahead.  []   High risk for COVID-19 but stable. Inform provider and coordinate time for [facility] visit.   []   No red flags but URI signs or symptoms okay for [facility] visit.

## 2018-12-19 ENCOUNTER — Ambulatory Visit (INDEPENDENT_AMBULATORY_CARE_PROVIDER_SITE_OTHER): Payer: 59 | Admitting: Family Medicine

## 2018-12-19 ENCOUNTER — Encounter: Payer: Self-pay | Admitting: Family Medicine

## 2018-12-19 ENCOUNTER — Other Ambulatory Visit: Payer: Self-pay

## 2018-12-19 VITALS — BP 128/84 | HR 83 | Temp 98.0°F | Ht 69.0 in | Wt 163.6 lb

## 2018-12-19 DIAGNOSIS — I998 Other disorder of circulatory system: Secondary | ICD-10-CM

## 2018-12-19 DIAGNOSIS — R7989 Other specified abnormal findings of blood chemistry: Secondary | ICD-10-CM

## 2018-12-19 DIAGNOSIS — R945 Abnormal results of liver function studies: Secondary | ICD-10-CM

## 2018-12-19 DIAGNOSIS — R413 Other amnesia: Secondary | ICD-10-CM

## 2018-12-19 DIAGNOSIS — Z720 Tobacco use: Secondary | ICD-10-CM

## 2018-12-19 DIAGNOSIS — R9082 White matter disease, unspecified: Secondary | ICD-10-CM | POA: Diagnosis not present

## 2018-12-19 DIAGNOSIS — F1020 Alcohol dependence, uncomplicated: Secondary | ICD-10-CM

## 2018-12-19 DIAGNOSIS — I4510 Unspecified right bundle-branch block: Secondary | ICD-10-CM | POA: Diagnosis not present

## 2018-12-19 DIAGNOSIS — K709 Alcoholic liver disease, unspecified: Secondary | ICD-10-CM | POA: Diagnosis not present

## 2018-12-19 DIAGNOSIS — R2689 Other abnormalities of gait and mobility: Secondary | ICD-10-CM

## 2018-12-19 DIAGNOSIS — E7220 Disorder of urea cycle metabolism, unspecified: Secondary | ICD-10-CM

## 2018-12-19 HISTORY — DX: Other abnormalities of gait and mobility: R26.89

## 2018-12-19 NOTE — Patient Instructions (Signed)
Great to see you. We are referring you to a cardiologist and neurologist- they will call you with an appointment.  Drink two ensure shakes a day.

## 2018-12-19 NOTE — Assessment & Plan Note (Signed)
>  40 minutes spent in face to face time with patient, >50% spent in counselling or coordination of care.  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  Daughters do want a neurology referral, so I have placed one since it is reasonable.  We did talk about smoking cessation but he is not ready.

## 2018-12-19 NOTE — Assessment & Plan Note (Signed)
Congratulated him on his success.  He has valium for withdrawal symptoms.  Refusing AA.

## 2018-12-19 NOTE — Assessment & Plan Note (Signed)
He is aware this is a problem but not ready to start cutting back or to quit.

## 2018-12-19 NOTE — Progress Notes (Signed)
Subjective:   Patient ID: William Gibson, male    DOB: 10-31-1945, 73 y.o.   MRN: [id number]  William Gibson is a pleasant 73 y.o. year old male who presents to clinic today with Abnormal ECG  on 12/19/2018  HPI:  See virtual visit note from 12/15/18- Spent 60 minutes at that [place] discussing patients alcoholic liver disease along with several other findings on imagining and exams.  One thing daughter was concerned about was RBBB found on EKG in ED on 11/09/18- no previous EKG to compare.  I agreed with her that this could lead to his loss of consciousness but I and several other doctors, including his gastroenterologist felt we needed to triage the most vital and likely cause- his alcohol abuse and liver disease. I did offer to send him to cardiology for RBBB (could also have been caused by his long term heavy drinking), but pt wanted to come see me to repeat EKG first.  Daughter was also concerned about chronic white matter changes seen on head CT on 11/09/18-  She feels this is why he was falling.  She feels his gait is more shuffled too.   IMPRESSION: Mild chronic ischemic white matter disease. No acute intracranial abnormality seen.  I explained that his labs are improving but until he quits drinking and smoking so heavily, we will not know what is truly causing his symptoms.  Mild ischemic white matter changes (vascular dementia) certainly can come from heavy drinking and smoking.     ETOH abuse-  He is falling less.  Weaned down and has not had a drink in 3 days.  He has been eating more drinking water more.  Has not fallen.  Daughter remains concerned about his shuffled gait and memory issues.  Lipase was normal at 52. Liver function improved dramatically from labs done on 12/14/18- - Recent Labs       Lab Results  Component Value Date   ALT 37 12/14/2018   AST 39 (H) 12/14/2018   ALKPHOS 79 12/14/2018   BILITOT 0.5 12/14/2018     B12 and folate also dramatically  improved, both now normal.  Recent Labs       Lab Results  Component Value Date   VITAMINB12 936 (H) 12/14/2018      B12 was 121 1 month ago Folate was 3.3 a month ago.  Ammonia down to 44 (was 50 1 month ago).  Current Outpatient Medications on File Prior to Visit  Medication Sig Dispense Refill  . Cobalamin Combinations (VITAMIN B12-FOLIC ACID) XX123456 MCG TABS 1 tab by mouth daily. 90 tablet 3  . meloxicam (MOBIC) 15 MG tablet Take 15 mg by mouth daily.  2  . predniSONE (DELTASONE) 5 MG tablet Take 5 mg by mouth daily as needed (arthritis).     Marland Kitchen thiamine (VITAMIN B-1) 100 MG tablet Take 1 tablet (100 mg total) by mouth daily. 90 tablet 3   No current facility-administered medications on file prior to visit.     No Known Allergies  Past Medical History:  Diagnosis Date  . Alcoholism ([place])   . Epistaxis   . GERD (gastroesophageal reflux disease)   . Rheumatoid arthritis ([place]) 2010    Past Surgical History:  Procedure Laterality Date  . NASAL ENDOSCOPY WITH EPISTAXIS CONTROL N/A 04/12/2018   Procedure: NASAL ENDOSCOPY WITH EPISTAXIS CONTROL;  Surgeon: Melissa Montane, MD;  Location: [facility];  Service: ENT;  Laterality: N/A;  . VASECTOMY    . VASECTOMY  REVERSAL      Family History  Problem Relation Age of Onset  . Lung cancer Father   . Heart disease Mother   . Colon cancer Neg Hx   . Esophageal cancer Neg Hx   . Stomach cancer Neg Hx     Social History   Socioeconomic History  . Marital status: Married    Spouse name: Not on file  . Number of children: Not on file  . Years of education: Not on file  . Highest education level: Not on file  Occupational History  . Not on file  Social Needs  . Financial resource strain: Not on file  . Food insecurity    Worry: Not on file    Inability: Not on file  . Transportation needs    Medical: Not on file    Non-medical: Not on file  Tobacco Use  . Smoking status: Current Every Day Smoker    Packs/day: 1.50     Years: 58.00    Pack years: 87.00    Types: Cigarettes  . Smokeless tobacco: Never Used  Substance and Sexual Activity  . Alcohol use: Yes    Alcohol/week: 28.0 standard drinks    Types: 28 Shots of liquor per week    Comment: more than 5 drinks daily  . Drug use: No  . Sexual activity: Not on file  Lifestyle  . Physical activity    Days per week: Not on file    Minutes per session: Not on file  . Stress: Not on file  Relationships  . Social Herbalist on phone: Not on file    Gets together: Not on file    Attends religious service: Not on file    Active member of club or organization: Not on file    Attends meetings of clubs or organizations: Not on file    Relationship status: Not on file  . Intimate partner violence    Fear of current or ex partner: Not on file    Emotionally abused: Not on file    Physically abused: Not on file    Forced sexual activity: Not on file  Other Topics Concern  . Not on file  Social History Narrative   Does not have a living will.   Desires CPR, does not want prolonged life support if futile.            The PMH, PSH, Social History, Family History, Medications, and allergies have been reviewed in [facility], and have been updated if relevant.  Review of Systems  Constitutional: Negative.   HENT: Negative.   Respiratory: Negative.   Cardiovascular: Negative.   Gastrointestinal: Negative.   Endocrine: Negative.   Genitourinary: Negative.   Musculoskeletal: Positive for gait problem. Negative for arthralgias, back pain, joint swelling, myalgias and neck pain.  Skin: Negative.   Allergic/Immunologic: Negative.   Neurological: Negative for dizziness, seizures, syncope and speech difficulty.  Hematological: Negative.   Psychiatric/Behavioral: Positive for confusion. Negative for agitation, behavioral problems, decreased concentration, dysphoric mood, hallucinations, self-injury, sleep disturbance and suicidal ideas. The patient is  not nervous/anxious and is not hyperactive.   All other systems reviewed and are negative.      Objective:    BP 128/84   Pulse 83   Temp 98 F (36.7 C) (Oral)   Ht 5\' 9"  (1.753 m)   Wt 163 lb 9.6 oz (74.2 kg)   SpO2 97%   BMI 24.16 kg/m   Wt Readings from  Last 3 Encounters:  12/19/18 163 lb 9.6 oz (74.2 kg)  12/02/18 162 lb (73.5 kg)  12/01/18 162 lb 12.8 oz (73.8 kg)    Physical Exam Vitals signs and nursing note reviewed.  Constitutional:      General: He is not in acute distress.    Appearance: Normal appearance. He is not ill-appearing.     Comments: Face looks less red and fuller.  Neck:     Musculoskeletal: Normal range of motion.  Cardiovascular:     Rate and Rhythm: Normal rate and regular rhythm.     Pulses: Normal pulses.     Heart sounds: Normal heart sounds.  Pulmonary:     Effort: Pulmonary effort is normal.     Breath sounds: Wheezing present.  Neurological:     Mental Status: He is alert.     Gait: Gait abnormal.     Comments: oriented x 2, gait is shuffled, no tremor  Psychiatric:        Mood and Affect: Mood normal.        Thought Content: Thought content normal.           Assessment & Plan:   Alcoholic liver disease ([place])  RBBB - Plan: EKG 12-Lead  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  Abnormal liver function tests  Hyperammonemia (HCC)  Shuffling gait No follow-ups on file.

## 2018-12-19 NOTE — Assessment & Plan Note (Signed)
Confirmed on EKG and although I do not think this is the primary cause of his symptoms, it could certainly be a contributing factor.  Refer to cardiology as family requested.

## 2018-12-27 ENCOUNTER — Telehealth: Payer: Self-pay

## 2018-12-27 NOTE — Telephone Encounter (Signed)
Copied from [place] [phone]. Topic: General - Other >> Dec 27, 2018 12:27 PM Yvette Rack wrote: Reason for CRM: Pt daughter Blanchard Mane stated that they have yet to hear from anyone regarding the referral requests. [name] also stated that the pt condition and diagnosis needs to be listed on the FMLA paperwork that was completed for her mother.

## 2018-12-28 NOTE — Telephone Encounter (Signed)
I have discussed in detail about the FMLA/gave numbers for referral places that were placed/thx dmf

## 2019-02-03 ENCOUNTER — Telehealth: Payer: Self-pay | Admitting: Cardiovascular Disease

## 2019-02-03 NOTE — Telephone Encounter (Signed)
Spoke with patient's daughter, [name], and advised her that one family member can accompany the patient for the appointment on Monday. I reviewed the screening policies and advised that they should call to reschedule if any of them have symptoms of Covid-19 that develop between now and Monday. There is a DPR on file for all [facility] medical practices to be able to speak with wife and daughter, [name].

## 2019-02-03 NOTE — Telephone Encounter (Signed)
New message:  William Gibson, daughter of the patient called and wanted to know if she or her mother would be able to come with the patient to his appointment on Monday. He has not been formally diagnosed with a memory issue, but they are going to see a neurologist in the near future to discuss some white matter from a previous cat scan.  The daughter is just worried that the patient may not give an accurate medical history, or will forget instructions that the doctor will give afterwards. Please call with the office's decision.

## 2019-02-06 ENCOUNTER — Encounter: Payer: Self-pay | Admitting: Nurse Practitioner

## 2019-02-06 ENCOUNTER — Ambulatory Visit (INDEPENDENT_AMBULATORY_CARE_PROVIDER_SITE_OTHER): Payer: 59 | Admitting: Cardiovascular Disease

## 2019-02-06 ENCOUNTER — Encounter: Payer: Self-pay | Admitting: Cardiovascular Disease

## 2019-02-06 ENCOUNTER — Other Ambulatory Visit: Payer: Self-pay

## 2019-02-06 VITALS — BP 122/82 | HR 88 | Ht 69.0 in | Wt 164.4 lb

## 2019-02-06 DIAGNOSIS — J449 Chronic obstructive pulmonary disease, unspecified: Secondary | ICD-10-CM

## 2019-02-06 DIAGNOSIS — E785 Hyperlipidemia, unspecified: Secondary | ICD-10-CM | POA: Insufficient documentation

## 2019-02-06 DIAGNOSIS — E782 Mixed hyperlipidemia: Secondary | ICD-10-CM

## 2019-02-06 DIAGNOSIS — R0602 Shortness of breath: Secondary | ICD-10-CM

## 2019-02-06 DIAGNOSIS — I4510 Unspecified right bundle-branch block: Secondary | ICD-10-CM

## 2019-02-06 NOTE — Progress Notes (Signed)
Cardiology Office Note:    Date:  02/06/2019   ID:  William Gibson, DOB 12/25/1945, MRN [id number]  PCP:  William Passy, MD  Cardiologist:  William Gibson  Electrophysiologist:  None   Referring MD: William Passy, MD   Chief Complaint  Patient presents with  . Abnormal ECG    History of Present Illness:    William Gibson is a 73 y.o. male with a hx of hyperlipidemia, right bundle branch block.  We are asked to see him today for further evaluation of his right bundle branch block  by Dr. .William Gibson.   Seen with wife , William Gibson  He has no complaints.   No concerns.  [name] does not do any exercise Lots of yard work .     No CP , no dyspnea. No syncope or presyncope.  Has a hx of frequent falling ,  Has not fallen recenlty since he stopped drinking .   Smokes 1.5 ppd.     Eats an unrestricted diet .       Past Medical History:  Diagnosis Date  . Abnormal liver function tests 12/15/2018  . Alcoholic liver disease ([place]) 12/15/2018  . Alcoholism ([place])   . Elevated LDL cholesterol level 05/22/2013  . Epistaxis   . Erectile dysfunction 05/22/2013  . Frequent falls 11/09/2018  . GERD (gastroesophageal reflux disease)   . Hyperammonemia ([place]) 11/15/2018  . Memory change 08/17/2017  . RA (rheumatoid arthritis) ([place]) 08/17/2017  . RBBB 12/15/2018  . Rheumatoid arthritis ([place]) 2010  . Shuffling gait 12/19/2018  . Tobacco abuse 04/26/2012  . Weight loss, unintentional 11/09/2018  . 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 12/15/2018    Past Surgical History:  Procedure Laterality Date  . NASAL ENDOSCOPY WITH EPISTAXIS CONTROL N/A 04/12/2018   Procedure: NASAL ENDOSCOPY WITH EPISTAXIS CONTROL;  Surgeon: Melissa Montane, MD;  Location: [facility];  Service: ENT;  Laterality: N/A;  . VASECTOMY    . VASECTOMY REVERSAL      Current Medications: Current Meds  Medication Sig  . Cobalamin Combinations (VITAMIN B12-FOLIC ACID) XX123456 MCG TABS 1 tab by mouth daily.  . meloxicam (MOBIC) 15 MG tablet  Take 15 mg by mouth daily.  . Omeprazole (PRILOSEC PO) Take 1 tablet by mouth as needed.  . predniSONE (DELTASONE) 5 MG tablet Take 5 mg by mouth daily as needed (arthritis).   Marland Kitchen thiamine (VITAMIN B-1) 100 MG tablet Take 1 tablet (100 mg total) by mouth daily.     Allergies:   Patient has no known allergies.   Social History   Socioeconomic History  . Marital status: Married    Spouse name: Not on file  . Number of children: Not on file  . Years of education: Not on file  . Highest education level: Not on file  Occupational History  . Not on file  Social Needs  . Financial resource strain: Not on file  . Food insecurity    Worry: Not on file    Inability: Not on file  . Transportation needs    Medical: Not on file    Non-medical: Not on file  Tobacco Use  . Smoking status: Current Every Day Smoker    Packs/day: 1.50    Years: 58.00    Pack years: 87.00    Types: Cigarettes  . Smokeless tobacco: Never Used  Substance and Sexual Activity  . Alcohol use: Yes    Alcohol/week: 28.0 standard drinks    Types: 28 Shots  of liquor per week    Comment: more than 5 drinks daily  . Drug use: No  . Sexual activity: Not on file  Lifestyle  . Physical activity    Days per week: Not on file    Minutes per session: Not on file  . Stress: Not on file  Relationships  . Social Herbalist on phone: Not on file    Gets together: Not on file    Attends religious service: Not on file    Active member of club or organization: Not on file    Attends meetings of clubs or organizations: Not on file    Relationship status: Not on file  Other Topics Concern  . Not on file  Social History Narrative   Does not have a living will.   Desires CPR, does not want prolonged life support if futile.              Family History: The patient's family history includes Heart disease in his mother; Lung cancer in his father. There is no history of Colon cancer, Esophageal cancer, or  Stomach cancer.  ROS:   Please see the history of present illness.     All other systems reviewed and are negative.  EKGs/Labs/Other Studies Reviewed:    The following studies were reviewed today:   EKG:   December 19, 2018: Normal sinus rhythm at 76 beats minute.  Right bundle branch block.  Possible   pulmonary disease pattern. Recent Labs: 05/19/2018: TSH 1.64 11/09/2018: Hemoglobin 14.7; Platelets 190 12/14/2018: ALT 37; BUN 8; Creatinine, Ser 0.72; Potassium 4.7; Sodium 137  Recent Lipid Panel    Component Value Date/Time   CHOL 257 (H) 05/19/2018 0954   TRIG 109.0 05/19/2018 0954   HDL 65.10 05/19/2018 0954   CHOLHDL 4 05/19/2018 0954   VLDL 21.8 05/19/2018 0954   LDLCALC 170 (H) 05/19/2018 0954   LDLDIRECT 143.8 05/15/2013 1001    Physical Exam:    VS:  BP 122/82   Pulse 88   Ht 5\' 9"  (1.753 m)   Wt 164 lb 6.4 oz (74.6 kg)   SpO2 97%   BMI 24.28 kg/m     Wt Readings from Last 3 Encounters:  02/06/19 164 lb 6.4 oz (74.6 kg)  12/19/18 163 lb 9.6 oz (74.2 kg)  12/02/18 162 lb (73.5 kg)     GEN: Elderly gentleman.  He appears to be older than his stated age. HEENT: Normal NECK: No JVD; No carotid bruits LYMPHATICS: No lymphadenopathy CARDIAC: Regular rate S1-S2. RESPIRATORY:  Clear to auscultation without rales, wheezing or rhonchi  ABDOMEN: Soft, non-tender, non-distended MUSCULOSKELETAL:  No edema; No deformity  SKIN: Warm and dry NEUROLOGIC:  Alert and oriented x 3 PSYCHIATRIC:  Normal affect   ASSESSMENT:    1. RBBB   2. Chronic obstructive pulmonary disease, unspecified COPD type ([place])   3. Shortness of breath   4. Mixed hyperlipidemia    PLAN:    In order of problems listed above:  1. Right bundle branch block: The patient does not have any symptoms of heart disease.  He denies any chest pain, shortness of breath, syncope or presyncope.  This right bundle branch block may be just a benign finding but we cannot exclude the presence of coronary  artery disease or congestive heart failure.  I like to do a [employer] study for further evaluation of this right bundle branch block.  We will also do an echocardiogram for further  evaluation of his valvular function left ventricular function.  We will have him follow-up with an APP in 3 months.  Based on the findings we will see him back for follow-up if we find any abnormalities or we will see him on an as-needed basis.  2.  COPD: He clearly has some degree of COPD.  He has smoked 1/2 packs of cigarettes a day for the past 60+ years.  Have advised him to quit smoking which will greatly reduce his risk of developing further complications.  In fact I told him that his cigarette smoking and COPD were likely far more serious than this right bundle branch block. He has no intention on stopping smoking.     Medication Adjustments/Labs and Tests Ordered: Current medicines are reviewed at length with the patient today.  Concerns regarding medicines are outlined above.  Orders Placed This Encounter  Procedures  . MYOCARDIAL PERFUSION IMAGING  . ECHOCARDIOGRAM COMPLETE   No orders of the defined types were placed in this encounter.   Patient Instructions  Medication Instructions:  Your physician recommends that you continue on your current medications as directed. Please refer to the Current Medication list given to you today.  If you need a refill on your cardiac medications before your next appointment, please call your pharmacy.   Lab work: None Ordered    Testing/Procedures: Your physician has requested that you have an echocardiogram. Echocardiography is a painless test that uses sound waves to create images of your heart. It provides your doctor with information about the size and shape of your heart and how well your heart's chambers and valves are working. This procedure takes approximately one hour. There are no restrictions for this procedure.  Your physician has requested  that you have a lexiscan myoview. For further information please visit HugeFiesta.tn. Please follow instruction sheet, as given.    Follow-Up: At [facility], you and your health needs are our priority.  As part of our continuing mission to provide you with exceptional heart care, we have created designated Provider Care Teams.  These Care Teams include your primary Cardiologist (physician) and Advanced Practice Providers (APPs -  Physician Assistants and Nurse Practitioners) who all work together to provide you with the care you need, when you need it. You will need a follow up appointment in:  3 months.   You may see Dr. Acie Fredrickson or one of the following Advanced Practice Providers on your designated Care Team: Richardson Dopp, PA-C [place], [place] . Daune Perch, NP       Signed, Mertie Moores, MD  02/06/2019 5:58 PM    [place]

## 2019-02-06 NOTE — Patient Instructions (Addendum)
Medication Instructions:  Your physician recommends that you continue on your current medications as directed. Please refer to the Current Medication list given to you today.  If you need a refill on your cardiac medications before your next appointment, please call your pharmacy.   Lab work: None Ordered    Testing/Procedures: Your physician has requested that you have an echocardiogram. Echocardiography is a painless test that uses sound waves to create images of your heart. It provides your doctor with information about the size and shape of your heart and how well your heart's chambers and valves are working. This procedure takes approximately one hour. There are no restrictions for this procedure.  Your physician has requested that you have a lexiscan myoview. For further information please visit HugeFiesta.[email or web address]. Please follow instruction sheet, as given.    Follow-Up: At [facility], you and your health needs are our priority.  As part of our continuing mission to provide you with exceptional heart care, we have created designated Provider Care Teams.  These Care Teams include your primary Cardiologist (physician) and Advanced Practice Providers (APPs -  Physician Assistants and Nurse Practitioners) who all work together to provide you with the care you need, when you need it. You will need a follow up appointment in:  3 months.   You may see Dr. Acie Fredrickson or one of the following Advanced Practice Providers on your designated Care Team: Richardson Dopp, PA-C [place], [place] . Daune Perch, NP

## 2019-02-08 ENCOUNTER — Telehealth (HOSPITAL_COMMUNITY): Payer: Self-pay | Admitting: *Deleted

## 2019-02-08 ENCOUNTER — Other Ambulatory Visit: Payer: Self-pay

## 2019-02-08 ENCOUNTER — Ambulatory Visit: Payer: 59 | Admitting: Neurology

## 2019-02-08 ENCOUNTER — Encounter (HOSPITAL_COMMUNITY): Payer: Self-pay | Admitting: *Deleted

## 2019-02-08 ENCOUNTER — Encounter: Payer: Self-pay | Admitting: Neurology

## 2019-02-08 VITALS — BP 133/86 | HR 76 | Temp 97.7°F | Ht 69.0 in | Wt 165.0 lb

## 2019-02-08 DIAGNOSIS — R413 Other amnesia: Secondary | ICD-10-CM | POA: Diagnosis not present

## 2019-02-08 DIAGNOSIS — F1020 Alcohol dependence, uncomplicated: Secondary | ICD-10-CM | POA: Diagnosis not present

## 2019-02-08 NOTE — Telephone Encounter (Signed)
Patient's wife per DPR given detailed instructions per Myocardial Perfusion Study Information Sheet for the test on 02/15/2019 at 1015. Patient notified to arrive 15 minutes early and that it is imperative to arrive on time for appointment to keep from having the test rescheduled.  If you need to cancel or reschedule your appointment, please call the office within 24 hours of your appointment. . Patient verbalized understanding.Noma Quijas, Ranae [place] mychart lettersent

## 2019-02-08 NOTE — Progress Notes (Signed)
Reason for visit: Memory disturbance, gait disturbance  Referring physician: Dr. Augustine Radar is a 73 y.o. male  History of present illness:  Mr. Poitier is a 73 year old right-handed white male with a history of tobacco and alcohol abuse.  The patient was drinking heavily in the summer 2020, he claims that he was consuming 3 of the 1/2 gallon bottles of liquor a week.  The patient comes in today with his wife who indicates that around that time she noted that he was having problems with gait instability, shuffling his feet, he was falling about.  He was also having some troubles with memory.  The wife has had to help him keep up with medications, she puts medications into a pill dispenser and the patient takes the pills on his own.  The wife keeps up with appointments, she does the finances.  The patient is able to operate a motor vehicle without difficulty, he is not getting lost.  The patient quit drinking in the latter part of August 2020.  Prior to quitting, he was noted to have hyperammonemia with the last ammonia of 44.  The patient has undergone an evaluation of the liver with evidence of fatty liver changes, but no cirrhosis.  Albumin levels have been running around 3.6.  The patient is sleeping fairly well, he has a good energy level during the day.  He denies any numbness or weakness of the extremities or difficulty controlling the bowels or the bladder.  He has not fallen since he quit drinking.  His wife has noted a significant improvement in memory and normalization of his ability to walk off the alcohol.  The has undergone a CT scan of the brain shows very minimal white matter changes.  He is sent to this office for further evaluation.  Past Medical History:  Diagnosis Date  . Abnormal liver function tests 12/15/2018  . Alcoholic liver disease ([place]) 12/15/2018  . Alcoholism ([place])   . Elevated LDL cholesterol level 05/22/2013  . Epistaxis   . Erectile dysfunction 05/22/2013   . Frequent falls 11/09/2018  . GERD (gastroesophageal reflux disease)   . Hyperammonemia ([place]) 11/15/2018  . Memory change 08/17/2017  . RA (rheumatoid arthritis) ([place]) 08/17/2017  . RBBB 12/15/2018  . Rheumatoid arthritis ([place]) 2010  . Shuffling gait 12/19/2018  . Tobacco abuse 04/26/2012  . Weight loss, unintentional 11/09/2018  . 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 12/15/2018    Past Surgical History:  Procedure Laterality Date  . NASAL ENDOSCOPY WITH EPISTAXIS CONTROL N/A 04/12/2018   Procedure: NASAL ENDOSCOPY WITH EPISTAXIS CONTROL;  Surgeon: Melissa Montane, MD;  Location: [facility];  Service: ENT;  Laterality: N/A;  . VASECTOMY    . VASECTOMY REVERSAL      Family History  Problem Relation Age of Onset  . Lung cancer Father   . Heart disease Mother   . Colon cancer Neg Hx   . Esophageal cancer Neg Hx   . Stomach cancer Neg Hx     Social history:  reports that he has been smoking cigarettes. He has a 87.00 pack-year smoking history. He has never used smokeless tobacco. He reports current alcohol use of about 28.0 standard drinks of alcohol per week. He reports that he does not use drugs.  Medications:  Prior to Admission medications   Medication Sig Start Date End Date Taking? Authorizing Provider  Cobalamin Combinations (VITAMIN B12-FOLIC ACID) XX123456 MCG TABS 1 tab by mouth daily. 11/16/18  Lucille Passy, MD  meloxicam (MOBIC) 15 MG tablet Take 15 mg by mouth daily. 07/18/15   [provider]  Omeprazole (PRILOSEC PO) Take 1 tablet by mouth as needed.    [provider]  predniSONE (DELTASONE) 5 MG tablet Take 5 mg by mouth daily as needed (arthritis).     [provider]  thiamine (VITAMIN B-1) 100 MG tablet Take 1 tablet (100 mg total) by mouth daily. 11/16/18   Lucille Passy, MD     No Known Allergies  ROS:  Out of a complete 14 system review of symptoms, the patient complains only of the following symptoms, and all other reviewed systems  are negative.  Mild memory disturbance Walking difficulty  Blood pressure 133/86, pulse 76, temperature 97.7 F (36.5 C), temperature source Temporal, height 5\' 9"  (1.753 m), weight 165 lb (74.8 kg).  Physical Exam  General: The patient is alert and cooperative at the time of the examination.  Eyes: Pupils are equal, round, and reactive to light. Discs are flat bilaterally.  Neck: The neck is supple, no carotid bruits are noted.  Respiratory: The respiratory examination is clear.  Cardiovascular: The cardiovascular examination reveals a regular rate and rhythm, no obvious murmurs or rubs are noted.  Skin: Extremities are without significant edema.  Neurologic Exam  Mental status: The patient is alert and oriented x 3 at the time of the examination. The Mini-Mental status examination done today shows a total score 27/30.  Cranial nerves: Facial symmetry is present. There is good sensation of the face to pinprick and soft touch bilaterally. The strength of the facial muscles and the muscles to head turning and shoulder shrug are normal bilaterally. Speech is well enunciated, no aphasia or dysarthria is noted. Extraocular movements are full. Visual fields are full. The tongue is midline, and the patient has symmetric elevation of the soft palate. No obvious hearing deficits are noted.  Motor: The motor testing reveals 5 over 5 strength of all 4 extremities. Good symmetric motor tone is noted throughout.  Sensory: Sensory testing is intact to pinprick, soft touch, vibration sensation, and position sense on all 4 extremities. No evidence of extinction is noted.  Coordination: Cerebellar testing reveals good finger-nose-finger and heel-to-shin bilaterally.  No evidence of asterixis was seen.  Gait and station: Gait is normal. Tandem gait is minimally unsteady. Romberg is negative. No drift is seen.  Reflexes: Deep tendon reflexes are symmetric and normal bilaterally. Toes are downgoing  bilaterally.   CT brain 11/09/18:  IMPRESSION: Mild chronic ischemic white matter disease. No acute intracranial abnormality seen.  * CT scan images were reviewed online. I agree with the written report.    Assessment/Plan:  1.  History of alcoholism  2.  History of hyperammonemia  3.  Mild memory disturbance  4.  Mild gait disturbance  The patient has apparently improved dramatically with his memory and cognitive functioning and with his ability to ambulate coming off of alcohol.  We will follow the memory issues over time, we discussed the possibility of going on naltrexone to reduce the urge to consume alcohol, but the patient does not wish to consider this.  The patient will come back in about 6 months.  As long as he stays off of alcohol his memory and gait issues should stabilize.  CT of the brain showed only minimal white matter changes.  Jill Alexanders MD 02/08/2019 10:00 AM  [facility] [address] [place], [place] [address]  Phone [phone] Fax [phone]  

## 2019-02-09 ENCOUNTER — Other Ambulatory Visit: Payer: Self-pay | Admitting: Family Medicine

## 2019-02-15 ENCOUNTER — Other Ambulatory Visit: Payer: Self-pay

## 2019-02-15 ENCOUNTER — Ambulatory Visit (HOSPITAL_COMMUNITY): Payer: 59 | Attending: Cardiovascular Disease

## 2019-02-15 ENCOUNTER — Ambulatory Visit (HOSPITAL_BASED_OUTPATIENT_CLINIC_OR_DEPARTMENT_OTHER): Payer: 59

## 2019-02-15 DIAGNOSIS — J449 Chronic obstructive pulmonary disease, unspecified: Secondary | ICD-10-CM | POA: Insufficient documentation

## 2019-02-15 DIAGNOSIS — I4510 Unspecified right bundle-branch block: Secondary | ICD-10-CM | POA: Diagnosis not present

## 2019-02-15 DIAGNOSIS — R0602 Shortness of breath: Secondary | ICD-10-CM

## 2019-02-15 LAB — MYOCARDIAL PERFUSION IMAGING
LV dias vol: 76 mL (ref 62–150)
LV sys vol: 32 mL
Peak HR: 92 {beats}/min
Rest HR: 68 {beats}/min
SDS: 1
SRS: 0
SSS: 1
TID: 1.03

## 2019-02-15 LAB — ECHOCARDIOGRAM COMPLETE
Height: 69 in
Weight: 2624 oz

## 2019-02-15 MED ORDER — TECHNETIUM TC 99M TETROFOSMIN IV KIT
32.7000 | PACK | Freq: Once | INTRAVENOUS | Status: AC | PRN
  Administered 2019-02-15: 32.7 via INTRAVENOUS
  Filled 2019-02-15: qty 33

## 2019-02-15 MED ORDER — REGADENOSON 0.4 MG/5ML IV SOLN
0.4000 mg | Freq: Once | INTRAVENOUS | Status: AC
  Administered 2019-02-15: 0.4 mg via INTRAVENOUS

## 2019-02-15 MED ORDER — TECHNETIUM TC 99M TETROFOSMIN IV KIT
10.2000 | PACK | Freq: Once | INTRAVENOUS | Status: AC | PRN
  Administered 2019-02-15: 10.2 via INTRAVENOUS
  Filled 2019-02-15: qty 11

## 2019-04-17 ENCOUNTER — Other Ambulatory Visit: Payer: Self-pay

## 2019-04-18 ENCOUNTER — Ambulatory Visit (INDEPENDENT_AMBULATORY_CARE_PROVIDER_SITE_OTHER): Payer: 59

## 2019-04-18 DIAGNOSIS — Z23 Encounter for immunization: Secondary | ICD-10-CM

## 2019-04-18 NOTE — Patient Instructions (Signed)
Health Maintenance Due  Topic Date Due  . TETANUS/TDAP  05/04/1964    Depression screen [facility] 2/9 11/16/2018 06/01/2017 01/30/2016  Decreased Interest 0 0 0  Down, Depressed, Hopeless 0 0 0  PHQ - 2 Score 0 0 0

## 2019-05-04 ENCOUNTER — Ambulatory Visit: Payer: 59 | Attending: Internal Medicine

## 2019-05-04 DIAGNOSIS — Z20822 Contact with and (suspected) exposure to covid-19: Secondary | ICD-10-CM

## 2019-05-06 LAB — NOVEL CORONAVIRUS, NAA: SARS-CoV-2, NAA: NOT DETECTED

## 2019-05-15 ENCOUNTER — Ambulatory Visit: Payer: 59 | Admitting: Cardiovascular Disease

## 2019-05-18 ENCOUNTER — Ambulatory Visit: Payer: 59 | Attending: Internal Medicine

## 2019-05-18 DIAGNOSIS — Z20822 Contact with and (suspected) exposure to covid-19: Secondary | ICD-10-CM

## 2019-05-19 LAB — NOVEL CORONAVIRUS, NAA: SARS-CoV-2, NAA: NOT DETECTED

## 2019-07-28 ENCOUNTER — Emergency Department (HOSPITAL_COMMUNITY)
Admission: EM | Admit: 2019-07-28 | Discharge: 2019-07-28 | Disposition: A | Discharge status: Home / Self Care | Payer: 59 | Attending: Emergency Medicine | Admitting: Emergency Medicine

## 2019-07-28 ENCOUNTER — Other Ambulatory Visit: Payer: Self-pay

## 2019-07-28 DIAGNOSIS — R040 Epistaxis: Secondary | ICD-10-CM | POA: Insufficient documentation

## 2019-07-28 DIAGNOSIS — M069 Rheumatoid arthritis, unspecified: Secondary | ICD-10-CM | POA: Diagnosis not present

## 2019-07-28 DIAGNOSIS — I10 Essential (primary) hypertension: Secondary | ICD-10-CM | POA: Diagnosis not present

## 2019-07-28 DIAGNOSIS — I959 Hypotension, unspecified: Secondary | ICD-10-CM | POA: Diagnosis not present

## 2019-07-28 DIAGNOSIS — Z743 Need for continuous supervision: Secondary | ICD-10-CM | POA: Diagnosis not present

## 2019-07-28 DIAGNOSIS — Z79899 Other long term (current) drug therapy: Secondary | ICD-10-CM | POA: Insufficient documentation

## 2019-07-28 DIAGNOSIS — F17210 Nicotine dependence, cigarettes, uncomplicated: Secondary | ICD-10-CM | POA: Diagnosis not present

## 2019-07-28 MED ORDER — OXYMETAZOLINE HCL 0.05 % NA SOLN
2.0000 | Freq: Once | NASAL | Status: AC
  Administered 2019-07-28: 2 via NASAL
  Filled 2019-07-28: qty 30

## 2019-07-28 NOTE — Discharge Instructions (Signed)
Your history and exam today was consistent with recurrent nosebleed.  We were able to get it stopped with the pressure and Afrin spray today.  You did not need other intervention including packing or ENT evaluation however given your history of the recurrent nosebleeds needing ENT evaluation, please follow-up with Dr. Janace Hoard for further management.  If any symptoms change or worsen, and you cannot get it stopped at home, please return to the nearest emergency department.

## 2019-07-28 NOTE — ED Provider Notes (Signed)
[facility] EMERGENCY DEPARTMENT Provider Note   CSN: [id number] Arrival date & time: 07/28/19  1614     History Chief Complaint  Patient presents with  . Epistaxis    William Gibson is a 74 y.o. male.  The history is provided by medical records and the patient. No language interpreter was used.  Epistaxis Location:  Bilateral Severity:  Moderate Duration:  30 minutes Timing:  Constant Progression:  Unchanged Chronicity:  Recurrent Context: weather change   Relieved by:  Nothing Worsened by:  Nothing Ineffective treatments:  None tried Associated symptoms: no blood in oropharynx, no congestion, no cough, no dizziness, no fever, no headaches and no sinus pain   Risk factors: no recent nasal surgery (several years ago)        Past Medical History:  Diagnosis Date  . Abnormal liver function tests 12/15/2018  . Alcoholic liver disease ([place]) 12/15/2018  . Alcoholism ([place])   . Elevated LDL cholesterol level 05/22/2013  . Epistaxis   . Erectile dysfunction 05/22/2013  . Frequent falls 11/09/2018  . GERD (gastroesophageal reflux disease)   . Hyperammonemia ([place]) 11/15/2018  . Memory change 08/17/2017  . RA (rheumatoid arthritis) ([place]) 08/17/2017  . RBBB 12/15/2018  . Rheumatoid arthritis ([place]) 2010  . Shuffling gait 12/19/2018  . Tobacco abuse 04/26/2012  . Weight loss, unintentional 11/09/2018  . 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 12/15/2018    Patient Active Problem List   Diagnosis Date Noted  . Hyperlipidemia 02/06/2019  . Shuffling gait 12/19/2018  . Abnormal liver function tests 12/15/2018  . Alcoholic liver disease ([place]) 12/15/2018  . RBBB 12/15/2018  . 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 12/15/2018  . Hyperammonemia ([place]) 11/15/2018  . Weight loss, unintentional 11/09/2018  . Frequent falls 11/09/2018  . RA (rheumatoid arthritis) ([place]) 08/17/2017  . Memory change 08/17/2017  . Alcoholism ([place]) 12/15/2016  . Osteoarthritis  01/30/2016  . GERD (gastroesophageal reflux disease) 05/23/2014  . Elevated LDL cholesterol level 05/22/2013  . Erectile dysfunction 05/22/2013  . Tobacco abuse 04/26/2012    Past Surgical History:  Procedure Laterality Date  . NASAL ENDOSCOPY WITH EPISTAXIS CONTROL N/A 04/12/2018   Procedure: NASAL ENDOSCOPY WITH EPISTAXIS CONTROL;  Surgeon: Melissa Montane, MD;  Location: [facility];  Service: ENT;  Laterality: N/A;  . VASECTOMY    . VASECTOMY REVERSAL         Family History  Problem Relation Age of Onset  . Lung cancer Father   . Heart disease Mother   . Colon cancer Neg Hx   . Esophageal cancer Neg Hx   . Stomach cancer Neg Hx     Social History   Tobacco Use  . Smoking status: Current Every Day Smoker    Packs/day: 1.50    Years: 58.00    Pack years: 87.00    Types: Cigarettes  . Smokeless tobacco: Never Used  Substance Use Topics  . Alcohol use: Yes    Alcohol/week: 28.0 standard drinks    Types: 28 Shots of liquor per week    Comment: more than 5 drinks daily  . Drug use: No    Home Medications Prior to Admission medications   Medication Sig Start Date End Date Taking? Authorizing Provider  Cobalamin Combinations (VITAMIN B12-FOLIC ACID) XX123456 MCG TABS 1 tab by mouth daily. 11/16/18   Lucille Passy, MD  meloxicam (MOBIC) 15 MG tablet Take 15 mg by mouth daily. 07/18/15   [provider]  Omeprazole (PRILOSEC PO) Take 1 tablet by mouth as needed.    [provider]  predniSONE (DELTASONE) 5 MG tablet Take 5 mg by mouth daily as needed (arthritis).     [provider]  thiamine (VITAMIN B-1) 100 MG tablet Take 1 tablet (100 mg total) by mouth daily. 11/16/18   Lucille Passy, MD    Allergies    Patient has no known allergies.  Review of Systems   Review of Systems  Constitutional: Negative for chills, diaphoresis, fatigue and fever.  HENT: Positive for nosebleeds. Negative for congestion and sinus pain.   Eyes: Negative for visual  disturbance.  Respiratory: Negative for cough, chest tightness, shortness of breath and wheezing.   Cardiovascular: Negative for chest pain, palpitations and leg swelling.  Gastrointestinal: Negative for abdominal pain, constipation, diarrhea, nausea and vomiting.  Genitourinary: Negative for dysuria.  Musculoskeletal: Negative for back pain, neck pain and neck stiffness.  Skin: Negative for rash and wound.  Neurological: Negative for dizziness, syncope, light-headedness, numbness and headaches.  Psychiatric/Behavioral: Negative for agitation.  All other systems reviewed and are negative.   Physical Exam Updated Vital Signs BP (!) 143/102   Pulse 97   Ht 5\' 9"  (1.753 m)   Wt 76.2 kg   SpO2 96%   BMI 24.81 kg/m   Physical Exam Vitals and nursing note reviewed.  Constitutional:      General: He is not in acute distress.    Appearance: He is well-developed. He is not ill-appearing, toxic-appearing or diaphoretic.  HENT:     Head: Normocephalic and atraumatic.     Nose: No signs of injury or laceration.     Right Nostril: Epistaxis present. No occlusion.     Left Nostril: Epistaxis present. No occlusion.     Right Sinus: No frontal sinus tenderness.     Left Sinus: No frontal sinus tenderness.     Mouth/Throat:     Mouth: Mucous membranes are moist.     Pharynx: No oropharyngeal exudate or posterior oropharyngeal erythema.  Eyes:     Extraocular Movements: Extraocular movements intact.     Conjunctiva/sclera: Conjunctivae normal.     Pupils: Pupils are equal, round, and reactive to light.  Cardiovascular:     Rate and Rhythm: Regular rhythm. Tachycardia present.     Pulses: Normal pulses.     Heart sounds: Normal heart sounds. No murmur.  Pulmonary:     Effort: Pulmonary effort is normal. No respiratory distress.     Breath sounds: Normal breath sounds.  Abdominal:     Palpations: Abdomen is soft.     Tenderness: There is no abdominal tenderness. There is no right CVA  tenderness, left CVA tenderness, guarding or rebound.  Musculoskeletal:        General: No tenderness.     Cervical back: Neck supple. No tenderness.  Skin:    General: Skin is warm and dry.     Capillary Refill: Capillary refill takes less than 2 seconds.     Findings: No erythema.  Neurological:     General: No focal deficit present.     Mental Status: He is alert.  Psychiatric:        Mood and Affect: Mood normal.     ED Results / Procedures / Treatments   Labs (all labs ordered are listed, but only abnormal results are displayed) Labs Reviewed - No data to display  EKG None  Radiology No results found.  Procedures .Epistaxis Management  Date/Time: 07/28/2019  6:01 PM Performed by: Courtney Paris, MD Authorized by: Courtney Paris, MD   Consent:    Consent obtained:  Verbal   Consent given by:  Patient   Risks discussed:  Bleeding   Alternatives discussed:  No treatment Anesthesia (see MAR for exact dosages):    Anesthesia method:  None Procedure details:    Treatment site:  L posterior and R posterior   Repair method: afrin.   Treatment complexity:  Limited   Treatment episode: recurring   Post-procedure details:    Assessment:  Bleeding stopped   Patient tolerance of procedure:  Tolerated well, no immediate complications   (including critical care time)  Medications Ordered in ED Medications  oxymetazoline (AFRIN) 0.05 % nasal spray 2 spray (2 sprays Each Nare Given 07/28/19 1637)    ED Course  I have reviewed the triage vital signs and the nursing notes.  Pertinent labs & imaging results that were available during my care of the patient were reviewed by me and considered in my medical decision making (see chart for details).    MDM Rules/Calculators/A&P                      William Gibson is a 74 y.o. male with a past medical history significant for alcoholic liver disease, hyperlipidemia, rheumatoid arthritis, and prior epistaxis  requiring ENT evaluation and surgical management who presents with epistaxis.  Patient reports that yesterday, he had a 45-minute nosebleed that he was able to stop at home.  He reports today it started again approximately 30 minutes prior to calling EMS when his nosebleeds persisted.  He denies any fevers, chills, chest pain, shortness of breath, palpitations, lightheadedness, nausea, vomiting, urinary symptoms or GI symptoms.  He denies any complaints aside from the nosebleed and does not want other work-up.  On exam, patient has mild epistaxis coming from both nares and his posterior oropharynx.  There is no tenderness otherwise.  Lungs clear and chest nontender.  Some bleeding going in the back of his mouth.  Normal gait.  Patient otherwise resting comfortably with mild tachycardia and blood pressure in the [id number] systolic.  A shared decision made conversation was held and due to his total bleed time being 30 to 45 minutes yesterday and 30 minutes prior to arrival today, we do not feel he needs any other lab work or imaging evaluation at this time.  We instead had the patient clear out his clot, used Afrin, and held pressure.  Patient had resolution of his epistaxis with this and had no further bleeding on reassessment.  At this time, do not feel he needs any packing placement and we will have him follow-up with ENT given his persistent nosebleeds in the past.  Suspect the large temperature changes any major changes in the environment with known epistaxis previously caused his bleeding.  Patient is agreeable to follow-up with ENT and knows return precautions.  They have no other questions or concerns and patient was discharged in good condition.   Final Clinical Impression(s) / ED Diagnoses Final diagnoses:  Epistaxis    Rx / DC Orders ED Discharge Orders    None     Clinical Impression: 1. Epistaxis     Disposition: Discharge  Condition: Good  I have discussed the results, Dx and Tx plan  with the pt(& family if present). He/she/they expressed understanding and agree(s) with the plan. Discharge instructions discussed at great length. Strict return precautions discussed and pt &/or  family have verbalized understanding of the instructions. No further questions at time of discharge.    New Prescriptions   No medications on file    Follow Up: Melissa Montane, MD [address] [place] [address] [phone]     [place] [address] [id number] [place]       Tegeler, Gwenyth Allegra, MD 07/28/19 [phone]

## 2019-07-28 NOTE — ED Triage Notes (Signed)
74 y.o male, alert and oriented c/o nosebleed. Bleed yesterday for 10 min. Have been bleeding now since 30 minutes before calling EMS.  Patient nose is active bleeding with copious amount of bright red blood. Denies any trauma. Pt. States they operated on his nose about 1 year ago however don't know the exact reason.

## 2019-08-02 DIAGNOSIS — R040 Epistaxis: Secondary | ICD-10-CM | POA: Diagnosis not present

## 2019-08-07 DIAGNOSIS — R040 Epistaxis: Secondary | ICD-10-CM | POA: Diagnosis not present

## 2019-08-08 NOTE — Progress Notes (Deleted)
PATIENT: William Gibson DOB: 21-Jun-1945  REASON FOR VISIT: follow up HISTORY FROM: patient  HISTORY OF PRESENT ILLNESS: Today 08/08/19  William Gibson is a 74 year old male with history of memory disturbance and tobacco and alcohol abuse, and gait disturbance. He stopped drinking in August 2020.  He has had significant improvement in his memory and ability to ambulate off alcohol. HISTORY 02/08/2019 Dr. Jannifer Franklin: William Gibson is a 74 year old right-handed white male with a history of tobacco and alcohol abuse.  The patient was drinking heavily in the summer 2020, he claims that he was consuming 3 of the 1/2 gallon bottles of liquor a week.  The patient comes in today with his wife who indicates that around that time she noted that he was having problems with gait instability, shuffling his feet, he was falling about.  He was also having some troubles with memory.  The wife has had to help him keep up with medications, she puts medications into a pill dispenser and the patient takes the pills on his own.  The wife keeps up with appointments, she does the finances.  The patient is able to operate a motor vehicle without difficulty, he is not getting lost.  The patient quit drinking in the latter part of August 2020.  Prior to quitting, he was noted to have hyperammonemia with the last ammonia of 44.  The patient has undergone an evaluation of the liver with evidence of fatty liver changes, but no cirrhosis.  Albumin levels have been running around 3.6.  The patient is sleeping fairly well, he has a good energy level during the day.  He denies any numbness or weakness of the extremities or difficulty controlling the bowels or the bladder.  He has not fallen since he quit drinking.  His wife has noted a significant improvement in memory and normalization of his ability to walk off the alcohol.  The has undergone a CT scan of the brain shows very minimal white matter changes.  He is sent to this office for  further evaluation.  REVIEW OF SYSTEMS: Out of a complete 14 system review of symptoms, the patient complains only of the following symptoms, and all other reviewed systems are negative.  ALLERGIES: No Known Allergies  HOME MEDICATIONS: Outpatient Medications Prior to Visit  Medication Sig Dispense Refill  . cholecalciferol (VITAMIN D3) 25 MCG (1000 UNIT) tablet Take 1,000 Units by mouth every other day.    . Cobalamin Combinations (VITAMIN B12-FOLIC ACID) XX123456 MCG TABS 1 tab by mouth daily. 90 tablet 3  . meloxicam (MOBIC) 15 MG tablet Take 15 mg by mouth daily.  2  . omeprazole (PRILOSEC OTC) 20 MG tablet Take 20 mg by mouth daily.    Marland Kitchen thiamine (VITAMIN B-1) 100 MG tablet Take 1 tablet (100 mg total) by mouth daily. 90 tablet 3   No facility-administered medications prior to visit.    PAST MEDICAL HISTORY: Past Medical History:  Diagnosis Date  . Abnormal liver function tests 12/15/2018  . Alcoholic liver disease ([place]) 12/15/2018  . Alcoholism ([place])   . Elevated LDL cholesterol level 05/22/2013  . Epistaxis   . Erectile dysfunction 05/22/2013  . Frequent falls 11/09/2018  . GERD (gastroesophageal reflux disease)   . Hyperammonemia ([place]) 11/15/2018  . Memory change 08/17/2017  . RA (rheumatoid arthritis) ([place]) 08/17/2017  . RBBB 12/15/2018  . Rheumatoid arthritis ([place]) 2010  . Shuffling gait 12/19/2018  . Tobacco abuse 04/26/2012  . Weight loss, unintentional 11/09/2018  .  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 12/15/2018    PAST SURGICAL HISTORY: Past Surgical History:  Procedure Laterality Date  . NASAL ENDOSCOPY WITH EPISTAXIS CONTROL N/A 04/12/2018   Procedure: NASAL ENDOSCOPY WITH EPISTAXIS CONTROL;  Surgeon: Melissa Montane, MD;  Location: [facility];  Service: ENT;  Laterality: N/A;  . VASECTOMY    . VASECTOMY REVERSAL      FAMILY HISTORY: Family History  Problem Relation Age of Onset  . Lung cancer Father   . Heart disease Mother   . Colon cancer Neg Hx   .  Esophageal cancer Neg Hx   . Stomach cancer Neg Hx     SOCIAL HISTORY: Social History   Socioeconomic History  . Marital status: Married    Spouse name: Not on file  . Number of children: Not on file  . Years of education: Not on file  . Highest education level: Not on file  Occupational History  . Not on file  Tobacco Use  . Smoking status: Current Every Day Smoker    Packs/day: 1.50    Years: 58.00    Pack years: 87.00    Types: Cigarettes  . Smokeless tobacco: Never Used  Substance and Sexual Activity  . Alcohol use: Yes    Alcohol/week: 28.0 standard drinks    Types: 28 Shots of liquor per week    Comment: more than 5 drinks daily  . Drug use: No  . Sexual activity: Not on file  Other Topics Concern  . Not on file  Social History Narrative   Does not have a living will.   Desires CPR, does not want prolonged life support if futile.            Social Determinants of Health   Financial Resource Strain:   . Difficulty of Paying Living Expenses:   Food Insecurity:   . Worried About Charity fundraiser in the Last Year:   . Arboriculturist in the Last Year:   Transportation Needs:   . Film/video editor (Medical):   Marland Kitchen Lack of Transportation (Non-Medical):   Physical Activity:   . Days of Exercise per Week:   . Minutes of Exercise per Session:   Stress:   . Feeling of Stress :   Social Connections:   . Frequency of Communication with Friends and Family:   . Frequency of Social Gatherings with Friends and Family:   . Attends Religious Services:   . Active Member of Clubs or Organizations:   . Attends Archivist Meetings:   Marland Kitchen Marital Status:   Intimate Partner Violence:   . Fear of Current or Ex-Partner:   . Emotionally Abused:   Marland Kitchen Physically Abused:   . Sexually Abused:       PHYSICAL EXAM  There were no vitals filed for this visit. There is no height or weight on file to calculate BMI.  Generalized: Well developed, in no acute  distress   Neurological examination  Mentation: Alert oriented to time, place, history taking. Follows all commands speech and language fluent Cranial nerve II-XII: Pupils were equal round reactive to light. Extraocular movements were full, visual field were full on confrontational test. Facial sensation and strength were normal. Uvula tongue midline. Head turning and shoulder shrug  were normal and symmetric. Motor: The motor testing reveals 5 over 5 strength of all 4 extremities. Good symmetric motor tone is noted throughout.  Sensory: Sensory testing is intact to soft touch on all  4 extremities. No evidence of extinction is noted.  Coordination: Cerebellar testing reveals good finger-nose-finger and heel-to-shin bilaterally.  Gait and station: Gait is normal. Tandem gait is normal. Romberg is negative. No drift is seen.  Reflexes: Deep tendon reflexes are symmetric and normal bilaterally.   DIAGNOSTIC DATA (LABS, IMAGING, TESTING) - I reviewed patient records, labs, notes, testing and imaging myself where available.  Lab Results  Component Value Date   WBC 7.2 11/09/2018   HGB 14.7 11/09/2018   HCT 43.5 11/09/2018   MCV 102.8 (H) 11/09/2018   PLT 190 11/09/2018      Component Value Date/Time   NA 137 12/14/2018 1039   NA 139 04/17/2015 1022   K 4.7 12/14/2018 1039   CL 104 12/14/2018 1039   CO2 26 12/14/2018 1039   GLUCOSE 120 (H) 12/14/2018 1039   BUN 8 12/14/2018 1039   BUN 13 04/17/2015 1022   CREATININE 0.72 12/14/2018 1039   CREATININE 0.67 (L) 11/16/2018 1346   CALCIUM 9.1 12/14/2018 1039   PROT 7.4 12/14/2018 1039   PROT 7.0 04/17/2015 1022   ALBUMIN 3.6 12/14/2018 1039   ALBUMIN 3.7 04/17/2015 1022   AST 39 (H) 12/14/2018 1039   ALT 37 12/14/2018 1039   ALKPHOS 79 12/14/2018 1039   BILITOT 0.5 12/14/2018 1039   BILITOT 0.6 04/17/2015 1022   GFRNONAA >60 11/09/2018 1410   GFRAA >60 11/09/2018 1410   Lab Results  Component Value Date   CHOL 257 (H) 05/19/2018    HDL 65.10 05/19/2018   LDLCALC 170 (H) 05/19/2018   LDLDIRECT 143.8 05/15/2013   TRIG 109.0 05/19/2018   CHOLHDL 4 05/19/2018   Lab Results  Component Value Date   HGBA1C 5.9 11/10/2018   Lab Results  Component Value Date   VITAMINB12 936 (H) 12/14/2018   Lab Results  Component Value Date   TSH 1.64 05/19/2018      ASSESSMENT AND PLAN 74 y.o. year old male  has a past medical history of Abnormal liver function tests ([id number]), Alcoholic liver disease ([place]) (12/15/2018), Alcoholism ([place]), Elevated LDL cholesterol level (05/22/2013), Epistaxis, Erectile dysfunction (05/22/2013), Frequent falls (11/09/2018), GERD (gastroesophageal reflux disease), Hyperammonemia ([place]) (11/15/2018), Memory change (08/17/2017), RA (rheumatoid arthritis) ([place]) (08/17/2017), RBBB (12/15/2018), Rheumatoid arthritis ([place]) (2010), Shuffling gait (12/19/2018), Tobacco abuse (04/26/2012), Weight loss, unintentional (11/09/2018), and 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 here with ***   I spent 15 minutes with the patient. 50% of this time was spent   Butler Denmark, [place], DNP 08/08/2019, 2:55 PM [facility] [address] [place], [place] [address] [phone]

## 2019-08-09 ENCOUNTER — Encounter: Payer: Self-pay | Admitting: Neurology

## 2019-08-09 ENCOUNTER — Ambulatory Visit: Payer: 59 | Admitting: Neurology

## 2019-08-16 NOTE — Progress Notes (Signed)
Cardiology Office Note:    Date:  08/17/2019   ID:  William Gibson, DOB 05-Dec-1945, MRN [id number]  PCP:  Lucille Passy, MD  Cardiologist:  Coni Homesley  Electrophysiologist:  None   Referring MD: Lucille Passy, MD   No chief complaint on file.   History of Present Illness:    William Gibson is a 74 y.o. male with a hx of hyperlipidemia, right bundle branch block.  We are asked to see him today for further evaluation of his right bundle branch block  by Dr. .Deborra Medina.   Seen with wife , William Gibson  He has no complaints.   No concerns.  [name] does not do any exercise Lots of yard work .     No CP , no dyspnea. No syncope or presyncope.  Has a hx of frequent falling ,  Has not fallen recenlty since he stopped drinking .   Smokes 1.5 ppd.     Eats an unrestricted diet .   August 17, 2019: [name] is  seen today for follow-up visit.  He has a history of a right bundle branch block.  He has a long history of cigarette smoking and has at least some degree of COPD. We performed a Lexiscan Myoview study in October, 2020.  The Lexiscan was low risk.  There was no evidence of ischemia and no evidence of infarction.  Echocardiogram performed February 15, 2019 reveals normal left ventricular systolic function with grade 1 diastolic dysfunction. Still eating extra salt . Still smoking  No CP    Past Medical History:  Diagnosis Date  . Abnormal liver function tests 12/15/2018  . Alcoholic liver disease ([place]) 12/15/2018  . Alcoholism ([place])   . Elevated LDL cholesterol level 05/22/2013  . Epistaxis   . Erectile dysfunction 05/22/2013  . Frequent falls 11/09/2018  . GERD (gastroesophageal reflux disease)   . Hyperammonemia ([place]) 11/15/2018  . Memory change 08/17/2017  . RA (rheumatoid arthritis) ([place]) 08/17/2017  . RBBB 12/15/2018  . Rheumatoid arthritis ([place]) 2010  . Shuffling gait 12/19/2018  . Tobacco abuse 04/26/2012  . Weight loss, unintentional 11/09/2018  . White matter disease of brain due to  ischemia 12/15/2018    Past Surgical History:  Procedure Laterality Date  . NASAL ENDOSCOPY WITH EPISTAXIS CONTROL N/A 04/12/2018   Procedure: NASAL ENDOSCOPY WITH EPISTAXIS CONTROL;  Surgeon: Melissa Montane, MD;  Location: [facility];  Service: ENT;  Laterality: N/A;  . VASECTOMY    . VASECTOMY REVERSAL      Current Medications: Current Meds  Medication Sig  . cholecalciferol (VITAMIN D3) 25 MCG (1000 UNIT) tablet Take 1,000 Units by mouth every other day.  . Cobalamin Combinations (VITAMIN B12-FOLIC ACID) XX123456 MCG TABS 1 tab by mouth daily.  . meloxicam (MOBIC) 15 MG tablet Take 15 mg by mouth daily.  Marland Kitchen omeprazole (PRILOSEC OTC) 20 MG tablet Take 20 mg by mouth daily.  Marland Kitchen thiamine (VITAMIN B-1) 100 MG tablet Take 1 tablet (100 mg total) by mouth daily.     Allergies:   Patient has no known allergies.   Social History   Socioeconomic History  . Marital status: Married    Spouse name: Not on file  . Number of children: Not on file  . Years of education: Not on file  . Highest education level: Not on file  Occupational History  . Not on file  Tobacco Use  . Smoking status: Current Every Day Smoker    Packs/day: 1.50  Years: 58.00    Pack years: 87.00    Types: Cigarettes  . Smokeless tobacco: Never Used  Substance and Sexual Activity  . Alcohol use: Yes    Alcohol/week: 28.0 standard drinks    Types: 28 Shots of liquor per week    Comment: more than 5 drinks daily  . Drug use: No  . Sexual activity: Not on file  Other Topics Concern  . Not on file  Social History Narrative   Does not have a living will.   Desires CPR, does not want prolonged life support if futile.            Social Determinants of Health   Financial Resource Strain:   . Difficulty of Paying Living Expenses:   Food Insecurity:   . Worried About Charity fundraiser in the Last Year:   . Arboriculturist in the Last Year:   Transportation Needs:   . Film/video editor (Medical):   Marland Kitchen Lack  of Transportation (Non-Medical):   Physical Activity:   . Days of Exercise per Week:   . Minutes of Exercise per Session:   Stress:   . Feeling of Stress :   Social Connections:   . Frequency of Communication with Friends and Family:   . Frequency of Social Gatherings with Friends and Family:   . Attends Religious Services:   . Active Member of Clubs or Organizations:   . Attends Archivist Meetings:   Marland Kitchen Marital Status:      Family History: The patient's family history includes Heart disease in his mother; Lung cancer in his father. There is no history of Colon cancer, Esophageal cancer, or Stomach cancer.  ROS:   Please see the history of present illness.     All other systems reviewed and are negative.  EKGs/Labs/Other Studies Reviewed:    The following studies were reviewed today:   EKG:   August 17, 2019: Normal sinus rhythm at 80.  Right bundle branch block.  No changes from previous EKG.  Recent Labs: 11/09/2018: Hemoglobin 14.7; Platelets 190 12/14/2018: ALT 37; BUN 8; Creatinine, Ser 0.72; Potassium 4.7; Sodium 137  Recent Lipid Panel    Component Value Date/Time   CHOL 257 (H) 05/19/2018 0954   TRIG 109.0 05/19/2018 0954   HDL 65.10 05/19/2018 0954   CHOLHDL 4 05/19/2018 0954   VLDL 21.8 05/19/2018 0954   LDLCALC 170 (H) 05/19/2018 0954   LDLDIRECT 143.8 05/15/2013 1001     Physical Exam: Blood pressure (!) 138/92, pulse 80, height 5\' 9"  (1.753 m), weight 168 lb 8 oz (76.4 kg), SpO2 99 %.  GEN:  Elderly male,  NAD , hard of hearing  HEENT: Normal NECK: No JVD; No carotid bruits LYMPHATICS: No lymphadenopathy CARDIAC: RRR , no murmurs, rubs, gallops RESPIRATORY:  Clear to auscultation without rales, wheezing or rhonchi  ABDOMEN: Soft, non-tender, non-distended MUSCULOSKELETAL:  No edema; No deformity  SKIN: Warm and dry NEUROLOGIC:  Alert and oriented x 3       ASSESSMENT:    1. RBBB   2. Shortness of breath   3. Chronic obstructive  pulmonary disease, unspecified COPD type ([place])   4. Mixed hyperlipidemia    PLAN:    In order of problems listed above:  Right bundle branch block:    Stable, and his echocardiogram shows no significant LV dysfunction.  He has no significant valvular abnormalities. [place] study is low risk.  He has no ischemia and has  normal left ventricular systolic function. At this point appears to be very stable.  We will see him on an as-needed basis.  2.  COPD:  . Encouraged him to stop smoking.   Medication Adjustments/Labs and Tests Ordered: Current medicines are reviewed at length with the patient today.  Concerns regarding medicines are outlined above.  Orders Placed This Encounter  Procedures  . EKG 12-Lead   No orders of the defined types were placed in this encounter.   Patient Instructions  Medication Instructions:  Your physician recommends that you continue on your current medications as directed. Please refer to the Current Medication list given to you today.  *If you need a refill on your cardiac medications before your next appointment, please call your pharmacy*   Lab Work: None Ordered If you have labs (blood work) drawn today and your tests are completely normal, you will receive your results only by: Marland Kitchen MyChart Message (if you have MyChart) OR . A paper copy in the mail If you have any lab test that is abnormal or we need to change your treatment, we will call you to review the results.   Testing/Procedures: None Ordered   Follow-Up: At [facility], you and your health needs are our priority.  As part of our continuing mission to provide you with exceptional heart care, we have created designated Provider Care Teams.  These Care Teams include your primary Cardiologist (physician) and Advanced Practice Providers (APPs -  Physician Assistants and Nurse Practitioners) who all work together to provide you with the care you need, when you need it.  Your  next appointment:    As Needed  The format for your next appointment:   Either In Person or Virtual  Provider:   You may see Mertie Moores, MD or one of the following Advanced Practice Providers on your designated Care Team:    Richardson Dopp, PA-C  [place], [place]  Daune Perch, [place]        Signed, Mertie Moores, MD  08/17/2019 10:20 AM    [place]

## 2019-08-17 ENCOUNTER — Other Ambulatory Visit: Payer: Self-pay

## 2019-08-17 ENCOUNTER — Ambulatory Visit (INDEPENDENT_AMBULATORY_CARE_PROVIDER_SITE_OTHER): Payer: 59 | Admitting: Cardiovascular Disease

## 2019-08-17 ENCOUNTER — Encounter: Payer: Self-pay | Admitting: Cardiovascular Disease

## 2019-08-17 VITALS — BP 138/92 | HR 80 | Ht 69.0 in | Wt 168.5 lb

## 2019-08-17 DIAGNOSIS — J449 Chronic obstructive pulmonary disease, unspecified: Secondary | ICD-10-CM | POA: Diagnosis not present

## 2019-08-17 DIAGNOSIS — I4510 Unspecified right bundle-branch block: Secondary | ICD-10-CM | POA: Diagnosis not present

## 2019-08-17 DIAGNOSIS — E782 Mixed hyperlipidemia: Secondary | ICD-10-CM | POA: Diagnosis not present

## 2019-08-17 DIAGNOSIS — R0602 Shortness of breath: Secondary | ICD-10-CM

## 2019-08-17 NOTE — Patient Instructions (Addendum)
Medication Instructions:  Your physician recommends that you continue on your current medications as directed. Please refer to the Current Medication list given to you today.  *If you need a refill on your cardiac medications before your next appointment, please call your pharmacy*   Lab Work: None Ordered If you have labs (blood work) drawn today and your tests are completely normal, you will receive your results only by: . MyChart Message (if you have MyChart) OR . A paper copy in the mail If you have any lab test that is abnormal or we need to change your treatment, we will call you to review the results.   Testing/Procedures: None Ordered   Follow-Up: At [facility], you and your health needs are our priority.  As part of our continuing mission to provide you with exceptional heart care, we have created designated Provider Care Teams.  These Care Teams include your primary Cardiologist (physician) and Advanced Practice Providers (APPs -  Physician Assistants and Nurse Practitioners) who all work together to provide you with the care you need, when you need it.   Your next appointment:    As Needed  The format for your next appointment:   Either In Person or Virtual  Provider:   You may see Philip Nahser, MD or one of the following Advanced Practice Providers on your designated Care Team:    Scott Weaver, PA-C  Vin Bhagat, PA-C  Janine Hammond, NP     

## 2019-10-16 ENCOUNTER — Other Ambulatory Visit: Payer: Self-pay

## 2019-10-16 ENCOUNTER — Ambulatory Visit: Admission: EM | Admit: 2019-10-16 | Discharge: 2019-10-16 | Discharge status: Absent without leave | Payer: 59

## 2019-10-23 ENCOUNTER — Encounter (HOSPITAL_COMMUNITY): Payer: Self-pay

## 2019-10-23 ENCOUNTER — Emergency Department (HOSPITAL_COMMUNITY)
Admission: EM | Admit: 2019-10-23 | Discharge: 2019-10-23 | Disposition: A | Discharge status: Home / Self Care | Payer: 59 | Attending: Emergency Medicine | Admitting: Emergency Medicine

## 2019-10-23 ENCOUNTER — Other Ambulatory Visit: Payer: Self-pay

## 2019-10-23 DIAGNOSIS — F10129 Alcohol abuse with intoxication, unspecified: Secondary | ICD-10-CM | POA: Insufficient documentation

## 2019-10-23 DIAGNOSIS — F17210 Nicotine dependence, cigarettes, uncomplicated: Secondary | ICD-10-CM | POA: Insufficient documentation

## 2019-10-23 DIAGNOSIS — Y900 Blood alcohol level of less than 20 mg/100 ml: Secondary | ICD-10-CM | POA: Insufficient documentation

## 2019-10-23 DIAGNOSIS — F1010 Alcohol abuse, uncomplicated: Secondary | ICD-10-CM

## 2019-10-23 LAB — CBC WITH DIFFERENTIAL/PLATELET
Abs Immature Granulocytes: 0.02 10*3/uL (ref 0.00–0.07)
Basophils Absolute: 0.1 10*3/uL (ref 0.0–0.1)
Basophils Relative: 1 %
Eosinophils Absolute: 0.2 10*3/uL (ref 0.0–0.5)
Eosinophils Relative: 3 %
HCT: 42.7 % (ref 39.0–52.0)
Hemoglobin: 14.1 g/dL (ref 13.0–17.0)
Immature Granulocytes: 0 %
Lymphocytes Relative: 26 %
Lymphs Abs: 1.8 10*3/uL (ref 0.7–4.0)
MCH: 33.7 pg (ref 26.0–34.0)
MCHC: 33 g/dL (ref 30.0–36.0)
MCV: 101.9 fL — ABNORMAL HIGH (ref 80.0–100.0)
Monocytes Absolute: 0.8 10*3/uL (ref 0.1–1.0)
Monocytes Relative: 11 %
Neutro Abs: 4.1 10*3/uL (ref 1.7–7.7)
Neutrophils Relative %: 59 %
Platelets: 187 10*3/uL (ref 150–400)
RBC: 4.19 MIL/uL — ABNORMAL LOW (ref 4.22–5.81)
RDW: 13.1 % (ref 11.5–15.5)
WBC: 7.1 10*3/uL (ref 4.0–10.5)
nRBC: 0 % (ref 0.0–0.2)

## 2019-10-23 LAB — COMPREHENSIVE METABOLIC PANEL
ALT: 45 U/L — ABNORMAL HIGH (ref 0–44)
AST: 59 U/L — ABNORMAL HIGH (ref 15–41)
Albumin: 3.8 g/dL (ref 3.5–5.0)
Alkaline Phosphatase: 76 U/L (ref 38–126)
Anion gap: 10 (ref 5–15)
BUN: 12 mg/dL (ref 8–23)
CO2: 28 mmol/L (ref 22–32)
Calcium: 9.2 mg/dL (ref 8.9–10.3)
Chloride: 102 mmol/L (ref 98–111)
Creatinine, Ser: 0.76 mg/dL (ref 0.61–1.24)
GFR calc Af Amer: >60 mL/min (ref >60)
GFR calc non Af Amer: >60 mL/min (ref >60)
Glucose, Bld: 95 mg/dL (ref 70–99)
Potassium: 4.1 mmol/L (ref 3.5–5.1)
Sodium: 140 mmol/L (ref 135–145)
Total Bilirubin: 0.8 mg/dL (ref 0.3–1.2)
Total Protein: 8.1 g/dL (ref 6.5–8.1)

## 2019-10-23 LAB — ETHANOL: Alcohol, Ethyl (B): <10 mg/dL (ref <10)

## 2019-10-23 MED ORDER — CHLORDIAZEPOXIDE HCL 25 MG PO CAPS: ORAL_CAPSULE | ORAL | 0 refills | Status: DC

## 2019-10-23 MED ORDER — CHLORDIAZEPOXIDE HCL 25 MG PO CAPS
50.0000 mg | ORAL_CAPSULE | Freq: Once | ORAL | Status: AC
  Administered 2019-10-23: 50 mg via ORAL
  Filled 2019-10-23: qty 2

## 2019-10-23 NOTE — ED Provider Notes (Signed)
74 yo M with a chief complaint of alcohol withdrawal.  The patient was a heavy alcoholic and try to quit but had to bed tremors and confusion and so came to the ED for evaluation.  Was given Librium with some improvement.  Plan for lab work and reassessment if feeling better we will discharge home.  Patient is feeling better would like to try and go home.  Librium taper already sent to his pharmacy.  PCP follow-up.   Deno Etienne, DO 10/23/19 2001

## 2019-10-23 NOTE — Discharge Instructions (Signed)
Follow up with a treatment center.  Take the librium to help withdrawal symptoms

## 2019-10-23 NOTE — ED Triage Notes (Signed)
Patient is here for alcohol withdrawal. Patient states his last drink was yesterday. Patient states he had 5 drinks yesterday. Patient states he normally drinks 4 gallons of alcohol a week.

## 2019-10-23 NOTE — ED Provider Notes (Signed)
[facility] DEPT Provider Note   CSN: [id number] Arrival date & time: 10/23/19  1532     History Chief Complaint  Patient presents with  . Alcohol Intoxication    William Gibson is a 74 y.o. male.  HPI   Patient presented to the ED for evaluation of alcohol abuse.  Patient states he has been a chronic drinker for a number of years.  He has tried to quit in the past.  Patient states he drinks 4 gallons of alcohol a week.  He had 5 drinks yesterday.  Patient states he wants to try to quit drinking but would like to do it as an outpatient.  He does have a problem with getting the shakes when he stops drinking.  Last week he tried to quit but became very shaky later in the evening so he started drinking again.  He contacted his doctor who suggested he come to the emergency room to be evaluated abdominal pain.  He has never had seizures or DTs.  Past Medical History:  Diagnosis Date  . Abnormal liver function tests 12/15/2018  . Alcoholic liver disease ([place]) 12/15/2018  . Alcoholism ([place])   . Elevated LDL cholesterol level 05/22/2013  . Epistaxis   . Erectile dysfunction 05/22/2013  . Frequent falls 11/09/2018  . GERD (gastroesophageal reflux disease)   . Hyperammonemia ([place]) 11/15/2018  . Memory change 08/17/2017  . RA (rheumatoid arthritis) ([place]) 08/17/2017  . RBBB 12/15/2018  . Rheumatoid arthritis ([place]) 2010  . Shuffling gait 12/19/2018  . Tobacco abuse 04/26/2012  . Weight loss, unintentional 11/09/2018  . 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 12/15/2018    Patient Active Problem List   Diagnosis Date Noted  . Hyperlipidemia 02/06/2019  . Shuffling gait 12/19/2018  . Abnormal liver function tests 12/15/2018  . Alcoholic liver disease ([place]) 12/15/2018  . RBBB 12/15/2018  . 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 12/15/2018  . Hyperammonemia ([place]) 11/15/2018  . Weight loss, unintentional 11/09/2018  . Frequent falls 11/09/2018  . RA  (rheumatoid arthritis) ([place]) 08/17/2017  . Memory change 08/17/2017  . Alcoholism ([place]) 12/15/2016  . Osteoarthritis 01/30/2016  . GERD (gastroesophageal reflux disease) 05/23/2014  . Elevated LDL cholesterol level 05/22/2013  . Erectile dysfunction 05/22/2013  . Tobacco abuse 04/26/2012    Past Surgical History:  Procedure Laterality Date  . NASAL ENDOSCOPY WITH EPISTAXIS CONTROL N/A 04/12/2018   Procedure: NASAL ENDOSCOPY WITH EPISTAXIS CONTROL;  Surgeon: Melissa Montane, MD;  Location: [facility];  Service: ENT;  Laterality: N/A;  . VASECTOMY    . VASECTOMY REVERSAL         Family History  Problem Relation Age of Onset  . Lung cancer Father   . Heart disease Mother   . Colon cancer Neg Hx   . Esophageal cancer Neg Hx   . Stomach cancer Neg Hx     Social History   Tobacco Use  . Smoking status: Current Every Day Smoker    Packs/day: 1.50    Years: 58.00    Pack years: 87.00    Types: Cigarettes  . Smokeless tobacco: Never Used  Vaping Use  . Vaping Use: Never used  Substance Use Topics  . Alcohol use: Yes  . Drug use: No    Home Medications Prior to Admission medications   Medication Sig Start Date End Date Taking? Authorizing Provider  chlordiazePOXIDE (LIBRIUM) 25 MG capsule 50mg  PO TID x 1D, then 25-50mg  PO BID X  1D, then 25-50mg  PO QD X 1D 10/23/19   Dorie Rank, MD  cholecalciferol (VITAMIN D3) 25 MCG (1000 UNIT) tablet Take 1,000 Units by mouth every other day.    [provider]  Cobalamin Combinations (VITAMIN B12-FOLIC ACID) 694-854 MCG TABS 1 tab by mouth daily. 11/16/18   Lucille Passy, MD  meloxicam (MOBIC) 15 MG tablet Take 15 mg by mouth daily. 07/18/15   [provider]  omeprazole (PRILOSEC OTC) 20 MG tablet Take 20 mg by mouth daily.    [provider]  thiamine (VITAMIN B-1) 100 MG tablet Take 1 tablet (100 mg total) by mouth daily. 11/16/18   Lucille Passy, MD    Allergies    Patient has no known allergies.  Review of  Systems   Review of Systems  All other systems reviewed and are negative.   Physical Exam Updated Vital Signs BP (!) 169/104   Pulse 91   Temp 98.2 F (36.8 C) (Oral)   Resp 16   Ht 1.753 m (5\' 9" )   Wt 73 kg   SpO2 93%   BMI 23.78 kg/m   Physical Exam Vitals and nursing note reviewed.  Constitutional:      General: He is not in acute distress.    Appearance: He is well-developed.  HENT:     Head: Normocephalic and atraumatic.     Right Ear: External ear normal.     Left Ear: External ear normal.  Eyes:     General: No scleral icterus.       Right eye: No discharge.        Left eye: No discharge.     Conjunctiva/sclera: Conjunctivae normal.  Neck:     Trachea: No tracheal deviation.  Cardiovascular:     Rate and Rhythm: Normal rate and regular rhythm.  Pulmonary:     Effort: Pulmonary effort is normal. No respiratory distress.     Breath sounds: Normal breath sounds. No stridor. No wheezing or rales.  Abdominal:     General: Bowel sounds are normal. There is no distension.     Palpations: Abdomen is soft.     Tenderness: There is no abdominal tenderness. There is no guarding or rebound.  Musculoskeletal:        General: No tenderness.     Cervical back: Neck supple.  Skin:    General: Skin is warm and dry.     Findings: No rash.  Neurological:     Mental Status: He is alert.     Cranial Nerves: No cranial nerve deficit (no facial droop, extraocular movements intact, no slurred speech).     Sensory: No sensory deficit.     Motor: No abnormal muscle tone or seizure activity.     Coordination: Coordination normal.     ED Results / Procedures / Treatments   Labs (all labs ordered are listed, but only abnormal results are displayed) Labs Reviewed  CBC WITH DIFFERENTIAL/PLATELET  COMPREHENSIVE METABOLIC PANEL  ETHANOL    EKG None  Radiology No results found.  Procedures Procedures (including critical care time)  Medications Ordered in  ED Medications  chlordiazePOXIDE (LIBRIUM) capsule 50 mg (has no administration in time range)    ED Course  I have reviewed the triage vital signs and the nursing notes.  Pertinent labs & imaging results that were available during my care of the patient were reviewed by me and considered in my medical decision making (see chart for details).    MDM  Rules/Calculators/A&P                          Pt presents with alcohol abuse.  Pt denies si or hi.  Does not want inpatient treatment.   Pt is not tremulous, no signs of severe withdrawal.  Will give dose of librium, check labs.  Pt would prefer outpatient management.  Care turned over to Dr Tyrone Nine to check the labs. Final Clinical Impression(s) / ED Diagnoses Final diagnoses:  Alcohol abuse    Rx / DC Orders ED Discharge Orders         Ordered    chlordiazePOXIDE (LIBRIUM) 25 MG capsule     Discontinue  Reprint     10/23/19 1645           Dorie Rank, MD 10/23/19 [phone]

## 2019-11-02 ENCOUNTER — Ambulatory Visit: Payer: 59 | Admitting: Family Medicine

## 2019-11-03 ENCOUNTER — Inpatient Hospital Stay (HOSPITAL_COMMUNITY)
Admission: EM | Admit: 2019-11-03 | Discharge: 2019-11-10 | DRG: 896 | Disposition: A | Discharge status: Rehabilitation Facility | Payer: 59 | Attending: Internal Medicine | Admitting: Internal Medicine

## 2019-11-03 ENCOUNTER — Emergency Department (HOSPITAL_COMMUNITY): Payer: 59

## 2019-11-03 ENCOUNTER — Other Ambulatory Visit: Payer: Self-pay

## 2019-11-03 DIAGNOSIS — S069XAA Unspecified intracranial injury with loss of consciousness status unknown, initial encounter: Secondary | ICD-10-CM

## 2019-11-03 DIAGNOSIS — F10931 Alcohol use, unspecified with withdrawal delirium: Secondary | ICD-10-CM

## 2019-11-03 DIAGNOSIS — S0990XA Unspecified injury of head, initial encounter: Secondary | ICD-10-CM

## 2019-11-03 DIAGNOSIS — I609 Nontraumatic subarachnoid hemorrhage, unspecified: Secondary | ICD-10-CM

## 2019-11-03 DIAGNOSIS — F10229 Alcohol dependence with intoxication, unspecified: Secondary | ICD-10-CM | POA: Diagnosis present

## 2019-11-03 DIAGNOSIS — F10231 Alcohol dependence with withdrawal delirium: Principal | ICD-10-CM | POA: Diagnosis present

## 2019-11-03 DIAGNOSIS — R42 Dizziness and giddiness: Secondary | ICD-10-CM | POA: Diagnosis not present

## 2019-11-03 DIAGNOSIS — S02119A Unspecified fracture of occiput, initial encounter for closed fracture: Secondary | ICD-10-CM | POA: Diagnosis present

## 2019-11-03 DIAGNOSIS — Z720 Tobacco use: Secondary | ICD-10-CM | POA: Diagnosis present

## 2019-11-03 DIAGNOSIS — R1310 Dysphagia, unspecified: Secondary | ICD-10-CM | POA: Diagnosis present

## 2019-11-03 DIAGNOSIS — R0682 Tachypnea, not elsewhere classified: Secondary | ICD-10-CM

## 2019-11-03 DIAGNOSIS — Z8673 Personal history of transient ischemic attack (TIA), and cerebral infarction without residual deficits: Secondary | ICD-10-CM

## 2019-11-03 DIAGNOSIS — R55 Syncope and collapse: Secondary | ICD-10-CM | POA: Diagnosis not present

## 2019-11-03 DIAGNOSIS — S199XXA Unspecified injury of neck, initial encounter: Secondary | ICD-10-CM | POA: Diagnosis not present

## 2019-11-03 DIAGNOSIS — S0291XA Unspecified fracture of skull, initial encounter for closed fracture: Secondary | ICD-10-CM

## 2019-11-03 DIAGNOSIS — S066X1A Traumatic subarachnoid hemorrhage with loss of consciousness of 30 minutes or less, initial encounter: Secondary | ICD-10-CM | POA: Diagnosis not present

## 2019-11-03 DIAGNOSIS — S065X9A Traumatic subdural hemorrhage with loss of consciousness of unspecified duration, initial encounter: Secondary | ICD-10-CM | POA: Diagnosis present

## 2019-11-03 DIAGNOSIS — R296 Repeated falls: Secondary | ICD-10-CM | POA: Diagnosis present

## 2019-11-03 DIAGNOSIS — R4020 Unspecified coma: Secondary | ICD-10-CM

## 2019-11-03 DIAGNOSIS — K861 Other chronic pancreatitis: Secondary | ICD-10-CM | POA: Diagnosis present

## 2019-11-03 DIAGNOSIS — S066X9A Traumatic subarachnoid hemorrhage with loss of consciousness of unspecified duration, initial encounter: Secondary | ICD-10-CM | POA: Diagnosis present

## 2019-11-03 DIAGNOSIS — Y906 Blood alcohol level of 120-199 mg/100 ml: Secondary | ICD-10-CM | POA: Diagnosis present

## 2019-11-03 DIAGNOSIS — Z8249 Family history of ischemic heart disease and other diseases of the circulatory system: Secondary | ICD-10-CM

## 2019-11-03 DIAGNOSIS — F1021 Alcohol dependence, in remission: Secondary | ICD-10-CM | POA: Diagnosis present

## 2019-11-03 DIAGNOSIS — W19XXXA Unspecified fall, initial encounter: Secondary | ICD-10-CM

## 2019-11-03 DIAGNOSIS — G92 Toxic encephalopathy: Secondary | ICD-10-CM | POA: Diagnosis present

## 2019-11-03 DIAGNOSIS — H9190 Unspecified hearing loss, unspecified ear: Secondary | ICD-10-CM | POA: Diagnosis present

## 2019-11-03 DIAGNOSIS — Y92009 Unspecified place in unspecified non-institutional (private) residence as the place of occurrence of the external cause: Secondary | ICD-10-CM

## 2019-11-03 DIAGNOSIS — Z20822 Contact with and (suspected) exposure to covid-19: Secondary | ICD-10-CM | POA: Diagnosis present

## 2019-11-03 DIAGNOSIS — K709 Alcoholic liver disease, unspecified: Secondary | ICD-10-CM | POA: Diagnosis present

## 2019-11-03 DIAGNOSIS — K860 Alcohol-induced chronic pancreatitis: Secondary | ICD-10-CM

## 2019-11-03 DIAGNOSIS — Z801 Family history of malignant neoplasm of trachea, bronchus and lung: Secondary | ICD-10-CM

## 2019-11-03 DIAGNOSIS — M069 Rheumatoid arthritis, unspecified: Secondary | ICD-10-CM | POA: Diagnosis present

## 2019-11-03 DIAGNOSIS — S299XXA Unspecified injury of thorax, initial encounter: Secondary | ICD-10-CM | POA: Diagnosis not present

## 2019-11-03 DIAGNOSIS — I452 Bifascicular block: Secondary | ICD-10-CM | POA: Diagnosis present

## 2019-11-03 DIAGNOSIS — I10 Essential (primary) hypertension: Secondary | ICD-10-CM | POA: Diagnosis present

## 2019-11-03 DIAGNOSIS — Z781 Physical restraint status: Secondary | ICD-10-CM

## 2019-11-03 DIAGNOSIS — F17210 Nicotine dependence, cigarettes, uncomplicated: Secondary | ICD-10-CM | POA: Diagnosis present

## 2019-11-03 DIAGNOSIS — R471 Dysarthria and anarthria: Secondary | ICD-10-CM | POA: Diagnosis present

## 2019-11-03 DIAGNOSIS — Z8679 Personal history of other diseases of the circulatory system: Secondary | ICD-10-CM

## 2019-11-03 DIAGNOSIS — R2689 Other abnormalities of gait and mobility: Secondary | ICD-10-CM | POA: Diagnosis present

## 2019-11-03 DIAGNOSIS — E871 Hypo-osmolality and hyponatremia: Secondary | ICD-10-CM | POA: Diagnosis present

## 2019-11-03 DIAGNOSIS — W1830XA Fall on same level, unspecified, initial encounter: Secondary | ICD-10-CM | POA: Diagnosis present

## 2019-11-03 LAB — COMPREHENSIVE METABOLIC PANEL
ALT: 70 U/L — ABNORMAL HIGH (ref 0–44)
AST: 80 U/L — ABNORMAL HIGH (ref 15–41)
Albumin: 3.2 g/dL — ABNORMAL LOW (ref 3.5–5.0)
Alkaline Phosphatase: 67 U/L (ref 38–126)
Anion gap: 12 (ref 5–15)
BUN: 7 mg/dL — ABNORMAL LOW (ref 8–23)
CO2: 21 mmol/L — ABNORMAL LOW (ref 22–32)
Calcium: 8.9 mg/dL (ref 8.9–10.3)
Chloride: 103 mmol/L (ref 98–111)
Creatinine, Ser: 1.01 mg/dL (ref 0.61–1.24)
GFR calc Af Amer: >60 mL/min (ref >60)
GFR calc non Af Amer: >60 mL/min (ref >60)
Glucose, Bld: 120 mg/dL — ABNORMAL HIGH (ref 70–99)
Potassium: 3.9 mmol/L (ref 3.5–5.1)
Sodium: 136 mmol/L (ref 135–145)
Total Bilirubin: 0.4 mg/dL (ref 0.3–1.2)
Total Protein: 7.1 g/dL (ref 6.5–8.1)

## 2019-11-03 LAB — CBC
HCT: 41.2 % (ref 39.0–52.0)
Hemoglobin: 13.7 g/dL (ref 13.0–17.0)
MCH: 33.2 pg (ref 26.0–34.0)
MCHC: 33.3 g/dL (ref 30.0–36.0)
MCV: 99.8 fL (ref 80.0–100.0)
Platelets: 219 10*3/uL (ref 150–400)
RBC: 4.13 MIL/uL — ABNORMAL LOW (ref 4.22–5.81)
RDW: 12.6 % (ref 11.5–15.5)
WBC: 8.1 10*3/uL (ref 4.0–10.5)
nRBC: 0 % (ref 0.0–0.2)

## 2019-11-03 LAB — TROPONIN I (HIGH SENSITIVITY): Troponin I (High Sensitivity): 6 ng/L (ref <18)

## 2019-11-03 LAB — ETHANOL: Alcohol, Ethyl (B): 190 mg/dL — ABNORMAL HIGH (ref <10)

## 2019-11-03 LAB — LIPASE, BLOOD: Lipase: 43 U/L (ref 11–51)

## 2019-11-03 NOTE — ED Provider Notes (Signed)
[facility] EMERGENCY DEPARTMENT Provider Note   CSN: [id number] Arrival date & time: 11/03/19  2027     History Chief Complaint  Patient presents with  . Fall    William Gibson is a 74 y.o. male w hx of significant alcohol use who presents via EMS for fall at home. Pt Pt amnestic to event. Patient denies fall earlier today. Pt denies HA, neck pain, dizziness, CP, SOB, abdominal pain. Pt oriented only to self. Pt endorses daily alcohol consumption, denies other substance use. Initially patient denies living with anyone else, however wife to bedside later in ED course. Wife endorses witnessed fall by neighbor from standing. Patient fell backwards onto concrete driveway. Endorses prolonged LOC ~10 min. Wife does endorse frequent falls and unsteady gait.   The history is provided by the patient, the spouse and the EMS personnel. The history is limited by the condition of the patient.  Fall This is a new problem. Episode frequency: Wife endorses frequent falls. Pertinent negatives include no chest pain, no abdominal pain and no headaches.       Past Medical History:  Diagnosis Date  . Abnormal liver function tests 12/15/2018  . Alcoholic liver disease ([place]) 12/15/2018  . Alcoholism ([place])   . Elevated LDL cholesterol level 05/22/2013  . Epistaxis   . Erectile dysfunction 05/22/2013  . Frequent falls 11/09/2018  . GERD (gastroesophageal reflux disease)   . Hyperammonemia ([place]) 11/15/2018  . Memory change 08/17/2017  . RA (rheumatoid arthritis) ([place]) 08/17/2017  . RBBB 12/15/2018  . Rheumatoid arthritis ([place]) 2010  . Shuffling gait 12/19/2018  . Tobacco abuse 04/26/2012  . Weight loss, unintentional 11/09/2018  . 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 12/15/2018    Patient Active Problem List   Diagnosis Date Noted  . Hyperlipidemia 02/06/2019  . Shuffling gait 12/19/2018  . Abnormal liver function tests 12/15/2018  . Alcoholic liver disease ([place]) 12/15/2018  .  RBBB 12/15/2018  . 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 12/15/2018  . Hyperammonemia ([place]) 11/15/2018  . Weight loss, unintentional 11/09/2018  . Frequent falls 11/09/2018  . RA (rheumatoid arthritis) ([place]) 08/17/2017  . Memory change 08/17/2017  . Alcoholism ([place]) 12/15/2016  . Osteoarthritis 01/30/2016  . GERD (gastroesophageal reflux disease) 05/23/2014  . Elevated LDL cholesterol level 05/22/2013  . Erectile dysfunction 05/22/2013  . Tobacco abuse 04/26/2012    Past Surgical History:  Procedure Laterality Date  . NASAL ENDOSCOPY WITH EPISTAXIS CONTROL N/A 04/12/2018   Procedure: NASAL ENDOSCOPY WITH EPISTAXIS CONTROL;  Surgeon: Melissa Montane, MD;  Location: [facility];  Service: ENT;  Laterality: N/A;  . VASECTOMY    . VASECTOMY REVERSAL         Family History  Problem Relation Age of Onset  . Lung cancer Father   . Heart disease Mother   . Colon cancer Neg Hx   . Esophageal cancer Neg Hx   . Stomach cancer Neg Hx     Social History   Tobacco Use  . Smoking status: Current Every Day Smoker    Packs/day: 1.50    Years: 58.00    Pack years: 87.00    Types: Cigarettes  . Smokeless tobacco: Never Used  Vaping Use  . Vaping Use: Never used  Substance Use Topics  . Alcohol use: Yes  . Drug use: No    Home Medications Prior to Admission medications   Medication Sig Start Date End Date Taking? Authorizing Provider  Cyanocobalamin (VITAMIN B-12  PO) Take 1 tablet by mouth daily.   Yes [provider]  meloxicam (MOBIC) 15 MG tablet Take 15 mg by mouth daily. 07/18/15  Yes [provider]  omeprazole (PRILOSEC OTC) 20 MG tablet Take 20 mg by mouth daily.   Yes [provider]  thiamine (VITAMIN B-1) 100 MG tablet Take 1 tablet (100 mg total) by mouth daily. 11/16/18  Yes Lucille Passy, MD  chlordiazePOXIDE (LIBRIUM) 25 MG capsule 50mg  PO TID x 1D, then 25-50mg  PO BID X 1D, then 25-50mg  PO QD X 1D Patient not taking: Reported on 11/03/2019  10/23/19   Dorie Rank, MD  Cobalamin Combinations (VITAMIN B12-FOLIC ACID) 010-272 MCG TABS 1 tab by mouth daily. Patient not taking: Reported on 11/03/2019 11/16/18   Lucille Passy, MD    Allergies    Patient has no known allergies.  Review of Systems   Review of Systems  Unable to perform ROS: Mental status change  Constitutional: Negative for fever.  Eyes: Negative for visual disturbance.  Cardiovascular: Negative for chest pain.  Gastrointestinal: Negative for abdominal pain, nausea and vomiting.  Musculoskeletal: Negative for back pain and neck pain.  Skin: Negative for rash and wound.  Neurological: Negative for dizziness and headaches.    Physical Exam Updated Vital Signs BP 135/90   Pulse 84   Temp 98.3 F (36.8 C) (Oral)   Resp (!) 22   Ht 5\' 10"  (1.778 m)   Wt 74.8 kg   SpO2 94%   BMI 23.68 kg/m   Physical Exam Vitals and nursing note reviewed.  Constitutional:      General: He is not in acute distress.    Appearance: Normal appearance. He is not toxic-appearing or diaphoretic.  HENT:     Head: Normocephalic.  Eyes:     Extraocular Movements: Extraocular movements intact.     Conjunctiva/sclera: Conjunctivae normal.  Cardiovascular:     Rate and Rhythm: Regular rhythm.     Heart sounds: Normal heart sounds. No murmur heard.   Pulmonary:     Effort: No respiratory distress.     Breath sounds: Normal breath sounds. No wheezing.  Abdominal:     Palpations: Abdomen is soft.     Tenderness: There is no abdominal tenderness. There is no guarding or rebound.  Musculoskeletal:     Right lower leg: Edema present.     Left lower leg: Edema present.     Comments: Trace pitting edema bilaterally.   Neurological:     General: No focal deficit present.     Mental Status: He is alert. He is disoriented.     Cranial Nerves: No cranial nerve deficit.     Sensory: No sensory deficit.     Motor: No weakness.     Coordination: Coordination normal.     Comments: Pt  oriented only to self      ED Results / Procedures / Treatments   Labs (all labs ordered are listed, but only abnormal results are displayed) Labs Reviewed  CBC - Abnormal; Notable for the following components:      Result Value   RBC 4.13 (*)    All other components within normal limits  COMPREHENSIVE METABOLIC PANEL - Abnormal; Notable for the following components:   CO2 21 (*)    Glucose, Bld 120 (*)    BUN 7 (*)    Albumin 3.2 (*)    AST 80 (*)    ALT 70 (*)    All other components  within normal limits  ETHANOL - Abnormal; Notable for the following components:   Alcohol, Ethyl (B) 190 (*)    All other components within normal limits  SARS CORONAVIRUS 2 BY RT PCR (HOSPITAL ORDER, [facility])  LIPASE, BLOOD  URINALYSIS, ROUTINE W REFLEX MICROSCOPIC  RAPID URINE DRUG SCREEN, HOSP PERFORMED  TROPONIN I (HIGH SENSITIVITY)  TROPONIN I (HIGH SENSITIVITY)    EKG EKG Interpretation  Date/Time:  Friday November 03 2019 20:36:40 EDT Ventricular Rate:  83 PR Interval:    QRS Duration: 145 QT Interval:  410 QTC Calculation: 482 R Axis:   103 Text Interpretation: Sinus rhythm Atrial premature complex Consider left atrial enlargement RBBB and LPFB No significant change since last tracing Confirmed by Lacretia Leigh ([id number]) on 11/03/2019 9:13:08 PM   Radiology CT Head Wo Contrast  Addendum Date: 11/03/2019   ADDENDUM REPORT: 11/03/2019 21:38 ADDENDUM: Upon further evaluation, an acute nondisplaced fracture is seen involving the occipital region of the skull, to the right of midline. This extends inferiorly along the skull base to the level of the foramen magnum. Electronically Signed   By: Virgina Norfolk M.D.   On: 11/03/2019 21:38   Result Date: 11/03/2019 CLINICAL DATA:  Status post fall. EXAM: CT HEAD WITHOUT CONTRAST TECHNIQUE: Contiguous axial images were obtained from the base of the skull through the vertex without intravenous contrast. COMPARISON:   November 09, 2018 FINDINGS: Brain: There is mild cerebral atrophy with widening of the extra-axial spaces and ventricular dilatation. There are areas of decreased attenuation within the white matter tracts of the supratentorial brain, consistent with microvascular disease changes. A mild-to-moderate amount of subarachnoid blood is seen along the tentorium cerebelli, bilaterally, as well as along the anterior aspect of the right temporal lobe and left frontal lobe. Involvement of the right temporoparietal region is also seen. There is no evidence of associated mass effect or midline shift. Vascular: No hyperdense vessel or unexpected calcification. Skull: Normal. Negative for fracture or focal lesion. Sinuses/Orbits: No acute finding. Other: Tiny metallic density foci are seen adjacent to the outer table of the skull along the right frontal region. These are present on the prior study. IMPRESSION: 1. Mild-to-moderate amount of subarachnoid blood along the tentorium cerebelli, bilaterally, as well as along the right temporoparietal region, anterior aspect of the right temporal lobe and left frontal lobe. MRI correlation is recommended. 2. No evidence of associated mass effect or midline shift. Electronically Signed: By: Virgina Norfolk M.D. On: 11/03/2019 21:27   CT Cervical Spine Wo Contrast  Result Date: 11/03/2019 CLINICAL DATA:  Status post fall. EXAM: CT CERVICAL SPINE WITHOUT CONTRAST TECHNIQUE: Multidetector CT imaging of the cervical spine was performed without intravenous contrast. Multiplanar CT image reconstructions were also generated. COMPARISON:  None. FINDINGS: Alignment: Normal. Skull base and vertebrae: No acute fracture involving the cervical spine. An acute nondisplaced fracture is seen involving the occipital region of the skull, to the right of midline. This extends inferiorly along the skull base to the level of the foramen magnum. Soft tissues and spinal canal: No prevertebral fluid or  swelling. No visible canal hematoma. Disc levels: There is marked severity narrowing of the anterior atlanto axial articulation. Mild anterior osteophyte formation is seen at the levels of C3-C4, C4-C5, C5-C6 and C6-C7. Normal multilevel intervertebral disc spaces are seen. Mild bilateral multilevel facet joint hypertrophy is seen. Upper chest: Mild to moderate severity chronic appearing increased lung markings are present. Other: None. IMPRESSION: 1. Acute nondisplaced fracture  involving the occipital region of the skull, to the right of midline. This extends inferiorly along the skull base to the level of the foramen magnum. 2. No acute fracture involving the cervical spine. Electronically Signed   By: Virgina Norfolk M.D.   On: 11/03/2019 21:38   DG Chest Portable 1 View  Result Date: 11/03/2019 CLINICAL DATA:  [place], syncope, dizziness, alcohol abuse EXAM: PORTABLE CHEST 1 VIEW COMPARISON:  09/26/2014 FINDINGS: Single frontal view of the chest demonstrates an unremarkable cardiac silhouette. No airspace disease, effusion, or pneumothorax. No acute bony abnormalities. IMPRESSION: 1. No acute intrathoracic process. Electronically Signed   By: Randa Ngo M.D.   On: 11/03/2019 21:42    Procedures Procedures (including critical care time)  Medications Ordered in ED Medications - No data to display  ED Course  I have reviewed the triage vital signs and the nursing notes.  Pertinent labs & imaging results that were available during my care of the patient were reviewed by me and considered in my medical decision making (see chart for details).    MDM Rules/Calculators/A&P                          William Gibson is a 74 y.o. male who presents as a non-leveled trauma activation after sustaining injuries in a fall earlier this evening. Pt amnestic to event and does not remember fall. Per wife patient fell backwards striking back of head on concrete driveway. Prolonged LOC. Pt oriented only to  self and denies complaints at this time. History is obtained from the patient's wife as he is disoriented. On initial exam patient was GCS 14 ABC intact, and hemodynamically stable. The patient denied HA, neck pain, dizziness, CP, SOB, abdominal pain. Pt endorsed daily alcohol consumption which is consistent with medical record. On exam no obvious signs of trauma. Patient denied pain on palpation of scalp, C-spine, chest, abdomen. Significant for trace b/l LE edema. Lungs clear to auscultation. CN II-XII grossly intact. Intact coordination on finger-to-nose testing. Intact strength/sensation on all extremities. Due to AMS CT head/C spine were completed and significant for mild-to-moderate SAH along tentorium cerebelli, as well as along R temporoparietal region, anterior aspect of R temporal lobe and L frontal lobe. No midline shift, mass affect. Likely traumatic cause of bleeding in setting of underlying acute nondisplaced occipital fracture. Screening labs were also completed that were significant for ethanol level 190, mildly elevated LFTs (AST 80, ALT 70). No leukocytosis. Lipase negative. Although patient denies underlying cardiac disease, CP prior to fall delta troponin wnl. No findings concerning for acute ischemia on EKG. Neurosurgery consulted and has evaluated patient at bedside and no acute surgical intervention needed at this time. Will admit patient for repeat neuro tests and repeat CTH.  Based on the above findings, I believe patient patient requires admission for continued management in setting of [facility] and AMS. Patient admitted.    Final Clinical Impression(s) / ED Diagnoses Final diagnoses:  Fall, initial encounter  SAH (subarachnoid hemorrhage) ([place])  Closed fracture of occipital bone, unspecified laterality, unspecified occipital fracture type, initial encounter [facility])    Rx / [place] Orders ED Discharge Orders    None       Kennyth Lose, MD 11/04/19 0040    Lacretia Leigh,  MD 11/04/19 1703

## 2019-11-03 NOTE — ED Notes (Signed)
[phone] teresa please call for updates

## 2019-11-03 NOTE — ED Notes (Signed)
Pt to imaging

## 2019-11-03 NOTE — Consult Note (Signed)
Reason for Consult:SAH Referring Physician: Dr. Alric Ran   HPI: William Gibson is a 74 y.o. male with h/o alcohol abuse presents to [facility] ED via EMS for a fall at home. Pt is amnestic to the fall . Patient denies fall earlier today. The history is provided by the patient and his wife. Wife endorses frequent stumbuling, shuffling gait and frequent falls that she relates to his ETOH abuse  Wife endorses witnessed fall by neighbor from standing. Patient fell backwards onto concrete driveway. Endorses prolonged LOC ~10 min. Patient currently reporting a headache that is a 2-3/10 pain. He has no other complaints at this time.   Past Medical History:  Diagnosis Date  . Abnormal liver function tests 12/15/2018  . Alcoholic liver disease ([place]) 12/15/2018  . Alcoholism ([place])   . Elevated LDL cholesterol level 05/22/2013  . Epistaxis   . Erectile dysfunction 05/22/2013  . Frequent falls 11/09/2018  . GERD (gastroesophageal reflux disease)   . Hyperammonemia ([place]) 11/15/2018  . Memory change 08/17/2017  . RA (rheumatoid arthritis) ([place]) 08/17/2017  . RBBB 12/15/2018  . Rheumatoid arthritis ([place]) 2010  . Shuffling gait 12/19/2018  . Tobacco abuse 04/26/2012  . Weight loss, unintentional 11/09/2018  . 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 12/15/2018    Past Surgical History:  Procedure Laterality Date  . NASAL ENDOSCOPY WITH EPISTAXIS CONTROL N/A 04/12/2018   Procedure: NASAL ENDOSCOPY WITH EPISTAXIS CONTROL;  Surgeon: Melissa Montane, MD;  Location: [facility] ORS;  Service: ENT;  Laterality: N/A;  . VASECTOMY    . VASECTOMY REVERSAL      Family History  Problem Relation Age of Onset  . Lung cancer Father   . Heart disease Mother   . Colon cancer Neg Hx   . Esophageal cancer Neg Hx   . Stomach cancer Neg Hx     Social History:  reports that he has been smoking cigarettes. He has a 87.00 pack-year smoking history. He has never used smokeless tobacco. He reports current alcohol use. He reports that he  does not use drugs.  Allergies: No Known Allergies  Medications: I have reviewed the patient's current medications.  Results for orders placed or performed during the hospital encounter of 11/03/19 (from the past 48 hour(s))  CBC     Status: Abnormal   Collection Time: 11/03/19  8:59 PM  Result Value Ref Range   WBC 8.1 4.0 - 10.5 K/uL   RBC 4.13 (L) 4.22 - 5.81 MIL/uL   Hemoglobin 13.7 13.0 - 17.0 g/dL   HCT 41.2 39 - 52 %   MCV 99.8 80.0 - 100.0 fL   MCH 33.2 26.0 - 34.0 pg   MCHC 33.3 30.0 - 36.0 g/dL   RDW 12.6 11.5 - 15.5 %   Platelets 219 150 - 400 K/uL   nRBC 0.0 0.0 - 0.2 %    Comment: Performed at [facility], [place] [address] [place], [place] [address]  Comprehensive metabolic panel     Status: Abnormal   Collection Time: 11/03/19  8:59 PM  Result Value Ref Range   Sodium 136 135 - 145 mmol/L   Potassium 3.9 3.5 - 5.1 mmol/L   Chloride 103 98 - 111 mmol/L   CO2 21 (L) 22 - 32 mmol/L   Glucose, Bld 120 (H) 70 - 99 mg/dL    Comment: Glucose reference range applies only to samples taken after fasting for at least 8 hours.   BUN 7 (L) 8 - 23 mg/dL  Creatinine, Ser 1.01 0.61 - 1.24 mg/dL   Calcium 8.9 8.9 - 10.3 mg/dL   Total Protein 7.1 6.5 - 8.1 g/dL   Albumin 3.2 (L) 3.5 - 5.0 g/dL   AST 80 (H) 15 - 41 U/L   ALT 70 (H) 0 - 44 U/L   Alkaline Phosphatase 67 38 - 126 U/L   Total Bilirubin 0.4 0.3 - 1.2 mg/dL   GFR calc non Af Amer >60 >60 mL/min   GFR calc Af Amer >60 >60 mL/min   Anion gap 12 5 - 15    Comment: Performed at [facility] [address] [place], [place] [address]  Lipase, blood     Status: None   Collection Time: 11/03/19  8:59 PM  Result Value Ref Range   Lipase 43 11 - 51 U/L    Comment: Performed at [facility] [address], [place], [place] [address]  Ethanol     Status: Abnormal   Collection Time: 11/03/19  8:59 PM  Result Value Ref Range   Alcohol, Ethyl (B) 190 (H) <10 mg/dL    Comment: (NOTE) Lowest detectable  limit for serum alcohol is 10 mg/dL.  For medical purposes only. Performed at [facility], [place] [address] [place], [place] [address]   Troponin I (High Sensitivity)     Status: None   Collection Time: 11/03/19  8:59 PM  Result Value Ref Range   Troponin I (High Sensitivity) 6 <18 ng/L    Comment: (NOTE) Elevated high sensitivity troponin I (hsTnI) values and significant  changes across serial measurements may suggest ACS but many other  chronic and acute conditions are known to elevate hsTnI results.  Refer to the "Links" section for chest pain algorithms and additional  guidance. Performed at [facility], [place] [address], [place], [place] [address]     CT Head Wo Contrast  Addendum Date: 11/03/2019   ADDENDUM REPORT: 11/03/2019 21:38 ADDENDUM: Upon further evaluation, an acute nondisplaced fracture is seen involving the occipital region of the skull, to the right of midline. This extends inferiorly along the skull base to the level of the foramen magnum. Electronically Signed   By: Virgina Norfolk M.D.   On: 11/03/2019 21:38   Result Date: 11/03/2019 CLINICAL DATA:  Status post fall. EXAM: CT HEAD WITHOUT CONTRAST TECHNIQUE: Contiguous axial images were obtained from the base of the skull through the vertex without intravenous contrast. COMPARISON:  November 09, 2018 FINDINGS: Brain: There is mild cerebral atrophy with widening of the extra-axial spaces and ventricular dilatation. There are areas of decreased attenuation within the white matter tracts of the supratentorial brain, consistent with microvascular disease changes. A mild-to-moderate amount of subarachnoid blood is seen along the tentorium cerebelli, bilaterally, as well as along the anterior aspect of the right temporal lobe and left frontal lobe. Involvement of the right temporoparietal region is also seen. There is no evidence of associated mass effect or midline shift. Vascular: No hyperdense vessel or unexpected  calcification. Skull: Normal. Negative for fracture or focal lesion. Sinuses/Orbits: No acute finding. Other: Tiny metallic density foci are seen adjacent to the outer table of the skull along the right frontal region. These are present on the prior study. IMPRESSION: 1. Mild-to-moderate amount of subarachnoid blood along the tentorium cerebelli, bilaterally, as well as along the right temporoparietal region, anterior aspect of the right temporal lobe and left frontal lobe. MRI correlation is recommended. 2. No evidence of associated mass  effect or midline shift. Electronically Signed: By: Virgina Norfolk M.D. On: 11/03/2019 21:27   CT Cervical Spine Wo Contrast  Result Date: 11/03/2019 CLINICAL DATA:  Status post fall. EXAM: CT CERVICAL SPINE WITHOUT CONTRAST TECHNIQUE: Multidetector CT imaging of the cervical spine was performed without intravenous contrast. Multiplanar CT image reconstructions were also generated. COMPARISON:  None. FINDINGS: Alignment: Normal. Skull base and vertebrae: No acute fracture involving the cervical spine. An acute nondisplaced fracture is seen involving the occipital region of the skull, to the right of midline. This extends inferiorly along the skull base to the level of the foramen magnum. Soft tissues and spinal canal: No prevertebral fluid or swelling. No visible canal hematoma. Disc levels: There is marked severity narrowing of the anterior atlanto axial articulation. Mild anterior osteophyte formation is seen at the levels of C3-C4, C4-C5, C5-C6 and C6-C7. Normal multilevel intervertebral disc spaces are seen. Mild bilateral multilevel facet joint hypertrophy is seen. Upper chest: Mild to moderate severity chronic appearing increased lung markings are present. Other: None. IMPRESSION: 1. Acute nondisplaced fracture involving the occipital region of the skull, to the right of midline. This extends inferiorly along the skull base to the level of the foramen magnum. 2. No  acute fracture involving the cervical spine. Electronically Signed   By: Virgina Norfolk M.D.   On: 11/03/2019 21:38   DG Chest Portable 1 View  Result Date: 11/03/2019 CLINICAL DATA:  [place], syncope, dizziness, alcohol abuse EXAM: PORTABLE CHEST 1 VIEW COMPARISON:  09/26/2014 FINDINGS: Single frontal view of the chest demonstrates an unremarkable cardiac silhouette. No airspace disease, effusion, or pneumothorax. No acute bony abnormalities. IMPRESSION: 1. No acute intrathoracic process. Electronically Signed   By: Randa Ngo M.D.   On: 11/03/2019 21:42    Review of Systems - Per HPI   Blood pressure (!) 143/89, pulse 85, temperature 98.3 F (36.8 C), temperature source Oral, resp. rate (!) 22, height 5\' 10"  (1.778 m), weight 74.8 kg, SpO2 96 %. Physical Exam Neurological:     General: No focal deficit present.     Mental Status: He is alert.     GCS: GCS eye subscore is 4. GCS verbal subscore is 4. GCS motor subscore is 6.     Cranial Nerves: Cranial nerves are intact. No dysarthria.     Sensory: Sensation is intact.     Motor: Motor function is intact.     Coordination: Finger-Nose-Finger Test and Heel to [employer] normal.    He is alert and oriented to self but disoriented to time, place, and situation. He follows commands .     Assessment/Plan: The patient has a mild SAH along the tentorium cerebelli, bilaterally, as well as along the right temporoparietal region, anterior aspect of the right temporal lobe and left frontal lobe. There is no evidence of associated mass effect or midline shift. The patients SAH is mild and does not currently need any neurosurgical intervention at this time. He has the potential for neurological deterioration and will be admitted to the floor for observation. Follow-up CT head in the am. Consult to [facility] for medical management and potential ETOH withdrawal.     Peggyann Shoals, MD 11/03/2019, 11:50 PM

## 2019-11-03 NOTE — ED Notes (Addendum)
Wife arrived, states "he is confused."  Wife reports multiple falls in the past few days including twice today.  He fell backwards and hit his head on the cement.  He is able to communicate w/ wife, is disoriented to situation and time.  Provider bedside updating family

## 2019-11-03 NOTE — ED Triage Notes (Signed)
Per EMS, pt from home alone, had a fall (possible syncople episode.)  Pt states he was "dizzy".  Daily drinker, ETOH today.  Reports fatigue past couple of days.  164/90 98% RA CBG 117  Pt made several rude comments about wanting a kiss, RN redirected pt.

## 2019-11-03 NOTE — ED Provider Notes (Signed)
I saw and evaluated the patient, reviewed the resident's note and I agree with the findings and plan.  EKG: EKG Interpretation  Date/Time:  Friday November 03 2019 20:36:40 EDT Ventricular Rate:  83 PR Interval:    QRS Duration: 145 QT Interval:  410 QTC Calculation: 482 R Axis:   103 Text Interpretation: Sinus rhythm Atrial premature complex Consider left atrial enlargement RBBB and LPFB No significant change since last tracing Confirmed by Lacretia Leigh ([id number]) on 11/03/2019 9:9:2 PM   74 year old male here after syncopal event.  Patient is alcohol this evening states is daily drinker.  He has no gross focal neurological deficits patient to have imaging and blood work.   Lacretia Leigh, MD 11/03/19 2125

## 2019-11-04 ENCOUNTER — Observation Stay (HOSPITAL_COMMUNITY): Payer: 59

## 2019-11-04 ENCOUNTER — Encounter (HOSPITAL_COMMUNITY): Payer: Self-pay | Admitting: Family Medicine

## 2019-11-04 DIAGNOSIS — S0990XA Unspecified injury of head, initial encounter: Secondary | ICD-10-CM

## 2019-11-04 DIAGNOSIS — I609 Nontraumatic subarachnoid hemorrhage, unspecified: Secondary | ICD-10-CM | POA: Diagnosis not present

## 2019-11-04 DIAGNOSIS — I6200 Nontraumatic subdural hemorrhage, unspecified: Secondary | ICD-10-CM | POA: Diagnosis not present

## 2019-11-04 DIAGNOSIS — Z720 Tobacco use: Secondary | ICD-10-CM | POA: Diagnosis not present

## 2019-11-04 DIAGNOSIS — Z8679 Personal history of other diseases of the circulatory system: Secondary | ICD-10-CM

## 2019-11-04 DIAGNOSIS — S066X1A Traumatic subarachnoid hemorrhage with loss of consciousness of 30 minutes or less, initial encounter: Secondary | ICD-10-CM | POA: Diagnosis not present

## 2019-11-04 DIAGNOSIS — W19XXXA Unspecified fall, initial encounter: Secondary | ICD-10-CM | POA: Diagnosis not present

## 2019-11-04 DIAGNOSIS — S0291XA Unspecified fracture of skull, initial encounter for closed fracture: Secondary | ICD-10-CM | POA: Diagnosis not present

## 2019-11-04 DIAGNOSIS — S066X0A Traumatic subarachnoid hemorrhage without loss of consciousness, initial encounter: Secondary | ICD-10-CM | POA: Diagnosis not present

## 2019-11-04 DIAGNOSIS — F1020 Alcohol dependence, uncomplicated: Secondary | ICD-10-CM | POA: Diagnosis not present

## 2019-11-04 DIAGNOSIS — S065X0A Traumatic subdural hemorrhage without loss of consciousness, initial encounter: Secondary | ICD-10-CM | POA: Diagnosis not present

## 2019-11-04 DIAGNOSIS — R4020 Unspecified coma: Secondary | ICD-10-CM

## 2019-11-04 LAB — URINALYSIS, ROUTINE W REFLEX MICROSCOPIC
Bilirubin Urine: NEGATIVE
Glucose, UA: NEGATIVE mg/dL
Hgb urine dipstick: NEGATIVE
Ketones, ur: NEGATIVE mg/dL
Leukocytes,Ua: NEGATIVE
Nitrite: NEGATIVE
Protein, ur: NEGATIVE mg/dL
Specific Gravity, Urine: 1.013 (ref 1.005–1.030)
pH: 7 (ref 5.0–8.0)

## 2019-11-04 LAB — RAPID URINE DRUG SCREEN, HOSP PERFORMED
Amphetamines: NOT DETECTED
Barbiturates: NOT DETECTED
Benzodiazepines: POSITIVE — AB
Cocaine: NOT DETECTED
Opiates: NOT DETECTED
Tetrahydrocannabinol: NOT DETECTED

## 2019-11-04 LAB — PHOSPHORUS: Phosphorus: 3.4 mg/dL (ref 2.5–4.6)

## 2019-11-04 LAB — COMPREHENSIVE METABOLIC PANEL
ALT: 66 U/L — ABNORMAL HIGH (ref 0–44)
AST: 69 U/L — ABNORMAL HIGH (ref 15–41)
Albumin: 3.3 g/dL — ABNORMAL LOW (ref 3.5–5.0)
Alkaline Phosphatase: 66 U/L (ref 38–126)
Anion gap: 12 (ref 5–15)
BUN: 6 mg/dL — ABNORMAL LOW (ref 8–23)
CO2: 20 mmol/L — ABNORMAL LOW (ref 22–32)
Calcium: 8.8 mg/dL — ABNORMAL LOW (ref 8.9–10.3)
Chloride: 103 mmol/L (ref 98–111)
Creatinine, Ser: 0.83 mg/dL (ref 0.61–1.24)
GFR calc Af Amer: >60 mL/min (ref >60)
GFR calc non Af Amer: >60 mL/min (ref >60)
Glucose, Bld: 129 mg/dL — ABNORMAL HIGH (ref 70–99)
Potassium: 4.4 mmol/L (ref 3.5–5.1)
Sodium: 135 mmol/L (ref 135–145)
Total Bilirubin: 0.8 mg/dL (ref 0.3–1.2)
Total Protein: 7.2 g/dL (ref 6.5–8.1)

## 2019-11-04 LAB — CBC
HCT: 42.3 % (ref 39.0–52.0)
Hemoglobin: 14.3 g/dL (ref 13.0–17.0)
MCH: 33.3 pg (ref 26.0–34.0)
MCHC: 33.8 g/dL (ref 30.0–36.0)
MCV: 98.6 fL (ref 80.0–100.0)
Platelets: 229 10*3/uL (ref 150–400)
RBC: 4.29 MIL/uL (ref 4.22–5.81)
RDW: 12.5 % (ref 11.5–15.5)
WBC: 9.2 10*3/uL (ref 4.0–10.5)
nRBC: 0 % (ref 0.0–0.2)

## 2019-11-04 LAB — MRSA PCR SCREENING: MRSA by PCR: NEGATIVE

## 2019-11-04 LAB — MAGNESIUM: Magnesium: 1.6 mg/dL — ABNORMAL LOW (ref 1.7–2.4)

## 2019-11-04 LAB — SARS CORONAVIRUS 2 BY RT PCR (HOSPITAL ORDER, PERFORMED IN ~~LOC~~ HOSPITAL LAB): SARS Coronavirus 2: NEGATIVE

## 2019-11-04 LAB — TROPONIN I (HIGH SENSITIVITY): Troponin I (High Sensitivity): 8 ng/L (ref <18)

## 2019-11-04 MED ORDER — SODIUM CHLORIDE 0.9% FLUSH
3.0000 mL | Freq: Two times a day (BID) | INTRAVENOUS | Status: DC
  Administered 2019-11-04 – 2019-11-10 (×12): 3 mL via INTRAVENOUS

## 2019-11-04 MED ORDER — IBUPROFEN 400 MG PO TABS
400.0000 mg | ORAL_TABLET | Freq: Four times a day (QID) | ORAL | Status: DC | PRN
  Administered 2019-11-04: 400 mg via ORAL
  Filled 2019-11-04: qty 1

## 2019-11-04 MED ORDER — LORAZEPAM 2 MG/ML IJ SOLN
1.0000 mg | INTRAMUSCULAR | Status: AC | PRN
  Administered 2019-11-05: 3 mg via INTRAVENOUS
  Administered 2019-11-05 (×3): 4 mg via INTRAVENOUS
  Administered 2019-11-06 – 2019-11-07 (×3): 2 mg via INTRAVENOUS
  Filled 2019-11-04 (×2): qty 1
  Filled 2019-11-04 (×3): qty 2
  Filled 2019-11-04: qty 1
  Filled 2019-11-04: qty 2

## 2019-11-04 MED ORDER — ADULT MULTIVITAMIN W/MINERALS CH
1.0000 | ORAL_TABLET | Freq: Every day | ORAL | Status: DC
  Administered 2019-11-04 – 2019-11-10 (×7): 1 via ORAL
  Filled 2019-11-04 (×7): qty 1

## 2019-11-04 MED ORDER — CHLORDIAZEPOXIDE HCL 25 MG PO CAPS: 25.0000 mg | ORAL_CAPSULE | Freq: Every day | ORAL | Status: DC

## 2019-11-04 MED ORDER — MAGNESIUM SULFATE 2 GM/50ML IV SOLN
2.0000 g | Freq: Once | INTRAVENOUS | Status: AC
  Administered 2019-11-04: 2 g via INTRAVENOUS
  Filled 2019-11-04: qty 50

## 2019-11-04 MED ORDER — CHLORDIAZEPOXIDE HCL 25 MG PO CAPS
25.0000 mg | ORAL_CAPSULE | ORAL | Status: DC
  Administered 2019-11-06: 25 mg via ORAL
  Filled 2019-11-04: qty 1

## 2019-11-04 MED ORDER — PANTOPRAZOLE SODIUM 40 MG PO TBEC
40.0000 mg | DELAYED_RELEASE_TABLET | Freq: Every day | ORAL | Status: DC
  Administered 2019-11-04 – 2019-11-10 (×7): 40 mg via ORAL
  Filled 2019-11-04 (×7): qty 1

## 2019-11-04 MED ORDER — LORAZEPAM 1 MG PO TABS
1.0000 mg | ORAL_TABLET | ORAL | Status: AC | PRN
  Administered 2019-11-04 – 2019-11-05 (×5): 1 mg via ORAL
  Administered 2019-11-05: 2 mg via ORAL
  Filled 2019-11-04: qty 1
  Filled 2019-11-04: qty 2
  Filled 2019-11-04: qty 1
  Filled 2019-11-04: qty 2
  Filled 2019-11-04 (×3): qty 1

## 2019-11-04 MED ORDER — FOLIC ACID 1 MG PO TABS
1.0000 mg | ORAL_TABLET | Freq: Every day | ORAL | Status: DC
  Administered 2019-11-04 – 2019-11-07 (×4): 1 mg via ORAL
  Filled 2019-11-04 (×4): qty 1

## 2019-11-04 MED ORDER — CHLORDIAZEPOXIDE HCL 10 MG PO CAPS: 10.0000 mg | ORAL_CAPSULE | Freq: Three times a day (TID) | ORAL | Status: DC

## 2019-11-04 MED ORDER — THIAMINE HCL 100 MG PO TABS: 100.0000 mg | ORAL_TABLET | Freq: Every day | ORAL | Status: DC

## 2019-11-04 MED ORDER — THIAMINE HCL 100 MG/ML IJ SOLN
100.0000 mg | Freq: Every day | INTRAMUSCULAR | Status: DC
  Administered 2019-11-08 – 2019-11-10 (×3): 100 mg via INTRAVENOUS
  Filled 2019-11-04 (×3): qty 2

## 2019-11-04 MED ORDER — THIAMINE HCL 100 MG PO TABS
100.0000 mg | ORAL_TABLET | Freq: Every day | ORAL | Status: DC
  Administered 2019-11-04 – 2019-11-07 (×4): 100 mg via ORAL
  Filled 2019-11-04 (×5): qty 1

## 2019-11-04 MED ORDER — CHLORDIAZEPOXIDE HCL 25 MG PO CAPS
25.0000 mg | ORAL_CAPSULE | Freq: Three times a day (TID) | ORAL | Status: DC
  Administered 2019-11-05 (×2): 25 mg via ORAL
  Filled 2019-11-04 (×2): qty 1

## 2019-11-04 MED ORDER — KCL IN DEXTROSE-NACL 20-5-0.45 MEQ/L-%-% IV SOLN
INTRAVENOUS | Status: DC
  Filled 2019-11-04 (×3): qty 1000

## 2019-11-04 MED ORDER — CHLORDIAZEPOXIDE HCL 25 MG PO CAPS
25.0000 mg | ORAL_CAPSULE | Freq: Four times a day (QID) | ORAL | Status: AC
  Administered 2019-11-04 (×4): 25 mg via ORAL
  Filled 2019-11-04 (×4): qty 1

## 2019-11-04 MED ORDER — OXYCODONE HCL 5 MG PO TABS
5.0000 mg | ORAL_TABLET | Freq: Three times a day (TID) | ORAL | Status: DC | PRN
  Administered 2019-11-04 – 2019-11-08 (×5): 5 mg via ORAL
  Filled 2019-11-04 (×6): qty 1

## 2019-11-04 MED ORDER — FOLIC ACID 1 MG PO TABS: 1.0000 mg | ORAL_TABLET | Freq: Every day | ORAL | Status: DC

## 2019-11-04 MED ORDER — NICOTINE 21 MG/24HR TD PT24
21.0000 mg | MEDICATED_PATCH | Freq: Every day | TRANSDERMAL | Status: DC
  Administered 2019-11-04 – 2019-11-10 (×7): 21 mg via TRANSDERMAL
  Filled 2019-11-04 (×7): qty 1

## 2019-11-04 NOTE — ED Notes (Signed)
Pt appears very intoxicated.  He is alert enough to make rude comments to RN and will follow commands however when RN attempts to complete a NIH he is not cooperative.  Refused to move any limbs on command but is able to move them freely.  When asked his name he gave "joked" and gave the wrong name but later clarified.  Pt denies any pain.

## 2019-11-04 NOTE — Progress Notes (Signed)
Pt arrived on floor, transferred from ED. Patient is A&OX4 and in no acute distress and is "tired". Pt only complaint of pain is a headache and light sensitivity. Pt inquiring about discharge and "when can [he] go smoke?" MD to order nicotine patch. PERRLA, mucous membranes pink, moist with no signs of breakdown noted. No skin issues or breakdown noted upon assessment.  SCD's placed on patient lower extremieties & turned on. Patient orienated to room and unit. Bedside table and personal belongings within reach of patient. Patient educated on usage of nurse call bell, placed within reach of patient. Patient instructed to call for assistance. Patient in bed. Will continue to monitor.  Report received from [place], ED RN.

## 2019-11-04 NOTE — ED Notes (Signed)
Lunch Tray Ordered @ 1039. 

## 2019-11-04 NOTE — Consult Note (Signed)
Reason for Consult:  Medical management of alcoholism.  Referring Physician: Neurosurgey  William Gibson is an 74 y.o. male.  HPI: William Gibson is a 74 year old white male with a history of alcoholism, rheumatoid arthritis, alcoholic liver disease, tobacco use, previous CVA.  His wife is at bedside provides history.  Reportedly patient was at home doing some work outside when he fell and hit the back of his head on the concrete driveway.  He had loss of consciousness that was prolonged at approximately 10 minutes.  Wife states he did not have any vomiting during this time.  Patient is currently asleep but he will wake up to voice.  He has no memory of the event and cannot provide any history.  Wife reports that he drinks up to 1/5 of whiskey most days of the week.  She states that he has had tremors and been agitated in the past when he goes without alcohol for a few days.  She does not think he has ever been in DTs.  He has frequent falls at home especially has been drinking.  She also reports he has unsteady gait some of the time when he is sober he has no trouble walking.  In the emergency room patient was found to have a subarachnoid hemorrhage on CT of his head.  He also has a basilar skull fracture in the occipital region. Been seen by neurosurgery who is going to admit patient to their service and asked for hospitalist consult to help manage alcohol withdrawal.  Past Medical History:  Diagnosis Date  . Abnormal liver function tests 12/15/2018  . Alcoholic liver disease ([place]) 12/15/2018  . Alcoholism ([place])   . Elevated LDL cholesterol level 05/22/2013  . Epistaxis   . Erectile dysfunction 05/22/2013  . Frequent falls 11/09/2018  . GERD (gastroesophageal reflux disease)   . Hyperammonemia ([place]) 11/15/2018  . Memory change 08/17/2017  . RA (rheumatoid arthritis) ([place]) 08/17/2017  . RBBB 12/15/2018  . Rheumatoid arthritis ([place]) 2010  . Shuffling gait 12/19/2018  . Tobacco abuse 04/26/2012  . Weight  loss, unintentional 11/09/2018  . 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 12/15/2018    Past Surgical History:  Procedure Laterality Date  . NASAL ENDOSCOPY WITH EPISTAXIS CONTROL N/A 04/12/2018   Procedure: NASAL ENDOSCOPY WITH EPISTAXIS CONTROL;  Surgeon: Melissa Montane, MD;  Location: [facility];  Service: ENT;  Laterality: N/A;  . VASECTOMY    . VASECTOMY REVERSAL      Family History  Problem Relation Age of Onset  . Lung cancer Father   . Heart disease Mother   . Colon cancer Neg Hx   . Esophageal cancer Neg Hx   . Stomach cancer Neg Hx     Social History:  reports that he has been smoking cigarettes. He has a 87.00 pack-year smoking history. He has never used smokeless tobacco. He reports current alcohol use. He reports that he does not use drugs.  Allergies: No Known Allergies  Medications: Cyanocobalamin (VITAMIN B-12 PO) Take 1 tablet by mouth daily.   Yes [provider]  meloxicam (MOBIC) 15 MG tablet Take 15 mg by mouth daily. 07/18/15  Yes [provider]  omeprazole (PRILOSEC OTC) 20 MG tablet Take 20 mg by mouth daily.   Yes [provider]  thiamine (VITAMIN B-1) 100 MG tablet Take 1 tablet (100 mg total) by mouth daily. 11/16/18  Yes Lucille Passy, MD  chlordiazePOXIDE (LIBRIUM) 25 MG capsule 50mg  PO TID x 1D, then  25-50mg  PO BID X 1D, then 25-50mg  PO QD X 1D Patient not taking: Reported on 11/03/2019 10/23/19   Dorie Rank, MD  Cobalamin Combinations (VITAMIN B12-FOLIC ACID) 956-387 MCG TABS 1 tab by mouth daily. Patient not taking: Reported on 11/03/2019 11/16/18   Lucille Passy, MD     Results for orders placed or performed during the hospital encounter of 11/03/19 (from the past 48 hour(s))  CBC     Status: Abnormal   Collection Time: 11/03/19  8:59 PM  Result Value Ref Range   WBC 8.1 4.0 - 10.5 K/uL   RBC 4.13 (L) 4.22 - 5.81 MIL/uL   Hemoglobin 13.7 13.0 - 17.0 g/dL   HCT 41.2 39 - 52 %   MCV 99.8 80.0 - 100.0 fL   MCH  33.2 26.0 - 34.0 pg   MCHC 33.3 30.0 - 36.0 g/dL   RDW 12.6 11.5 - 15.5 %   Platelets 219 150 - 400 K/uL   nRBC 0.0 0.0 - 0.2 %    Comment: Performed at [facility], [place] [address], [place], [place] [address]  Comprehensive metabolic panel     Status: Abnormal   Collection Time: 11/03/19  8:59 PM  Result Value Ref Range   Sodium 136 135 - 145 mmol/L   Potassium 3.9 3.5 - 5.1 mmol/L   Chloride 103 98 - 111 mmol/L   CO2 21 (L) 22 - 32 mmol/L   Glucose, Bld 120 (H) 70 - 99 mg/dL    Comment: Glucose reference range applies only to samples taken after fasting for at least 8 hours.   BUN 7 (L) 8 - 23 mg/dL   Creatinine, Ser 1.01 0.61 - 1.24 mg/dL   Calcium 8.9 8.9 - 10.3 mg/dL   Total Protein 7.1 6.5 - 8.1 g/dL   Albumin 3.2 (L) 3.5 - 5.0 g/dL   AST 80 (H) 15 - 41 U/L   ALT 70 (H) 0 - 44 U/L   Alkaline Phosphatase 67 38 - 126 U/L   Total Bilirubin 0.4 0.3 - 1.2 mg/dL   GFR calc non Af Amer >60 >60 mL/min   GFR calc Af Amer >60 >60 mL/min   Anion gap 12 5 - 15    Comment: Performed at [facility] [address] [place], [place] [address]  Lipase, blood     Status: None   Collection Time: 11/03/19  8:59 PM  Result Value Ref Range   Lipase 43 11 - 51 U/L    Comment: Performed at [facility] [address], [place], [place] [address]  Ethanol     Status: Abnormal   Collection Time: 11/03/19  8:59 PM  Result Value Ref Range   Alcohol, Ethyl (B) 190 (H) <10 mg/dL    Comment: (NOTE) Lowest detectable limit for serum alcohol is 10 mg/dL.  For medical purposes only. Performed at [facility], [place] [address], [place], [place] [address]   Troponin I (High Sensitivity)     Status: None   Collection Time: 11/03/19  8:59 PM  Result Value Ref Range   Troponin I (High Sensitivity) 6 <18 ng/L    Comment: (NOTE) Elevated high sensitivity troponin I (hsTnI) values and significant  changes across serial measurements may suggest ACS but many other  chronic and  acute conditions are known to elevate hsTnI results.  Refer to the "Links" section for chest pain algorithms and additional  guidance. Performed at [facility],  [address] [place], [place] [address]   SARS Coronavirus 2 by RT PCR (hospital order, performed in [facility] hospital lab) Nasopharyngeal Nasopharyngeal Swab     Status: None   Collection Time: 11/03/19 10:38 PM   Specimen: Nasopharyngeal Swab  Result Value Ref Range   SARS Coronavirus 2 NEGATIVE NEGATIVE    Comment: (NOTE) SARS-CoV-2 target nucleic acids are NOT DETECTED.  The SARS-CoV-2 RNA is generally detectable in upper and lower respiratory specimens during the acute phase of infection. The lowest concentration of SARS-CoV-2 viral copies this assay can detect is 250 copies / mL. A negative result does not preclude SARS-CoV-2 infection and should not be used as the sole basis for treatment or other patient management decisions.  A negative result may occur with improper specimen collection / handling, submission of specimen other than nasopharyngeal swab, presence of viral mutation(s) within the areas targeted by this assay, and inadequate number of viral copies (<250 copies / mL). A negative result must be combined with clinical observations, patient history, and epidemiological information.  Fact Sheet for Patients:   StrictlyIdeas.no  Fact Sheet for Healthcare Providers: BankingDealers.co.za  This test is not yet approved or  cleared by the [place] FDA and has been authorized for detection and/or diagnosis of SARS-CoV-2 by FDA under an Emergency Use Authorization (EUA).  This EUA will remain in effect (meaning this test can be used) for the duration of the COVID-19 declaration under Section 564(b)(1) of the Act, 21 U.S.C. section 360bbb-3(b)(1), unless the authorization is terminated or revoked sooner.  Performed at [facility], [place] [address] [place], [place] [address]   Troponin I (High Sensitivity)     Status: None   Collection Time: 11/03/19 11:25 PM  Result Value Ref Range   Troponin I (High Sensitivity) 8 <18 ng/L    Comment: (NOTE) Elevated high sensitivity troponin I (hsTnI) values and significant  changes across serial measurements may suggest ACS but many other  chronic and acute conditions are known to elevate hsTnI results.  Refer to the "Links" section for chest pain algorithms and additional  guidance. Performed at [facility], [place] [address] [place],  [address]     CT Head Wo Contrast  Addendum Date: 11/03/2019   ADDENDUM REPORT: 11/03/2019 21:38 ADDENDUM: Upon further evaluation, an acute nondisplaced fracture is seen involving the occipital region of the skull, to the right of midline. This extends inferiorly along the skull base to the level of the foramen magnum. Electronically Signed   By: Virgina Norfolk M.D.   On: 11/03/2019 21:38   Result Date: 11/03/2019 CLINICAL DATA:  Status post fall. EXAM: CT HEAD WITHOUT CONTRAST TECHNIQUE: Contiguous axial images were obtained from the base of the skull through the vertex without intravenous contrast. COMPARISON:  November 09, 2018 FINDINGS: Brain: There is mild cerebral atrophy with widening of the extra-axial spaces and ventricular dilatation. There are areas of decreased attenuation within the white matter tracts of the supratentorial brain, consistent with microvascular disease changes. A mild-to-moderate amount of subarachnoid blood is seen along the tentorium cerebelli, bilaterally, as well as along the anterior aspect of the right temporal lobe and left frontal lobe. Involvement of the right temporoparietal region is also seen. There is no evidence of associated mass effect or midline shift. Vascular: No hyperdense vessel or unexpected calcification. Skull: Normal. Negative for fracture or focal lesion. Sinuses/Orbits: No acute finding. Other:  Tiny metallic density foci are seen adjacent to the  outer table of the skull along the right frontal region. These are present on the prior study. IMPRESSION: 1. Mild-to-moderate amount of subarachnoid blood along the tentorium cerebelli, bilaterally, as well as along the right temporoparietal region, anterior aspect of the right temporal lobe and left frontal lobe. MRI correlation is recommended. 2. No evidence of associated mass effect or midline shift. Electronically Signed: By: Virgina Norfolk M.D. On: 11/03/2019 21:27   CT Cervical Spine Wo Contrast  Result Date: 11/03/2019 CLINICAL DATA:  Status post fall. EXAM: CT CERVICAL SPINE WITHOUT CONTRAST TECHNIQUE: Multidetector CT imaging of the cervical spine was performed without intravenous contrast. Multiplanar CT image reconstructions were also generated. COMPARISON:  None. FINDINGS: Alignment: Normal. Skull base and vertebrae: No acute fracture involving the cervical spine. An acute nondisplaced fracture is seen involving the occipital region of the skull, to the right of midline. This extends inferiorly along the skull base to the level of the foramen magnum. Soft tissues and spinal canal: No prevertebral fluid or swelling. No visible canal hematoma. Disc levels: There is marked severity narrowing of the anterior atlanto axial articulation. Mild anterior osteophyte formation is seen at the levels of C3-C4, C4-C5, C5-C6 and C6-C7. Normal multilevel intervertebral disc spaces are seen. Mild bilateral multilevel facet joint hypertrophy is seen. Upper chest: Mild to moderate severity chronic appearing increased lung markings are present. Other: None. IMPRESSION: 1. Acute nondisplaced fracture involving the occipital region of the skull, to the right of midline. This extends inferiorly along the skull base to the level of the foramen magnum. 2. No acute fracture involving the cervical spine. Electronically Signed   By: Virgina Norfolk M.D.   On: 11/03/2019  21:38   DG Chest Portable 1 View  Result Date: 11/03/2019 CLINICAL DATA:  [place], syncope, dizziness, alcohol abuse EXAM: PORTABLE CHEST 1 VIEW COMPARISON:  09/26/2014 FINDINGS: Single frontal view of the chest demonstrates an unremarkable cardiac silhouette. No airspace disease, effusion, or pneumothorax. No acute bony abnormalities. IMPRESSION: 1. No acute intrathoracic process. Electronically Signed   By: Randa Ngo M.D.   On: 11/03/2019 21:42    Review of Systems  Patient is unable to provide a complete review of system. He denies chest pain, shortness of breath, visual change, abdominal pain, nausea, vomiting, diarrhea, urinary frequency or dysuria. Does report mild headache that he states is dull in the back of his head.  Blood pressure (!) 145/89, pulse 88, temperature 98.3 F (36.8 C), temperature source Oral, resp. rate (!) 23, height 5\' 10"  (1.778 m), weight 74.8 kg, SpO2 94 %. Physical Exam General:  WDWN, patient in cervical neck collar.  Patient is sleeping but will briefly awaken to voice. Eyes: EOMI, PERRL, lids and conjunctivae normal.  Sclera nonicteric HENT:  , external ears normal.  Nares patent without epistasis.  Mucous membranes are moist. Posterior pharynx clear of any exudate or lesions Neck: Soft, normal range of motion, supple, no masses, no thyromegaly.  Trachea midline Respiratory: Equal breath sounds.  Diffuse scattered Rales.  Mild expiratory wheezing.    No rhonchi or retractions. No crackles. Normal respiratory effort. No accessory muscle use.  Cardiovascular: Regular rate and rhythm, no murmurs / rubs / gallops.    No pedal edema.. 2+ pedal pulses.   Abdomen: Soft, no tenderness, nondistended, no rebound or guarding.  No masses palpated. No hepatosplenomegaly. Bowel sounds normoactive Musculoskeletal:  With passive range of motion.  Clubbing of digits present.  No cyanosis. No joint deformity upper and lower extremities. Normal muscle  tone.  Skin: Warm, dry,  intact no rashes, lesions, ulcers. No induration Neurologic: CN 2-12 grossly intact.  Sensation intact, patella DTR +1 bilaterally.    Assessment/Plan: 1. Alcoholism Patient will be placed on CIWA protocol.  Ativan will provide as needed for elevated CIWA scores and agitation. We will start Librium 3 times a day to help prevent withdrawals from developing. Patient last drank alcohol on the afternoon of November 03, 2019 shortly before his fall and subsequent head injury  2.  Subarachnoid hemorrhage Neurosurgery is admitting patient and will manage  3.  Skull fracture Neurosurgery is admitting and will manage  Yevonne Aline Varshini Arrants 11/04/2019, 1:44 AM

## 2019-11-04 NOTE — Progress Notes (Signed)
PROGRESS NOTE  William Gibson OVF:[id number] DOB: 11-04-1945 DOA: 11/03/2019 PCP: Patient, No Pcp Per  HPI/Recap of past 24 hours: HPI from Dr Tonie Griffith [name] is a 74 year old white male with a history of alcoholism, rheumatoid arthritis, alcoholic liver disease, tobacco use, previous CVA. Reportedly patient was at home doing some work outside when he fell and hit the back of his head on the concrete driveway.  He had loss of consciousness that was prolonged at approximately 10 minutes. Pt has no memory of the event and could not provide any history. Wife reports that he drinks up to 1/5 of whiskey most days of the week.  She states that he has had tremors and been agitated in the past when he goes without alcohol for a few days.  She does not think he has ever been in DTs.  He has frequent falls at home with unsteady gait. In the emergency room patient was found to have a subarachnoid hemorrhage and basilar skull fracture in the occipital region on CT of his head.  Neurosurgery admitted patient, and [facility] were consulted.  Repeat CT head showed unchanged subarachnoid hemorrhage and neurosurgery had no further recommendation and asked [facility] hospitalist to take over to assist in managing alcohol abuse/withdrawal.    Today, patient reports headache, especially behind his eyes, denies any nausea/vomiting, chest pain, shortness of breath, abdominal pain, nausea/vomiting, fever/chills.  Patient stated he would like to quit drinking after extensive counseling.    Assessment/Plan: Principal Problem:   Alcoholism ([place]) Active Problems:   Tobacco abuse   Subarachnoid hemorrhage ([place])   Skull fracture ([place])   Head injury   Fall at home, initial encounter   Loss of consciousness ([place])   Subarachnoid hemorrhage/basilar skull fracture status post mechanical fall Management by neurosurgery  Alcohol abuse/withdrawal Mild transaminitis Status post IV fluids CIWA protocol Start  Librium detox taper Continue folic acid, thiamine, multivitamin Social worker consult for resources Telemetry  Mechanical fall Likely secondary to above EKG with no acute ST changes, troponin negative PT/OT  Hypomagnesemia Replace as needed  Tobacco abuse Advised to quit Nicotine patch ordered       Malnutrition Type:      Malnutrition Characteristics:      Nutrition Interventions:       Estimated body mass index is 23.68 kg/m as calculated from the following:   Height as of this encounter: 5\' 10"  (1.778 m).   Weight as of this encounter: 74.8 kg.     Code Status: Full  Family Communication: Discussed with daughter at bedside  Disposition Plan: Status is: Observation  The patient remains OBS appropriate and will d/c before 2 midnights.  Dispo: The patient is from: Home              Anticipated d/c is to: Home              Anticipated d/c date is: 1 day              Patient currently is not medically stable to d/c.    Consultants:  Neurosurgery  Procedures:  None  Antimicrobials:  None  DVT prophylaxis: SCDs   Objective: Vitals:   11/04/19 0700 11/04/19 0912 11/04/19 1327 11/04/19 1444  BP: (!) 153/91 (!) 139/96 117/86 136/87  Pulse: 86 92 90 80  Resp: (!) 23 20  (!) 26  Temp:    (!) 97.5 F (36.4 C)  TempSrc:    Oral  SpO2: 95% 95%  96%  Weight:      Height:        Intake/Output Summary (Last 24 hours) at 11/04/2019 1500 Last data filed at 11/04/2019 1112 Gross per 24 hour  Intake 50 ml  Output --  Net 50 ml   Filed Weights   11/03/19 2035  Weight: 74.8 kg    Exam:  General: NAD   Cardiovascular: S1, S2 present  Respiratory: CTAB  Abdomen: Soft, nontender, nondistended, bowel sounds present  Musculoskeletal: No bilateral pedal edema noted  Skin: Normal  Psychiatry: Normal mood  Neurology: No obvious focal neurologic deficits noted    Data Reviewed: CBC: Recent Labs  Lab 11/03/19 2059  11/04/19 0225  WBC 8.1 9.2  HGB 13.7 14.3  HCT 41.2 42.3  MCV 99.8 98.6  PLT 219 979   Basic Metabolic Panel: Recent Labs  Lab 11/03/19 2059 11/04/19 0225  NA 136 135  K 3.9 4.4  CL 103 103  CO2 21* 20*  GLUCOSE 120* 129*  BUN 7* 6*  CREATININE 1.01 0.83  CALCIUM 8.9 8.8*  MG  --  1.6*  PHOS  --  3.4   GFR: Estimated Creatinine Clearance: 80.6 mL/min (by C-G formula based on SCr of 0.83 mg/dL). Liver Function Tests: Recent Labs  Lab 11/03/19 2059 11/04/19 0225  AST 80* 69*  ALT 70* 66*  ALKPHOS 67 66  BILITOT 0.4 0.8  PROT 7.1 7.2  ALBUMIN 3.2* 3.3*   Recent Labs  Lab 11/03/19 2059  LIPASE 43   No results for input(s): AMMONIA in the last 168 hours. Coagulation Profile: No results for input(s): INR, PROTIME in the last 168 hours. Cardiac Enzymes: No results for input(s): CKTOTAL, CKMB, CKMBINDEX, TROPONINI in the last 168 hours. BNP (last 3 results) No results for input(s): PROBNP in the last 8760 hours. HbA1C: No results for input(s): HGBA1C in the last 72 hours. CBG: No results for input(s): GLUCAP in the last 168 hours. Lipid Profile: No results for input(s): CHOL, HDL, LDLCALC, TRIG, CHOLHDL, LDLDIRECT in the last 72 hours. Thyroid Function Tests: No results for input(s): TSH, T4TOTAL, FREET4, T3FREE, THYROIDAB in the last 72 hours. Anemia Panel: No results for input(s): VITAMINB12, FOLATE, FERRITIN, TIBC, IRON, RETICCTPCT in the last 72 hours. Urine analysis:    Component Value Date/Time   COLORURINE YELLOW 11/04/2019 0935   APPEARANCEUR CLEAR 11/04/2019 0935   LABSPEC 1.013 11/04/2019 0935   PHURINE 7.0 11/04/2019 0935   GLUCOSEU NEGATIVE 11/04/2019 0935   GLUCOSEU NEGATIVE 11/10/2018 0917   HGBUR NEGATIVE 11/04/2019 0935   BILIRUBINUR NEGATIVE 11/04/2019 0935   KETONESUR NEGATIVE 11/04/2019 0935   PROTEINUR NEGATIVE 11/04/2019 0935   UROBILINOGEN 0.2 11/10/2018 0917   NITRITE NEGATIVE 11/04/2019 0935   LEUKOCYTESUR NEGATIVE 11/04/2019  0935   Sepsis Labs: @LABRCNTIP (procalcitonin:4,lacticidven:4)  ) Recent Results (from the past 240 hour(s))  SARS Coronavirus 2 by RT PCR (hospital order, performed in [facility] hospital lab) Nasopharyngeal Nasopharyngeal Swab     Status: None   Collection Time: 11/03/19 10:38 PM   Specimen: Nasopharyngeal Swab  Result Value Ref Range Status   SARS Coronavirus 2 NEGATIVE NEGATIVE Final    Comment: (NOTE) SARS-CoV-2 target nucleic acids are NOT DETECTED.  The SARS-CoV-2 RNA is generally detectable in upper and lower respiratory specimens during the acute phase of infection. The lowest concentration of SARS-CoV-2 viral copies this assay can detect is 250 copies / mL. A negative result does not preclude SARS-CoV-2 infection and should not be used as the sole basis for treatment  or other patient management decisions.  A negative result may occur with improper specimen collection / handling, submission of specimen other than nasopharyngeal swab, presence of viral mutation(s) within the areas targeted by this assay, and inadequate number of viral copies (<250 copies / mL). A negative result must be combined with clinical observations, patient history, and epidemiological information.  Fact Sheet for Patients:   StrictlyIdeas.no  Fact Sheet for Healthcare Providers: BankingDealers.co.za  This test is not yet approved or  cleared by the [place] FDA and has been authorized for detection and/or diagnosis of SARS-CoV-2 by FDA under an Emergency Use Authorization (EUA).  This EUA will remain in effect (meaning this test can be used) for the duration of the COVID-19 declaration under Section 564(b)(1) of the Act, 21 U.S.C. section 360bbb-3(b)(1), unless the authorization is terminated or revoked sooner.  Performed at [facility], [place] [address], [place], [place] [address]       Studies: CT HEAD WO CONTRAST  Result Date:  11/04/2019 CLINICAL DATA:  Subarachnoid hemorrhage EXAM: CT HEAD WITHOUT CONTRAST TECHNIQUE: Contiguous axial images were obtained from the base of the skull through the vertex without intravenous contrast. COMPARISON:  Head CT 11/03/2019 FINDINGS: Brain: Unchanged pattern of subarachnoid hemorrhage over both cerebral hemispheres. Right convexity small subdural hemorrhage is also unchanged. No new site of hemorrhage. No midline shift or other mass effect. Vascular: No abnormal hyperdensity of the major intracranial arteries or dural venous sinuses. No intracranial atherosclerosis. Skull: Unchanged appearance of nondisplaced parasagittal occipital fracture. Sinuses/Orbits: No fluid levels or advanced mucosal thickening of the visualized paranasal sinuses. No mastoid or middle ear effusion. The orbits are normal. IMPRESSION: Unchanged pattern of subarachnoid and subdural hemorrhage. Electronically Signed   By: Ulyses Jarred M.D.   On: 11/04/2019 05:32   CT Head Wo Contrast  Addendum Date: 11/03/2019   ADDENDUM REPORT: 11/03/2019 21:38 ADDENDUM: Upon further evaluation, an acute nondisplaced fracture is seen involving the occipital region of the skull, to the right of midline. This extends inferiorly along the skull base to the level of the foramen magnum. Electronically Signed   By: Virgina Norfolk M.D.   On: 11/03/2019 21:38   Result Date: 11/03/2019 CLINICAL DATA:  Status post fall. EXAM: CT HEAD WITHOUT CONTRAST TECHNIQUE: Contiguous axial images were obtained from the base of the skull through the vertex without intravenous contrast. COMPARISON:  November 09, 2018 FINDINGS: Brain: There is mild cerebral atrophy with widening of the extra-axial spaces and ventricular dilatation. There are areas of decreased attenuation within the white matter tracts of the supratentorial brain, consistent with microvascular disease changes. A mild-to-moderate amount of subarachnoid blood is seen along the tentorium cerebelli,  bilaterally, as well as along the anterior aspect of the right temporal lobe and left frontal lobe. Involvement of the right temporoparietal region is also seen. There is no evidence of associated mass effect or midline shift. Vascular: No hyperdense vessel or unexpected calcification. Skull: Normal. Negative for fracture or focal lesion. Sinuses/Orbits: No acute finding. Other: Tiny metallic density foci are seen adjacent to the outer table of the skull along the right frontal region. These are present on the prior study. IMPRESSION: 1. Mild-to-moderate amount of subarachnoid blood along the tentorium cerebelli, bilaterally, as well as along the right temporoparietal region, anterior aspect of the right temporal lobe and left frontal lobe. MRI correlation is recommended. 2. No evidence of associated mass effect or midline shift. Electronically Signed: By: Virgina Norfolk M.D. On: 11/03/2019 21:27  CT Cervical Spine Wo Contrast  Result Date: 11/03/2019 CLINICAL DATA:  Status post fall. EXAM: CT CERVICAL SPINE WITHOUT CONTRAST TECHNIQUE: Multidetector CT imaging of the cervical spine was performed without intravenous contrast. Multiplanar CT image reconstructions were also generated. COMPARISON:  None. FINDINGS: Alignment: Normal. Skull base and vertebrae: No acute fracture involving the cervical spine. An acute nondisplaced fracture is seen involving the occipital region of the skull, to the right of midline. This extends inferiorly along the skull base to the level of the foramen magnum. Soft tissues and spinal canal: No prevertebral fluid or swelling. No visible canal hematoma. Disc levels: There is marked severity narrowing of the anterior atlanto axial articulation. Mild anterior osteophyte formation is seen at the levels of C3-C4, C4-C5, C5-C6 and C6-C7. Normal multilevel intervertebral disc spaces are seen. Mild bilateral multilevel facet joint hypertrophy is seen. Upper chest: Mild to moderate severity  chronic appearing increased lung markings are present. Other: None. IMPRESSION: 1. Acute nondisplaced fracture involving the occipital region of the skull, to the right of midline. This extends inferiorly along the skull base to the level of the foramen magnum. 2. No acute fracture involving the cervical spine. Electronically Signed   By: Virgina Norfolk M.D.   On: 11/03/2019 21:38   DG Chest Portable 1 View  Result Date: 11/03/2019 CLINICAL DATA:  [place], syncope, dizziness, alcohol abuse EXAM: PORTABLE CHEST 1 VIEW COMPARISON:  09/26/2014 FINDINGS: Single frontal view of the chest demonstrates an unremarkable cardiac silhouette. No airspace disease, effusion, or pneumothorax. No acute bony abnormalities. IMPRESSION: 1. No acute intrathoracic process. Electronically Signed   By: Randa Ngo M.D.   On: 11/03/2019 21:42    Scheduled Meds: . chlordiazePOXIDE  25 mg Oral QID   Followed by  . [START ON 11/05/2019] chlordiazePOXIDE  25 mg Oral TID   Followed by  . [START ON 11/06/2019] chlordiazePOXIDE  25 mg Oral BH-qamhs   Followed by  . [START ON 11/07/2019] chlordiazePOXIDE  25 mg Oral Daily  . folic acid  1 mg Oral Daily  . multivitamin with minerals  1 tablet Oral Daily  . pantoprazole  40 mg Oral Daily  . sodium chloride flush  3 mL Intravenous Q12H  . thiamine  100 mg Oral Daily   Or  . thiamine  100 mg Intravenous Daily    Continuous Infusions: . dextrose 5 % and 0.45 % NaCl with KCl 20 mEq/L 75 mL/hr at 11/04/19 1112     LOS: 0 days     Alma Friendly, MD [facility]  If 7PM-7AM, please contact night-coverage [email or web address] 11/04/2019, 3:00 PM

## 2019-11-04 NOTE — Progress Notes (Signed)
Subjective: Patient reports "I'm going to stop drinking."  Objective: Vital signs in last 24 hours: Temp:  [98.3 F (36.8 C)] 98.3 F (36.8 C) (07/09 2034) Pulse Rate:  [83-92] 92 (07/10 0912) Resp:  [16-26] 20 (07/10 0912) BP: (130-155)/(77-100) 139/96 (07/10 0912) SpO2:  [90 %-97 %] 95 % (07/10 0912) Weight:  [74.8 kg] 74.8 kg (07/09 2035)  Intake/Output from previous day: No intake/output data recorded. Intake/Output this shift: No intake/output data recorded.  Physical Exam: Patient is awake, oriented to person, place.  Says "I have to pee."  He is easily agitated.  Able to converse fluently about his past, job status, life as a retiree.  Neck non-tender, apart from base of skull.  Good ROM of neck with bending.  Lab Results: Recent Labs    11/03/19 2059 11/04/19 0225  WBC 8.1 9.2  HGB 13.7 14.3  HCT 41.2 42.3  PLT 219 229   BMET Recent Labs    11/03/19 2059 11/04/19 0225  NA 136 135  K 3.9 4.4  CL 103 103  CO2 21* 20*  GLUCOSE 120* 129*  BUN 7* 6*  CREATININE 1.01 0.83  CALCIUM 8.9 8.8*    Studies/Results: CT HEAD WO CONTRAST  Result Date: 11/04/2019 CLINICAL DATA:  Subarachnoid hemorrhage EXAM: CT HEAD WITHOUT CONTRAST TECHNIQUE: Contiguous axial images were obtained from the base of the skull through the vertex without intravenous contrast. COMPARISON:  Head CT 11/03/2019 FINDINGS: Brain: Unchanged pattern of subarachnoid hemorrhage over both cerebral hemispheres. Right convexity small subdural hemorrhage is also unchanged. No new site of hemorrhage. No midline shift or other mass effect. Vascular: No abnormal hyperdensity of the major intracranial arteries or dural venous sinuses. No intracranial atherosclerosis. Skull: Unchanged appearance of nondisplaced parasagittal occipital fracture. Sinuses/Orbits: No fluid levels or advanced mucosal thickening of the visualized paranasal sinuses. No mastoid or middle ear effusion. The orbits are normal. IMPRESSION:  Unchanged pattern of subarachnoid and subdural hemorrhage. Electronically Signed   By: Ulyses Jarred M.D.   On: 11/04/2019 05:32   CT Head Wo Contrast  Addendum Date: 11/03/2019   ADDENDUM REPORT: 11/03/2019 21:38 ADDENDUM: Upon further evaluation, an acute nondisplaced fracture is seen involving the occipital region of the skull, to the right of midline. This extends inferiorly along the skull base to the level of the foramen magnum. Electronically Signed   By: Virgina Norfolk M.D.   On: 11/03/2019 21:38   Result Date: 11/03/2019 CLINICAL DATA:  Status post fall. EXAM: CT HEAD WITHOUT CONTRAST TECHNIQUE: Contiguous axial images were obtained from the base of the skull through the vertex without intravenous contrast. COMPARISON:  November 09, 2018 FINDINGS: Brain: There is mild cerebral atrophy with widening of the extra-axial spaces and ventricular dilatation. There are areas of decreased attenuation within the white matter tracts of the supratentorial brain, consistent with microvascular disease changes. A mild-to-moderate amount of subarachnoid blood is seen along the tentorium cerebelli, bilaterally, as well as along the anterior aspect of the right temporal lobe and left frontal lobe. Involvement of the right temporoparietal region is also seen. There is no evidence of associated mass effect or midline shift. Vascular: No hyperdense vessel or unexpected calcification. Skull: Normal. Negative for fracture or focal lesion. Sinuses/Orbits: No acute finding. Other: Tiny metallic density foci are seen adjacent to the outer table of the skull along the right frontal region. These are present on the prior study. IMPRESSION: 1. Mild-to-moderate amount of subarachnoid blood along the tentorium cerebelli, bilaterally, as well as along the  right temporoparietal region, anterior aspect of the right temporal lobe and left frontal lobe. MRI correlation is recommended. 2. No evidence of associated mass effect or midline  shift. Electronically Signed: By: Virgina Norfolk M.D. On: 11/03/2019 21:27   CT Cervical Spine Wo Contrast  Result Date: 11/03/2019 CLINICAL DATA:  Status post fall. EXAM: CT CERVICAL SPINE WITHOUT CONTRAST TECHNIQUE: Multidetector CT imaging of the cervical spine was performed without intravenous contrast. Multiplanar CT image reconstructions were also generated. COMPARISON:  None. FINDINGS: Alignment: Normal. Skull base and vertebrae: No acute fracture involving the cervical spine. An acute nondisplaced fracture is seen involving the occipital region of the skull, to the right of midline. This extends inferiorly along the skull base to the level of the foramen magnum. Soft tissues and spinal canal: No prevertebral fluid or swelling. No visible canal hematoma. Disc levels: There is marked severity narrowing of the anterior atlanto axial articulation. Mild anterior osteophyte formation is seen at the levels of C3-C4, C4-C5, C5-C6 and C6-C7. Normal multilevel intervertebral disc spaces are seen. Mild bilateral multilevel facet joint hypertrophy is seen. Upper chest: Mild to moderate severity chronic appearing increased lung markings are present. Other: None. IMPRESSION: 1. Acute nondisplaced fracture involving the occipital region of the skull, to the right of midline. This extends inferiorly along the skull base to the level of the foramen magnum. 2. No acute fracture involving the cervical spine. Electronically Signed   By: Virgina Norfolk M.D.   On: 11/03/2019 21:38   DG Chest Portable 1 View  Result Date: 11/03/2019 CLINICAL DATA:  [place], syncope, dizziness, alcohol abuse EXAM: PORTABLE CHEST 1 VIEW COMPARISON:  09/26/2014 FINDINGS: Single frontal view of the chest demonstrates an unremarkable cardiac silhouette. No airspace disease, effusion, or pneumothorax. No acute bony abnormalities. IMPRESSION: 1. No acute intrathoracic process. Electronically Signed   By: Randa Ngo M.D.   On: 11/03/2019  21:42    Assessment/Plan: Head CT and SAH stable.  Patient's wife is fearful of his returning home.  She wants him to stop drinking.  Patient agrees it is time to stop drinking.  I have discussed situation with Dr. Horris Latino, who agrees that patient is neurosurgically stable and the big issues currently affecting the patient relate to his alcoholism.  She will take over as primary service and neurosurgery will consult.  Currently, there is nothing to do from a neurosurgical standpoint, other than mobilize patient.  I have discontinued his cervical collar.  He is no longer inebriated and has tenderness in the region of his skull fracture, but no tenderness in his cervical spine.  He has good ROM of his cervical spine and CT scan was negative for fracture.      LOS: 0 days    Peggyann Shoals, MD 11/04/2019, 9:50 AM

## 2019-11-05 DIAGNOSIS — H9190 Unspecified hearing loss, unspecified ear: Secondary | ICD-10-CM | POA: Diagnosis present

## 2019-11-05 DIAGNOSIS — S066X1D Traumatic subarachnoid hemorrhage with loss of consciousness of 30 minutes or less, subsequent encounter: Secondary | ICD-10-CM | POA: Diagnosis present

## 2019-11-05 DIAGNOSIS — S02119A Unspecified fracture of occiput, initial encounter for closed fracture: Secondary | ICD-10-CM | POA: Diagnosis present

## 2019-11-05 DIAGNOSIS — Z8249 Family history of ischemic heart disease and other diseases of the circulatory system: Secondary | ICD-10-CM | POA: Diagnosis not present

## 2019-11-05 DIAGNOSIS — S069X1A Unspecified intracranial injury with loss of consciousness of 30 minutes or less, initial encounter: Secondary | ICD-10-CM | POA: Diagnosis not present

## 2019-11-05 DIAGNOSIS — Y906 Blood alcohol level of 120-199 mg/100 ml: Secondary | ICD-10-CM | POA: Diagnosis present

## 2019-11-05 DIAGNOSIS — M069 Rheumatoid arthritis, unspecified: Secondary | ICD-10-CM | POA: Diagnosis present

## 2019-11-05 DIAGNOSIS — W1830XA Fall on same level, unspecified, initial encounter: Secondary | ICD-10-CM | POA: Diagnosis present

## 2019-11-05 DIAGNOSIS — Z801 Family history of malignant neoplasm of trachea, bronchus and lung: Secondary | ICD-10-CM | POA: Diagnosis not present

## 2019-11-05 DIAGNOSIS — F17210 Nicotine dependence, cigarettes, uncomplicated: Secondary | ICD-10-CM | POA: Diagnosis present

## 2019-11-05 DIAGNOSIS — K860 Alcohol-induced chronic pancreatitis: Secondary | ICD-10-CM | POA: Diagnosis not present

## 2019-11-05 DIAGNOSIS — G92 Toxic encephalopathy: Secondary | ICD-10-CM | POA: Diagnosis present

## 2019-11-05 DIAGNOSIS — F10229 Alcohol dependence with intoxication, unspecified: Secondary | ICD-10-CM | POA: Diagnosis present

## 2019-11-05 DIAGNOSIS — S065X9D Traumatic subdural hemorrhage with loss of consciousness of unspecified duration, subsequent encounter: Secondary | ICD-10-CM | POA: Diagnosis not present

## 2019-11-05 DIAGNOSIS — K5900 Constipation, unspecified: Secondary | ICD-10-CM | POA: Diagnosis present

## 2019-11-05 DIAGNOSIS — K219 Gastro-esophageal reflux disease without esophagitis: Secondary | ICD-10-CM | POA: Diagnosis present

## 2019-11-05 DIAGNOSIS — E878 Other disorders of electrolyte and fluid balance, not elsewhere classified: Secondary | ICD-10-CM | POA: Diagnosis not present

## 2019-11-05 DIAGNOSIS — S069X3D Unspecified intracranial injury with loss of consciousness of 1 hour to 5 hours 59 minutes, subsequent encounter: Secondary | ICD-10-CM | POA: Diagnosis not present

## 2019-11-05 DIAGNOSIS — R2689 Other abnormalities of gait and mobility: Secondary | ICD-10-CM | POA: Diagnosis present

## 2019-11-05 DIAGNOSIS — K760 Fatty (change of) liver, not elsewhere classified: Secondary | ICD-10-CM | POA: Diagnosis present

## 2019-11-05 DIAGNOSIS — I609 Nontraumatic subarachnoid hemorrhage, unspecified: Secondary | ICD-10-CM | POA: Diagnosis present

## 2019-11-05 DIAGNOSIS — R0682 Tachypnea, not elsewhere classified: Secondary | ICD-10-CM | POA: Diagnosis not present

## 2019-11-05 DIAGNOSIS — G441 Vascular headache, not elsewhere classified: Secondary | ICD-10-CM | POA: Diagnosis present

## 2019-11-05 DIAGNOSIS — R471 Dysarthria and anarthria: Secondary | ICD-10-CM | POA: Diagnosis present

## 2019-11-05 DIAGNOSIS — F10231 Alcohol dependence with withdrawal delirium: Secondary | ICD-10-CM | POA: Diagnosis present

## 2019-11-05 DIAGNOSIS — Z781 Physical restraint status: Secondary | ICD-10-CM | POA: Diagnosis not present

## 2019-11-05 DIAGNOSIS — G9340 Encephalopathy, unspecified: Secondary | ICD-10-CM | POA: Diagnosis not present

## 2019-11-05 DIAGNOSIS — I452 Bifascicular block: Secondary | ICD-10-CM | POA: Diagnosis present

## 2019-11-05 DIAGNOSIS — F1020 Alcohol dependence, uncomplicated: Secondary | ICD-10-CM | POA: Diagnosis not present

## 2019-11-05 DIAGNOSIS — S0990XA Unspecified injury of head, initial encounter: Secondary | ICD-10-CM | POA: Diagnosis not present

## 2019-11-05 DIAGNOSIS — W19XXXA Unspecified fall, initial encounter: Secondary | ICD-10-CM | POA: Diagnosis not present

## 2019-11-05 DIAGNOSIS — W010XXD Fall on same level from slipping, tripping and stumbling without subsequent striking against object, subsequent encounter: Secondary | ICD-10-CM | POA: Diagnosis present

## 2019-11-05 DIAGNOSIS — Z20822 Contact with and (suspected) exposure to covid-19: Secondary | ICD-10-CM | POA: Diagnosis present

## 2019-11-05 DIAGNOSIS — R296 Repeated falls: Secondary | ICD-10-CM | POA: Diagnosis present

## 2019-11-05 DIAGNOSIS — Z720 Tobacco use: Secondary | ICD-10-CM | POA: Diagnosis not present

## 2019-11-05 DIAGNOSIS — L219 Seborrheic dermatitis, unspecified: Secondary | ICD-10-CM | POA: Diagnosis present

## 2019-11-05 DIAGNOSIS — S069X0D Unspecified intracranial injury without loss of consciousness, subsequent encounter: Secondary | ICD-10-CM | POA: Diagnosis not present

## 2019-11-05 DIAGNOSIS — S066X1A Traumatic subarachnoid hemorrhage with loss of consciousness of 30 minutes or less, initial encounter: Secondary | ICD-10-CM | POA: Diagnosis not present

## 2019-11-05 DIAGNOSIS — E871 Hypo-osmolality and hyponatremia: Secondary | ICD-10-CM | POA: Diagnosis present

## 2019-11-05 DIAGNOSIS — Y92009 Unspecified place in unspecified non-institutional (private) residence as the place of occurrence of the external cause: Secondary | ICD-10-CM | POA: Diagnosis not present

## 2019-11-05 DIAGNOSIS — I10 Essential (primary) hypertension: Secondary | ICD-10-CM | POA: Diagnosis present

## 2019-11-05 DIAGNOSIS — S0291XA Unspecified fracture of skull, initial encounter for closed fracture: Secondary | ICD-10-CM | POA: Diagnosis not present

## 2019-11-05 DIAGNOSIS — S065X9A Traumatic subdural hemorrhage with loss of consciousness of unspecified duration, initial encounter: Secondary | ICD-10-CM | POA: Diagnosis present

## 2019-11-05 DIAGNOSIS — K709 Alcoholic liver disease, unspecified: Secondary | ICD-10-CM | POA: Diagnosis present

## 2019-11-05 DIAGNOSIS — R1310 Dysphagia, unspecified: Secondary | ICD-10-CM | POA: Diagnosis present

## 2019-11-05 DIAGNOSIS — J029 Acute pharyngitis, unspecified: Secondary | ICD-10-CM | POA: Diagnosis present

## 2019-11-05 DIAGNOSIS — F10931 Alcohol use, unspecified with withdrawal delirium: Secondary | ICD-10-CM

## 2019-11-05 DIAGNOSIS — Z8673 Personal history of transient ischemic attack (TIA), and cerebral infarction without residual deficits: Secondary | ICD-10-CM | POA: Diagnosis not present

## 2019-11-05 DIAGNOSIS — S069X3S Unspecified intracranial injury with loss of consciousness of 1 hour to 5 hours 59 minutes, sequela: Secondary | ICD-10-CM | POA: Diagnosis not present

## 2019-11-05 DIAGNOSIS — K861 Other chronic pancreatitis: Secondary | ICD-10-CM | POA: Diagnosis present

## 2019-11-05 DIAGNOSIS — S069X0A Unspecified intracranial injury without loss of consciousness, initial encounter: Secondary | ICD-10-CM | POA: Diagnosis not present

## 2019-11-05 DIAGNOSIS — F419 Anxiety disorder, unspecified: Secondary | ICD-10-CM | POA: Diagnosis present

## 2019-11-05 DIAGNOSIS — R1312 Dysphagia, oropharyngeal phase: Secondary | ICD-10-CM | POA: Diagnosis not present

## 2019-11-05 DIAGNOSIS — S069X0S Unspecified intracranial injury without loss of consciousness, sequela: Secondary | ICD-10-CM | POA: Diagnosis not present

## 2019-11-05 DIAGNOSIS — F1010 Alcohol abuse, uncomplicated: Secondary | ICD-10-CM | POA: Diagnosis not present

## 2019-11-05 DIAGNOSIS — S066X9A Traumatic subarachnoid hemorrhage with loss of consciousness of unspecified duration, initial encounter: Secondary | ICD-10-CM | POA: Diagnosis present

## 2019-11-05 LAB — CBC WITH DIFFERENTIAL/PLATELET
Abs Immature Granulocytes: 0.03 10*3/uL (ref 0.00–0.07)
Basophils Absolute: 0 10*3/uL (ref 0.0–0.1)
Basophils Relative: 0 %
Eosinophils Absolute: 0.1 10*3/uL (ref 0.0–0.5)
Eosinophils Relative: 1 %
HCT: 41.6 % (ref 39.0–52.0)
Hemoglobin: 14 g/dL (ref 13.0–17.0)
Immature Granulocytes: 0 %
Lymphocytes Relative: 14 %
Lymphs Abs: 1.4 10*3/uL (ref 0.7–4.0)
MCH: 33.5 pg (ref 26.0–34.0)
MCHC: 33.7 g/dL (ref 30.0–36.0)
MCV: 99.5 fL (ref 80.0–100.0)
Monocytes Absolute: 0.9 10*3/uL (ref 0.1–1.0)
Monocytes Relative: 9 %
Neutro Abs: 7.5 10*3/uL (ref 1.7–7.7)
Neutrophils Relative %: 76 %
Platelets: 223 10*3/uL (ref 150–400)
RBC: 4.18 MIL/uL — ABNORMAL LOW (ref 4.22–5.81)
RDW: 12.5 % (ref 11.5–15.5)
WBC: 9.9 10*3/uL (ref 4.0–10.5)
nRBC: 0 % (ref 0.0–0.2)

## 2019-11-05 LAB — BASIC METABOLIC PANEL
Anion gap: 8 (ref 5–15)
BUN: 6 mg/dL — ABNORMAL LOW (ref 8–23)
CO2: 24 mmol/L (ref 22–32)
Calcium: 8.9 mg/dL (ref 8.9–10.3)
Chloride: 100 mmol/L (ref 98–111)
Creatinine, Ser: 0.73 mg/dL (ref 0.61–1.24)
GFR calc Af Amer: >60 mL/min (ref >60)
GFR calc non Af Amer: >60 mL/min (ref >60)
Glucose, Bld: 141 mg/dL — ABNORMAL HIGH (ref 70–99)
Potassium: 4.5 mmol/L (ref 3.5–5.1)
Sodium: 132 mmol/L — ABNORMAL LOW (ref 135–145)

## 2019-11-05 LAB — MAGNESIUM: Magnesium: 2 mg/dL (ref 1.7–2.4)

## 2019-11-05 MED ORDER — LORAZEPAM 2 MG/ML IJ SOLN
INTRAMUSCULAR | Status: AC
  Filled 2019-11-05: qty 2

## 2019-11-05 MED ORDER — LORAZEPAM 2 MG/ML IJ SOLN
4.0000 mg | Freq: Once | INTRAMUSCULAR | Status: AC
  Administered 2019-11-05: 4 mg via INTRAVENOUS

## 2019-11-05 MED ORDER — CHLORHEXIDINE GLUCONATE CLOTH 2 % EX PADS
6.0000 | MEDICATED_PAD | Freq: Every day | CUTANEOUS | Status: DC
  Administered 2019-11-06 – 2019-11-10 (×5): 6 via TOPICAL

## 2019-11-05 MED ORDER — DEXMEDETOMIDINE HCL IN NACL 400 MCG/100ML IV SOLN
0.4000 ug/kg/h | INTRAVENOUS | Status: DC
  Administered 2019-11-05: 0.4 ug/kg/h via INTRAVENOUS
  Administered 2019-11-07: 0.2 ug/kg/h via INTRAVENOUS
  Filled 2019-11-05 (×3): qty 100

## 2019-11-05 MED ORDER — HYDRALAZINE HCL 25 MG PO TABS
25.0000 mg | ORAL_TABLET | Freq: Three times a day (TID) | ORAL | Status: DC | PRN
  Administered 2019-11-05: 25 mg via ORAL
  Filled 2019-11-05: qty 1

## 2019-11-05 NOTE — Progress Notes (Addendum)
Patient agitated and impulsive, not following commands, present for hallucinations where he continuously stated that he wanted to get out of bed and go to his garden and check the mail that was in the room (per patient); pt frequently re-oriented and not compliant with any safety measures however persistent on getting out of bed and kicking asking staff and his daughter and wife to get out of his way and wanting to go outside and smoke. CIWA score of 20 therefore administered 4mg  of Ativan, placed patients in 4 pt soft restraints including posey belt. Pt then tried biting restraints. Paged MD Horris Latino and called [facility] Nurse. Will re-assess in one hour.

## 2019-11-05 NOTE — Progress Notes (Signed)
Subjective: Patient reports that he is doing well. No acute overnight events were reported. He stated that he would like help with ceasing his ETOH use.   Objective: Vital signs in last 24 hours: Temp:  [97.5 F (36.4 C)-98 F (36.7 C)] 98 F (36.7 C) (07/11 0735) Pulse Rate:  [76-93] 93 (07/11 0735) Resp:  [18-26] 18 (07/11 0735) BP: (117-170)/(86-98) 165/89 (07/11 0735) SpO2:  [95 %-100 %] 100 % (07/11 0735)  Intake/Output from previous day: 07/10 0701 - 07/11 0700 In: 290 [P.O.:240; IV Piggyback:50] Out: 2600 [Urine:2600] Intake/Output this shift: No intake/output data recorded.  Physical Exam: Patient is awake, conversant and oriented to person and place. He remains easily agitated. Neck is non-tender to palpation, apart from base of skull.  Good ROM of neck with bending. No focal deficit was appreciated. Cranial nerves are intact. MAEW with good strength.   Lab Results: Recent Labs    11/04/19 0225 11/05/19 0313  WBC 9.2 9.9  HGB 14.3 14.0  HCT 42.3 41.6  PLT 229 223   BMET Recent Labs    11/04/19 0225 11/05/19 0313  NA 135 132*  K 4.4 4.5  CL 103 100  CO2 20* 24  GLUCOSE 129* 141*  BUN 6* 6*  CREATININE 0.83 0.73  CALCIUM 8.8* 8.9    Studies/Results: CT HEAD WO CONTRAST  Result Date: 11/04/2019 CLINICAL DATA:  Subarachnoid hemorrhage EXAM: CT HEAD WITHOUT CONTRAST TECHNIQUE: Contiguous axial images were obtained from the base of the skull through the vertex without intravenous contrast. COMPARISON:  Head CT 11/03/2019 FINDINGS: Brain: Unchanged pattern of subarachnoid hemorrhage over both cerebral hemispheres. Right convexity small subdural hemorrhage is also unchanged. No new site of hemorrhage. No midline shift or other mass effect. Vascular: No abnormal hyperdensity of the major intracranial arteries or dural venous sinuses. No intracranial atherosclerosis. Skull: Unchanged appearance of nondisplaced parasagittal occipital fracture. Sinuses/Orbits: No  fluid levels or advanced mucosal thickening of the visualized paranasal sinuses. No mastoid or middle ear effusion. The orbits are normal. IMPRESSION: Unchanged pattern of subarachnoid and subdural hemorrhage. Electronically Signed   By: Ulyses Jarred M.D.   On: 11/04/2019 05:32   CT Head Wo Contrast  Addendum Date: 11/03/2019   ADDENDUM REPORT: 11/03/2019 21:38 ADDENDUM: Upon further evaluation, an acute nondisplaced fracture is seen involving the occipital region of the skull, to the right of midline. This extends inferiorly along the skull base to the level of the foramen magnum. Electronically Signed   By: Virgina Norfolk M.D.   On: 11/03/2019 21:38   Result Date: 11/03/2019 CLINICAL DATA:  Status post fall. EXAM: CT HEAD WITHOUT CONTRAST TECHNIQUE: Contiguous axial images were obtained from the base of the skull through the vertex without intravenous contrast. COMPARISON:  November 09, 2018 FINDINGS: Brain: There is mild cerebral atrophy with widening of the extra-axial spaces and ventricular dilatation. There are areas of decreased attenuation within the white matter tracts of the supratentorial brain, consistent with microvascular disease changes. A mild-to-moderate amount of subarachnoid blood is seen along the tentorium cerebelli, bilaterally, as well as along the anterior aspect of the right temporal lobe and left frontal lobe. Involvement of the right temporoparietal region is also seen. There is no evidence of associated mass effect or midline shift. Vascular: No hyperdense vessel or unexpected calcification. Skull: Normal. Negative for fracture or focal lesion. Sinuses/Orbits: No acute finding. Other: Tiny metallic density foci are seen adjacent to the outer table of the skull along the right frontal region. These are present  on the prior study. IMPRESSION: 1. Mild-to-moderate amount of subarachnoid blood along the tentorium cerebelli, bilaterally, as well as along the right temporoparietal region,  anterior aspect of the right temporal lobe and left frontal lobe. MRI correlation is recommended. 2. No evidence of associated mass effect or midline shift. Electronically Signed: By: Virgina Norfolk M.D. On: 11/03/2019 21:27   CT Cervical Spine Wo Contrast  Result Date: 11/03/2019 CLINICAL DATA:  Status post fall. EXAM: CT CERVICAL SPINE WITHOUT CONTRAST TECHNIQUE: Multidetector CT imaging of the cervical spine was performed without intravenous contrast. Multiplanar CT image reconstructions were also generated. COMPARISON:  None. FINDINGS: Alignment: Normal. Skull base and vertebrae: No acute fracture involving the cervical spine. An acute nondisplaced fracture is seen involving the occipital region of the skull, to the right of midline. This extends inferiorly along the skull base to the level of the foramen magnum. Soft tissues and spinal canal: No prevertebral fluid or swelling. No visible canal hematoma. Disc levels: There is marked severity narrowing of the anterior atlanto axial articulation. Mild anterior osteophyte formation is seen at the levels of C3-C4, C4-C5, C5-C6 and C6-C7. Normal multilevel intervertebral disc spaces are seen. Mild bilateral multilevel facet joint hypertrophy is seen. Upper chest: Mild to moderate severity chronic appearing increased lung markings are present. Other: None. IMPRESSION: 1. Acute nondisplaced fracture involving the occipital region of the skull, to the right of midline. This extends inferiorly along the skull base to the level of the foramen magnum. 2. No acute fracture involving the cervical spine. Electronically Signed   By: Virgina Norfolk M.D.   On: 11/03/2019 21:38   DG Chest Portable 1 View  Result Date: 11/03/2019 CLINICAL DATA:  [place], syncope, dizziness, alcohol abuse EXAM: PORTABLE CHEST 1 VIEW COMPARISON:  09/26/2014 FINDINGS: Single frontal view of the chest demonstrates an unremarkable cardiac silhouette. No airspace disease, effusion, or  pneumothorax. No acute bony abnormalities. IMPRESSION: 1. No acute intrathoracic process. Electronically Signed   By: Randa Ngo M.D.   On: 11/03/2019 21:42    Assessment/Plan: The patient continues to express interest in ceasing his alcohol use and would like medical assistance to assist him. He continues to have continues to have tenderness to palpation in the region of his skull fracture but no cervical spine tenderness was appreciated. He has good ROM of his cervical spine. There are no new neurosurgical recommendations to offer at this time. Continue to ambulate patient.      LOS: 0 days    Peggyann Shoals, MD 11/05/2019, 9:11 AM

## 2019-11-05 NOTE — Plan of Care (Signed)
  Problem: Safety: Goal: Ability to remain free from injury will improve Note: Patient is a high fall risk r/t unsteady gait with occasional high CIWA scores r/t alcohol withdrawal. Pt constantly tired to get out of chair and ambulate without staff assistance and became agitated. He is not adherent to using the call bell despite re-education. Pt is alert and oriented x2 but not time and place stating "I need to go check the mail." Fall precautions maintained per hospital policy. Patient educated on fall risk, risk for injury, and hospital safety and prevention practices, Although he verbalized understanding and he is non compliant with safety precautions. Fall band and non-skid footwear on. Call bell, bedside table, and personal belongings within reach. Bed and chair alarm on and effective. Bed in lowest position with wheels locked. Staff monitoring patient for personal needs ever 1-2 hours and as needed.

## 2019-11-05 NOTE — Progress Notes (Signed)
PROGRESS NOTE  William Gibson CHY:[id number] DOB: 1945-06-14 DOA: 11/03/2019 PCP: Patient, No Pcp Per  HPI/Recap of past 24 hours: HPI from Dr Tonie Griffith Caio Devera is a 74 year old white male with a history of alcoholism, rheumatoid arthritis, alcoholic liver disease, tobacco use, previous CVA. Reportedly patient was at home doing some work outside when he fell and hit the back of his head on the concrete driveway.  He had loss of consciousness that was prolonged at approximately 10 minutes. Pt has no memory of the event and could not provide any history. Wife reports that he drinks up to 1/5 of whiskey most days of the week.  She states that he has had tremors and been agitated in the past when he goes without alcohol for a few days.  She does not think he has ever been in DTs.  He has frequent falls at home with unsteady gait. In the emergency room patient was found to have a subarachnoid hemorrhage and basilar skull fracture in the occipital region on CT of his head.  Neurosurgery admitted patient, and [facility]ists were consulted.  Repeat CT head showed unchanged subarachnoid hemorrhage and neurosurgery had no further recommendation and asked [facility] hospitalist to take over to assist in managing alcohol abuse/withdrawal.    Overnight and this a.m. patient required IV Ativan.  Patient seen and examined at bedside, denies any new complaints, requesting to go smoke.  Daughter at bedside, all questions answered.   Assessment/Plan: Principal Problem:   Alcoholism ([place]) Active Problems:   Tobacco abuse   Subarachnoid hemorrhage ([place])   Skull fracture ([place])   Head injury   Fall at home, initial encounter   Loss of consciousness ([place])   Subarachnoid hemorrhage/basilar skull fracture status post mechanical fall Management by neurosurgery  Alcohol abuse/withdrawal Mild transaminitis Status post IV fluids CIWA protocol Continue Librium detox taper Continue folic acid, thiamine,  multivitamin Social worker consult for resources  Mechanical fall Likely secondary to above EKG with no acute ST changes, troponin negative PT/OT  Hypomagnesemia Replace as needed  Tobacco abuse Advised to quit Nicotine patch ordered       Malnutrition Type:      Malnutrition Characteristics:      Nutrition Interventions:       Estimated body mass index is 23.68 kg/m as calculated from the following:   Height as of this encounter: 5\' 10"  (1.778 m).   Weight as of this encounter: 74.8 kg.     Code Status: Full  Family Communication: Discussed with daughter at bedside on 11/05/19  Disposition Plan: Status is: Inpatient  The patient will require care spanning > 2 midnights and should be moved to inpatient because: Inpatient level of care appropriate due to severity of illness  Dispo: The patient is from: Home              Anticipated d/c is to: Home              Anticipated d/c date is: 1 day              Patient currently is not medically stable to d/c.    Consultants:  Neurosurgery  Procedures:  None  Antimicrobials:  None  DVT prophylaxis: SCDs   Objective: Vitals:   11/05/19 0320 11/05/19 0735 11/05/19 0830 11/05/19 1300  BP: (!) 170/98 (!) 165/89    Pulse: 80 93 76 80  Resp: 18 18    Temp: 97.8 F (36.6 C) 98 F (36.7 C)  TempSrc: Oral     SpO2: 97% 100%    Weight:      Height:        Intake/Output Summary (Last 24 hours) at 11/05/2019 1537 Last data filed at 11/05/2019 1258 Gross per 24 hour  Intake 360 ml  Output 2800 ml  Net -2440 ml   Filed Weights   11/03/19 2035  Weight: 74.8 kg    Exam:  General: NAD   Cardiovascular: S1, S2 present  Respiratory: CTAB  Abdomen: Soft, nontender, nondistended, bowel sounds present  Musculoskeletal: No bilateral pedal edema noted  Skin: Normal  Psychiatry: Normal mood  Neurology: No obvious focal neurologic deficits noted    Data Reviewed: CBC: Recent Labs    Lab 11/03/19 2059 11/04/19 0225 11/05/19 0313  WBC 8.1 9.2 9.9  NEUTROABS  --   --  7.5  HGB 13.7 14.3 14.0  HCT 41.2 42.3 41.6  MCV 99.8 98.6 99.5  PLT 219 229 841   Basic Metabolic Panel: Recent Labs  Lab 11/03/19 2059 11/04/19 0225 11/05/19 0313  NA 136 135 132*  K 3.9 4.4 4.5  CL 103 103 100  CO2 21* 20* 24  GLUCOSE 120* 129* 141*  BUN 7* 6* 6*  CREATININE 1.01 0.83 0.73  CALCIUM 8.9 8.8* 8.9  MG  --  1.6* 2.0  PHOS  --  3.4  --    GFR: Estimated Creatinine Clearance: 83.6 mL/min (by C-G formula based on SCr of 0.73 mg/dL). Liver Function Tests: Recent Labs  Lab 11/03/19 2059 11/04/19 0225  AST 80* 69*  ALT 70* 66*  ALKPHOS 67 66  BILITOT 0.4 0.8  PROT 7.1 7.2  ALBUMIN 3.2* 3.3*   Recent Labs  Lab 11/03/19 2059  LIPASE 43   No results for input(s): AMMONIA in the last 168 hours. Coagulation Profile: No results for input(s): INR, PROTIME in the last 168 hours. Cardiac Enzymes: No results for input(s): CKTOTAL, CKMB, CKMBINDEX, TROPONINI in the last 168 hours. BNP (last 3 results) No results for input(s): PROBNP in the last 8760 hours. HbA1C: No results for input(s): HGBA1C in the last 72 hours. CBG: No results for input(s): GLUCAP in the last 168 hours. Lipid Profile: No results for input(s): CHOL, HDL, LDLCALC, TRIG, CHOLHDL, LDLDIRECT in the last 72 hours. Thyroid Function Tests: No results for input(s): TSH, T4TOTAL, FREET4, T3FREE, THYROIDAB in the last 72 hours. Anemia Panel: No results for input(s): VITAMINB12, FOLATE, FERRITIN, TIBC, IRON, RETICCTPCT in the last 72 hours. Urine analysis:    Component Value Date/Time   COLORURINE YELLOW 11/04/2019 0935   APPEARANCEUR CLEAR 11/04/2019 0935   LABSPEC 1.013 11/04/2019 0935   PHURINE 7.0 11/04/2019 0935   GLUCOSEU NEGATIVE 11/04/2019 0935   GLUCOSEU NEGATIVE 11/10/2018 0917   HGBUR NEGATIVE 11/04/2019 0935   BILIRUBINUR NEGATIVE 11/04/2019 0935   KETONESUR NEGATIVE 11/04/2019 0935    PROTEINUR NEGATIVE 11/04/2019 0935   UROBILINOGEN 0.2 11/10/2018 0917   NITRITE NEGATIVE 11/04/2019 0935   LEUKOCYTESUR NEGATIVE 11/04/2019 0935   Sepsis Labs: @LABRCNTIP (procalcitonin:4,lacticidven:4)  ) Recent Results (from the past 240 hour(s))  SARS Coronavirus 2 by RT PCR (hospital order, performed in [facility] hospital lab) Nasopharyngeal Nasopharyngeal Swab     Status: None   Collection Time: 11/03/19 10:38 PM   Specimen: Nasopharyngeal Swab  Result Value Ref Range Status   SARS Coronavirus 2 NEGATIVE NEGATIVE Final    Comment: (NOTE) SARS-CoV-2 target nucleic acids are NOT DETECTED.  The SARS-CoV-2 RNA is generally detectable in upper and  lower respiratory specimens during the acute phase of infection. The lowest concentration of SARS-CoV-2 viral copies this assay can detect is 250 copies / mL. A negative result does not preclude SARS-CoV-2 infection and should not be used as the sole basis for treatment or other patient management decisions.  A negative result may occur with improper specimen collection / handling, submission of specimen other than nasopharyngeal swab, presence of viral mutation(s) within the areas targeted by this assay, and inadequate number of viral copies (<250 copies / mL). A negative result must be combined with clinical observations, patient history, and epidemiological information.  Fact Sheet for Patients:   StrictlyIdeas.no  Fact Sheet for Healthcare Providers: BankingDealers.co.za  This test is not yet approved or  cleared by the [place] FDA and has been authorized for detection and/or diagnosis of SARS-CoV-2 by FDA under an Emergency Use Authorization (EUA).  This EUA will remain in effect (meaning this test can be used) for the duration of the COVID-19 declaration under Section 564(b)(1) of the Act, 21 U.S.C. section 360bbb-3(b)(1), unless the authorization is terminated or revoked  sooner.  Performed at [facility], [place] [address], [place], [place] [address]   MRSA PCR Screening     Status: None   Collection Time: 11/04/19  3:01 PM   Specimen: Nasopharyngeal Wash  Result Value Ref Range Status   MRSA by PCR NEGATIVE NEGATIVE Final    Comment:        The GeneXpert MRSA Assay (FDA approved for NASAL specimens only), is one component of a comprehensive MRSA colonization surveillance program. It is not intended to diagnose MRSA infection nor to guide or monitor treatment for MRSA infections. Performed at [facility], [place] [address], [place], [place] [address]       Studies: No results found.  Scheduled Meds: . chlordiazePOXIDE  25 mg Oral TID   Followed by  . [START ON 11/06/2019] chlordiazePOXIDE  25 mg Oral BH-qamhs   Followed by  . [START ON 11/07/2019] chlordiazePOXIDE  25 mg Oral Daily  . folic acid  1 mg Oral Daily  . multivitamin with minerals  1 tablet Oral Daily  . nicotine  21 mg Transdermal Daily  . pantoprazole  40 mg Oral Daily  . sodium chloride flush  3 mL Intravenous Q12H  . thiamine  100 mg Oral Daily   Or  . thiamine  100 mg Intravenous Daily    Continuous Infusions:    LOS: 0 days     Alma Friendly, MD [facility]  If 7PM-7AM, please contact night-coverage [email or web address] 11/05/2019, 3:37 PM

## 2019-11-05 NOTE — Progress Notes (Signed)
Inpatient Rehab Admissions Coordinator Note:   Per PT recommendation, pt was screened for CIR candidacy by Gayland Curry, MS, CCC-SLP.  At this time we are not recommending an Inpatient Rehab consult.  Will continue to monitor pt's progress with acute therapies to determine if pt would be an appropriate CIR candidate.  Please contact me with questions.    Gayland Curry, [place], [place] Admissions Coordinator [phone] 11/05/19 3:33 PM

## 2019-11-05 NOTE — Significant Event (Signed)
Rapid Response Event Note  Overview: Time Called: 8648 Arrival Time: 1705 Event Type: Other (Comment) (CIWA 20)  Initial Focused Assessment: Pt restless, agitated currently in restraints. He does not follow commands. He is disoriented x3. PERRLA, 96mm. Pt is moving all extremities. Lung sounds are clear. Per family, pt last drink was Friday (11/03/2019).   1647: VS: BP 147/123, HR 98, RR 24, SpO2 97%  CIWA 20 4mg  IV ativan given  1742: CIWA 21 4mg  IV Ativan given  Interventions: No intervention from RR RN  Plan of Care (if not transferred): -Ativan administration per CIWA score, reevaluate CIWA 1 hour post Ativan administration and per ordered frequency.  Call rapid response for additional needs.  Event Summary: Name of Physician Notified: Dr. Horris Latino at  (Notified by primary RN) Outcome: Other (Comment) (RRRN called away to another emergency. RN to notify attending of CIWA score >20.) Event End Time: [place]

## 2019-11-05 NOTE — Consult Note (Signed)
NAME:  William Gibson, MRN:  [id number], DOB:  05/15/1945, LOS: 0 ADMISSION DATE:  11/03/2019, CONSULTATION DATE:  11/05/19 REFERRING MD:  Hospitalist, CHIEF COMPLAINT:  ETOH withdrawal   Brief History   74 y.o. M with PMH ETOH abuse who presented after a fall on 7/9 and was admitted with fall and mlld SAH for observation and developed withdrawal and delirium requiring multiple doses of Ativan. PCCM consulted for possible ICU transfer.  History of present illness   William Gibson is a 74 y.o. M with PMH of ETOH abuse (over 5th whiskey per day), alcoholic liver disease, tobacco use, RA and HL who was drinking and had a fall striking his head with resulting SAH.  He was seen by neurosurgery with no surgical intervention indicated   He was admitted for observation and subsequently developed worsening ETOH withdrawal with DT's.  He received Librium and CIWA protocol throughout the day and had just fallen asleep at the time of consult.   Approximately 16mg  Ativan given in the last 8hrs.  Family states last drink was around 6pm 7/9 and patient has never gone through withdrawals as has never stopped drinking in the past.  No history of seizures.   Past Medical History   has a past medical history of Abnormal liver function tests (3/76/2831), Alcoholic liver disease ([place]) (12/15/2018), Alcoholism ([place]), Elevated LDL cholesterol level (05/22/2013), Epistaxis, Erectile dysfunction (05/22/2013), Frequent falls (11/09/2018), GERD (gastroesophageal reflux disease), Hyperammonemia ([place]) (11/15/2018), Memory change (08/17/2017), RA (rheumatoid arthritis) ([place]) (08/17/2017), RBBB (12/15/2018), Rheumatoid arthritis ([place]) (2010), Shuffling gait (12/19/2018), Tobacco abuse (04/26/2012), Weight loss, unintentional (11/09/2018), and 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   Significant Hospital Events   7/9 Admit to Neurosrugery 7/11 Worsening DT's, PCCM consult  Consults:  PCCM  Procedures:    Significant  Diagnostic Tests:  7/9 CT head>>acute nondisplaced fracture is seen involving the occipital region of the skull, Mild-to-moderate amount of subarachnoid blood along the tentorium cerebelli, bilaterally 7/9 CT C-spine>>no acute fx 7/10 CT head>>unchanged SAH and subdural  Micro Data:  7/10 MRSA screen>>negative 7/9 Sars-CoV-2>>negative  Antimicrobials:    Interim history/subjective:  Pt sleeping comfortably without HTN or tachycardia  Objective   Blood pressure (!) 155/84, pulse 78, temperature 99.2 F (37.3 C), temperature source Axillary, resp. rate 20, height 5\' 10"  (1.778 m), weight 74.8 kg, SpO2 95 %.        Intake/Output Summary (Last 24 hours) at 11/05/2019 2029 Last data filed at 11/05/2019 1258 Gross per 24 hour  Intake 360 ml  Output 1675 ml  Net -1315 ml   Filed Weights   11/03/19 2035  Weight: 74.8 kg    General:  Elderly M, sleeping without distress HEENT: MM pink/moist Neuro: not tested as patient had just fallen asleep, moving all extremities per report CV: s1s2 rrr, no m/r/g PULM:  Course breath sounds bilateral bases without wheezing or rhonchi GI: soft, bsx4 active  Extremities: warm/dry, no edema  Skin: no rashes or lesions  Resolved Hospital Problem list     Assessment & Plan:   Alcohol abuse with acute withdrawal and DT"s Currently resting, but if develops agitation overnight would transfer to ICU for precedex gtt -Continue CIWA, Librium, folic acid, MV and thiamine with close monitoring  Fall with Subarachnoid hemorrhage and basilar skull fracture Did not require surgical intervention and repeat head CT without worsening bleed, neurosurgery following  Hypomagnesemia  -continue to replete as needed  Best practice:  Diet: NPO Pain/Anxiety/Delirium protocol (if indicated):  CIWA VAP protocol (if indicated): n/a DVT prophylaxis: SCD's GI prophylaxis: n/a Glucose control: n/a Mobility: bed rest Code Status: full code Family Communication:  Wife and daughter updated at the bedside Disposition: ICU  Labs   CBC: Recent Labs  Lab 11/03/19 2059 11/04/19 0225 11/05/19 0313  WBC 8.1 9.2 9.9  NEUTROABS  --   --  7.5  HGB 13.7 14.3 14.0  HCT 41.2 42.3 41.6  MCV 99.8 98.6 99.5  PLT 219 229 109    Basic Metabolic Panel: Recent Labs  Lab 11/03/19 2059 11/04/19 0225 11/05/19 0313  NA 136 135 132*  K 3.9 4.4 4.5  CL 103 103 100  CO2 21* 20* 24  GLUCOSE 120* 129* 141*  BUN 7* 6* 6*  CREATININE 1.01 0.83 0.73  CALCIUM 8.9 8.8* 8.9  MG  --  1.6* 2.0  PHOS  --  3.4  --    GFR: Estimated Creatinine Clearance: 83.6 mL/min (by C-G formula based on SCr of 0.73 mg/dL). Recent Labs  Lab 11/03/19 2059 11/04/19 0225 11/05/19 0313  WBC 8.1 9.2 9.9    Liver Function Tests: Recent Labs  Lab 11/03/19 2059 11/04/19 0225  AST 80* 69*  ALT 70* 66*  ALKPHOS 67 66  BILITOT 0.4 0.8  PROT 7.1 7.2  ALBUMIN 3.2* 3.3*   Recent Labs  Lab 11/03/19 2059  LIPASE 43   No results for input(s): AMMONIA in the last 168 hours.  ABG No results found for: PHART, PCO2ART, PO2ART, HCO3, TCO2, ACIDBASEDEF, O2SAT   Coagulation Profile: No results for input(s): INR, PROTIME in the last 168 hours.  Cardiac Enzymes: No results for input(s): CKTOTAL, CKMB, CKMBINDEX, TROPONINI in the last 168 hours.  HbA1C: Hgb A1c MFr Bld  Date/Time Value Ref Range Status  11/10/2018 09:17 AM 5.9 4.6 - 6.5 % Final    Comment:    Glycemic Control Guidelines for People with Diabetes:Non Diabetic:  <6%Goal of Therapy: <7%Additional Action Suggested:  >8%     CBG: No results for input(s): GLUCAP in the last 168 hours.  Review of Systems:   Unable to obtain secondary to mental status  Past Medical History  He,  has a past medical history of Abnormal liver function tests (07/18/5571), Alcoholic liver disease ([place]) (12/15/2018), Alcoholism ([place]), Elevated LDL cholesterol level (05/22/2013), Epistaxis, Erectile dysfunction (05/22/2013), Frequent  falls (11/09/2018), GERD (gastroesophageal reflux disease), Hyperammonemia ([place]) (11/15/2018), Memory change (08/17/2017), RA (rheumatoid arthritis) ([place]) (08/17/2017), RBBB (12/15/2018), Rheumatoid arthritis ([place]) (2010), Shuffling gait (12/19/2018), Tobacco abuse (04/26/2012), Weight loss, unintentional (11/09/2018), and 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   Surgical History    Past Surgical History:  Procedure Laterality Date  . NASAL ENDOSCOPY WITH EPISTAXIS CONTROL N/A 04/12/2018   Procedure: NASAL ENDOSCOPY WITH EPISTAXIS CONTROL;  Surgeon: Melissa Montane, MD;  Location: [facility];  Service: ENT;  Laterality: N/A;  . VASECTOMY    . VASECTOMY REVERSAL       Social History   reports that he has been smoking cigarettes. He has a 87.00 pack-year smoking history. He has never used smokeless tobacco. He reports current alcohol use. He reports that he does not use drugs.   Family History   His family history includes Heart disease in his mother; Lung cancer in his father. There is no history of Colon cancer, Esophageal cancer, or Stomach cancer.   Allergies No Known Allergies   Home Medications  Prior to Admission medications   Medication Sig Start Date End Date Taking? Authorizing  Provider  Cyanocobalamin (VITAMIN B-12 PO) Take 1 tablet by mouth daily.   Yes [provider]  meloxicam (MOBIC) 15 MG tablet Take 15 mg by mouth daily. 07/18/15  Yes [provider]  omeprazole (PRILOSEC OTC) 20 MG tablet Take 20 mg by mouth daily.   Yes [provider]  thiamine (VITAMIN B-1) 100 MG tablet Take 1 tablet (100 mg total) by mouth daily. 11/16/18  Yes Lucille Passy, MD  chlordiazePOXIDE (LIBRIUM) 25 MG capsule 50mg  PO TID x 1D, then 25-50mg  PO BID X 1D, then 25-50mg  PO QD X 1D Patient not taking: Reported on 11/03/2019 10/23/19   Dorie Rank, MD  Cobalamin Combinations (VITAMIN B12-FOLIC ACID) 545-625 MCG TABS 1 tab by mouth daily. Patient not taking: Reported on  11/03/2019 11/16/18   Lucille Passy, MD     Critical care time: 45 minutes      CRITICAL CARE Performed by: Otilio Carpen [place]   Total critical care time: 45 minutes  Critical care time was exclusive of separately billable procedures and treating other patients.  Critical care was necessary to treat or prevent imminent or life-threatening deterioration.  Critical care was time spent personally by me on the following activities: development of treatment plan with patient and/or surrogate as well as nursing, discussions with consultants, evaluation of patient's response to treatment, examination of patient, obtaining history from patient or surrogate, ordering and performing treatments and interventions, ordering and review of laboratory studies, ordering and review of radiographic studies, pulse oximetry and re-evaluation of patient's condition.  Otilio Carpen [place], PA-C

## 2019-11-05 NOTE — Progress Notes (Signed)
[facility] Progress Note Patient Name: William Gibson DOB: 08-Oct-1945 MRN: [id number]   Date of Service  11/05/2019  HPI/Events of Note  Patient admitted with fall, basilar skull fracture, traumatic SAH and SDH, who developed Delirium Tremens, he reportedly drinks a fifth of hard liquor daily.  eICU Interventions  New patient Evaluation CIWA Protocol + Precedex        Frederik Pear 11/05/2019, 10:30 PM

## 2019-11-05 NOTE — Evaluation (Signed)
Physical Therapy Evaluation Patient Details Name: William Gibson MRN: [id number] DOB: 07-20-1945 Today's Date: 11/05/2019   History of Present Illness  Patient is a 74 year old white male with a history of alcoholism, rheumatoid arthritis, alcoholic liver disease, tobacco use, previous CVA.  Patient was at home doing some work outside when he fell and hit the back of his head on the concrete driveway with positive LOC.  Head CT positive for subarachnoid hemorrhage and basilar skull fracture.  Clinical Impression  Patient presents with decreased balance with ataxic gait with shuffling feet and high fall risk.  Currently min to mod A with RW for ambulation in hallway running into obstacles on R side.  He has poor safety awareness and decreased memory.  Feel he will need assist for mobility at home and would benefit from CIR stay if they admit him as inpatient.  Recommended to daughter to limit outside walking right now (due to fall risk) and to use RW and assist in the home.  She reports wife can assist, but not pick him up from the floor.  PT to continue to follow acutely.     Follow Up Recommendations CIR (HHPT if he declines or isn't admitted)    Equipment Recommendations  None recommended by PT    Recommendations for Other Services Rehab consult     Precautions / Restrictions Precautions Precautions: Fall Restrictions Weight Bearing Restrictions: No      Mobility  Bed Mobility Overal bed mobility: Needs Assistance Bed Mobility: Supine to Sit     Supine to sit: Supervision;HOB elevated     General bed mobility comments: assist for lines  Transfers Overall transfer level: Needs assistance Equipment used: Rolling walker (2 wheeled) Transfers: Sit to/from Stand Sit to Stand: Min guard         General transfer comment: cues for hand placement, minguard for balance  Ambulation/Gait Ambulation/Gait assistance: Min assist;Mod assist Gait Distance (Feet): 100  Feet Assistive device: Rolling walker (2 wheeled) Gait Pattern/deviations: Step-to pattern;Step-through pattern;Decreased stride length;Shuffle;Staggering left;Ataxic     General Gait Details: leaning L initially, then with anterior bias and shuffling feet, cues to stop and regain balance and for foot clearance; ran into several obstacles on R side (computer, two desk chairs,) but cleared doorway fine to enter room on return trip as well as getting around end of bed to sit in recliner  Stairs            Wheelchair Mobility    Modified Rankin (Stroke Patients Only)       Balance Overall balance assessment: Needs assistance   Sitting balance-Leahy Scale: Fair     Standing balance support: Bilateral upper extremity supported Standing balance-Leahy Scale: Poor Standing balance comment: UE support for balance, min to mod A for dynamic balance with UE support                             Pertinent Vitals/Pain Pain Assessment: Faces Faces Pain Scale: Hurts little more Pain Location: headache when coughing Pain Descriptors / Indicators: Headache Pain Intervention(s): Repositioned;Monitored during session    Home Living Family/patient expects to be discharged to:: Private residence Living Arrangements: Spouse/significant other Available Help at Discharge: Family Type of Home: House Home Access: Stairs to enter Entrance Stairs-Rails: Right Entrance Stairs-Number of Steps: 3 Home Layout: One level Home Equipment: Environmental consultant - 2 wheels;Other (comment);Shower seat Additional Comments: walking stick; states has a chair he can put into the shower  Prior Function Level of Independence: Independent with assistive device(s)         Comments: reports multiple falls at home, uses walking stick outside, enjoys yardwork, Daughter present today reports her mom can't get him up when he falls, pt reports he gets up on his own     Hand Dominance        Extremity/Trunk  Assessment   Upper Extremity Assessment Upper Extremity Assessment: Overall WFL for tasks assessed (some crepitus L shoulder with testing)    Lower Extremity Assessment Lower Extremity Assessment: Overall WFL for tasks assessed    Cervical / Trunk Assessment Cervical / Trunk Assessment: Kyphotic  Communication   Communication: HOH  Cognition Arousal/Alertness: Awake/alert Behavior During Therapy: WFL for tasks assessed/performed Overall Cognitive Status: Impaired/Different from baseline Area of Impairment: Attention;Safety/judgement                   Current Attention Level: Sustained     Safety/Judgement: Decreased awareness of safety;Decreased awareness of deficits     General Comments: oriented, but running into obstacles on the R and mod cues for safety with ambulation leaning and shuffling      General Comments General comments (skin integrity, edema, etc.): Daughter present and reports having issues with shuffling gait with falls at home for a while, not sure if due to brain atrophy or some other cause.  Feels he would not use a rollator outdoors as suggested since he likes to work in the yard, was asking about cane with tripod tip    Exercises     Assessment/Plan    PT Assessment Patient needs continued PT services  PT Problem List Decreased strength;Decreased mobility;Decreased safety awareness;Decreased balance;Decreased knowledge of use of DME;Decreased activity tolerance       PT Treatment Interventions DME instruction;Therapeutic activities;Cognitive remediation;Gait training;Therapeutic exercise;Patient/family education;Balance training;Stair training;Functional mobility training    PT Goals (Current goals can be found in the Care Plan section)  Acute Rehab PT Goals Patient Stated Goal: to go home PT Goal Formulation: With patient/family Time For Goal Achievement: 11/19/19 Potential to Achieve Goals: Good    Frequency Min 3X/week   Barriers to  discharge        Co-evaluation               AM-PAC PT "6 Clicks" Mobility  Outcome Measure Help needed turning from your back to your side while in a flat bed without using bedrails?: None Help needed moving from lying on your back to sitting on the side of a flat bed without using bedrails?: None Help needed moving to and from a bed to a chair (including a wheelchair)?: A Little Help needed standing up from a chair using your arms (e.g., wheelchair or bedside chair)?: A Little Help needed to walk in hospital room?: A Lot Help needed climbing 3-5 steps with a railing? : A Lot 6 Click Score: 18    End of Session Equipment Utilized During Treatment: Gait belt Activity Tolerance: Patient limited by fatigue Patient left: in chair;with call bell/phone within reach;with chair alarm set;with family/visitor present   PT Visit Diagnosis: Other abnormalities of gait and mobility (R26.89);History of falling (Z91.81);Other symptoms and signs involving the nervous system (R29.898)    Time: 4098-1191 PT Time Calculation (min) (ACUTE ONLY): 25 min   Charges:   PT Evaluation $PT Eval Moderate Complexity: 1 Mod PT Treatments $Gait Training: 8-22 mins        Magda Kiel, PT Acute Rehabilitation Services [phone] [phone] 11/05/2019  [name] 11/05/2019, 1:44 PM

## 2019-11-05 NOTE — Evaluation (Signed)
Occupational Therapy Evaluation Patient Details Name: William Gibson MRN: [id number] DOB: Sep 08, 1945 Today's Date: 11/05/2019    History of Present Illness Patient is a 74 year old white male with a history of alcoholism, rheumatoid arthritis, alcoholic liver disease, tobacco use, previous CVA.  Patient was at home doing some work outside when he fell and hit the back of his head on the concrete driveway with positive LOC.  Head CT positive for subarachnoid hemorrhage and basilar skull fracture.   Clinical Impression   Pt admitted with above. He demonstrates the below listed deficits and will benefit from continued OT to maximize safety and independence with BADLs.  Pt very confused, restless, with increasing agitation during OT session.  He repeatedly attempted to go get his mail, and perseverated on seeing a zucchini and needing to tend to his garden.  He was unable to sustain his attention to engage in any ADL tasks this date and thus requires total A for all except self feeding and required mod A +2 for safety for functional ambulation.  He repeatedly ran his RW into the wall on the left with no awareness and no ability to self correct.  He lives with his wife and was independent PTA, but has a h/o falls.  Anticipate he will require post acute rehab.  Will follow acutely.       Follow Up Recommendations  CIR    Equipment Recommendations  None recommended by OT    Recommendations for Other Services Rehab consult     Precautions / Restrictions Precautions Precautions: Fall      Mobility Bed Mobility Overal bed mobility: Needs Assistance Bed Mobility: Sit to Supine     Supine to sit: Mod assist     General bed mobility comments: Pt required assist to guide him to the bed and to lift his feet into the bed   Transfers Overall transfer level: Needs assistance Equipment used: Rolling walker (2 wheeled) Transfers: Sit to/from [facility] Sit to Stand: Min  assist Stand pivot transfers: Mod assist       General transfer comment: pt impulsively attempting to stand despite max cues and redirection.  Pt required assist for balance and to maneuver RW     Balance Overall balance assessment: Needs assistance   Sitting balance-Leahy Scale: Fair     Standing balance support: Bilateral upper extremity supported Standing balance-Leahy Scale: Poor Standing balance comment: requires mod A                            ADL either performed or assessed with clinical judgement   ADL Overall ADL's : Needs assistance/impaired Eating/Feeding: Moderate assistance;Sitting   Grooming: Wash/dry hands;Wash/dry face;Oral care;Brushing hair;Total assistance;Standing Grooming Details (indicate cue type and reason): Pt unable to sustain attention to engage  Upper Body Bathing: Total assistance;Sitting   Lower Body Bathing: Total assistance;Sit to/from stand   Upper Body Dressing : Total assistance;Sitting   Lower Body Dressing: Total assistance;Sit to/from stand   Toilet Transfer: Ambulation;Comfort height toilet;+2 for safety/equipment;Moderate assistance;Regular Toilet;BSC;Grab bars;RW   Toileting- Clothing Manipulation and Hygiene: Total assistance;Sit to/from stand       Functional mobility during ADLs: Moderate assistance;+2 for safety/equipment;Cueing for safety;Cueing for sequencing;Rolling walker General ADL Comments: Pt unable to sustain attention to complete in or engage in ADL tasks      Vision   Additional Comments: pt able to read orientation signs posted in his room      Perception  Perception Comments: Pt consistently running into the wall on the [place] tested?: Within functional limits    Pertinent Vitals/Pain Pain Assessment: Faces Faces Pain Scale: Hurts a little bit Pain Location: headache  Pain Descriptors / Indicators: Headache Pain Intervention(s): Monitored during session     Hand  Dominance Right   Extremity/Trunk Assessment Upper Extremity Assessment Upper Extremity Assessment: Overall WFL for tasks assessed   Lower Extremity Assessment Lower Extremity Assessment: Defer to PT evaluation   Cervical / Trunk Assessment Cervical / Trunk Assessment: Kyphotic   Communication Communication Communication: HOH   Cognition Arousal/Alertness: Awake/alert Behavior During Therapy: Restless;Agitated;Impulsive Overall Cognitive Status: Impaired/Different from baseline Area of Impairment: Orientation;Attention;Memory;Following commands;Safety/judgement;Awareness;Problem solving;Rancho level               Rancho Levels of Cognitive Functioning Rancho Los Amigos Scales of Cognitive Functioning: Confused/agitated Orientation Level: Disoriented to;Place;Time;Situation Current Attention Level: Focused Memory: Decreased short-term memory Following Commands: Follows one step commands inconsistently Safety/Judgement: Decreased awareness of safety;Decreased awareness of deficits   Problem Solving: Difficulty sequencing;Requires verbal cues;Requires tactile cues General Comments: Pt can state he is in the hospital, but repeatedly states he needs to go get the mail.  Pt reports seeing zuchini at the bottom of the bed and then attempts to go tend his garden.  He became increasingly impulsive escalating to agitation by end of session threatening to hit and kick staff and family.  When he was calm and focused, he was able to state that he was at [facility] hospital, he fell, and that he fractured his skull, but not able to retain or carry over that info.  Pt with no awareness of deficits.  While ambulating in hallway, pt consistently running RW into the wall on the Lt with no awareness and unable to correct    General Comments  daughter and wife present.  Began instruction re: TBI and associated deficits and behaviors.  Pt also on CIWA protoccol     Exercises     Shoulder Instructions       Home Living Family/patient expects to be discharged to:: Private residence Living Arrangements: Spouse/significant other Available Help at Discharge: Family Type of Home: House Home Access: Stairs to enter Technical brewer of Steps: 3 Entrance Stairs-Rails: Right Home Layout: One level     Bathroom Shower/Tub: Rosa Sanchez: Environmental consultant - 2 wheels;Other (comment);Shower seat   Additional Comments: walking stick; states has a chair he can put into the shower      Prior Functioning/Environment Level of Independence: Independent with assistive device(s)                 OT Problem List: Decreased strength;Decreased activity tolerance;Impaired balance (sitting and/or standing);Impaired vision/perception;Decreased cognition;Decreased safety awareness;Decreased knowledge of use of DME or AE;Pain      OT Treatment/Interventions: Self-care/ADL training;Neuromuscular education;DME and/or AE instruction;Therapeutic activities;Cognitive remediation/compensation;Visual/perceptual remediation/compensation;Patient/family education;Balance training    OT Goals(Current goals can be found in the care plan section) Acute Rehab OT Goals Patient Stated Goal: to get the mail  OT Goal Formulation: With family Time For Goal Achievement: 11/19/19 Potential to Achieve Goals: Good ADL Goals Pt Will Perform Grooming: with min assist;standing Pt Will Perform Upper Body Bathing: with set-up;with supervision;sitting Pt Will Perform Lower Body Bathing: with min assist;sit to/from stand Pt Will Perform Upper Body Dressing: with set-up;with supervision;sitting Pt Will Perform Lower Body Dressing: with min assist;sit to/from stand Pt Will Transfer to Toilet:  with min assist;ambulating;regular height toilet;bedside commode;grab bars Pt Will Perform Toileting - Clothing Manipulation and hygiene: with min assist;sit to/from stand Additional ADL Goal #1: Pt will sustain  attention to familiar ADL task x 5 mins with min cues Additional ADL Goal #2: Pt will follow one step commands consistently with no more than min cues  OT Frequency: Min 2X/week   Barriers to D/C: Decreased caregiver support  unsure level of assist wife able to provide        Co-evaluation              AM-PAC OT "6 Clicks" Daily Activity     Outcome Measure Help from another person eating meals?: A Lot Help from another person taking care of personal grooming?: Total Help from another person toileting, which includes using toliet, bedpan, or urinal?: Total Help from another person bathing (including washing, rinsing, drying)?: Total Help from another person to put on and taking off regular upper body clothing?: Total Help from another person to put on and taking off regular lower body clothing?: Total 6 Click Score: 7   End of Session Equipment Utilized During Treatment: Gait belt;Rolling walker Nurse Communication: Mobility status  Activity Tolerance: Treatment limited secondary to agitation Patient left: in bed;with call bell/phone within reach;with bed alarm set;with nursing/sitter in room;with family/visitor present;with restraints reapplied  OT Visit Diagnosis: Unsteadiness on feet (R26.81);Cognitive communication deficit (R41.841)                Time: 1554-1700 OT Time Calculation (min): 66 min Charges:  OT General Charges $OT Visit: 1 Visit OT Evaluation $OT Eval Moderate Complexity: 1 Mod OT Treatments $Self Care/Home Management : 8-22 mins $Therapeutic Activity: 38-52 mins  Nilsa Nutting., OTR/L Acute Rehabilitation Services Pager [phone] Office [phone]   Lucille Passy M 11/05/2019, 6:12 PM

## 2019-11-06 DIAGNOSIS — G9340 Encephalopathy, unspecified: Secondary | ICD-10-CM

## 2019-11-06 DIAGNOSIS — F1020 Alcohol dependence, uncomplicated: Secondary | ICD-10-CM

## 2019-11-06 LAB — COMPREHENSIVE METABOLIC PANEL
ALT: 36 U/L (ref 0–44)
AST: 31 U/L (ref 15–41)
Albumin: 3 g/dL — ABNORMAL LOW (ref 3.5–5.0)
Alkaline Phosphatase: 69 U/L (ref 38–126)
Anion gap: 8 (ref 5–15)
BUN: 9 mg/dL (ref 8–23)
CO2: 24 mmol/L (ref 22–32)
Calcium: 9 mg/dL (ref 8.9–10.3)
Chloride: 101 mmol/L (ref 98–111)
Creatinine, Ser: 0.85 mg/dL (ref 0.61–1.24)
GFR calc Af Amer: >60 mL/min (ref >60)
GFR calc non Af Amer: >60 mL/min (ref >60)
Glucose, Bld: 117 mg/dL — ABNORMAL HIGH (ref 70–99)
Potassium: 4.1 mmol/L (ref 3.5–5.1)
Sodium: 133 mmol/L — ABNORMAL LOW (ref 135–145)
Total Bilirubin: 1.3 mg/dL — ABNORMAL HIGH (ref 0.3–1.2)
Total Protein: 7 g/dL (ref 6.5–8.1)

## 2019-11-06 NOTE — Consult Note (Signed)
NAME:  William Gibson, MRN:  [id number], DOB:  [birth year], LOS: 1 ADMISSION DATE:  11/03/2019, CONSULTATION DATE:  11/06/19 REFERRING MD:  Hospitalist, CHIEF COMPLAINT:  ETOH withdrawal   Brief History    Mr. Trebilcock is a 74 y.o. M with PMH of ETOH abuse (over 5th whiskey per day), alcoholic liver disease, tobacco use, RA and HL who was drinking and had a fall striking his head with resulting SAH.  He was seen by neurosurgery with no surgical intervention indicated   He was admitted for observation and subsequently developed worsening ETOH withdrawal with DT's.  He received Librium and CIWA protocol throughout the day and had just fallen asleep at the time of consult.   Approximately 16mg  Ativan given in the last 8hrs.  Family states last drink was around 6pm 7/9 and patient has never gone through withdrawals as has never stopped drinking in the past.  No history of seizures.   Past Medical History   has a past medical history of Abnormal liver function tests (9/51/8841), Alcoholic liver disease ([place]) (12/15/2018), Alcoholism ([place]), Elevated LDL cholesterol level (05/22/2013), Epistaxis, Erectile dysfunction (05/22/2013), Frequent falls (11/09/2018), GERD (gastroesophageal reflux disease), Hyperammonemia ([place]) (11/15/2018), Memory change (08/17/2017), RA (rheumatoid arthritis) ([place]) (08/17/2017), RBBB (12/15/2018), Rheumatoid arthritis ([place]) (2010), Shuffling gait (12/19/2018), Tobacco abuse (04/26/2012), Weight loss, unintentional (11/09/2018), and 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   Significant Hospital Events   7/9 Admit to Neurosrugery 7/11 Worsening DT's, PCCM consult. Pt sleeping comfortably without HTN or tachycardia  Consults:  PCCM  Procedures:    Significant Diagnostic Tests:  7/9 CT head>>acute nondisplaced fracture is seen involving the occipital region of the skull, Mild-to-moderate amount of subarachnoid blood along the tentorium cerebelli, bilaterally 7/9 CT  C-spine>>no acute fx 7/10 CT head>>unchanged SAH and subdural  Micro Data:  7/10 MRSA screen>>negative 7/9 Sars-CoV-2>>negative  Antimicrobials:    Interim history/subjective:   11/06/2019 - remains on precedex gtt. On librium taper Calm. Not on vent. Not on pressors. Seen by NSGY Today.    Objective   Blood pressure 129/75, pulse 63, temperature 98.8 F (37.1 C), temperature source Oral, resp. rate 14, height 5\' 10"  (1.778 m), weight 74.8 kg, SpO2 96 %.        Intake/Output Summary (Last 24 hours) at 11/06/2019 1141 Last data filed at 11/06/2019 1100 Gross per 24 hour  Intake 299.56 ml  Output 1150 ml  Net -850.44 ml   Filed Weights   11/03/19 2035  Weight: 74.8 kg   General Appearance:  Looks unwell but no distress Head:  Normocephalic, without obvious abnormality, atraumatic Eyes:  PERRL - yes, conjunctiva/corneas - muddy     Ears:  Normal external ear canals, both ears Nose:  G tube - no Throat:  ETT TUBE - no , OG tube - no Neck:  Supple,  No enlargement/tenderness/nodules Lungs: Clear to auscultation bilaterally, Heart:  S1 and S2 normal, no murmur, CVP - no.  Pressors - no Abdomen:  Soft, no masses, no organomegaly. POSEY + Genitalia / Rectal:  Not done Extremities:  Extremities- intact Skin:  ntact in exposed areas . Sacral area - not examined Neurologic:  Sedation - precedex gtt -> RASS - -3/-4     Resolved Hospital Problem list     Assessment & Plan:   Alcohol abuse with acute withdrawal and DT"s and acute encephalopathy  11/06/2019 - RASS -3 on librium taper and precedex gtt with ativan prn  Plan  - dc librium  -  wean precedex gtt as tolerated for RASS goal 0 to -2 - ativan prn -Continue CIWA, , folic acid, MV and thiamine with close monitoring  Fall with Subarachnoid hemorrhage and basilar skull fracture Did not require surgical intervention and repeat head CT without worsening bleed, neurosurgery following   Best practice:  Diet:  NPO Pain/Anxiety/Delirium protocol (if indicated): CIWA VAP protocol (if indicated): n/a DVT prophylaxis: SCD's GI prophylaxis: n/a Glucose control: n/a Mobility: bed rest Code Status: full code Family Communication: wife updated - she is aware that he is back on precedex gtt Disposition: ICU      ATTESTATION & SIGNATURE   The patient OHN BOSTIC is critically ill with multiple organ systems failure and requires high complexity decision making for assessment and support, frequent evaluation and titration of therapies, application of advanced monitoring technologies and extensive interpretation of multiple databases.   Critical Care Time devoted to patient care services described in this note is  31  Minutes. This time reflects time of care of this signee Dr Brand Males. This critical care time does not reflect procedure time, or teaching time or supervisory time of PA/NP/Med student/Med Resident etc but could involve care discussion time     Dr. Brand Males, M.D., [facility].C.P Pulmonary and Critical Care Medicine Staff Physician [facility] Pager: [phone], If no answer or between  15:00h - 7:00h: call [phone]  11/06/2019 11:42 AM     LABS    PULMONARY No results for input(s): PHART, PCO2ART, PO2ART, HCO3, TCO2, O2SAT in the last 168 hours.  Invalid input(s): PCO2, PO2  CBC Recent Labs  Lab 11/03/19 2059 11/04/19 0225 11/05/19 0313  HGB 13.7 14.3 14.0  HCT 41.2 42.3 41.6  WBC 8.1 9.2 9.9  PLT 219 229 223    COAGULATION No results for input(s): INR in the last 168 hours.  CARDIAC  No results for input(s): TROPONINI in the last 168 hours. No results for input(s): PROBNP in the last 168 hours.   CHEMISTRY Recent Labs  Lab 11/03/19 2059 11/03/19 2059 11/04/19 0225 11/04/19 0225 11/05/19 0313 11/06/19 0406  NA 136  --  135  --  132* 133*  K 3.9   < > 4.4   < > 4.5 4.1  CL 103  --  103   --  100 101  CO2 21*  --  20*  --  24 24  GLUCOSE 120*  --  129*  --  141* 117*  BUN 7*  --  6*  --  6* 9  CREATININE 1.01  --  0.83  --  0.73 0.85  CALCIUM 8.9  --  8.8*  --  8.9 9.0  MG  --   --  1.6*  --  2.0  --   PHOS  --   --  3.4  --   --   --    < > = values in this interval not displayed.   Estimated Creatinine Clearance: 78.7 mL/min (by C-G formula based on SCr of 0.85 mg/dL).   LIVER Recent Labs  Lab 11/03/19 2059 11/04/19 0225 11/06/19 0406  AST 80* 69* 31  ALT 70* 66* 36  ALKPHOS 67 66 69  BILITOT 0.4 0.8 1.3*  PROT 7.1 7.2 7.0  ALBUMIN 3.2* 3.3* 3.0*     INFECTIOUS No results for input(s): LATICACIDVEN, PROCALCITON in the last 168 hours.   ENDOCRINE CBG (last 3)  No results for input(s):  GLUCAP in the last 72 hours.       IMAGING x48h  - image(s) personally visualized  -   highlighted in bold No results found.

## 2019-11-06 NOTE — Progress Notes (Signed)
Physical Therapy Treatment Patient Details Name: William Gibson MRN: [id number] DOB: 11-14-1945 Today's Date: 11/06/2019    History of Present Illness Patient is a 74 year old white male with a history of alcoholism, rheumatoid arthritis, alcoholic liver disease, tobacco use, previous CVA.  Patient was at home doing some work outside when he fell and hit the back of his head on the concrete driveway with positive LOC.  Head CT positive for subarachnoid hemorrhage and basilar skull fracture.    PT Comments    Patient progressing slowly, but still able to ambulate in hallway despite having to have increased sedation through the evening and transfer to ICU.  Supportive family in the room and pt motivated to improve.  Hard to ascertain deficits related to TBI versus withdrawal, but symptoms consistent with last session are running into obstacles on the R and anterior LOB.  Patient remains appropriate for CIR level rehab at d/c.  PT to follow.    Follow Up Recommendations  CIR     Equipment Recommendations  None recommended by PT    Recommendations for Other Services       Precautions / Restrictions      Mobility  Bed Mobility Overal bed mobility: Needs Assistance       Supine to sit: Mod assist     General bed mobility comments: assist for getting legs out on R side of bed, pt initiating to L  Transfers Overall transfer level: Needs assistance Equipment used: 2 person hand held assist;Rolling walker (2 wheeled) Transfers: Sit to/from Stand Sit to Stand: Min assist;+2 safety/equipment            Ambulation/Gait Ambulation/Gait assistance: Min assist;Mod assist;+2 safety/equipment Gait Distance (Feet): 100 Feet (& 15') Assistive device: Rolling walker (2 wheeled);2 person hand held assist Gait Pattern/deviations: Step-through pattern;Decreased stride length;Shuffle     General Gait Details: leaning forward; bilat HHA to bathroom with A for cords and lines; ambulating  in hallway with RW and assist for balance due to anterior bias and cues/assist due to hitting obstacles on R hand side; wearing his reading glasses thoughout but reports looking up over the top of them   Stairs             Wheelchair Mobility    Modified Rankin (Stroke Patients Only)       Balance Overall balance assessment: Needs assistance Sitting-balance support: Feet supported Sitting balance-Leahy Scale: Fair Sitting balance - Comments: leaning L sitting on toilet with close S and able to perform toilet hygiene with increased time and assist to get toilet tissue   Standing balance support: Bilateral upper extremity supported Standing balance-Leahy Scale: Poor Standing balance comment: A for balance with UE support                            Cognition Arousal/Alertness: Awake/alert Behavior During Therapy: Flat affect Overall Cognitive Status: Impaired/Different from baseline                 Rancho Levels of Cognitive Functioning Rancho [employer] Scales of Cognitive Functioning: Confused/inappropriate/non-agitated Orientation Level: Disoriented to;Time Current Attention Level: Sustained Memory: Decreased short-term memory Following Commands: Follows one step commands consistently;Follows one step commands with increased time Safety/Judgement: Decreased awareness of safety;Decreased awareness of deficits   Problem Solving: Slow processing;Requires verbal cues General Comments: slow processing, needed redirection getting out of bed on L when cued to get out on R and trying to enter another room in  hallway thinking it was his      Exercises      General Comments General comments (skin integrity, edema, etc.): Two daughters in the room and supportive helped him eat lunch and don glasses to try to read paper; RN discontinued Precedex drip prior to ambulation      Pertinent Vitals/Pain Faces Pain Scale: No hurt    Home Living                       Prior Function            PT Goals (current goals can now be found in the care plan section) Progress towards PT goals: Progressing toward goals    Frequency    Min 4X/week      PT Plan Current plan remains appropriate;Frequency needs to be updated    Co-evaluation              AM-PAC PT "6 Clicks" Mobility   Outcome Measure  Help needed turning from your back to your side while in a flat bed without using bedrails?: None Help needed moving from lying on your back to sitting on the side of a flat bed without using bedrails?: A Little Help needed moving to and from a bed to a chair (including a wheelchair)?: A Lot Help needed standing up from a chair using your arms (e.g., wheelchair or bedside chair)?: A Lot Help needed to walk in hospital room?: A Lot Help needed climbing 3-5 steps with a railing? : A Lot 6 Click Score: 15    End of Session Equipment Utilized During Treatment: Gait belt Activity Tolerance: Patient tolerated treatment well Patient left: in chair;with call bell/phone within reach;with restraints reapplied;with family/visitor present Nurse Communication: Other (comment) (posey on in chair, no chair alarm) PT Visit Diagnosis: Other abnormalities of gait and mobility (R26.89);History of falling (Z91.81);Other symptoms and signs involving the nervous system (R29.898)     Time: 1350-1413 PT Time Calculation (min) (ACUTE ONLY): 23 min  Charges:  $Gait Training: 8-22 mins $Therapeutic Activity: 8-22 mins                     Magda Kiel, PT Acute Rehabilitation Services [phone] [phone] 11/06/2019    William Gibson 11/06/2019, 4:57 PM

## 2019-11-06 NOTE — Progress Notes (Signed)
PROGRESS NOTE  William Gibson EXH:[id number] DOB: [birth year] DOA: 11/03/2019 PCP: Patient, No Pcp Per  HPI/Recap of past 24 hours: HPI from Dr Tonie Griffith William Gibson is a 74 year old white male with a history of alcoholism, rheumatoid arthritis, alcoholic liver disease, tobacco use, previous CVA. Reportedly patient was at home doing some work outside when he fell and hit the back of his head on the concrete driveway.  He had loss of consciousness that was prolonged at approximately 10 minutes. Pt has no memory of the event and could not provide any history. Wife reports that he drinks up to 1/5 of whiskey most days of the week.  She states that he has had tremors and been agitated in the past when he goes without alcohol for a few days.  She does not think he has ever been in DTs.  He has frequent falls at home with unsteady gait. In the emergency room patient was found to have a subarachnoid hemorrhage and basilar skull fracture in the occipital region on CT of his head.  Neurosurgery admitted patient, and [facility] were consulted.  Repeat CT head showed unchanged subarachnoid hemorrhage and neurosurgery had no further recommendation and asked [facility] hospitalist to take over to assist in managing alcohol abuse/withdrawal.    Last night, patient noted to be requiring about 14 mg of IV Ativan and subsequently required Precedex due to severe alcohol withdrawal/DTs.  Patient transferred to the ICU for further management.  This morning, patient noted to be mildly sedated, unable to perform ROS.   Assessment/Plan: Principal Problem:   Alcoholism ([place]) Active Problems:   Tobacco abuse   Subarachnoid hemorrhage ([place])   Skull fracture ([place])   Head injury   Fall at home, initial encounter   Loss of consciousness ([place])   Delirium tremens ([place])   Subarachnoid hemorrhage/basilar skull fracture status post mechanical fall Management by neurosurgery  Alcohol abuse/severe withdrawal/DTs Acute  metabolic encephalopathy Mild transaminitis Status post IV fluids PCCM consulted on 11/05/2019 CIWA protocol, Precedex as per PCCM Continue folic acid, thiamine, multivitamin Social worker consult for resources  Mechanical fall Likely secondary to above EKG with no acute ST changes, troponin negative PT/OT  Hypomagnesemia Replace as needed  Tobacco abuse Advised to quit Nicotine patch ordered       Malnutrition Type:      Malnutrition Characteristics:      Nutrition Interventions:       Estimated body mass index is 23.68 kg/m as calculated from the following:   Height as of this encounter: 5\' 10"  (1.778 m).   Weight as of this encounter: 74.8 kg.     Code Status: Full  Family Communication: Discussed with daughter at bedside on 11/05/19  Disposition Plan: Status is: Inpatient  The patient will require care spanning > 2 midnights and should be moved to inpatient because: Inpatient level of care appropriate due to severity of illness  Dispo: The patient is from: Home              Anticipated d/c is to: Home              Anticipated d/c date is: 2 days              Patient currently is not medically stable to d/c.    Consultants:  Neurosurgery  PCCM  Procedures:  None  Antimicrobials:  None  DVT prophylaxis: SCDs   Objective: Vitals:   11/06/19 0900 11/06/19 1000 11/06/19 1100 11/06/19 1200  BP: 119/79  129/75 119/75 121/79  Pulse: (!) 54 63  (!) 56  Resp: (!) 22 14 (!) 25 (!) 24  Temp:    99.4 F (37.4 C)  TempSrc:    Axillary  SpO2: 95% 96% 93% 93%  Weight:      Height:        Intake/Output Summary (Last 24 hours) at 11/06/2019 1548 Last data filed at 11/06/2019 1200 Gross per 24 hour  Intake 187.04 ml  Output 950 ml  Net -762.96 ml   Filed Weights   11/03/19 2035  Weight: 74.8 kg    Exam:  General: NAD, mildly sedated  Cardiovascular: S1, S2 present  Respiratory: CTAB  Abdomen: Soft, nontender, nondistended,  bowel sounds present  Musculoskeletal: No bilateral pedal edema noted  Skin: Normal  Psychiatry:  Unable to assess  Neurology: No obvious focal neurologic deficits noted    Data Reviewed: CBC: Recent Labs  Lab 11/03/19 2059 11/04/19 0225 11/05/19 0313  WBC 8.1 9.2 9.9  NEUTROABS  --   --  7.5  HGB 13.7 14.3 14.0  HCT 41.2 42.3 41.6  MCV 99.8 98.6 99.5  PLT 219 229 841   Basic Metabolic Panel: Recent Labs  Lab 11/03/19 2059 11/04/19 0225 11/05/19 0313 11/06/19 0406  NA 136 135 132* 133*  K 3.9 4.4 4.5 4.1  CL 103 103 100 101  CO2 21* 20* 24 24  GLUCOSE 120* 129* 141* 117*  BUN 7* 6* 6* 9  CREATININE 1.01 0.83 0.73 0.85  CALCIUM 8.9 8.8* 8.9 9.0  MG  --  1.6* 2.0  --   PHOS  --  3.4  --   --    GFR: Estimated Creatinine Clearance: 78.7 mL/min (by C-G formula based on SCr of 0.85 mg/dL). Liver Function Tests: Recent Labs  Lab 11/03/19 2059 11/04/19 0225 11/06/19 0406  AST 80* 69* 31  ALT 70* 66* 36  ALKPHOS 67 66 69  BILITOT 0.4 0.8 1.3*  PROT 7.1 7.2 7.0  ALBUMIN 3.2* 3.3* 3.0*   Recent Labs  Lab 11/03/19 2059  LIPASE 43   No results for input(s): AMMONIA in the last 168 hours. Coagulation Profile: No results for input(s): INR, PROTIME in the last 168 hours. Cardiac Enzymes: No results for input(s): CKTOTAL, CKMB, CKMBINDEX, TROPONINI in the last 168 hours. BNP (last 3 results) No results for input(s): PROBNP in the last 8760 hours. HbA1C: No results for input(s): HGBA1C in the last 72 hours. CBG: No results for input(s): GLUCAP in the last 168 hours. Lipid Profile: No results for input(s): CHOL, HDL, LDLCALC, TRIG, CHOLHDL, LDLDIRECT in the last 72 hours. Thyroid Function Tests: No results for input(s): TSH, T4TOTAL, FREET4, T3FREE, THYROIDAB in the last 72 hours. Anemia Panel: No results for input(s): VITAMINB12, FOLATE, FERRITIN, TIBC, IRON, RETICCTPCT in the last 72 hours. Urine analysis:    Component Value Date/Time   COLORURINE  YELLOW 11/04/2019 0935   APPEARANCEUR CLEAR 11/04/2019 0935   LABSPEC 1.013 11/04/2019 0935   PHURINE 7.0 11/04/2019 0935   GLUCOSEU NEGATIVE 11/04/2019 0935   GLUCOSEU NEGATIVE 11/10/2018 0917   HGBUR NEGATIVE 11/04/2019 0935   BILIRUBINUR NEGATIVE 11/04/2019 0935   KETONESUR NEGATIVE 11/04/2019 0935   PROTEINUR NEGATIVE 11/04/2019 0935   UROBILINOGEN 0.2 11/10/2018 0917   NITRITE NEGATIVE 11/04/2019 0935   LEUKOCYTESUR NEGATIVE 11/04/2019 0935   Sepsis Labs: @LABRCNTIP (procalcitonin:4,lacticidven:4)  ) Recent Results (from the past 240 hour(s))  SARS Coronavirus 2 by RT PCR (hospital order, performed in [facility] hospital lab) Nasopharyngeal  Nasopharyngeal Swab     Status: None   Collection Time: 11/03/19 10:38 PM   Specimen: Nasopharyngeal Swab  Result Value Ref Range Status   SARS Coronavirus 2 NEGATIVE NEGATIVE Final    Comment: (NOTE) SARS-CoV-2 target nucleic acids are NOT DETECTED.  The SARS-CoV-2 RNA is generally detectable in upper and lower respiratory specimens during the acute phase of infection. The lowest concentration of SARS-CoV-2 viral copies this assay can detect is 250 copies / mL. A negative result does not preclude SARS-CoV-2 infection and should not be used as the sole basis for treatment or other patient management decisions.  A negative result may occur with improper specimen collection / handling, submission of specimen other than nasopharyngeal swab, presence of viral mutation(s) within the areas targeted by this assay, and inadequate number of viral copies (<250 copies / mL). A negative result must be combined with clinical observations, patient history, and epidemiological information.  Fact Sheet for Patients:   StrictlyIdeas.no  Fact Sheet for Healthcare Providers: BankingDealers.co.za  This test is not yet approved or  cleared by the [place] FDA and has been authorized for detection  and/or diagnosis of SARS-CoV-2 by FDA under an Emergency Use Authorization (EUA).  This EUA will remain in effect (meaning this test can be used) for the duration of the COVID-19 declaration under Section 564(b)(1) of the Act, 21 U.S.C. section 360bbb-3(b)(1), unless the authorization is terminated or revoked sooner.  Performed at [facility], [place] [address] [place], [place] [address]   MRSA PCR Screening     Status: None   Collection Time: 11/04/19  3:01 PM   Specimen: Nasopharyngeal Wash  Result Value Ref Range Status   MRSA by PCR NEGATIVE NEGATIVE Final    Comment:        The GeneXpert MRSA Assay (FDA approved for NASAL specimens only), is one component of a comprehensive MRSA colonization surveillance program. It is not intended to diagnose MRSA infection nor to guide or monitor treatment for MRSA infections. Performed at [facility], [place] [address] [place], [place] [address]       Studies: No results found.  Scheduled Meds: . Chlorhexidine Gluconate Cloth  6 each Topical Daily  . folic acid  1 mg Oral Daily  . multivitamin with minerals  1 tablet Oral Daily  . nicotine  21 mg Transdermal Daily  . pantoprazole  40 mg Oral Daily  . sodium chloride flush  3 mL Intravenous Q12H  . thiamine  100 mg Oral Daily   Or  . thiamine  100 mg Intravenous Daily    Continuous Infusions: . dexmedetomidine (PRECEDEX) IV infusion 0.4 mcg/kg/hr (11/06/19 1200)     LOS: 1 day     Alma Friendly, MD [facility]  If 7PM-7AM, please contact night-coverage [email or web address] 11/06/2019, 3:48 PM

## 2019-11-06 NOTE — Progress Notes (Signed)
Subjective: Patient is lethargic and going through ETOH withdrawal. He began having severe ETOH withdrawal last night with a CIWA score of 26 and requiring 5 point restraints and 4 mg of Ativan. He was subsequently transferred to the [facility] ICU for a Precedex gtt and closer monitoring. This morning the Precedex gtt was recently turned off and the patient is lethargic and disoriented.   Objective: Vital signs in last 24 hours: Temp:  [97.4 F (36.3 C)-99.2 F (37.3 C)] 98.8 F (37.1 C) (07/12 0800) Pulse Rate:  [54-105] 54 (07/12 0700) Resp:  [15-30] 19 (07/12 0700) BP: (109-168)/(74-123) 130/80 (07/12 0700) SpO2:  [93 %-100 %] 95 % (07/12 0700)  Intake/Output from previous day: 07/11 0701 - 07/12 0700 In: 164.3 [P.O.:120; I.V.:44.3] Out: 1450 [Urine:1450] Intake/Output this shift: No intake/output data recorded.  Physical Exam: Patient is lethargic and arouses to voice and follows simple commands. His is dysarthric and is oriented to self only. PERRL 68mm brisk. MAEW. 5 pont restraints in place for patient and staff safety.   Lab Results: Recent Labs    11/04/19 0225 11/05/19 0313  WBC 9.2 9.9  HGB 14.3 14.0  HCT 42.3 41.6  PLT 229 223   BMET Recent Labs    11/05/19 0313 11/06/19 0406  NA 132* 133*  K 4.5 4.1  CL 100 101  CO2 24 24  GLUCOSE 141* 117*  BUN 6* 9  CREATININE 0.73 0.85  CALCIUM 8.9 9.0    Studies/Results: No results found.  Assessment/Plan: Continue CIWA protocol and treat patients withdrawal symptoms as appropriate. Continue supportive care. Continue working with therapies when appropriate. There are no new neurosurgical recommendations at this time. Call with any questions.     LOS: 1 day    Peggyann Shoals, MD 11/06/2019, 8:55 AM

## 2019-11-07 ENCOUNTER — Inpatient Hospital Stay (HOSPITAL_COMMUNITY): Payer: 59

## 2019-11-07 LAB — CBC
HCT: 43.5 % (ref 39.0–52.0)
Hemoglobin: 14.6 g/dL (ref 13.0–17.0)
MCH: 33.2 pg (ref 26.0–34.0)
MCHC: 33.6 g/dL (ref 30.0–36.0)
MCV: 98.9 fL (ref 80.0–100.0)
Platelets: 209 10*3/uL (ref 150–400)
RBC: 4.4 MIL/uL (ref 4.22–5.81)
RDW: 12.3 % (ref 11.5–15.5)
WBC: 9.7 10*3/uL (ref 4.0–10.5)
nRBC: 0 % (ref 0.0–0.2)

## 2019-11-07 LAB — COMPREHENSIVE METABOLIC PANEL
ALT: 41 U/L (ref 0–44)
AST: 42 U/L — ABNORMAL HIGH (ref 15–41)
Albumin: 3 g/dL — ABNORMAL LOW (ref 3.5–5.0)
Alkaline Phosphatase: 67 U/L (ref 38–126)
Anion gap: 10 (ref 5–15)
BUN: 14 mg/dL (ref 8–23)
CO2: 22 mmol/L (ref 22–32)
Calcium: 9.3 mg/dL (ref 8.9–10.3)
Chloride: 102 mmol/L (ref 98–111)
Creatinine, Ser: 0.81 mg/dL (ref 0.61–1.24)
GFR calc Af Amer: >60 mL/min (ref >60)
GFR calc non Af Amer: >60 mL/min (ref >60)
Glucose, Bld: 103 mg/dL — ABNORMAL HIGH (ref 70–99)
Potassium: 4.1 mmol/L (ref 3.5–5.1)
Sodium: 134 mmol/L — ABNORMAL LOW (ref 135–145)
Total Bilirubin: 1.2 mg/dL (ref 0.3–1.2)
Total Protein: 7.3 g/dL (ref 6.5–8.1)

## 2019-11-07 LAB — PROCALCITONIN: Procalcitonin: <0.1 ng/mL

## 2019-11-07 LAB — PHOSPHORUS: Phosphorus: 3.1 mg/dL (ref 2.5–4.6)

## 2019-11-07 LAB — VITAMIN B12: Vitamin B-12: 1185 pg/mL — ABNORMAL HIGH (ref 180–914)

## 2019-11-07 LAB — MAGNESIUM: Magnesium: 1.7 mg/dL (ref 1.7–2.4)

## 2019-11-07 MED ORDER — DEXTROSE IN LACTATED RINGERS 5 % IV SOLN: INTRAVENOUS | Status: DC

## 2019-11-07 MED ORDER — FOLIC ACID 1 MG PO TABS
1.0000 mg | ORAL_TABLET | Freq: Every day | ORAL | Status: DC
  Administered 2019-11-08 – 2019-11-09 (×2): 1 mg via ORAL
  Filled 2019-11-07 (×3): qty 1

## 2019-11-07 MED ORDER — ORAL CARE MOUTH RINSE
15.0000 mL | Freq: Two times a day (BID) | OROMUCOSAL | Status: DC
  Administered 2019-11-07 – 2019-11-10 (×7): 15 mL via OROMUCOSAL

## 2019-11-07 MED ORDER — LORAZEPAM 2 MG/ML IJ SOLN: 1.0000 mg | INTRAMUSCULAR | Status: AC | PRN

## 2019-11-07 MED ORDER — FOLIC ACID 5 MG/ML IJ SOLN
1.0000 mg | Freq: Every day | INTRAMUSCULAR | Status: DC
  Administered 2019-11-10: 1 mg via INTRAVENOUS
  Filled 2019-11-07 (×3): qty 0.2

## 2019-11-07 MED ORDER — LORAZEPAM 1 MG PO TABS
1.0000 mg | ORAL_TABLET | ORAL | Status: AC | PRN
  Administered 2019-11-07: 1 mg via ORAL
  Filled 2019-11-07: qty 1

## 2019-11-07 MED ORDER — FOLIC ACID 5 MG/ML IJ SOLN: 1.0000 mg | Freq: Every day | INTRAMUSCULAR | Status: DC

## 2019-11-07 MED ORDER — RESOURCE THICKENUP CLEAR PO POWD
ORAL | Status: DC | PRN
  Filled 2019-11-07: qty 125

## 2019-11-07 MED ORDER — MAGNESIUM SULFATE 4 GM/100ML IV SOLN
4.0000 g | Freq: Once | INTRAVENOUS | Status: AC
  Administered 2019-11-07: 4 g via INTRAVENOUS
  Filled 2019-11-07: qty 100

## 2019-11-07 NOTE — Progress Notes (Signed)
PROGRESS NOTE  William Gibson ION:[id number] DOB: 06/09/1945 DOA: 11/03/2019 PCP: Patient, No Pcp Per  HPI/Recap of past 24 hours: HPI from Dr Tonie Griffith William Gibson is a 74 year old white male with a history of alcoholism, rheumatoid arthritis, alcoholic liver disease, tobacco use, previous CVA. Reportedly patient was at home doing some work outside when he fell and hit the back of his head on the concrete driveway.  He had loss of consciousness that was prolonged at approximately 10 minutes. Pt has no memory of the event and could not provide any history. Wife reports that he drinks up to 1/5 of whiskey most days of the week.  She states that he has had tremors and been agitated in the past when he goes without alcohol for a few days.  She does not think he has ever been in DTs.  He has frequent falls at home with unsteady gait. In the emergency room patient was found to have a subarachnoid hemorrhage and basilar skull fracture in the occipital region on CT of his head.  Neurosurgery admitted patient, and [facility] were consulted.  Repeat CT head showed unchanged subarachnoid hemorrhage and neurosurgery had no further recommendation and asked [facility] hospitalist to take over to assist in managing alcohol abuse/withdrawal.    Today, patient still disoriented, still on Precedex drip.  Unable to perform ROS.  Discussed with Dr. Chase Caller, PCCM will take over patient, [facility] hospitalist will sign out on 11/07/2019   Assessment/Plan: Principal Problem:   Alcoholism [facility]) Active Problems:   Tobacco abuse   Subarachnoid hemorrhage ([place])   Skull fracture ([place])   Head injury   Fall at home, initial encounter   Loss of consciousness ([place])   Delirium tremens ([place])   Subarachnoid hemorrhage/basilar skull fracture status post mechanical fall Management by neurosurgery  Alcohol abuse/severe withdrawal/DTs Acute metabolic encephalopathy Mild transaminitis Status post IV fluids PCCM  consulted on 11/05/2019 CIWA protocol, Precedex as per PCCM Continue folic acid, thiamine, multivitamin Social worker consult for resources  Mechanical fall Likely secondary to above EKG with no acute ST changes, troponin negative PT/OT  Hypomagnesemia Replace as needed  Tobacco abuse Advised to quit Nicotine patch ordered       Malnutrition Type:      Malnutrition Characteristics:      Nutrition Interventions:       Estimated body mass index is 23.68 kg/m as calculated from the following:   Height as of this encounter: 5\' 10"  (1.778 m).   Weight as of this encounter: 74.8 kg.     Code Status: Full  Family Communication: Discussed with daughter at bedside on 11/05/19  Disposition Plan: Status is: Inpatient  The patient will require care spanning > 2 midnights and should be moved to inpatient because: Inpatient level of care appropriate due to severity of illness  Dispo: The patient is from: Home              Anticipated d/c is to: Home              Anticipated d/c date is: 2 days              Patient currently is not medically stable to d/c.    Consultants:  Neurosurgery  PCCM  Procedures:  None  Antimicrobials:  None  DVT prophylaxis: SCDs   Objective: Vitals:   11/07/19 1300 11/07/19 1400 11/07/19 1500 11/07/19 1600  BP: 122/76 103/85 116/75 126/80  Pulse: 65 66 89 70  Resp: 17 17 18  19  Temp:    98 F (36.7 C)  TempSrc:    Axillary  SpO2: 95% 97% 90% 94%  Weight:      Height:        Intake/Output Summary (Last 24 hours) at 11/07/2019 1633 Last data filed at 11/07/2019 1600 Gross per 24 hour  Intake 288.1 ml  Output 1000 ml  Net -711.9 ml   Filed Weights   11/03/19 2035  Weight: 74.8 kg    Exam:  General: NAD, disoriented  Cardiovascular: S1, S2 present  Respiratory: CTAB  Abdomen: Soft, nontender, nondistended, bowel sounds present  Musculoskeletal: No bilateral pedal edema noted  Skin:  Normal  Psychiatry:  Unable to assess  Neurology: No obvious focal neurologic deficits noted    Data Reviewed: CBC: Recent Labs  Lab 11/03/19 2059 11/04/19 0225 11/05/19 0313 11/07/19 0337  WBC 8.1 9.2 9.9 9.7  NEUTROABS  --   --  7.5  --   HGB 13.7 14.3 14.0 14.6  HCT 41.2 42.3 41.6 43.5  MCV 99.8 98.6 99.5 98.9  PLT 219 229 223 222   Basic Metabolic Panel: Recent Labs  Lab 11/03/19 2059 11/04/19 0225 11/05/19 0313 11/06/19 0406 11/07/19 0337  NA 136 135 132* 133* 134*  K 3.9 4.4 4.5 4.1 4.1  CL 103 103 100 101 102  CO2 21* 20* 24 24 22   GLUCOSE 120* 129* 141* 117* 103*  BUN 7* 6* 6* 9 14  CREATININE 1.01 0.83 0.73 0.85 0.81  CALCIUM 8.9 8.8* 8.9 9.0 9.3  MG  --  1.6* 2.0  --  1.7  PHOS  --  3.4  --   --  3.1   GFR: Estimated Creatinine Clearance: 82.6 mL/min (by C-G formula based on SCr of 0.81 mg/dL). Liver Function Tests: Recent Labs  Lab 11/03/19 2059 11/04/19 0225 11/06/19 0406 11/07/19 0337  AST 80* 69* 31 42*  ALT 70* 66* 36 41  ALKPHOS 67 66 69 67  BILITOT 0.4 0.8 1.3* 1.2  PROT 7.1 7.2 7.0 7.3  ALBUMIN 3.2* 3.3* 3.0* 3.0*   Recent Labs  Lab 11/03/19 2059  LIPASE 43   No results for input(s): AMMONIA in the last 168 hours. Coagulation Profile: No results for input(s): INR, PROTIME in the last 168 hours. Cardiac Enzymes: No results for input(s): CKTOTAL, CKMB, CKMBINDEX, TROPONINI in the last 168 hours. BNP (last 3 results) No results for input(s): PROBNP in the last 8760 hours. HbA1C: No results for input(s): HGBA1C in the last 72 hours. CBG: No results for input(s): GLUCAP in the last 168 hours. Lipid Profile: No results for input(s): CHOL, HDL, LDLCALC, TRIG, CHOLHDL, LDLDIRECT in the last 72 hours. Thyroid Function Tests: No results for input(s): TSH, T4TOTAL, FREET4, T3FREE, THYROIDAB in the last 72 hours. Anemia Panel: Recent Labs    11/07/19 0337  VITAMINB12 1,185*   Urine analysis:    Component Value Date/Time    COLORURINE YELLOW 11/04/2019 0935   APPEARANCEUR CLEAR 11/04/2019 0935   LABSPEC 1.013 11/04/2019 0935   PHURINE 7.0 11/04/2019 0935   GLUCOSEU NEGATIVE 11/04/2019 0935   GLUCOSEU NEGATIVE 11/10/2018 0917   HGBUR NEGATIVE 11/04/2019 0935   BILIRUBINUR NEGATIVE 11/04/2019 0935   KETONESUR NEGATIVE 11/04/2019 0935   PROTEINUR NEGATIVE 11/04/2019 0935   UROBILINOGEN 0.2 11/10/2018 0917   NITRITE NEGATIVE 11/04/2019 0935   LEUKOCYTESUR NEGATIVE 11/04/2019 0935   Sepsis Labs: @LABRCNTIP (procalcitonin:4,lacticidven:4)  ) Recent Results (from the past 240 hour(s))  SARS Coronavirus 2 by RT PCR (hospital order,  performed in [facility] hospital lab) Nasopharyngeal Nasopharyngeal Swab     Status: None   Collection Time: 11/03/19 10:38 PM   Specimen: Nasopharyngeal Swab  Result Value Ref Range Status   SARS Coronavirus 2 NEGATIVE NEGATIVE Final    Comment: (NOTE) SARS-CoV-2 target nucleic acids are NOT DETECTED.  The SARS-CoV-2 RNA is generally detectable in upper and lower respiratory specimens during the acute phase of infection. The lowest concentration of SARS-CoV-2 viral copies this assay can detect is 250 copies / mL. A negative result does not preclude SARS-CoV-2 infection and should not be used as the sole basis for treatment or other patient management decisions.  A negative result may occur with improper specimen collection / handling, submission of specimen other than nasopharyngeal swab, presence of viral mutation(s) within the areas targeted by this assay, and inadequate number of viral copies (<250 copies / mL). A negative result must be combined with clinical observations, patient history, and epidemiological information.  Fact Sheet for Patients:   StrictlyIdeas.no  Fact Sheet for Healthcare Providers: BankingDealers.co.za  This test is not yet approved or  cleared by the [place] FDA and has been authorized for  detection and/or diagnosis of SARS-CoV-2 by FDA under an Emergency Use Authorization (EUA).  This EUA will remain in effect (meaning this test can be used) for the duration of the COVID-19 declaration under Section 564(b)(1) of the Act, 21 U.S.C. section 360bbb-3(b)(1), unless the authorization is terminated or revoked sooner.  Performed at [facility], [place] [address], [place], [place] [address]   MRSA PCR Screening     Status: None   Collection Time: 11/04/19  3:01 PM   Specimen: Nasopharyngeal Wash  Result Value Ref Range Status   MRSA by PCR NEGATIVE NEGATIVE Final    Comment:        The GeneXpert MRSA Assay (FDA approved for NASAL specimens only), is one component of a comprehensive MRSA colonization surveillance program. It is not intended to diagnose MRSA infection nor to guide or monitor treatment for MRSA infections. Performed at [facility], [place] [address] [place], [place] [address]       Studies: No results found.  Scheduled Meds: . Chlorhexidine Gluconate Cloth  6 each Topical Daily  . [START ON 8/34/1962] folic acid  1 mg Oral Daily   Or  . [START ON 2/29/7989] folic acid  1 mg Intravenous Daily  . mouth rinse  15 mL Mouth Rinse BID  . multivitamin with minerals  1 tablet Oral Daily  . nicotine  21 mg Transdermal Daily  . pantoprazole  40 mg Oral Daily  . sodium chloride flush  3 mL Intravenous Q12H  . thiamine  100 mg Oral Daily   Or  . thiamine  100 mg Intravenous Daily    Continuous Infusions: . dexmedetomidine (PRECEDEX) IV infusion Stopped (11/07/19 1141)  . dextrose 5% lactated ringers 50 mL/hr at 11/07/19 1600     LOS: 2 days     Alma Friendly, MD [facility]  If 7PM-7AM, please contact night-coverage [email or web address] 11/07/2019, 4:33 PM

## 2019-11-07 NOTE — Consult Note (Signed)
NAME:  William Gibson, MRN:  [id number], DOB:  [birth year], LOS: 2 ADMISSION DATE:  11/03/2019, CONSULTATION DATE:  11/07/19 REFERRING MD:  Hospitalist, CHIEF COMPLAINT:  ETOH withdrawal   Brief History    William Gibson is a 74 y.o. M with PMH of ETOH abuse (over 5th whiskey per day), alcoholic liver disease, tobacco use, RA and HL who was drinking and had a fall striking his head with resulting SAH.  He was seen by neurosurgery with no surgical intervention indicated   He was admitted for observation and subsequently developed worsening ETOH withdrawal with DT's.  He received Librium and CIWA protocol throughout the day and had just fallen asleep at the time of consult.   Approximately 16mg  Ativan given in the last 8hrs.  Family states last drink was around 6pm 7/9 and patient has never gone through withdrawals as has never stopped drinking in the past.  No history of seizures.   Past Medical History   has a past medical history of Abnormal liver function tests (7/61/9509), Alcoholic liver disease ([place]) (12/15/2018), Alcoholism ([place]), Elevated LDL cholesterol level (05/22/2013), Epistaxis, Erectile dysfunction (05/22/2013), Frequent falls (11/09/2018), GERD (gastroesophageal reflux disease), Hyperammonemia ([place]) (11/15/2018), Memory change (08/17/2017), RA (rheumatoid arthritis) ([place]) (08/17/2017), RBBB (12/15/2018), Rheumatoid arthritis ([place]) (2010), Shuffling gait (12/19/2018), Tobacco abuse (04/26/2012), Weight loss, unintentional (11/09/2018), and 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   Significant Hospital Events   7/9 Admit to Neurosrugery 7/11 Worsening DT's, PCCM consult. Pt sleeping comfortably without HTN or tachycardia 7.12  - remains on precedex gtt. On librium taper Calm. Not on vent. Not on pressors. Seen by NSGY Today.    Consults:  PCCM  Procedures:    Significant Diagnostic Tests:  7/9 CT head>>acute nondisplaced fracture is seen involving the occipital region of the  skull, Mild-to-moderate amount of subarachnoid blood along the tentorium cerebelli, bilaterally 7/9 CT C-spine>>no acute fx 7/10 CT head>>unchanged SAH and subdural  Micro Data:  7/10 MRSA screen>>negative 7/9 Sars-CoV-2>>negative  Antimicrobials:    Interim history/subjective:   11/07/2019 - off librium taper x 24h . Was able to walk without precedex gtt. Off precedex -> oriented x 4 and then starts hallucinating needing precedex -> then deep sleep per RN. Only ativan x 2 overnight in past 24h. Improved need for ativan rescue. Per RN -> family was worried that pre-admit was weak with and losing balance that resulted in fall  Objective   Blood pressure 121/81, pulse (!) 55, temperature 98.9 F (37.2 C), temperature source Axillary, resp. rate 17, height 5\' 10"  (1.778 m), weight 74.8 kg, SpO2 95 %.        Intake/Output Summary (Last 24 hours) at 11/07/2019 3267 Last data filed at 11/07/2019 0600 Gross per 24 hour  Intake 221.5 ml  Output 1050 ml  Net -828.5 ml   Filed Weights   11/03/19 2035  Weight: 74.8 kg     General Appearance:  Sleeping with posey on precedex Head:  Normocephalic, without obvious abnormality, atraumatic Eyes:  PERRL - yes, conjunctiva/corneas - muddy     Ears:  Normal external ear canals, both ears Nose:  G tube - n Throat:  ETT TUBE - no , OG tube - no Neck:  Supple,  No enlargement/tenderness/nodules Lungs: Clear to auscultation bilaterally,  Heart:  S1 and S2 normal, no murmur, CVP - no.  Pressors - no Abdomen:  Soft, no masses, no organomegaly Genitalia / Rectal:  Not done Extremities:  Extremities- intacyt Skin:  ntact  in exposed areas . Sacral area - not examined Neurologic:  Sedation - precedex gtt -> RASS -3   Resolved Hospital Problem list     Assessment & Plan:   Alcohol abuse with acute withdrawal and DT"s and acute encephalopathy  11/07/2019 - RASS -3 on precedex gtt with ativan prn. Improved need for ativan. Off precedex can be  oriented and walk and then  hallucinaes  Plan  - wean precedex gtt as tolerated for RASS goal 0 to -2 - ativan prn -Continue CIWA, , folic acid, MV and thiamine with close monitoring  Fall with Subarachnoid hemorrhage and basilar skull fracture - Did not require surgical intervention and repeat head CT without worsening bleed, neurosurgery following  11/07/2019 - neuro intact when not having etoh withdrawal hallucination  Plan  - per NSGY  Electrolyte imbalance  11/07/2019 - Mag < 2gm%  Plan  - replete mag  Hx of RA - RF stronly positive 2016 and Jan 2020 . On Meloxicam. Not on DMARD. Wife says RA is affectiving its pain  7/13 - > no pain.   Plan  - continue to hold home meloxicam   - can do plaquenil when needed for pain   Frequent Falls prior to admit x few weeks with shuffling feet  11/07/2019 - no arrhthymias per RN. Might not be related to RA   Plan  - needs eval opd - needs PT/OT -> mgith inpatient rehab/SNF - might need opd neuro eval   Best practice:  Diet: NPO but eat as tolerated. STart D5 LR on 11/07/19  Pain/Anxiety/Delirium protocol (if indicated): CIWA  VAP protocol (if indicated): n/a  DVT prophylaxis: SCD's  GI prophylaxis: n/a  Glucose control: n/a  Mobility: bed rest  Code Status: full code  Family Communication:  -  wife updated  7/12 and 7/13 - says she is in emotional distress due to husband's situation and is reminiscent of what happened to her dad. Explained overall course and current situation. Various topics discussed -> she had concerns over precedex safety > 48h. Explained William Gibson study showing safety over 28 days and also the off label use in etoh wd.   Disposition: ICU     ATTESTATION & SIGNATURE   The patient William Gibson is critically ill with multiple organ systems failure and requires high complexity decision making for assessment and support, frequent evaluation and titration of therapies, application of advanced  monitoring technologies and extensive interpretation of multiple databases.   Critical Care Time devoted to patient care services described in this note is  40  Minutes. This time reflects time of care of this signee Dr Brand Males. This critical care time does not reflect procedure time, or teaching time or supervisory time of PA/NP/Med student/Med Resident etc but could involve care discussion time     Dr. Brand Males, M.D., [facility].C.P Pulmonary and Critical Care Medicine Staff Physician [facility] Pager: [phone], If no answer or between  15:00h - 7:00h: call [phone]  11/07/2019 9:55 AM    LABS    PULMONARY No results for input(s): PHART, PCO2ART, PO2ART, HCO3, TCO2, O2SAT in the last 168 hours.  Invalid input(s): PCO2, PO2  CBC Recent Labs  Lab 11/04/19 0225 11/05/19 0313 11/07/19 0337  HGB 14.3 14.0 14.6  HCT 42.3 41.6 43.5  WBC 9.2 9.9 9.7  PLT 229 223 209    COAGULATION No results for input(s): INR in the last  168 hours.  CARDIAC  No results for input(s): TROPONINI in the last 168 hours. No results for input(s): PROBNP in the last 168 hours.   CHEMISTRY Recent Labs  Lab 11/03/19 2059 11/03/19 2059 11/04/19 0225 11/04/19 0225 11/05/19 0313 11/05/19 0313 11/06/19 0406 11/07/19 0337  NA 136  --  135  --  132*  --  133* 134*  K 3.9   < > 4.4   < > 4.5   < > 4.1 4.1  CL 103  --  103  --  100  --  101 102  CO2 21*  --  20*  --  24  --  24 22  GLUCOSE 120*  --  129*  --  141*  --  117* 103*  BUN 7*  --  6*  --  6*  --  9 14  CREATININE 1.01  --  0.83  --  0.73  --  0.85 0.81  CALCIUM 8.9  --  8.8*  --  8.9  --  9.0 9.3  MG  --   --  1.6*  --  2.0  --   --  1.7  PHOS  --   --  3.4  --   --   --   --  3.1   < > = values in this interval not displayed.   Estimated Creatinine Clearance: 82.6 mL/min (by C-G formula based on SCr of 0.81 mg/dL).   LIVER Recent Labs  Lab 11/03/19 2059  11/04/19 0225 11/06/19 0406 11/07/19 0337  AST 80* 69* 31 42*  ALT 70* 66* 36 41  ALKPHOS 67 66 69 67  BILITOT 0.4 0.8 1.3* 1.2  PROT 7.1 7.2 7.0 7.3  ALBUMIN 3.2* 3.3* 3.0* 3.0*     INFECTIOUS No results for input(s): LATICACIDVEN, PROCALCITON in the last 168 hours.   ENDOCRINE CBG (last 3)  No results for input(s): GLUCAP in the last 72 hours.       IMAGING x48h  - image(s) personally visualized  -   highlighted in bold No results found.

## 2019-11-07 NOTE — Evaluation (Signed)
Speech Language Pathology Evaluation Patient Details Name: William Gibson MRN: [id number] DOB: 12-12-1945 Today's Date: 11/07/2019 Time: 0017-4944 SLP Time Calculation (min) (ACUTE ONLY): 19 min  Problem List:  Patient Active Problem List   Diagnosis Date Noted  . Delirium tremens ([place])   . Subarachnoid hemorrhage ([place]) 11/04/2019  . Skull fracture ([place]) 11/04/2019  . Head injury 11/04/2019  . Fall at home, initial encounter 11/04/2019  . Loss of consciousness ([place]) 11/04/2019  . Hyperlipidemia 02/06/2019  . Shuffling gait 12/19/2018  . Abnormal liver function tests 12/15/2018  . Alcoholic liver disease ([place]) 12/15/2018  . RBBB 12/15/2018  . 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 12/15/2018  . Hyperammonemia ([place]) 11/15/2018  . Weight loss, unintentional 11/09/2018  . Frequent falls 11/09/2018  . RA (rheumatoid arthritis) ([place]) 08/17/2017  . Memory change 08/17/2017  . Alcoholism ([place]) 12/15/2016  . Osteoarthritis 01/30/2016  . GERD (gastroesophageal reflux disease) 05/23/2014  . Elevated LDL cholesterol level 05/22/2013  . Erectile dysfunction 05/22/2013  . Tobacco abuse 04/26/2012   Past Medical History:  Past Medical History:  Diagnosis Date  . Abnormal liver function tests 12/15/2018  . Alcoholic liver disease ([place]) 12/15/2018  . Alcoholism ([place])   . Elevated LDL cholesterol level 05/22/2013  . Epistaxis   . Erectile dysfunction 05/22/2013  . Frequent falls 11/09/2018  . GERD (gastroesophageal reflux disease)   . Hyperammonemia ([place]) 11/15/2018  . Memory change 08/17/2017  . RA (rheumatoid arthritis) ([place]) 08/17/2017  . RBBB 12/15/2018  . Rheumatoid arthritis ([place]) 2010  . Shuffling gait 12/19/2018  . Tobacco abuse 04/26/2012  . Weight loss, unintentional 11/09/2018  . 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 12/15/2018   Past Surgical History:  Past Surgical History:  Procedure Laterality Date  . NASAL ENDOSCOPY WITH EPISTAXIS CONTROL N/A 04/12/2018    Procedure: NASAL ENDOSCOPY WITH EPISTAXIS CONTROL;  Surgeon: Melissa Montane, MD;  Location: [facility];  Service: ENT;  Laterality: N/A;  . VASECTOMY    . VASECTOMY REVERSAL     HPI:  Patient is a 74 year old white male with a history of alcoholism, rheumatoid arthritis, alcoholic liver disease, tobacco use, previous CVA, GERD.  Patient was at home doing some work outside when he fell and hit the back of his head on the concrete driveway with positive LOC.  Head CT positive for subarachnoid hemorrhage and basilar skull fracture.   Assessment / Plan / Recommendation Clinical Impression  Pt was drowsy but participatory throughout evaluation. He need cues to open his eyes to attend to all sides of his environment. He is oriented x4 with Min cues for verbal problem solving. His sustained attention and safety awareness are impaired, with pt also demonstrating impulsivity at times given his decreased awareness of deficits. His speech is soft and imprecise; improved with cues to increase his volume. Intelligibility is impacted at the word to short-phrase level. Recommend ongoing cognitive and communicative tx with additional, more standardized testing recommended as pt moves out of this more acute phase in which he is also under CIWA protocol.    SLP Assessment  SLP Recommendation/Assessment: Patient needs continued Speech Lanaguage Pathology Services SLP Visit Diagnosis: Cognitive communication deficit (R41.841)    Follow Up Recommendations  Inpatient Rehab    Frequency and Duration min 2x/week  2 weeks      SLP Evaluation Cognition  Overall Cognitive Status: Impaired/Different from baseline Arousal/Alertness: Lethargic Orientation Level: Oriented X4 Attention: Sustained Sustained Attention: Impaired Sustained Attention Impairment: Verbal basic Awareness: Impaired Awareness  Impairment: Intellectual impairment;Emergent impairment;Anticipatory impairment Problem Solving: Impaired Problem Solving  Impairment: Functional basic Behaviors: Impulsive Safety/Judgment: Impaired Rancho Los Amigos Scales of Cognitive Functioning: Confused/inappropriate/non-agitated       Comprehension  Auditory Comprehension Overall Auditory Comprehension: Impaired Commands: Impaired One Step Basic Commands: 75-100% accurate Conversation: Simple    Expression Expression Primary Mode of Expression: Verbal Verbal Expression Overall Verbal Expression: Appears within functional limits for tasks assessed   Oral / Motor  Oral Motor/Sensory Function Overall Oral Motor/Sensory Function: Generalized oral weakness Motor Speech Overall Motor Speech: Impaired Respiration: Within functional limits Phonation: Low vocal intensity Resonance: Within functional limits Articulation: Impaired Level of Impairment: Word Intelligibility: Intelligibility reduced Word: 50-74% accurate Phrase: 50-74% accurate Effective Techniques: Increased vocal intensity   GO                    Osie Bond., M.A. [place] [facility] Pager [phone] Office [phone]  11/07/2019, 1:06 PM

## 2019-11-07 NOTE — Progress Notes (Signed)
Physical Therapy Treatment Patient Details Name: William Gibson MRN: [id number] DOB: November 22, 1945 Today's Date: 11/07/2019    History of Present Illness Patient is a 74 year old white male with a history of alcoholism, rheumatoid arthritis, alcoholic liver disease, tobacco use, previous CVA.  Patient was at home doing some work outside when he fell and hit the back of his head on the concrete driveway with positive LOC.  Head CT positive for subarachnoid hemorrhage and basilar skull fracture.    PT Comments    Patient progressing some this session with short distance in room ambulation with less assist and able to demonstrate sitting balance improved during toileting activity.  Still needing significant help with ambulation in hallway to prevent anterior LOB and running into obstacles in his path in hallway.  He did well with consistent cueing, though likely was too much for gait corrections, but visual target consistently works to improve upright posture.  Feel he remains appropriate for CIR prior to d/c home with family support.    Follow Up Recommendations  CIR     Equipment Recommendations  None recommended by PT    Recommendations for Other Services       Precautions / Restrictions Precautions Precautions: Fall    Mobility  Bed Mobility Overal bed mobility: Needs Assistance Bed Mobility: Supine to Sit     Supine to sit: Min guard;HOB elevated     General bed mobility comments: cues to slow down and assist for safety, lines  Transfers Overall transfer level: Needs assistance Equipment used: Rolling walker (2 wheeled) Transfers: Sit to/from Stand Sit to Stand: Min assist         General transfer comment: up wtih assist for safety and lines  Ambulation/Gait Ambulation/Gait assistance: Mod assist;+2 safety/equipment;Min assist Gait Distance (Feet): 150 Feet (&20') Assistive device: Rolling walker (2 wheeled) Gait Pattern/deviations: Step-through pattern;Decreased  stride length;Shuffle;Trunk flexed     General Gait Details: to bathroom with min A for balance/walker management, then in the hallway needing mod facilitation for hip/trunk extension and cues throughout for forward gaze and assist for negotiating obstacles in hallway.   Stairs             Wheelchair Mobility    Modified Rankin (Stroke Patients Only)       Balance Overall balance assessment: Needs assistance   Sitting balance-Leahy Scale: Fair Sitting balance - Comments: more upright sitting on toilet with S for balance, assist to manage clothing for toilet hygiene   Standing balance support: Bilateral upper extremity supported Standing balance-Leahy Scale: Poor Standing balance comment: A for balance with UE support                            Cognition Arousal/Alertness: Awake/alert Behavior During Therapy: Flat affect Overall Cognitive Status: Impaired/Different from baseline Area of Impairment: Orientation;Attention;Memory;Following commands;Safety/judgement;Awareness;Problem solving;Rancho level               Rancho Levels of Cognitive Functioning Rancho Los Amigos Scales of Cognitive Functioning: Confused/inappropriate/non-agitated   Current Attention Level: Sustained   Following Commands: Follows one step commands consistently;Follows one step commands with increased time Safety/Judgement: Decreased awareness of safety;Decreased awareness of deficits   Problem Solving: Slow processing;Requires verbal cues        Exercises      General Comments General comments (skin integrity, edema, etc.): daughter and wife in the room, RN reports back on Precedex      Pertinent Vitals/Pain Pain Assessment: Faces Faces  Pain Scale: No hurt    Home Living                      Prior Function            PT Goals (current goals can now be found in the care plan section) Progress towards PT goals: Progressing toward goals     Frequency    Min 4X/week      PT Plan Current plan remains appropriate    Co-evaluation              AM-PAC PT "6 Clicks" Mobility   Outcome Measure  Help needed turning from your back to your side while in a flat bed without using bedrails?: None Help needed moving from lying on your back to sitting on the side of a flat bed without using bedrails?: A Little Help needed moving to and from a bed to a chair (including a wheelchair)?: A Little Help needed standing up from a chair using your arms (e.g., wheelchair or bedside chair)?: A Little Help needed to walk in hospital room?: A Lot Help needed climbing 3-5 steps with a railing? : A Lot 6 Click Score: 17    End of Session Equipment Utilized During Treatment: Gait belt Activity Tolerance: Patient limited by fatigue Patient left: in chair;with call bell/phone within reach;with restraints reapplied;with family/visitor present   PT Visit Diagnosis: Other abnormalities of gait and mobility (R26.89);History of falling (Z91.81);Other symptoms and signs involving the nervous system (R29.898)     Time: 6503-5465 PT Time Calculation (min) (ACUTE ONLY): 30 min  Charges:  $Gait Training: 8-22 mins $Therapeutic Activity: 8-22 mins                     Magda Kiel, PT Acute Rehabilitation Services [phone] Office:[phone] 11/07/2019    [name] 11/07/2019, 7:03 PM

## 2019-11-07 NOTE — Progress Notes (Signed)
Pt with increased RR and increased sputum production with thick consistency. Notified MD and orders received- pcxr and procalcitonin

## 2019-11-07 NOTE — Evaluation (Signed)
Clinical/Bedside Swallow Evaluation Patient Details  Name: William Gibson MRN: [id number] Date of Birth: 1945-12-02  Today's Date: 11/07/2019 Time: SLP Start Time (ACUTE ONLY): 4008 SLP Stop Time (ACUTE ONLY): 1215 SLP Time Calculation (min) (ACUTE ONLY): 17 min  Past Medical History:  Past Medical History:  Diagnosis Date  . Abnormal liver function tests 12/15/2018  . Alcoholic liver disease ([place]) 12/15/2018  . Alcoholism ([place])   . Elevated LDL cholesterol level 05/22/2013  . Epistaxis   . Erectile dysfunction 05/22/2013  . Frequent falls 11/09/2018  . GERD (gastroesophageal reflux disease)   . Hyperammonemia ([place]) 11/15/2018  . Memory change 08/17/2017  . RA (rheumatoid arthritis) ([place]) 08/17/2017  . RBBB 12/15/2018  . Rheumatoid arthritis ([place]) 2010  . Shuffling gait 12/19/2018  . Tobacco abuse 04/26/2012  . Weight loss, unintentional 11/09/2018  . 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 12/15/2018   Past Surgical History:  Past Surgical History:  Procedure Laterality Date  . NASAL ENDOSCOPY WITH EPISTAXIS CONTROL N/A 04/12/2018   Procedure: NASAL ENDOSCOPY WITH EPISTAXIS CONTROL;  Surgeon: Melissa Montane, MD;  Location: [facility];  Service: ENT;  Laterality: N/A;  . VASECTOMY    . VASECTOMY REVERSAL     HPI:  Patient is a 74 year old white male with a history of alcoholism, rheumatoid arthritis, alcoholic liver disease, tobacco use, previous CVA, GERD.  Patient was at home doing some work outside when he fell and hit the back of his head on the concrete driveway with positive LOC.  Head CT positive for subarachnoid hemorrhage and basilar skull fracture.   Assessment / Plan / Recommendation Clinical Impression  Pt has intermittent coughing and throat clearing with thin liquids, worsened in the presence of oral residue from solid boluses. He has reduced awareness of the above, as well as with small amounts of anterior spillage, and needs cues for safety not to put more food in his  mouth before clearing what is already there. When allowed to drink nectar thick liquids in sequential boluses there are no overt s/s of aspiration. Recommend adjusting diet to Dys 2 solids and nectar thick liquids with full supervision. Current recommendations were reviewed in detail with the family as well, including s/s of aspiration for which to monitor. SLP will f/u as alertness and mentation improve to determine if upgrades can be made with or without MBS.   SLP Visit Diagnosis: Dysphagia, oropharyngeal phase (R13.12)    Aspiration Risk  Mild aspiration risk;Moderate aspiration risk    Diet Recommendation Dysphagia 2 (Fine chop);Nectar-thick liquid   Liquid Administration via: Cup;Straw Medication Administration: Whole meds with puree Supervision: Patient able to self feed;Full supervision/cueing for compensatory strategies Compensations: Slow rate;Small sips/bites;Minimize environmental distractions Postural Changes: Seated upright at 90 degrees;Remain upright for at least 30 minutes after po intake    Other  Recommendations Oral Care Recommendations: Oral care BID Other Recommendations: Order thickener from pharmacy;Prohibited food (jello, ice cream, thin soups);Remove water pitcher   Follow up Recommendations Inpatient Rehab      Frequency and Duration min 2x/week  2 weeks       Prognosis Prognosis for Safe Diet Advancement: Good Barriers to Reach Goals: Cognitive deficits      Swallow Study   General HPI: Patient is a 74 year old white male with a history of alcoholism, rheumatoid arthritis, alcoholic liver disease, tobacco use, previous CVA, GERD.  Patient was at home doing some work outside when he fell and hit the back of his head on the concrete driveway  with positive LOC.  Head CT positive for subarachnoid hemorrhage and basilar skull fracture. Type of Study: Bedside Swallow Evaluation Previous Swallow Assessment: none in chart Diet Prior to this Study: Regular;Thin  liquids Temperature Spikes Noted: No Respiratory Status: Room air History of Recent Intubation: No Behavior/Cognition: Lethargic/Drowsy;Cooperative;Requires cueing;Impulsive Oral Cavity Assessment: Dry Oral Care Completed by SLP: No Oral Cavity - Dentition: Adequate natural dentition Vision: Functional for self-feeding Self-Feeding Abilities: Able to feed self Patient Positioning: Upright in chair Baseline Vocal Quality: Low vocal intensity Volitional Swallow: Able to elicit    Oral/Motor/Sensory Function Overall Oral Motor/Sensory Function: Generalized oral weakness   Ice Chips Ice chips: Not tested   Thin Liquid Thin Liquid: Impaired Presentation: Cup;Self Fed;Straw Pharyngeal  Phase Impairments: Cough - Immediate;Cough - Delayed;Throat Clearing - Immediate;Throat Clearing - Delayed    Nectar Thick Nectar Thick Liquid: Within functional limits Presentation: Cup;Self Fed;Straw   Honey Thick Honey Thick Liquid: Not tested   Puree Puree: Impaired Presentation: Self Fed;Spoon Oral Phase Impairments: Reduced labial seal Oral Phase Functional Implications: Left anterior spillage   Solid     Solid: Impaired Presentation: Self Fed Oral Phase Impairments: Impaired mastication;Poor awareness of bolus Oral Phase Functional Implications: Oral residue      Osie Bond., M.A. [place] Pager [phone] Office [phone]  11/07/2019,12:57 PM

## 2019-11-07 NOTE — TOC CAGE-AID Note (Signed)
Transition of Care [facility]) - CAGE-AID Screening   Patient Details  Name: William Gibson MRN: [id number] Date of Birth: 1946/01/06  Transition of Care [facility]) CM/SW Contact:    Emeterio Reeve, [place] Phone Number: 11/07/2019, 4:23 PM   Clinical Narrative:  Pt states he drinks alcohol daily, pt was unable to give CSW amount. Pt stated he and his wife drinks and he doesn't see either one of them stopping. Pt stated he knows he should stop but doesn't know if he will. Pt was receptive to counseling and educational resources. Pt declined resources.   CAGE-AID Screening:    Have You Ever Felt You Ought to Cut Down on Your Drinking or Drug Use?: Yes Have People Annoyed You By Critizing Your Drinking Or Drug Use?: Yes Have You Felt Bad Or Guilty About Your Drinking Or Drug Use?: No Have You Ever Had a Drink or Used Drugs First Thing In The Morning to Steady Your Nerves or to Get Rid of a Hangover?: No CAGE-AID Score: 2  Substance Abuse Education Offered: Yes  Substance abuse interventions: Patient Counseling, Educational Materials  Emeterio Reeve, Latanya Presser, [place] Social Worker [phone]

## 2019-11-08 DIAGNOSIS — E878 Other disorders of electrolyte and fluid balance, not elsewhere classified: Secondary | ICD-10-CM

## 2019-11-08 DIAGNOSIS — F10231 Alcohol dependence with withdrawal delirium: Principal | ICD-10-CM

## 2019-11-08 DIAGNOSIS — I609 Nontraumatic subarachnoid hemorrhage, unspecified: Secondary | ICD-10-CM

## 2019-11-08 LAB — COMPREHENSIVE METABOLIC PANEL
ALT: 37 U/L (ref 0–44)
AST: 34 U/L (ref 15–41)
Albumin: 2.9 g/dL — ABNORMAL LOW (ref 3.5–5.0)
Alkaline Phosphatase: 66 U/L (ref 38–126)
Anion gap: 13 (ref 5–15)
BUN: 14 mg/dL (ref 8–23)
CO2: 21 mmol/L — ABNORMAL LOW (ref 22–32)
Calcium: 8.7 mg/dL — ABNORMAL LOW (ref 8.9–10.3)
Chloride: 100 mmol/L (ref 98–111)
Creatinine, Ser: 0.76 mg/dL (ref 0.61–1.24)
GFR calc Af Amer: >60 mL/min (ref >60)
GFR calc non Af Amer: >60 mL/min (ref >60)
Glucose, Bld: 123 mg/dL — ABNORMAL HIGH (ref 70–99)
Potassium: 3.5 mmol/L (ref 3.5–5.1)
Sodium: 134 mmol/L — ABNORMAL LOW (ref 135–145)
Total Bilirubin: 1 mg/dL (ref 0.3–1.2)
Total Protein: 7.2 g/dL (ref 6.5–8.1)

## 2019-11-08 LAB — MAGNESIUM: Magnesium: 1.8 mg/dL (ref 1.7–2.4)

## 2019-11-08 LAB — PHOSPHORUS: Phosphorus: 3 mg/dL (ref 2.5–4.6)

## 2019-11-08 MED ORDER — NAPROXEN 250 MG PO TABS
500.0000 mg | ORAL_TABLET | Freq: Two times a day (BID) | ORAL | Status: DC | PRN
  Administered 2019-11-08 – 2019-11-09 (×2): 500 mg via ORAL
  Filled 2019-11-08 (×3): qty 2

## 2019-11-08 MED ORDER — CLONIDINE HCL 0.1 MG PO TABS
0.1000 mg | ORAL_TABLET | ORAL | Status: DC
  Administered 2019-11-10: 0.1 mg via ORAL
  Filled 2019-11-08: qty 1

## 2019-11-08 MED ORDER — METHOCARBAMOL 500 MG PO TABS: 500.0000 mg | ORAL_TABLET | Freq: Three times a day (TID) | ORAL | Status: DC | PRN

## 2019-11-08 MED ORDER — POTASSIUM CHLORIDE CRYS ER 20 MEQ PO TBCR
40.0000 meq | EXTENDED_RELEASE_TABLET | Freq: Once | ORAL | Status: AC
  Administered 2019-11-08: 40 meq via ORAL
  Filled 2019-11-08: qty 2

## 2019-11-08 MED ORDER — DICYCLOMINE HCL 20 MG PO TABS
20.0000 mg | ORAL_TABLET | Freq: Four times a day (QID) | ORAL | Status: DC | PRN
  Filled 2019-11-08: qty 1

## 2019-11-08 MED ORDER — CLONIDINE HCL 0.1 MG PO TABS
0.1000 mg | ORAL_TABLET | Freq: Four times a day (QID) | ORAL | Status: AC
  Administered 2019-11-08 – 2019-11-09 (×8): 0.1 mg via ORAL
  Filled 2019-11-08 (×8): qty 1

## 2019-11-08 MED ORDER — LOPERAMIDE HCL 2 MG PO CAPS
2.0000 mg | ORAL_CAPSULE | ORAL | Status: DC | PRN
  Filled 2019-11-08: qty 2

## 2019-11-08 MED ORDER — HYDROXYZINE HCL 25 MG PO TABS
25.0000 mg | ORAL_TABLET | Freq: Four times a day (QID) | ORAL | Status: DC | PRN
  Filled 2019-11-08: qty 1

## 2019-11-08 MED ORDER — CLONIDINE HCL 0.1 MG PO TABS: 0.1000 mg | ORAL_TABLET | Freq: Every day | ORAL | Status: DC

## 2019-11-08 MED ORDER — MAGNESIUM SULFATE 4 GM/100ML IV SOLN
4.0000 g | Freq: Once | INTRAVENOUS | Status: AC
  Administered 2019-11-08: 4 g via INTRAVENOUS
  Filled 2019-11-08: qty 100

## 2019-11-08 MED ORDER — ONDANSETRON 4 MG PO TBDP: 4.0000 mg | ORAL_TABLET | Freq: Four times a day (QID) | ORAL | Status: DC | PRN

## 2019-11-08 NOTE — Consult Note (Signed)
Physical Medicine and Rehabilitation Consult   Reason for Consult: TBI Referring Physician: Dr. Chase Caller   HPI: William Gibson is a 74 y.o. male with history of HTN, RA, fatty liver, ETOH abuse, shuffling gait flexed gait as well as increase in falls for about a week with last fall resulting with LOC ~ 10 minutes on 07/09 and reports of headache.  He was intoxicated at admission with ETOH level 190 and CT head done revealing bilateral mild to moderate SAH along bilateral tentorium and along right temporal and left frontal lobe as well as cute non-displaced fracture occipital region extending inferiorly to level of foramen magnum. Dr. Vertell Limber recommended conservative management and follow up CT head stable. He was admitted for observation and developed ETOH withdrawal with DT's requiring CIWA protocol as well as Precedex. PCCM following for support and patient's respiratory status has been stable and was  weaned off Precedex by 07/13. Therapy ongoing and patient continues to be limited by balance deficits with poor safety awareness, impulsivity and lethargy. CIR recommended due to functional decline.    Discussed with daughter, patient very active doing yard work but had multiple falls mainly when outside prior to admission.  He was found on the driveway prior to going to the hospital. The patient indicates that he has had some shoulder and finger pain and limitations related to his rheumatoid arthritis. He denies numbness or tingling in the hands or the feet.  Review of Systems  Musculoskeletal: Positive for falls (depends--worse in the past few weeks.  ).  Neurological: Positive for weakness.      Past Medical History:  Diagnosis Date  . Abnormal liver function tests 12/15/2018  . Alcoholic liver disease ([place]) 12/15/2018  . Alcoholism ([place])   . Elevated LDL cholesterol level 05/22/2013  . Epistaxis   . Erectile dysfunction 05/22/2013  . Frequent falls 11/09/2018  . GERD  (gastroesophageal reflux disease)   . Hyperammonemia ([place]) 11/15/2018  . Memory change 08/17/2017  . RA (rheumatoid arthritis) ([place]) 08/17/2017  . RBBB 12/15/2018  . Rheumatoid arthritis ([place]) 2010  . Shuffling gait 12/19/2018  . Tobacco abuse 04/26/2012  . Weight loss, unintentional 11/09/2018  . 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 12/15/2018    Past Surgical History:  Procedure Laterality Date  . NASAL ENDOSCOPY WITH EPISTAXIS CONTROL N/A 04/12/2018   Procedure: NASAL ENDOSCOPY WITH EPISTAXIS CONTROL;  Surgeon: Melissa Montane, MD;  Location: [facility]S;  Service: ENT;  Laterality: N/A;  . VASECTOMY    . VASECTOMY REVERSAL      Family History  Problem Relation Age of Onset  . Lung cancer Father   . Heart disease Mother   . Colon cancer Neg Hx   . Esophageal cancer Neg Hx   . Stomach cancer Neg Hx     Social History:  Married.  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 Wife has been working from home since [employer]. Per reports that he has been smoking cigarettes. He has a 87.00 pack-year smoking history. He has never used smokeless tobacco. He reports current alcohol use. He reports that he does not use drugs.    Allergies: No Known Allergies    Medications Prior to Admission  Medication Sig Dispense Refill  . Cyanocobalamin (VITAMIN B-12 PO) Take 1 tablet by mouth daily.    . meloxicam (MOBIC) 15 MG tablet Take 15 mg by mouth daily.  2  . omeprazole (PRILOSEC OTC) 20 MG  tablet Take 20 mg by mouth daily.    Marland Kitchen thiamine (VITAMIN B-1) 100 MG tablet Take 1 tablet (100 mg total) by mouth daily. 90 tablet 3  . chlordiazePOXIDE (LIBRIUM) 25 MG capsule 50mg  PO TID x 1D, then 25-50mg  PO BID X 1D, then 25-50mg  PO QD X 1D (Patient not taking: Reported on 11/03/2019) 15 capsule 0  . Cobalamin Combinations (VITAMIN B12-FOLIC ACID) 956-213 MCG TABS 1 tab by mouth daily. (Patient not taking: Reported on 11/03/2019) 90 tablet 3    Home: [place] expects to be discharged to:: Private residence Living Arrangements: Spouse/significant other Available Help at Discharge: Family Type of Home: House Home Access: Stairs to enter Technical brewer of Steps: 3 Entrance Stairs-Rails: Right Home Layout: One level Bathroom Shower/Tub: [place]: Environmental consultant - 2 wheels, Other (comment), Shower seat Additional Comments: walking stick; states has a chair he can put into the shower  Functional History: Prior Function Level of Independence: Independent with assistive device(s) Comments: reports multiple falls at home, uses walking stick outside, enjoys yardwork, Daughter present today reports her mom can't get him up when he falls, pt reports he gets up on his own Functional Status:  Mobility: Bed Mobility Overal bed mobility: Needs Assistance Bed Mobility: Supine to Sit Supine to sit: Min guard, HOB elevated General bed mobility comments: cues to slow down and assist for safety, lines Transfers Overall transfer level: Needs assistance Equipment used: Rolling walker (2 wheeled) Transfers: Sit to/from Stand Sit to Stand: Min assist Stand pivot transfers: Mod assist General transfer comment: up wtih assist for safety and lines Ambulation/Gait Ambulation/Gait assistance: Mod assist, +2 safety/equipment, Min assist Gait Distance (Feet): 150 Feet (&20') Assistive device: Rolling walker (2 wheeled) Gait Pattern/deviations: Step-through pattern, Decreased stride length, Shuffle, Trunk flexed General Gait Details: to bathroom with min A for balance/walker management, then in the hallway needing mod facilitation for hip/trunk extension and cues throughout for forward gaze and assist for negotiating obstacles in hallway.    ADL: ADL Overall ADL's : Needs assistance/impaired Eating/Feeding: Moderate assistance, Sitting Grooming: Wash/dry hands, Wash/dry face, Oral care, Brushing hair, Total assistance,  Standing Grooming Details (indicate cue type and reason): Pt unable to sustain attention to engage  Upper Body Bathing: Total assistance, Sitting Lower Body Bathing: Total assistance, Sit to/from stand Upper Body Dressing : Total assistance, Sitting Lower Body Dressing: Total assistance, Sit to/from stand Toilet Transfer: Ambulation, Comfort height toilet, +2 for safety/equipment, Moderate assistance, Regular Toilet, BSC, Grab bars, RW Toileting- Clothing Manipulation and Hygiene: Total assistance, Sit to/from stand Functional mobility during ADLs: Moderate assistance, +2 for safety/equipment, Cueing for safety, Cueing for sequencing, Rolling walker General ADL Comments: Pt unable to sustain attention to complete in or engage in ADL tasks   Cognition: Cognition Overall Cognitive Status: Impaired/Different from baseline Arousal/Alertness: Lethargic Orientation Level: Oriented to person, Oriented to time, Oriented to situation, Disoriented to time Attention: Sustained Sustained Attention: Impaired Sustained Attention Impairment: Verbal basic Awareness: Impaired Awareness Impairment: Intellectual impairment, Emergent impairment, Anticipatory impairment Problem Solving: Impaired Problem Solving Impairment: Functional basic Behaviors: Impulsive Safety/Judgment: Impaired [place] [employer] Scales of Cognitive Functioning: Confused/inappropriate/non-agitated Cognition Arousal/Alertness: Awake/alert Behavior During Therapy: Flat affect Overall Cognitive Status: Impaired/Different from baseline Area of Impairment: Orientation, Attention, Memory, Following commands, Safety/judgement, Awareness, Problem solving, [place] level Orientation Level: Disoriented to, Time Current Attention Level: Sustained Memory: Decreased short-term memory Following Commands: Follows one step commands consistently, Follows one step commands with increased time Safety/Judgement: Decreased awareness of safety,  Decreased  awareness of deficits Problem Solving: Slow processing, Requires verbal cues General Comments: slow processing, needed redirection getting out of bed on L when cued to get out on R and trying to enter another room in hallway thinking it was his   Blood pressure (!) 161/88, pulse 69, temperature 98.5 F (36.9 C), temperature source Axillary, resp. rate 18, height 5\' 10"  (1.778 m), weight 74.8 kg, SpO2 96 %. Physical Exam Vitals and nursing note reviewed.  Constitutional:      General: He is not in acute distress.    Comments: Frail male was up walking with PT. Flexed posture with flexed knees, tremulous and shuffling gait.  Impulsive.  HENT:     Head: Normocephalic.  Eyes:     General: No scleral icterus.       Right eye: No discharge.        Left eye: No discharge.     Conjunctiva/sclera: Conjunctivae normal.     Pupils: Pupils are equal, round, and reactive to light.     Comments: Mild ptosis right eye, no evidence of disconjugate gaze  Neck:     Comments: Reduced range of motion in cervical spine although may be secondary to lethargy and poor compliance with examination Cardiovascular:     Rate and Rhythm: Regular rhythm. Tachycardia present.     Heart sounds: Normal heart sounds. No murmur heard.   Pulmonary:     Effort: Pulmonary effort is normal. No respiratory distress.     Breath sounds: Normal breath sounds. No stridor. No wheezing or rhonchi.  Abdominal:     General: Abdomen is flat. Bowel sounds are normal. There is distension.     Palpations: Abdomen is soft.     Tenderness: There is no abdominal tenderness.  Musculoskeletal:        General: Deformity present. No tenderness.     Right lower leg: No edema.     Left lower leg: No edema.     Comments: Patient has Heberden's nodules PIP and DIP both hands, also MCP swelling there is crepitus at the right greater than left shoulder with passive range of motion.  There is mildly diminished bilateral shoulder range  of motion flexion and abduction.  Skin:    General: Skin is warm and dry.  Neurological:     Mental Status: He is lethargic and confused.     Cranial Nerves: Dysarthria present. No facial asymmetry.     Sensory: Sensation is intact.     Motor: Tremor present.     Coordination: Finger-Nose-Finger Test abnormal.     Gait: Gait abnormal.     Comments: Patient oriented to person and place but not time Motor strength is 4/5 bilateral deltoid bicep tricep grip hip flexor knee extensor ankle dorsiflexor Sensation intact to light touch bilateral fingers and toes  Psychiatric:        Attention and Perception: He is inattentive.        Mood and Affect: Affect is flat.        Speech: Speech is slurred.        Behavior: Behavior is slowed and withdrawn.        Cognition and Memory: Cognition is impaired. Memory is impaired.    Results for orders placed or performed during the hospital encounter of 11/03/19 (from the past 24 hour(s))  Procalcitonin - Baseline     Status: None   Collection Time: 11/07/19  8:30 PM  Result Value Ref Range   Procalcitonin <0.10 ng/mL  Comprehensive  metabolic panel     Status: Abnormal   Collection Time: 11/08/19  3:52 AM  Result Value Ref Range   Sodium 134 (L) 135 - 145 mmol/L   Potassium 3.5 3.5 - 5.1 mmol/L   Chloride 100 98 - 111 mmol/L   CO2 21 (L) 22 - 32 mmol/L   Glucose, Bld 123 (H) 70 - 99 mg/dL   BUN 14 8 - 23 mg/dL   Creatinine, Ser 0.76 0.61 - 1.24 mg/dL   Calcium 8.7 (L) 8.9 - 10.3 mg/dL   Total Protein 7.2 6.5 - 8.1 g/dL   Albumin 2.9 (L) 3.5 - 5.0 g/dL   AST 34 15 - 41 U/L   ALT 37 0 - 44 U/L   Alkaline Phosphatase 66 38 - 126 U/L   Total Bilirubin 1.0 0.3 - 1.2 mg/dL   GFR calc non Af Amer >60 >60 mL/min   GFR calc Af Amer >60 >60 mL/min   Anion gap 13 5 - 15  Phosphorus     Status: None   Collection Time: 11/08/19  3:52 AM  Result Value Ref Range   Phosphorus 3.0 2.5 - 4.6 mg/dL  Magnesium     Status: None   Collection Time:  11/08/19  3:52 AM  Result Value Ref Range   Magnesium 1.8 1.7 - 2.4 mg/dL   DG CHEST PORT 1 VIEW  Result Date: 11/07/2019 CLINICAL DATA:  Tachypneic EXAM: PORTABLE CHEST 1 VIEW COMPARISON:  11/03/2019 FINDINGS: Single frontal view of the chest demonstrates an unremarkable cardiac silhouette. No airspace disease, effusion, or pneumothorax. No acute bony abnormalities. IMPRESSION: 1. Stable exam, no acute process. Electronically Signed   By: Randa Ngo M.D.   On: 11/07/2019 19:54     Assessment/Plan: Diagnosis: SAH after fall with balance and cognitive deficits 1. Does the need for close, 24 hr/day medical supervision in concert with the patient's rehab needs make it unreasonable for this patient to be served in a less intensive setting? Yes 2. Co-Morbidities requiring supervision/potential complications: Alcoholism, RA 3. Due to bladder management, bowel management, safety, skin/wound care, disease management, medication administration, pain management and patient education, does the patient require 24 hr/day rehab nursing? Yes 4. Does the patient require coordinated care of a physician, rehab nurse, therapy disciplines of PT, OT< SLP to address physical and functional deficits in the context of the above medical diagnosis(es)? Yes Addressing deficits in the following areas: balance, endurance, locomotion, strength, transferring, bowel/bladder control, bathing, dressing, feeding, grooming, toileting, cognition, speech, swallowing and psychosocial support 5. Can the patient actively participate in an intensive therapy program of at least 3 hrs of therapy per day at least 5 days per week? Yes 6. The potential for patient to make measurable gains while on inpatient rehab is good 7. Anticipated functional outcomes upon discharge from inpatient rehab are supervision  with PT, supervision with OT, supervision with SLP. 8. Estimated rehab length of stay to reach the above functional goals is:  14-16d 9. Anticipated discharge destination: Home 10. Overall Rehab/Functional Prognosis: good  RECOMMENDATIONS: This patient's condition is appropriate for continued rehabilitative care in the following setting: CIR Patient has agreed to participate in recommended program. N/A Note that insurance prior authorization may be required for reimbursement for recommended care.  Comment: daughter at bedside in agreement with plan for in hospital rehab   Bary Leriche, PA-C 11/08/2019  "I have personally performed a face to face diagnostic evaluation of this patient.  Additionally, I have reviewed and concur with  the physician assistant's documentation above." Charlett Blake M.D. [facility] FAAPM&R (Neuromuscular Med) Diplomate Am Board of Electrodiagnostic Med Fellow Am Board of Interventional Pain

## 2019-11-08 NOTE — Progress Notes (Signed)
  Speech Language Pathology Treatment: Dysphagia;Cognitive-Linquistic  Patient Details Name: William Gibson MRN: [id number] DOB: 1946/03/31 Today's Date: 11/08/2019 Time: [id number] SLP Time Calculation (min) (ACUTE ONLY): 25 min  Assessment / Plan / Recommendation Clinical Impression  Pt participated in self-care tasks with Min cues for sustained attention. He followed commands consistently throughout session given Min cues. SLP also provided skilled observation during breakfast meal with Min-Mod cues needed primarily for clearance of oral residue. Pt otherwise did well with self-pacing and bolus size given that foods were pre-chopped. No overt s/s of aspiration were noted. Pt's other daughter was present today and educated on current with all questions answered as able.   HPI HPI: Patient is a 74 year old white male with a history of alcoholism, rheumatoid arthritis, alcoholic liver disease, tobacco use, previous CVA, GERD.  Patient was at home doing some work outside when he fell and hit the back of his head on the concrete driveway with positive LOC.  Head CT positive for subarachnoid hemorrhage and basilar skull fracture.      SLP Plan  Continue with current plan of care       Recommendations  Diet recommendations: Dysphagia 2 (fine chop);Nectar-thick liquid Liquids provided via: Cup;Straw Medication Administration: Whole meds with puree Supervision: Patient able to self feed;Full supervision/cueing for compensatory strategies Compensations: Slow rate;Small sips/bites;Minimize environmental distractions Postural Changes and/or Swallow Maneuvers: Seated upright 90 degrees                Oral Care Recommendations: Oral care BID Follow up Recommendations: Inpatient Rehab SLP Visit Diagnosis: Dysphagia, oropharyngeal phase (R13.12) Plan: Continue with current plan of care       GO                William Gibson., M.A. [place] [facility] Pager  [phone] Office [phone]  11/08/2019, 10:16 AM

## 2019-11-08 NOTE — Progress Notes (Signed)
Subjective: Patient reports that he is feeling better. He is no longer requiring 5 point restraints and is now only using a soft restraint belt for safety. Precedex gtt has been off since 1000 hours yesterday.  Objective: Vital signs in last 24 hours: Temp:  [98 F (36.7 C)-99 F (37.2 C)] 98.5 F (36.9 C) (07/14 0800) Pulse Rate:  [53-103] 69 (07/14 0900) Resp:  [17-29] 18 (07/14 0900) BP: (103-165)/(65-90) 161/88 (07/14 0900) SpO2:  [90 %-98 %] 96 % (07/14 0900)  Intake/Output from previous day: 07/13 0701 - 07/14 0700 In: 912.3 [P.O.:100; I.V.:749.4; IV Piggyback:62.9] Out: 800 [Urine:800] Intake/Output this shift: Total I/O In: 149.9 [I.V.:149.9] Out: 75 [Urine:75]  Physical Exam: Patient is easily redirectable but remains lethargic. He arouses to voice and follows simple commands. His is dysarthric and is oriented to X2 and disorientied to time. PERRL 55mm brisk. MAEW. Soft waist restraint in place for patient and staff safety.    Lab Results: Recent Labs    11/07/19 0337  WBC 9.7  HGB 14.6  HCT 43.5  PLT 209   BMET Recent Labs    11/07/19 0337 11/08/19 0352  NA 134* 134*  K 4.1 3.5  CL 102 100  CO2 22 21*  GLUCOSE 103* 123*  BUN 14 14  CREATININE 0.81 0.76  CALCIUM 9.3 8.7*    Studies/Results: DG CHEST PORT 1 VIEW  Result Date: 11/07/2019 CLINICAL DATA:  Tachypneic EXAM: PORTABLE CHEST 1 VIEW COMPARISON:  11/03/2019 FINDINGS: Single frontal view of the chest demonstrates an unremarkable cardiac silhouette. No airspace disease, effusion, or pneumothorax. No acute bony abnormalities. IMPRESSION: 1. Stable exam, no acute process. Electronically Signed   By: Randa Ngo M.D.   On: 11/07/2019 19:54    Assessment/Plan: Patient is beginning to improve and is requiring less Preceex and Ativan to manage is ETOH withdrawal. Continue CIWA protocol and treat patients withdrawal symptoms as appropriate. Continue supportive care. Continue working with therapies  when appropriate. There are no new neurosurgical recommendations at this time. He is ok to transfer out of the ICU from a neurosurgery perspective. Call with any questions.    LOS: 3 days    Peggyann Shoals, MD 11/08/2019, 9:17 AM

## 2019-11-08 NOTE — Progress Notes (Signed)
Rehab Admissions Coordinator Note:  Per PT, OT, and SLP recommendation, this patient was screened by Raechel Ache for appropriateness for an Inpatient Acute Rehab Consult.  At this time, we are recommending Inpatient Rehab consult. AC will contact MD to request order.   Raechel Ache 11/08/2019, 7:42 AM  I can be reached at [phone].

## 2019-11-08 NOTE — Consult Note (Addendum)
NAME:  William Gibson, MRN:  [id number], DOB:  03-02-1946, LOS: 3 ADMISSION DATE:  11/03/2019, CONSULTATION DATE:  11/08/19 REFERRING MD:  Hospitalist, CHIEF COMPLAINT:  ETOH withdrawal   Brief History    William Gibson is a 74 y.o. M with PMH of ETOH abuse (over 5th whiskey per day), alcoholic liver disease, tobacco use, RA and HL who was drinking and had a fall striking his head with resulting SAH.  He was seen by neurosurgery with no surgical intervention indicated   He was admitted for observation and subsequently developed worsening ETOH withdrawal with DT's.  He received Librium and CIWA protocol throughout the day and had just fallen asleep at the time of consult.   Approximately 16mg  Ativan given in the last 8hrs.  Family states last drink was around 6pm 7/9 and patient has never gone through withdrawals as has never stopped drinking in the past.  No history of seizures.   Past Medical History   has a past medical history of Abnormal liver function tests (1/44/3154), Alcoholic liver disease ([place]) (12/15/2018), Alcoholism ([place]), Elevated LDL cholesterol level (05/22/2013), Epistaxis, Erectile dysfunction (05/22/2013), Frequent falls (11/09/2018), GERD (gastroesophageal reflux disease), Hyperammonemia ([place]) (11/15/2018), Memory change (08/17/2017), RA (rheumatoid arthritis) ([place]) (08/17/2017), RBBB (12/15/2018), Rheumatoid arthritis ([place]) (2010), Shuffling gait (12/19/2018), Tobacco abuse (04/26/2012), Weight loss, unintentional (11/09/2018), and 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   Significant Hospital Events   7/9 Admit to Neurosrugery 7/11 Worsening DT's, PCCM consult. Pt sleeping comfortably without HTN or tachycardia 7.12  - remains on precedex gtt. On librium taper Calm. Not on vent. Not on pressors. Seen by NSGY Today.  7/13 - off librium taper x 24h . Was able to walk without precedex gtt. Off precedex -> oriented x 4 and then starts hallucinating needing precedex -> then deep  sleep per RN. Only ativan x 2 overnight in past 24h. Improved need for ativan rescue. Per RN -> family was worried that pre-admit was weak with and losing balance that resulted in fall   Consults:  PCCM  Procedures:    Significant Diagnostic Tests:  7/9 CT head>>acute nondisplaced fracture is seen involving the occipital region of the skull, Mild-to-moderate amount of subarachnoid blood along the tentorium cerebelli, bilaterally 7/9 CT C-spine>>no acute fx 7/10 CT head>>unchanged SAH and subdural  Micro Data:  7/10 MRSA screen>>negative 7/9 Sars-CoV-2>>negative  Antimicrobials:   Anti-infectives (From admission, onward)   None       Interim history/subjective:   11/08/2019 - RN concerned about possible aspiration late last night. But no fever and PCT < 0.1. Off precedex gtt x 24h. Did not get ativan last night but still confused.lesantly confused this morning (knew it was 2021 but said William Gibson was [facility] and was followed by William Gibson).    Not on vent. Not on pressors. PT recommending inpatient rehab consult . Failed sawllow yesterday with speech Rx -> Dysphagia D2 diet recommended. Swallowed pills for RN last night well  Did not get ativan last night  Poor dentition +  Objective   Blood pressure (!) 141/65, pulse (!) 103, temperature 99 F (37.2 C), temperature source Oral, resp. rate 19, height 5\' 10"  (1.778 m), weight 74.8 kg, SpO2 98 %.        Intake/Output Summary (Last 24 hours) at 11/08/2019 0814 Last data filed at 11/08/2019 0600 Gross per 24 hour  Intake 889.84 ml  Output 800 ml  Net 89.84 ml   Filed Weights   11/03/19 2035  Weight: 74.8 kg    General Appearance:  Looks criticall ill OBESE - no Head:  Normocephalic, without obvious abnormality, atraumatic Eyes:  PERRL - yes, conjunctiva/corneas - muddy     Ears:  Normal external ear canals, both ears Nose:  G tube - no Throat:  ETT TUBE - no , OG tube - no Neck:  Supple,  No  enlargement/tenderness/nodules Lungs: Clear to auscultation bilaterally, Heart:  S1 and S2 normal, no murmur, CVP - no.  Pressors - no Abdomen:  Soft, no masses, no organomegaly. POSEY + Genitalia / Rectal:  Not done Extremities:  Extremities- intact. HAS OA [place] nodes Skin:  ntact in exposed areas . Sacral area - not examined Neurologic:  Sedation - none -> RASS - +1 . Moves all 4s - yes. CAM-ICU - POSITIVE . Orientation - not fully but partial and follows comands   cxr - clear   Resolved Hospital Problem list     Assessment & Plan:   Alcohol abuse with acute withdrawal and DT"s and acute encephalopathy  11/08/2019 - Improved but still confused. Off precedex gtt  Plan  - dc precedex gtt from [facility]  - start clonidine taper protocol for few days to complete detox Rx - ativan prn -Continue CIWA, , folic acid, MV and thiamine with close monitoring  Fall with Subarachnoid hemorrhage and basilar skull fracture - Did not require surgical intervention and repeat head CT without worsening bleed, neurosurgery following  11/08/2019 - neuro intact but having some hallucination/confusion  Plan  - per NSGY  Electrolyte imbalance  11/08/2019 - Mag < 2gm% and K < 4  Plan  - replete mag - goal > 2g%  - replete K - goal 4-.4.8  Hx of RA (Followed by DR William Gibson) - RF stronly positive 2016 and Jan 2020 . On Meloxicam. Not on DMARD. Wife says RA causes lot of pain for him  7/14 - > no pain.   Plan  - continue to hold home meloxicam   - can do plaquenil when needed for pain   Frequent Falls prior to admit x few weeks with shuffling feet  11/07/2019 - no arrhthymias per RN. Might not be related to RA pain. Cause not clear but could be etoh, deconditoning  Plan  - needs eval opd - needs PT/OT  - inpatient rehab consult placed - might need opd neuro eval   Poor dentition  Plan - brush teeth daily - needs opd dentist  Best practice:  Diet: D2 diet.  D5LR  Pain/Anxiety/Delirium protocol (if indicated): off precedex gtt. Start clonidine taper  VAP protocol (if indicated): n/a  DVT prophylaxis: SCD's -> needs NSGY input when to start heparin SQ  GI prophylaxis: protonix  Glucose control: n/a  Mobility: bed rest  Code Status: full code  Family Communication:  -  wife updated  7/12. On  7/13 - says she is in emotional distress due to husband's situation and is reminiscent of what happened to her dad. Explained overall course and current situation. Various topics discussed -> she had concerns over precedex safety > 48h. Explained [place] study showing safety over 28 days and also the off label use in etoh wd.   -> on 7/14 : Wife Riyaan Heroux updated. She was in tears when I called. Saying she did not sleep all night because husband in hospital. Informed her he is better at this point.   Disposition: ICU -> can go to floor. TRH MD to assume primary from 11/09/19  and and CCM off - d/w Dr Jeannette Corpus    Dr. Brand Males, M.D., F.C.C.P,  Pulmonary and Critical Care Medicine Staff Physician, [place] Director - Interstitial Lung Disease  Program  Pulmonary [place] [facility] [place], [place], [address]  Pager: [phone], If no answer or between  15:00h - 7:00h: call [phone] Telephone: [phone]  8:40 AM 11/08/2019    LABS    PULMONARY No results for input(s): PHART, PCO2ART, PO2ART, HCO3, TCO2, O2SAT in the last 168 hours.  Invalid input(s): PCO2, PO2  CBC Recent Labs  Lab 11/04/19 0225 11/05/19 0313 11/07/19 0337  HGB 14.3 14.0 14.6  HCT 42.3 41.6 43.5  WBC 9.2 9.9 9.7  PLT 229 223 209    COAGULATION No results for input(s): INR in the last 168 hours.  CARDIAC  No results for input(s): TROPONINI in the last 168 hours. No results for input(s): PROBNP in the last 168 hours.   CHEMISTRY Recent Labs  Lab 11/04/19 0225  11/04/19 0225 11/05/19 0313 11/05/19 0313 11/06/19 0406 11/06/19 0406 11/07/19 0337 11/08/19 0352  NA 135  --  132*  --  133*  --  134* 134*  K 4.4   < > 4.5   < > 4.1   < > 4.1 3.5  CL 103  --  100  --  101  --  102 100  CO2 20*  --  24  --  24  --  22 21*  GLUCOSE 129*  --  141*  --  117*  --  103* 123*  BUN 6*  --  6*  --  9  --  14 14  CREATININE 0.83  --  0.73  --  0.85  --  0.81 0.76  CALCIUM 8.8*  --  8.9  --  9.0  --  9.3 8.7*  MG 1.6*  --  2.0  --   --   --  1.7 1.8  PHOS 3.4  --   --   --   --   --  3.1 3.0   < > = values in this interval not displayed.   Estimated Creatinine Clearance: 83.6 mL/min (by C-G formula based on SCr of 0.76 mg/dL).   LIVER Recent Labs  Lab 11/03/19 2059 11/04/19 0225 11/06/19 0406 11/07/19 0337 11/08/19 0352  AST 80* 69* 31 42* 34  ALT 70* 66* 36 41 37  ALKPHOS 67 66 69 67 66  BILITOT 0.4 0.8 1.3* 1.2 1.0  PROT 7.1 7.2 7.0 7.3 7.2  ALBUMIN 3.2* 3.3* 3.0* 3.0* 2.9*     INFECTIOUS Recent Labs  Lab 11/07/19 2030  PROCALCITON <0.10     ENDOCRINE CBG (last 3)  No results for input(s): GLUCAP in the last 72 hours.       IMAGING x48h  - image(s) personally visualized  -   highlighted in bold DG CHEST PORT 1 VIEW  Result Date: 11/07/2019 CLINICAL DATA:  Tachypneic EXAM: PORTABLE CHEST 1 VIEW COMPARISON:  11/03/2019 FINDINGS: Single frontal view of the chest demonstrates an unremarkable cardiac silhouette. No airspace disease, effusion, or pneumothorax. No acute bony abnormalities. IMPRESSION: 1. Stable exam, no acute process. Electronically Signed   By: Randa Ngo M.D.   On: 11/07/2019 19:54

## 2019-11-08 NOTE — Progress Notes (Signed)
Physical Therapy Treatment Patient Details Name: William Gibson MRN: [id number] DOB: Sep 21, 1945 Today's Date: 11/08/2019    History of Present Illness Patient is a 74 year old white male with a history of alcoholism, rheumatoid arthritis, alcoholic liver disease, tobacco use, previous CVA.  Patient was at home doing some work outside when he fell and hit the back of his head on the concrete driveway with positive LOC.  Head CT positive for subarachnoid hemorrhage and basilar skull fracture.    PT Comments    Patient progressing slowly, but today able to stay calm off sedatives and following one step commands with a slight delay.  He remembered event that brought him to hospital, but could not remember his room number after being told twice.  He continues with gait deficits placing him at high fall risk needing mod A and up to +2 for walker management due to not picking up R foot at times on turns and with LOB.  Remains appropriate for CIR level rehab at d/c.  PT to continue to follow.    Follow Up Recommendations  CIR     Equipment Recommendations  None recommended by PT    Recommendations for Other Services       Precautions / Restrictions Precautions Precautions: Fall    Mobility  Bed Mobility Overal bed mobility: Needs Assistance Bed Mobility: Supine to Sit     Supine to sit: Min guard;HOB elevated     General bed mobility comments: needed redirection to come out on R side of bed  Transfers Overall transfer level: Needs assistance Equipment used: Rolling walker (2 wheeled) Transfers: Sit to/from Stand Sit to Stand: Min assist         General transfer comment: up wtih assist for safety and lines  Ambulation/Gait Ambulation/Gait assistance: Mod assist;+2 safety/equipment Gait Distance (Feet): 130 Feet (& 20') Assistive device: Rolling walker (2 wheeled) Gait Pattern/deviations: Step-through pattern;Decreased stride length;Shuffle;Trunk flexed;Decreased step length  - right     General Gait Details: decreased foot clearance bilaterally, but R worse than L, LOB to R when turning due to shuffling R foot more, assist for walker management and to maneuver walker around obstacles in the room; cues to notice when he returned to his room looking at room numbers on L side   Stairs             Wheelchair Mobility    Modified Rankin (Stroke Patients Only)       Balance Overall balance assessment: Needs assistance Sitting-balance support: Feet supported Sitting balance-Leahy Scale: Fair Sitting balance - Comments: reaching for toilet hygiene without LOB   Standing balance support: Bilateral upper extremity supported Standing balance-Leahy Scale: Poor Standing balance comment: A for balance with UE support                            Cognition Arousal/Alertness: Awake/alert Behavior During Therapy: Flat affect Overall Cognitive Status: Impaired/Different from baseline Area of Impairment: Following commands;Safety/judgement;Problem solving;Memory               [place] Levels of Cognitive Functioning [place] [employer] Scales of Cognitive Functioning: Confused/appropriate     Memory: Decreased short-term memory Following Commands: Follows one step commands consistently;Follows one step commands with increased time Safety/Judgement: Decreased awareness of safety;Decreased awareness of deficits   Problem Solving: Slow processing;Requires verbal cues General Comments: kept thinking his room number was the first one he paid attention to (28) even after reminders it was 31;  appropriate recall of incident leading to his admission      Exercises      General Comments General comments (skin integrity, edema, etc.): daughter in room, RN reports off sedation      Pertinent Vitals/Pain Pain Assessment: Faces Faces Pain Scale: Hurts a little bit Pain Location: headache  Pain Descriptors / Indicators: Headache Pain  Intervention(s): Monitored during session;Repositioned    Home Living                      Prior Function            PT Goals (current goals can now be found in the care plan section) Progress towards PT goals: Progressing toward goals    Frequency    Min 4X/week      PT Plan Current plan remains appropriate    Co-evaluation              AM-PAC PT "6 Clicks" Mobility   Outcome Measure  Help needed turning from your back to your side while in a flat bed without using bedrails?: None Help needed moving from lying on your back to sitting on the side of a flat bed without using bedrails?: A Little Help needed moving to and from a bed to a chair (including a wheelchair)?: A Little Help needed standing up from a chair using your arms (e.g., wheelchair or bedside chair)?: A Little Help needed to walk in hospital room?: A Lot Help needed climbing 3-5 steps with a railing? : A Lot 6 Click Score: 17    End of Session Equipment Utilized During Treatment: Gait belt Activity Tolerance: Patient tolerated treatment well Patient left: in chair;with call bell/phone within reach;with restraints reapplied   PT Visit Diagnosis: Other abnormalities of gait and mobility (R26.89);History of falling (Z91.81);Other symptoms and signs involving the nervous system (R29.898)     Time: 8921-1941 PT Time Calculation (min) (ACUTE ONLY): 28 min  Charges:  $Gait Training: 8-22 mins $Therapeutic Activity: 8-22 mins                     Magda Kiel, PT Acute Rehabilitation Services Pager:[phone] Office:[phone] 11/08/2019    [name] 11/08/2019, 4:57 PM

## 2019-11-09 ENCOUNTER — Ambulatory Visit: Payer: 59 | Admitting: Family Medicine

## 2019-11-09 LAB — COMPREHENSIVE METABOLIC PANEL
ALT: 35 U/L (ref 0–44)
AST: 38 U/L (ref 15–41)
Albumin: 2.7 g/dL — ABNORMAL LOW (ref 3.5–5.0)
Alkaline Phosphatase: 59 U/L (ref 38–126)
Anion gap: 8 (ref 5–15)
BUN: 12 mg/dL (ref 8–23)
CO2: 24 mmol/L (ref 22–32)
Calcium: 8.5 mg/dL — ABNORMAL LOW (ref 8.9–10.3)
Chloride: 102 mmol/L (ref 98–111)
Creatinine, Ser: 0.89 mg/dL (ref 0.61–1.24)
GFR calc Af Amer: >60 mL/min (ref >60)
GFR calc non Af Amer: >60 mL/min (ref >60)
Glucose, Bld: 112 mg/dL — ABNORMAL HIGH (ref 70–99)
Potassium: 4.1 mmol/L (ref 3.5–5.1)
Sodium: 134 mmol/L — ABNORMAL LOW (ref 135–145)
Total Bilirubin: 0.7 mg/dL (ref 0.3–1.2)
Total Protein: 6.5 g/dL (ref 6.5–8.1)

## 2019-11-09 MED ORDER — MENTHOL 3 MG MT LOZG
1.0000 | LOZENGE | OROMUCOSAL | Status: DC | PRN
  Administered 2019-11-09: 3 mg via ORAL
  Filled 2019-11-09: qty 9

## 2019-11-09 MED ORDER — MELOXICAM 7.5 MG PO TABS
15.0000 mg | ORAL_TABLET | Freq: Every day | ORAL | Status: DC
  Administered 2019-11-09 – 2019-11-10 (×2): 15 mg via ORAL
  Filled 2019-11-09 (×2): qty 2

## 2019-11-09 NOTE — Progress Notes (Signed)
Report called to [employer] on 3W. Patient transferred to [phone] with all belongings.  Wife Theresa/daughter [name] called to update on patient transfer.

## 2019-11-09 NOTE — Progress Notes (Signed)
Physical Therapy Treatment Patient Details Name: William Gibson MRN: [id number] DOB: 1946/04/19 Today's Date: 11/09/2019    History of Present Illness Patient is a 74 year old white male with a history of alcoholism, rheumatoid arthritis, alcoholic liver disease, tobacco use, previous CVA.  Patient was at home doing some work outside when he fell and hit the back of his head on the concrete driveway with positive LOC.  Head CT positive for subarachnoid hemorrhage and basilar skull fracture.    PT Comments    Patient progressing with ambulation able to safely walk in hallway after resting in bed with +1 A.  Spoke with OT who reports little different level of orientation and participation when fatigued earlier today.  Patient still high fall risk with R foot dragging especially with L turns and needing increased assist for balance/safety.  He continues to be a good candidate for CIR.  PT to follow.    Follow Up Recommendations  CIR     Equipment Recommendations  None recommended by PT    Recommendations for Other Services       Precautions / Restrictions Precautions Precautions: Fall    Mobility  Bed Mobility Overal bed mobility: Needs Assistance Bed Mobility: Sit to Supine;Supine to Sit     Supine to sit: Min guard;HOB elevated Sit to supine: Supervision   General bed mobility comments: min guard for safety/lines  Transfers Overall transfer level: Needs assistance Equipment used: Rolling walker (2 wheeled) Transfers: Sit to/from Stand Sit to Stand: Min guard         General transfer comment: assist for balance/lines  Ambulation/Gait Ambulation/Gait assistance: Min assist;Mod assist Gait Distance (Feet): 150 Feet Assistive device: Rolling walker (2 wheeled)       General Gait Details: cues but less facilitation for hip extension and head upright, still suffling, more with R, but only had to stop to reset balance and walker proximity when back in room   Stairs              Wheelchair Mobility    Modified Rankin (Stroke Patients Only)       Balance Overall balance assessment: Needs assistance Sitting-balance support: Feet supported Sitting balance-Leahy Scale: Good     Standing balance support: Bilateral upper extremity supported Standing balance-Leahy Scale: Poor Standing balance comment: at bedside standing with minguard A no UE support initially               High Level Balance Comments: side stepping at bedside with UE support, cues for stepping higher on R and mod A to prevent lateral LOB and cues for upright posture            Cognition Arousal/Alertness: Awake/alert Behavior During Therapy: Flat affect Overall Cognitive Status: Impaired/Different from baseline Area of Impairment: Attention;Following commands;Safety/judgement               [place]ancho Levels of Cognitive Functioning [place] Scales of Cognitive Functioning: Confused/appropriate   Current Attention Level: Selective Memory: Decreased short-term memory Following Commands: Follows one step commands consistently;Follows multi-step commands with increased time Safety/Judgement: Decreased awareness of safety;Decreased awareness of deficits     General Comments: oriented to place, situation this pm and more alert and interactive      Exercises      General Comments        Pertinent Vitals/Pain Faces Pain Scale: No hurt    Home Living  Prior Function            PT Goals (current goals can now be found in the care plan section) Progress towards PT goals: Progressing toward goals    Frequency    Min 4X/week      PT Plan      Co-evaluation              AM-PAC PT "6 Clicks" Mobility   Outcome Measure  Help needed turning from your back to your side while in a flat bed without using bedrails?: None Help needed moving from lying on your back to sitting on the side of a flat bed without  using bedrails?: None Help needed moving to and from a bed to a chair (including a wheelchair)?: A Little Help needed standing up from a chair using your arms (e.g., wheelchair or bedside chair)?: A Little Help needed to walk in hospital room?: A Lot Help needed climbing 3-5 steps with a railing? : A Lot 6 Click Score: 18    End of Session Equipment Utilized During Treatment: Gait belt Activity Tolerance: Patient tolerated treatment well Patient left: in bed;with call bell/phone within reach;with bed alarm set   PT Visit Diagnosis: Other abnormalities of gait and mobility (R26.89);History of falling (Z91.81);Other symptoms and signs involving the nervous system (R29.898)     Time: 1420-1440 PT Time Calculation (min) (ACUTE ONLY): 20 min  Charges:  $Gait Training: 8-22 mins                     Magda Kiel, PT Acute Rehabilitation Services Pager:[phone] Office:[phone] 11/09/2019    [name] 11/09/2019, 4:50 PM

## 2019-11-09 NOTE — Progress Notes (Signed)
Inpatient Rehabilitation Admissions Coordinator  I met at bedside with patient and wife. We discussed goals and expectations of a possible CIR admit. They are in agreement. I will begin insurance approval with [place] [facility] for a possible admit. I will follow up once determination made.  Danne Baxter, RN, MSN Rehab Admissions Coordinator [phone] 11/09/2019 2:25 PM

## 2019-11-09 NOTE — Progress Notes (Signed)
Patient ID: William Gibson, male   DOB: 08-12-1945, 74 y.o.   MRN: [id number]  PROGRESS NOTE    William Gibson  WJX:[id number] DOB: 11/16/1945 DOA: 11/03/2019 PCP: Patient, No Pcp Per   Brief Narrative:  74 year old male with history of alcohol abuse, alcoholic liver disease, tobacco use, rheumatoid arthritis and hearing loss presented with a fall with resulting subarachnoid hemorrhage.  He was seen by neurosurgery with no surgical intervention indicated.  Subsequently he developed worsening alcohol withdrawal with DTs.  Because of worsening DTs, PCCM was consulted and patient was started on Precedex drip.  He was transferred to ICU.  Subsequently, Precedex drip has been discontinued.  He was transferred back to [facility] service on 11/09/2019.  Assessment & Plan:   Alcohol abuse with alcohol withdrawal and DTs with acute toxic encephalopathy -Patient was started on CIWA protocol and Librium and as needed Ativan but because of worsening DTs, he was transferred to ICU and started on Precedex drip under ICU care.  -Subsequently, Precedex drip has been discontinued.  He was transferred back to [facility] service on 11/09/2019 -Mental status improving.  Currently on clonidine taper protocol.  Continue CIWA with folic acid, multivitamin and thiamine. -Fall precautions.  Monitor mental status.  Fall with subarachnoid hemorrhage and basilar skull fracture -No need for surgical intervention as per neurosurgery.  Repeat head CT without worsening bleed.  Neurosurgery following. -Fall precautions.  Mild hyponatremia -Monitor  Mildly elevated LFTs  -improving  History of RA -Followed by Dr. Gavin Pound.  On meloxicam alone as an outpatient.  Will resume meloxicam.  Outpatient follow-up with PCP/rheumatology for DMARD needs  Frequent falls  -Probably from alcohol abuse/deconditioning as well as RA.  PT/OT recommends CIR.  CIR consulted.  Patient is medically stable for discharge to CIR. -Outpatient neurology  evaluation.    DVT prophylaxis: SCDs Code Status: Full Family Communication: Wife at bedside Disposition Plan: Status is: Inpatient  Remains inpatient appropriate because:Inpatient level of care appropriate due to severity of illness   Dispo: The patient is from: Home              Anticipated d/c is to: CIR              Anticipated d/c date is: 1 day              Patient currently is medically stable to d/c.  Consultants: Neurosurgery/PCCM  Procedures:  None  Antimicrobials: None   Subjective: Patient seen and examined at bedside.  He is awake, slow to respond, poor historian.  Nursing staff reports that his mental status is improved over the last 4 hours.  No overnight fever, vomiting, seizures reported.  Objective: Vitals:   11/09/19 0800 11/09/19 0900 11/09/19 1000 11/09/19 1046  BP: 126/81 128/71 (!) 154/82 127/73  Pulse: 68 62 66 (!) 49  Resp: 19 19 (!) 24 20  Temp: 98.3 F (36.8 C)     TempSrc: Oral     SpO2: 93% 96% 100% 100%  Weight:      Height:        Intake/Output Summary (Last 24 hours) at 11/09/2019 1109 Last data filed at 11/09/2019 0800 Gross per 24 hour  Intake 1401.02 ml  Output 450 ml  Net 951.02 ml   Filed Weights   11/03/19 2035  Weight: 74.8 kg    Examination:  General exam: Appears calm and comfortable.  Looks chronically ill and deconditioned. Respiratory system: Bilateral decreased breath sounds at bases with some scattered crackles.  Intermittently tachypneic Cardiovascular system: S1 & S2 heard, intermittently bradycardic Gastrointestinal system: Abdomen is nondistended, soft and nontender. Normal bowel sounds heard. Extremities: No cyanosis, clubbing, edema  Central nervous system: Awake, slow to respond, poor historian.  No focal neurological deficits. Moving extremities Skin: No rashes, lesions or ulcers Psychiatry: Cannot be assessed because of mental status   Data Reviewed: I have personally reviewed following labs and  imaging studies  CBC: Recent Labs  Lab 11/03/19 2059 11/04/19 0225 11/05/19 0313 11/07/19 0337  WBC 8.1 9.2 9.9 9.7  NEUTROABS  --   --  7.5  --   HGB 13.7 14.3 14.0 14.6  HCT 41.2 42.3 41.6 43.5  MCV 99.8 98.6 99.5 98.9  PLT 219 229 223 099   Basic Metabolic Panel: Recent Labs  Lab 11/04/19 0225 11/04/19 0225 11/05/19 0313 11/06/19 0406 11/07/19 0337 11/08/19 0352 11/09/19 0431  NA 135   < > 132* 133* 134* 134* 134*  K 4.4   < > 4.5 4.1 4.1 3.5 4.1  CL 103   < > 100 101 102 100 102  CO2 20*   < > 24 24 22  21* 24  GLUCOSE 129*   < > 141* 117* 103* 123* 112*  BUN 6*   < > 6* 9 14 14 12   CREATININE 0.83   < > 0.73 0.85 0.81 0.76 0.89  CALCIUM 8.8*   < > 8.9 9.0 9.3 8.7* 8.5*  MG 1.6*  --  2.0  --  1.7 1.8  --   PHOS 3.4  --   --   --  3.1 3.0  --    < > = values in this interval not displayed.   GFR: Estimated Creatinine Clearance: 75.2 mL/min (by C-G formula based on SCr of 0.89 mg/dL). Liver Function Tests: Recent Labs  Lab 11/04/19 0225 11/06/19 0406 11/07/19 0337 11/08/19 0352 11/09/19 0431  AST 69* 31 42* 34 38  ALT 66* 36 41 37 35  ALKPHOS 66 69 67 66 59  BILITOT 0.8 1.3* 1.2 1.0 0.7  PROT 7.2 7.0 7.3 7.2 6.5  ALBUMIN 3.3* 3.0* 3.0* 2.9* 2.7*   Recent Labs  Lab 11/03/19 2059  LIPASE 43   No results for input(s): AMMONIA in the last 168 hours. Coagulation Profile: No results for input(s): INR, PROTIME in the last 168 hours. Cardiac Enzymes: No results for input(s): CKTOTAL, CKMB, CKMBINDEX, TROPONINI in the last 168 hours. BNP (last 3 results) No results for input(s): PROBNP in the last 8760 hours. HbA1C: No results for input(s): HGBA1C in the last 72 hours. CBG: No results for input(s): GLUCAP in the last 168 hours. Lipid Profile: No results for input(s): CHOL, HDL, LDLCALC, TRIG, CHOLHDL, LDLDIRECT in the last 72 hours. Thyroid Function Tests: No results for input(s): TSH, T4TOTAL, FREET4, T3FREE, THYROIDAB in the last 72 hours. Anemia  Panel: Recent Labs    11/07/19 0337  VITAMINB12 1,185*   Sepsis Labs: Recent Labs  Lab 11/07/19 2030  PROCALCITON <0.10    Recent Results (from the past 240 hour(s))  SARS Coronavirus 2 by RT PCR (hospital order, performed in [facility] hospital lab) Nasopharyngeal Nasopharyngeal Swab     Status: None   Collection Time: 11/03/19 10:38 PM   Specimen: Nasopharyngeal Swab  Result Value Ref Range Status   SARS Coronavirus 2 NEGATIVE NEGATIVE Final    Comment: (NOTE) SARS-CoV-2 target nucleic acids are NOT DETECTED.  The SARS-CoV-2 RNA is generally detectable in upper and lower respiratory specimens during the  acute phase of infection. The lowest concentration of SARS-CoV-2 viral copies this assay can detect is 250 copies / mL. A negative result does not preclude SARS-CoV-2 infection and should not be used as the sole basis for treatment or other patient management decisions.  A negative result may occur with improper specimen collection / handling, submission of specimen other than nasopharyngeal swab, presence of viral mutation(s) within the areas targeted by this assay, and inadequate number of viral copies (<250 copies / mL). A negative result must be combined with clinical observations, patient history, and epidemiological information.  Fact Sheet for Patients:   StrictlyIdeas.no  Fact Sheet for Healthcare Providers: BankingDealers.co.za  This test is not yet approved or  cleared by the [place] FDA and has been authorized for detection and/or diagnosis of SARS-CoV-2 by FDA under an Emergency Use Authorization (EUA).  This EUA will remain in effect (meaning this test can be used) for the duration of the COVID-19 declaration under Section 564(b)(1) of the Act, 21 U.S.C. section 360bbb-3(b)(1), unless the authorization is terminated or revoked sooner.  Performed at [facility], [place] [address], [place],  [place] [address]   MRSA PCR Screening     Status: None   Collection Time: 11/04/19  3:01 PM   Specimen: Nasopharyngeal Wash  Result Value Ref Range Status   MRSA by PCR NEGATIVE NEGATIVE Final    Comment:        The GeneXpert MRSA Assay (FDA approved for NASAL specimens only), is one component of a comprehensive MRSA colonization surveillance program. It is not intended to diagnose MRSA infection nor to guide or monitor treatment for MRSA infections. Performed at [facility], [place] [address], [place], [place] [address]          Radiology Studies: DG CHEST PORT 1 VIEW  Result Date: 11/07/2019 CLINICAL DATA:  Tachypneic EXAM: PORTABLE CHEST 1 VIEW COMPARISON:  11/03/2019 FINDINGS: Single frontal view of the chest demonstrates an unremarkable cardiac silhouette. No airspace disease, effusion, or pneumothorax. No acute bony abnormalities. IMPRESSION: 1. Stable exam, no acute process. Electronically Signed   By: Randa Ngo M.D.   On: 11/07/2019 19:54        Scheduled Meds: . Chlorhexidine Gluconate Cloth  6 each Topical Daily  . cloNIDine  0.1 mg Oral QID   Followed by  . [START ON 11/10/2019] cloNIDine  0.1 mg Oral BH-[id number]   Followed by  . [START ON 11/12/2019] cloNIDine  0.1 mg Oral QAC breakfast  . folic acid  1 mg Oral Daily   Or  . folic acid  1 mg Intravenous Daily  . mouth rinse  15 mL Mouth Rinse BID  . meloxicam  15 mg Oral Daily  . multivitamin with minerals  1 tablet Oral Daily  . nicotine  21 mg Transdermal Daily  . pantoprazole  40 mg Oral Daily  . sodium chloride flush  3 mL Intravenous Q12H  . thiamine  100 mg Oral Daily   Or  . thiamine  100 mg Intravenous Daily   Continuous Infusions:        Aline August, MD [facility] 11/09/2019, 11:09 AM

## 2019-11-09 NOTE — Progress Notes (Signed)
Occupational Therapy Treatment Patient Details Name: William Gibson MRN: [id number] DOB: 1945-08-18 Today's Date: 11/09/2019    History of present illness Patient is a 74 year old white male with a history of alcoholism, rheumatoid arthritis, alcoholic liver disease, tobacco use, previous CVA.  Patient was at home doing some work outside when he fell and hit the back of his head on the concrete driveway with positive LOC.  Head CT positive for subarachnoid hemorrhage and basilar skull fracture.   OT comments  Pt with significant improvement compared to evaluation. He is able to selectively attend to ADLs tasks, and able to perform ADLs with min A - max A.  He demonstrates significant balance deficits requiring mod A +2 for functional mobility, and demonstrates deficits with awareness, safety, problem solving, and orientation.   Continue to recommend CIR level rehab to allow him to maximize independence with ADLs, and reduce risk of falls. Will continue to follow.   Follow Up Recommendations  CIR    Equipment Recommendations  None recommended by OT    Recommendations for Other Services Rehab consult    Precautions / Restrictions Precautions Precautions: Fall       Mobility Bed Mobility Overal bed mobility: Needs Assistance Bed Mobility: Sit to Supine       Sit to supine: Min guard   General bed mobility comments: min guard for safety   Transfers Overall transfer level: Needs assistance Equipment used: 1 person hand held assist Transfers: Sit to/from [facility] Sit to Stand: Min assist Stand pivot transfers: Mod assist       General transfer comment: heavy sway and impulsive.  Requires assist for safety and balance     Balance Overall balance assessment: Needs assistance Sitting-balance support: Feet supported Sitting balance-Leahy Scale: Fair     Standing balance support: Single extremity supported;During functional activity Standing balance-Leahy  Scale: Poor Standing balance comment: pt with heavy lateral and posterior/anterior sway - requires mod A for standing balance                            ADL either performed or assessed with clinical judgement   ADL Overall ADL's : Needs assistance/impaired Eating/Feeding: Minimal assistance;Sitting Eating/Feeding Details (indicate cue type and reason): assist to control portions and for swallowing precautions  Grooming: Wash/dry hands;Wash/dry face;Oral care;Moderate assistance;Standing Grooming Details (indicate cue type and reason): requires assist for thoroughness, and for balance  Upper Body Bathing: Moderate assistance;Sitting   Lower Body Bathing: Moderate assistance;Sit to/from stand   Upper Body Dressing : Moderate assistance;Sitting   Lower Body Dressing: Maximal assistance;Sit to/from stand   Toilet Transfer: Moderate assistance;+2 for safety/equipment;Ambulation;Comfort height toilet;Grab bars   Toileting- Clothing Manipulation and Hygiene: Total assistance;Sit to/from stand       Functional mobility during ADLs: Moderate assistance;+2 for physical assistance;+2 for safety/equipment       Vision   Additional Comments: pt able to locate needed grooming items    Perception     Praxis      Cognition Arousal/Alertness: Awake/alert Behavior During Therapy: Flat affect Overall Cognitive Status: Impaired/Different from baseline Area of Impairment: Orientation;Attention;Memory;Following commands;Problem solving;Awareness;Safety/judgement;Rancho level               Rancho Levels of Cognitive Functioning [place] [employer] Scales of Cognitive Functioning: Confused/appropriate Orientation Level: Disoriented to;Time;Situation;Place Current Attention Level: Selective Memory: Decreased short-term memory Following Commands: Follows one step commands consistently;Follows one step commands inconsistently Safety/Judgement: Decreased awareness of  deficits;Decreased  awareness of safety   Problem Solving: Slow processing;Difficulty sequencing;Requires verbal cues;Requires tactile cues General Comments: Pt asks OT to "stop by [employer] on your way home and pick my tomatos for me".  As OT exited, he said "bye William Gibson, be careful, love you - thinking I was his daughter.   He is able to sequence through grooming tasks with increased time.          Exercises     Shoulder Instructions       General Comments wife present     Pertinent Vitals/ Pain       Pain Assessment: Faces Faces Pain Scale: No hurt  Home Living                                          Prior Functioning/Environment              Frequency  Min 2X/week        Progress Toward Goals  OT Goals(current goals can now be found in the care plan section)  Progress towards OT goals: Progressing toward goals     Plan Discharge plan remains appropriate    Co-evaluation                 AM-PAC OT "6 Clicks" Daily Activity     Outcome Measure   Help from another person eating meals?: A Little Help from another person taking care of personal grooming?: A Lot Help from another person toileting, which includes using toliet, bedpan, or urinal?: A Lot Help from another person bathing (including washing, rinsing, drying)?: A Lot Help from another person to put on and taking off regular upper body clothing?: A Lot Help from another person to put on and taking off regular lower body clothing?: A Lot 6 Click Score: 13    End of Session Equipment Utilized During Treatment: Gait belt  OT Visit Diagnosis: Unsteadiness on feet (R26.81);Cognitive communication deficit (R41.841)   Activity Tolerance Patient tolerated treatment well   Patient Left in bed;with bed alarm set;with call bell/phone within reach   Nurse Communication Mobility status        Time: 1137-1201 OT Time Calculation (min): 24 min  Charges: OT General Charges $OT  Visit: 1 Visit OT Treatments $Self Care/Home Management : 23-37 mins  Nilsa Nutting OTR/L Acute Rehabilitation Services Pager [phone] Office [place], [place] 11/09/2019, 12:23 PM

## 2019-11-10 ENCOUNTER — Inpatient Hospital Stay (HOSPITAL_COMMUNITY)
Admission: RE | Admit: 2019-11-10 | Discharge: 2019-11-23 | DRG: 945 | Disposition: A | Discharge status: Home / Self Care | Payer: 59 | Source: Intra-hospital | Attending: Physical Medicine & Rehabilitation | Admitting: Physical Medicine & Rehabilitation

## 2019-11-10 ENCOUNTER — Inpatient Hospital Stay (HOSPITAL_COMMUNITY): Payer: 59

## 2019-11-10 ENCOUNTER — Other Ambulatory Visit: Payer: Self-pay

## 2019-11-10 ENCOUNTER — Encounter (HOSPITAL_COMMUNITY): Payer: Self-pay | Admitting: Physical Medicine & Rehabilitation

## 2019-11-10 DIAGNOSIS — M069 Rheumatoid arthritis, unspecified: Secondary | ICD-10-CM | POA: Diagnosis present

## 2019-11-10 DIAGNOSIS — S0990XD Unspecified injury of head, subsequent encounter: Secondary | ICD-10-CM

## 2019-11-10 DIAGNOSIS — R1312 Dysphagia, oropharyngeal phase: Secondary | ICD-10-CM | POA: Diagnosis not present

## 2019-11-10 DIAGNOSIS — K860 Alcohol-induced chronic pancreatitis: Secondary | ICD-10-CM

## 2019-11-10 DIAGNOSIS — I10 Essential (primary) hypertension: Secondary | ICD-10-CM | POA: Diagnosis present

## 2019-11-10 DIAGNOSIS — R296 Repeated falls: Secondary | ICD-10-CM | POA: Diagnosis not present

## 2019-11-10 DIAGNOSIS — K5900 Constipation, unspecified: Secondary | ICD-10-CM | POA: Diagnosis present

## 2019-11-10 DIAGNOSIS — S069X0S Unspecified intracranial injury without loss of consciousness, sequela: Secondary | ICD-10-CM | POA: Diagnosis not present

## 2019-11-10 DIAGNOSIS — R1310 Dysphagia, unspecified: Secondary | ICD-10-CM | POA: Diagnosis not present

## 2019-11-10 DIAGNOSIS — Y92009 Unspecified place in unspecified non-institutional (private) residence as the place of occurrence of the external cause: Secondary | ICD-10-CM

## 2019-11-10 DIAGNOSIS — K709 Alcoholic liver disease, unspecified: Secondary | ICD-10-CM | POA: Diagnosis present

## 2019-11-10 DIAGNOSIS — K861 Other chronic pancreatitis: Secondary | ICD-10-CM | POA: Diagnosis present

## 2019-11-10 DIAGNOSIS — S0990XA Unspecified injury of head, initial encounter: Secondary | ICD-10-CM

## 2019-11-10 DIAGNOSIS — L219 Seborrheic dermatitis, unspecified: Secondary | ICD-10-CM | POA: Diagnosis present

## 2019-11-10 DIAGNOSIS — E871 Hypo-osmolality and hyponatremia: Secondary | ICD-10-CM

## 2019-11-10 DIAGNOSIS — F17210 Nicotine dependence, cigarettes, uncomplicated: Secondary | ICD-10-CM | POA: Diagnosis present

## 2019-11-10 DIAGNOSIS — S069XAA Unspecified intracranial injury with loss of consciousness status unknown, initial encounter: Secondary | ICD-10-CM

## 2019-11-10 DIAGNOSIS — F419 Anxiety disorder, unspecified: Secondary | ICD-10-CM | POA: Diagnosis present

## 2019-11-10 DIAGNOSIS — J341 Cyst and mucocele of nose and nasal sinus: Secondary | ICD-10-CM | POA: Diagnosis not present

## 2019-11-10 DIAGNOSIS — J029 Acute pharyngitis, unspecified: Secondary | ICD-10-CM | POA: Diagnosis present

## 2019-11-10 DIAGNOSIS — W010XXD Fall on same level from slipping, tripping and stumbling without subsequent striking against object, subsequent encounter: Secondary | ICD-10-CM | POA: Diagnosis present

## 2019-11-10 DIAGNOSIS — S069X0A Unspecified intracranial injury without loss of consciousness, initial encounter: Secondary | ICD-10-CM | POA: Diagnosis not present

## 2019-11-10 DIAGNOSIS — F10231 Alcohol dependence with withdrawal delirium: Secondary | ICD-10-CM | POA: Diagnosis present

## 2019-11-10 DIAGNOSIS — K219 Gastro-esophageal reflux disease without esophagitis: Secondary | ICD-10-CM | POA: Diagnosis present

## 2019-11-10 DIAGNOSIS — F1010 Alcohol abuse, uncomplicated: Secondary | ICD-10-CM | POA: Diagnosis not present

## 2019-11-10 DIAGNOSIS — I609 Nontraumatic subarachnoid hemorrhage, unspecified: Secondary | ICD-10-CM | POA: Diagnosis not present

## 2019-11-10 DIAGNOSIS — S066X1D Traumatic subarachnoid hemorrhage with loss of consciousness of 30 minutes or less, subsequent encounter: Secondary | ICD-10-CM | POA: Diagnosis present

## 2019-11-10 DIAGNOSIS — S069X0D Unspecified intracranial injury without loss of consciousness, subsequent encounter: Secondary | ICD-10-CM | POA: Diagnosis not present

## 2019-11-10 DIAGNOSIS — S069X3S Unspecified intracranial injury with loss of consciousness of 1 hour to 5 hours 59 minutes, sequela: Secondary | ICD-10-CM | POA: Diagnosis not present

## 2019-11-10 DIAGNOSIS — K760 Fatty (change of) liver, not elsewhere classified: Secondary | ICD-10-CM | POA: Diagnosis present

## 2019-11-10 DIAGNOSIS — G441 Vascular headache, not elsewhere classified: Secondary | ICD-10-CM | POA: Diagnosis not present

## 2019-11-10 DIAGNOSIS — G9389 Other specified disorders of brain: Secondary | ICD-10-CM | POA: Diagnosis not present

## 2019-11-10 DIAGNOSIS — S066X0A Traumatic subarachnoid hemorrhage without loss of consciousness, initial encounter: Secondary | ICD-10-CM | POA: Diagnosis not present

## 2019-11-10 DIAGNOSIS — W19XXXA Unspecified fall, initial encounter: Secondary | ICD-10-CM

## 2019-11-10 DIAGNOSIS — S069X1A Unspecified intracranial injury with loss of consciousness of 30 minutes or less, initial encounter: Secondary | ICD-10-CM

## 2019-11-10 DIAGNOSIS — S069X3D Unspecified intracranial injury with loss of consciousness of 1 hour to 5 hours 59 minutes, subsequent encounter: Secondary | ICD-10-CM | POA: Diagnosis not present

## 2019-11-10 DIAGNOSIS — S065X9D Traumatic subdural hemorrhage with loss of consciousness of unspecified duration, subsequent encounter: Secondary | ICD-10-CM

## 2019-11-10 LAB — CBC WITH DIFFERENTIAL/PLATELET
Abs Immature Granulocytes: 0.06 10*3/uL (ref 0.00–0.07)
Basophils Absolute: 0.1 10*3/uL (ref 0.0–0.1)
Basophils Relative: 1 %
Eosinophils Absolute: 0.3 10*3/uL (ref 0.0–0.5)
Eosinophils Relative: 4 %
HCT: 40.8 % (ref 39.0–52.0)
Hemoglobin: 13.7 g/dL (ref 13.0–17.0)
Immature Granulocytes: 1 %
Lymphocytes Relative: 16 %
Lymphs Abs: 1.5 10*3/uL (ref 0.7–4.0)
MCH: 33.4 pg (ref 26.0–34.0)
MCHC: 33.6 g/dL (ref 30.0–36.0)
MCV: 99.5 fL (ref 80.0–100.0)
Monocytes Absolute: 1 10*3/uL (ref 0.1–1.0)
Monocytes Relative: 10 %
Neutro Abs: 6.6 10*3/uL (ref 1.7–7.7)
Neutrophils Relative %: 68 %
Platelets: 169 10*3/uL (ref 150–400)
RBC: 4.1 MIL/uL — ABNORMAL LOW (ref 4.22–5.81)
RDW: 12.4 % (ref 11.5–15.5)
WBC: 9.6 10*3/uL (ref 4.0–10.5)
nRBC: 0 % (ref 0.0–0.2)

## 2019-11-10 LAB — COMPREHENSIVE METABOLIC PANEL
ALT: 39 U/L (ref 0–44)
AST: 35 U/L (ref 15–41)
Albumin: 2.7 g/dL — ABNORMAL LOW (ref 3.5–5.0)
Alkaline Phosphatase: 61 U/L (ref 38–126)
Anion gap: 8 (ref 5–15)
BUN: 12 mg/dL (ref 8–23)
CO2: 23 mmol/L (ref 22–32)
Calcium: 8.7 mg/dL — ABNORMAL LOW (ref 8.9–10.3)
Chloride: 103 mmol/L (ref 98–111)
Creatinine, Ser: 0.78 mg/dL (ref 0.61–1.24)
GFR calc Af Amer: >60 mL/min (ref >60)
GFR calc non Af Amer: >60 mL/min (ref >60)
Glucose, Bld: 102 mg/dL — ABNORMAL HIGH (ref 70–99)
Potassium: 4.3 mmol/L (ref 3.5–5.1)
Sodium: 134 mmol/L — ABNORMAL LOW (ref 135–145)
Total Bilirubin: 0.7 mg/dL (ref 0.3–1.2)
Total Protein: 6.6 g/dL (ref 6.5–8.1)

## 2019-11-10 LAB — MAGNESIUM: Magnesium: 1.8 mg/dL (ref 1.7–2.4)

## 2019-11-10 MED ORDER — PROCHLORPERAZINE MALEATE 5 MG PO TABS: 5.0000 mg | ORAL_TABLET | Freq: Four times a day (QID) | ORAL | Status: DC | PRN

## 2019-11-10 MED ORDER — TRAZODONE HCL 50 MG PO TABS
25.0000 mg | ORAL_TABLET | Freq: Every evening | ORAL | Status: DC | PRN
  Administered 2019-11-11 – 2019-11-22 (×6): 50 mg via ORAL
  Filled 2019-11-10 (×6): qty 1

## 2019-11-10 MED ORDER — MELOXICAM 7.5 MG PO TABS
15.0000 mg | ORAL_TABLET | Freq: Every day | ORAL | Status: DC
  Administered 2019-11-11 – 2019-11-12 (×2): 15 mg via ORAL
  Filled 2019-11-10 (×3): qty 2

## 2019-11-10 MED ORDER — CLONIDINE HCL 0.1 MG PO TABS
0.1000 mg | ORAL_TABLET | Freq: Every day | ORAL | Status: AC
  Administered 2019-11-12 – 2019-11-13 (×2): 0.1 mg via ORAL
  Filled 2019-11-10 (×2): qty 1

## 2019-11-10 MED ORDER — PROCHLORPERAZINE 25 MG RE SUPP: 12.5000 mg | Freq: Four times a day (QID) | RECTAL | Status: DC | PRN

## 2019-11-10 MED ORDER — POLYETHYLENE GLYCOL 3350 17 G PO PACK
17.0000 g | PACK | Freq: Every day | ORAL | Status: DC | PRN
  Administered 2019-11-12 – 2019-11-19 (×3): 17 g via ORAL
  Filled 2019-11-10 (×5): qty 1

## 2019-11-10 MED ORDER — FOLIC ACID 1 MG PO TABS: 1.0000 mg | ORAL_TABLET | Freq: Every day | ORAL | Status: DC

## 2019-11-10 MED ORDER — PANTOPRAZOLE SODIUM 40 MG PO TBEC
40.0000 mg | DELAYED_RELEASE_TABLET | Freq: Every day | ORAL | Status: DC
  Administered 2019-11-11 – 2019-11-23 (×13): 40 mg via ORAL
  Filled 2019-11-10 (×14): qty 1

## 2019-11-10 MED ORDER — BISACODYL 10 MG RE SUPP: 10.0000 mg | Freq: Every day | RECTAL | Status: DC | PRN

## 2019-11-10 MED ORDER — GUAIFENESIN-DM 100-10 MG/5ML PO SYRP: 5.0000 mL | ORAL_SOLUTION | Freq: Four times a day (QID) | ORAL | Status: DC | PRN

## 2019-11-10 MED ORDER — ADULT MULTIVITAMIN W/MINERALS CH
1.0000 | ORAL_TABLET | Freq: Every day | ORAL | Status: DC
  Administered 2019-11-11 – 2019-11-23 (×13): 1 via ORAL
  Filled 2019-11-10 (×14): qty 1

## 2019-11-10 MED ORDER — CLONIDINE HCL 0.1 MG PO TABS: ORAL_TABLET | ORAL | Status: DC

## 2019-11-10 MED ORDER — FLEET ENEMA 7-19 GM/118ML RE ENEM: 1.0000 | ENEMA | Freq: Once | RECTAL | Status: DC | PRN

## 2019-11-10 MED ORDER — DIPHENHYDRAMINE HCL 12.5 MG/5ML PO ELIX: 12.5000 mg | ORAL_SOLUTION | Freq: Four times a day (QID) | ORAL | Status: DC | PRN

## 2019-11-10 MED ORDER — ALUM & MAG HYDROXIDE-SIMETH 200-200-20 MG/5ML PO SUSP: 30.0000 mL | ORAL | Status: DC | PRN

## 2019-11-10 MED ORDER — OXYCODONE HCL 5 MG PO TABS
5.0000 mg | ORAL_TABLET | Freq: Three times a day (TID) | ORAL | Status: DC | PRN
  Administered 2019-11-11 – 2019-11-14 (×8): 5 mg via ORAL
  Filled 2019-11-10 (×8): qty 1

## 2019-11-10 MED ORDER — ACETAMINOPHEN 325 MG PO TABS
325.0000 mg | ORAL_TABLET | ORAL | Status: DC | PRN
  Administered 2019-11-10 – 2019-11-13 (×6): 650 mg via ORAL
  Administered 2019-11-14: 325 mg via ORAL
  Administered 2019-11-15 – 2019-11-17 (×2): 650 mg via ORAL
  Filled 2019-11-10 (×2): qty 2
  Filled 2019-11-10: qty 1
  Filled 2019-11-10 (×4): qty 2
  Filled 2019-11-10: qty 1
  Filled 2019-11-10 (×3): qty 2

## 2019-11-10 MED ORDER — MENTHOL 3 MG MT LOZG
1.0000 | LOZENGE | OROMUCOSAL | Status: DC | PRN
  Administered 2019-11-12 (×2): 3 mg via ORAL
  Filled 2019-11-10 (×2): qty 9

## 2019-11-10 MED ORDER — METHOCARBAMOL 500 MG PO TABS
500.0000 mg | ORAL_TABLET | Freq: Three times a day (TID) | ORAL | Status: AC | PRN
  Administered 2019-11-11: 500 mg via ORAL
  Filled 2019-11-10: qty 1

## 2019-11-10 MED ORDER — DICYCLOMINE HCL 20 MG PO TABS
20.0000 mg | ORAL_TABLET | Freq: Four times a day (QID) | ORAL | Status: AC | PRN
  Filled 2019-11-10: qty 1

## 2019-11-10 MED ORDER — CLONIDINE HCL 0.1 MG PO TABS
0.1000 mg | ORAL_TABLET | Freq: Two times a day (BID) | ORAL | Status: AC
  Administered 2019-11-10 – 2019-11-11 (×3): 0.1 mg via ORAL
  Filled 2019-11-10 (×3): qty 1

## 2019-11-10 MED ORDER — ORAL CARE MOUTH RINSE
15.0000 mL | Freq: Two times a day (BID) | OROMUCOSAL | Status: DC
  Administered 2019-11-10 – 2019-11-22 (×24): 15 mL via OROMUCOSAL

## 2019-11-10 MED ORDER — HYDROXYZINE HCL 25 MG PO TABS: 25.0000 mg | ORAL_TABLET | Freq: Four times a day (QID) | ORAL | Status: AC | PRN

## 2019-11-10 MED ORDER — RESOURCE THICKENUP CLEAR PO POWD
ORAL | Status: DC | PRN
  Filled 2019-11-10 (×2): qty 125

## 2019-11-10 MED ORDER — PROCHLORPERAZINE EDISYLATE 10 MG/2ML IJ SOLN: 5.0000 mg | Freq: Four times a day (QID) | INTRAMUSCULAR | Status: DC | PRN

## 2019-11-10 MED ORDER — HYDROXYZINE HCL 25 MG PO TABS: 25.0000 mg | ORAL_TABLET | Freq: Four times a day (QID) | ORAL | Status: DC | PRN

## 2019-11-10 MED ORDER — NICOTINE 21 MG/24HR TD PT24
21.0000 mg | MEDICATED_PATCH | Freq: Every day | TRANSDERMAL | Status: DC
  Administered 2019-11-11 – 2019-11-23 (×13): 21 mg via TRANSDERMAL
  Filled 2019-11-10 (×13): qty 1

## 2019-11-10 MED ORDER — ADULT MULTIVITAMIN W/MINERALS CH: 1.0000 | ORAL_TABLET | Freq: Every day | ORAL | Status: DC

## 2019-11-10 NOTE — H&P (Signed)
Physical Medicine and Rehabilitation Admission H&P    Chief Complaint  Patient presents with  . TBI    HPI: William Gibson" Budnick is a 74 year old male with history of RA,HTN, fatty liver due to ETOH abuse, gait disorder with shuffling flexed posture and increased in falls X a week who was admitted on 11/03/2019 after a fall in his driveway, where he struck his head.  History taken from chart review and daughter due to cognition.  Approximately 10 minutes LOC.  Thereafter, he reported headaches.  He was intoxicated on admission with a EtOH level of 190.  Imaging revealed bilateral mild to moderate SAH along bilateral tentorium and along right temporal and left frontal lobe as well as acute nondisplaced fracture of occipital region extending inferiorly to level of foramen magnum.  Dr. Vertell Limber was consulted for input and recommended conservative management.  Follow-up CT head was stable.  He was admitted for observation and developed EtOH withdrawal with DTs requiring CIWA protocol as well as Precedex.    PCCM has been following for support and patient's respiratory status has been stable. He was weaned off Precedex, tapered off Librium and required ativan due to hallucinations. He did develop somnolence post ativan dose. He was  placed on clonidine taper to complete detox due to ongoing issues with hallucinations and confusion.  His lethargy has resolved with improvement in mental status.  He continues to have issues with balance deficits with flexed posture and shuffling gait as well as decrease in right foot clearance, cognitive deficits with impairments in attention, moderate dysarthria with dysphagia.  Hospital course further complicated by dysphagia.  MBS done today and diet modified to dysphagia 1, thin liquids.  Therapy has been ongoing and CIR was recommended due to functional decline.  Please see preadmission assessment from earlier today as well.  Review of Systems  Constitutional: Negative  for chills and fever.  HENT: Positive for hearing loss. Negative for tinnitus.   Eyes: Negative for blurred vision and double vision.  Respiratory: Positive for cough. Negative for shortness of breath and stridor.   Cardiovascular: Negative for chest pain, palpitations and leg swelling.  Gastrointestinal: Negative for diarrhea and heartburn.  Musculoskeletal: Positive for joint pain (all over due to RA) and myalgias.  Skin: Negative for rash.  Neurological: Positive for speech change and headaches. Negative for focal weakness.  Psychiatric/Behavioral: The patient is not nervous/anxious and does not have insomnia.   All other systems reviewed and are negative.  Past Medical History:  Diagnosis Date  . Abnormal liver function tests 12/15/2018  . Alcoholic liver disease ([place]) 12/15/2018  . Alcoholism ([place])   . Elevated LDL cholesterol level 05/22/2013  . Epistaxis   . Erectile dysfunction 05/22/2013  . Frequent falls 11/09/2018  . GERD (gastroesophageal reflux disease)   . Hyperammonemia ([place]) 11/15/2018  . Memory change 08/17/2017  . RA (rheumatoid arthritis) ([place]) 08/17/2017  . RBBB 12/15/2018  . Rheumatoid arthritis ([place]) 2010  . Shuffling gait 12/19/2018  . Tobacco abuse 04/26/2012  . Weight loss, unintentional 11/09/2018  . 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 12/15/2018    Past Surgical History:  Procedure Laterality Date  . NASAL ENDOSCOPY WITH EPISTAXIS CONTROL N/A 04/12/2018   Procedure: NASAL ENDOSCOPY WITH EPISTAXIS CONTROL;  Surgeon: Melissa Montane, MD;  Location: [facility];  Service: ENT;  Laterality: N/A;  . VASECTOMY    . VASECTOMY REVERSAL      Family History  Problem Relation Age of Onset  .  Lung cancer Father   . Heart disease Mother   . Colon cancer Neg Hx   . Esophageal cancer Neg Hx   . Stomach cancer Neg Hx     Social History:  Married. 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 Wife is 77 yrs old?--still works. He reports that he has  been smoking cigarettes. He has a 87.00 pack-year smoking history. He has never used smokeless tobacco. Per reports drinks >1/5 of whiskey per day.  He reports that he does not use drugs.    Allergies: No Known Allergies    Medications Prior to Admission  Medication Sig Dispense Refill  . Cyanocobalamin (VITAMIN B-12 PO) Take 1 tablet by mouth daily.    . meloxicam (MOBIC) 15 MG tablet Take 15 mg by mouth daily.  2  . omeprazole (PRILOSEC OTC) 20 MG tablet Take 20 mg by mouth daily.    Marland Kitchen thiamine (VITAMIN B-1) 100 MG tablet Take 1 tablet (100 mg total) by mouth daily. 90 tablet 3  . chlordiazePOXIDE (LIBRIUM) 25 MG capsule 50mg  PO TID x 1D, then 25-50mg  PO BID X 1D, then 25-50mg  PO QD X 1D (Patient not taking: Reported on 11/03/2019) 15 capsule 0  . Cobalamin Combinations (VITAMIN B12-FOLIC ACID) 314-970 MCG TABS 1 tab by mouth daily. (Patient not taking: Reported on 11/03/2019) 90 tablet 3    Drug Regimen Review  Drug regimen was reviewed and remains appropriate with no significant issues identified  Home: Home Living Family/patient expects to be discharged to:: Unsure Living Arrangements: Spouse/significant other Available Help at Discharge: Family Type of Home: House Home Access: Stairs to enter Technical brewer of Steps: 3 Entrance Stairs-Rails: Right Home Layout: One level Bathroom Shower/Tub: Walk-in shower Home Equipment: Environmental consultant - 2 wheels, Other (comment), Shower seat Additional Comments: walking stick; states has a chair he can put into the shower   Functional History: Prior Function Level of Independence: Independent with assistive device(s) Comments: reports multiple falls at home, uses walking stick outside, enjoys yardwork, Daughter present today reports her mom can't get him up when he falls, pt reports he gets up on his own  Functional Status:  Mobility: Bed Mobility Overal bed mobility: Needs Assistance Bed Mobility: Sit to Supine, Supine to Sit Supine to  sit: Min guard, HOB elevated Sit to supine: Supervision General bed mobility comments: min guard for safety/lines Transfers Overall transfer level: Needs assistance Equipment used: Rolling walker (2 wheeled) Transfers: Sit to/from Stand Sit to Stand: Min guard Stand pivot transfers: Mod assist General transfer comment: assist for balance/lines Ambulation/Gait Ambulation/Gait assistance: Min assist, Mod assist Gait Distance (Feet): 150 Feet Assistive device: Rolling walker (2 wheeled) Gait Pattern/deviations: Step-through pattern, Decreased stride length, Shuffle, Trunk flexed, Decreased step length - right General Gait Details: cues but less facilitation for hip extension and head upright, still suffling, more with R, but only had to stop to reset balance and walker proximity when back in room    ADL: ADL Overall ADL's : Needs assistance/impaired Eating/Feeding: Minimal assistance, Sitting Eating/Feeding Details (indicate cue type and reason): assist to control portions and for swallowing precautions  Grooming: Wash/dry hands, Wash/dry face, Oral care, Moderate assistance, Standing Grooming Details (indicate cue type and reason): requires assist for thoroughness, and for balance  Upper Body Bathing: Moderate assistance, Sitting Lower Body Bathing: Moderate assistance, Sit to/from stand Upper Body Dressing : Moderate assistance, Sitting Lower Body Dressing: Maximal assistance, Sit to/from stand Toilet Transfer: Moderate assistance, +2 for safety/equipment, Ambulation, Comfort height  toilet, Grab bars Toileting- Clothing Manipulation and Hygiene: Total assistance, Sit to/from stand Functional mobility during ADLs: Moderate assistance, +2 for physical assistance, +2 for safety/equipment General ADL Comments: Pt unable to sustain attention to complete in or engage in ADL tasks   Cognition: Cognition Overall Cognitive Status: Impaired/Different from baseline Arousal/Alertness:  Lethargic Orientation Level: Oriented X4 Attention: Sustained Sustained Attention: Impaired Sustained Attention Impairment: Verbal basic Awareness: Impaired Awareness Impairment: Intellectual impairment, Emergent impairment, Anticipatory impairment Problem Solving: Impaired Problem Solving Impairment: Functional basic Behaviors: Impulsive Safety/Judgment: Impaired [employer] Scales of Cognitive Functioning: Confused/appropriate Cognition Arousal/Alertness: Awake/alert Behavior During Therapy: Flat affect Overall Cognitive Status: Impaired/Different from baseline Area of Impairment: Attention, Following commands, Safety/judgement Orientation Level: Disoriented to, Time, Situation, Place Current Attention Level: Selective Memory: Decreased short-term memory Following Commands: Follows one step commands consistently, Follows multi-step commands with increased time Safety/Judgement: Decreased awareness of safety, Decreased awareness of deficits Problem Solving: Slow processing, Difficulty sequencing, Requires verbal cues, Requires tactile cues General Comments: oriented to place, situation this pm and more alert and interactive   Blood pressure (P) 126/69, pulse (P) 63, temperature (P) 98.2 F (36.8 C), temperature source (P) Oral, resp. rate (P) 18, height 5\' 10"  (1.778 m), weight 74.8 kg, SpO2 (P) 99 %. Physical Exam Vitals and nursing note reviewed.  Constitutional:      General: He is not in acute distress.    Appearance: Normal appearance.  HENT:     Head: Normocephalic and atraumatic.     Right Ear: External ear normal.     Left Ear: External ear normal.     Nose: Nose normal.  Eyes:     General:        Right eye: No discharge.        Left eye: No discharge.     Extraocular Movements: Extraocular movements intact.  Cardiovascular:     Rate and Rhythm: Normal rate and regular rhythm.  Pulmonary:     Effort: Pulmonary effort is normal. No respiratory distress.      Breath sounds: No stridor.  Abdominal:     General: Abdomen is flat. There is no distension.     Palpations: Abdomen is soft.  Musculoskeletal:     Comments: No edema or tenderness in extremities  Skin:    General: Skin is warm and dry.  Neurological:     Mental Status: He is alert.     Comments: Dysarthria HOH Alert and oriented to person place and time, but unable to name President despite max cues  He was able to follow simple motor commands.                 Motor: 5/5 throughout      Psychiatric:        Speech: Speech is slurred.        Behavior: Behavior is slowed.     Results for orders placed or performed during the hospital encounter of 11/03/19 (from the past 48 hour(s))  Comprehensive metabolic panel     Status: Abnormal   Collection Time: 11/09/19  4:31 AM  Result Value Ref Range   Sodium 134 (L) 135 - 145 mmol/L   Potassium 4.1 3.5 - 5.1 mmol/L   Chloride 102 98 - 111 mmol/L   CO2 24 22 - 32 mmol/L   Glucose, Bld 112 (H) 70 - 99 mg/dL    Comment: Glucose reference range applies only to samples taken after fasting for at least 8 hours.   BUN 12  8 - 23 mg/dL   Creatinine, Ser 0.89 0.61 - 1.24 mg/dL   Calcium 8.5 (L) 8.9 - 10.3 mg/dL   Total Protein 6.5 6.5 - 8.1 g/dL   Albumin 2.7 (L) 3.5 - 5.0 g/dL   AST 38 15 - 41 U/L   ALT 35 0 - 44 U/L   Alkaline Phosphatase 59 38 - 126 U/L   Total Bilirubin 0.7 0.3 - 1.2 mg/dL   GFR calc non Af Amer >60 >60 mL/min   GFR calc Af Amer >60 >60 mL/min   Anion gap 8 5 - 15    Comment: Performed at [facility] [address], [place], [place] [address]  Comprehensive metabolic panel     Status: Abnormal   Collection Time: 11/10/19  3:52 AM  Result Value Ref Range   Sodium 134 (L) 135 - 145 mmol/L   Potassium 4.3 3.5 - 5.1 mmol/L   Chloride 103 98 - 111 mmol/L   CO2 23 22 - 32 mmol/L   Glucose, Bld 102 (H) 70 - 99 mg/dL    Comment: Glucose reference range applies only to samples taken after fasting for at least 8  hours.   BUN 12 8 - 23 mg/dL   Creatinine, Ser 0.78 0.61 - 1.24 mg/dL   Calcium 8.7 (L) 8.9 - 10.3 mg/dL   Total Protein 6.6 6.5 - 8.1 g/dL   Albumin 2.7 (L) 3.5 - 5.0 g/dL   AST 35 15 - 41 U/L   ALT 39 0 - 44 U/L   Alkaline Phosphatase 61 38 - 126 U/L   Total Bilirubin 0.7 0.3 - 1.2 mg/dL   GFR calc non Af Amer >60 >60 mL/min   GFR calc Af Amer >60 >60 mL/min   Anion gap 8 5 - 15    Comment: Performed at [facility] [address], [place], [place] [address]  CBC with Differential/Platelet     Status: Abnormal   Collection Time: 11/10/19  3:52 AM  Result Value Ref Range   WBC 9.6 4.0 - 10.5 K/uL   RBC 4.10 (L) 4.22 - 5.81 MIL/uL   Hemoglobin 13.7 13.0 - 17.0 g/dL   HCT 40.8 39 - 52 %   MCV 99.5 80.0 - 100.0 fL   MCH 33.4 26.0 - 34.0 pg   MCHC 33.6 30.0 - 36.0 g/dL   RDW 12.4 11.5 - 15.5 %   Platelets 169 150 - 400 K/uL   nRBC 0.0 0.0 - 0.2 %   Neutrophils Relative % 68 %   Neutro Abs 6.6 1.7 - 7.7 K/uL   Lymphocytes Relative 16 %   Lymphs Abs 1.5 0.7 - 4.0 K/uL   Monocytes Relative 10 %   Monocytes Absolute 1.0 0 - 1 K/uL   Eosinophils Relative 4 %   Eosinophils Absolute 0.3 0 - 0 K/uL   Basophils Relative 1 %   Basophils Absolute 0.1 0 - 0 K/uL   Immature Granulocytes 1 %   Abs Immature Granulocytes 0.06 0.00 - 0.07 K/uL    Comment: Performed at [facility] [address] [place], [place] [address]  Magnesium     Status: None   Collection Time: 11/10/19  3:52 AM  Result Value Ref Range   Magnesium 1.8 1.7 - 2.4 mg/dL    Comment: Performed at [facility] [address], [place], [place] [address]   No results found.     Medical Problem List and Plan: 1.  Balance deficits, flexed posture, shuffling gait, cognitive deficits, impairments in attention, dysphagia secondary to TBI with bilateral SAH.  -patient may not shower  -ELOS/Goals: 7-10 days/supervision  Admit to CIR 2.  Antithrombotics: -DVT/anticoagulation:  Mechanical: Sequential  compression devices, below knee Bilateral lower extremities  -antiplatelet therapy: N/A 3. Persistent Headaches/Pain Management: Getting a little better --continue Oxycodone prn.   Will plan to transition to nonnarcotics given history. 4. Mood: LCSW to follow for evaluation and support.   -antipsychotic agents: N/A 5. Neuropsych: This patient is not capable of making decisions on his own behalf. 6. Skin/Wound Care: Routine Routine pressure relief measures.  7. Fluids/Electrolytes/Nutrition: Monitor I/O.  CMP ordered. 8. Alcohol abuse with DTs: Continue thiamine and folic acid. On clonidine taper--BP stable.    Continue to monitor closely. 9.  Dysphagia: Diet modified to dysphagia 1, thin liquids on 07/16.  Advance diet as tolerated 10. H/o RA: Managed with Meloxicam-- was resumed on 07/15. Monitor for GI irritation or signs of bleeding.  11. Hyponatremia/Hypomagnesemia: Likely due to ETOH abuse.   CMP ordered. 12. Hepatic steatosis/Chronic pancreatitis: Continue Thiamine and Folic acid.   CMP ordered.  Bary Leriche, PA-C 11/10/2019  I have personally performed a face to face diagnostic evaluation, including, but not limited to relevant history and physical exam findings, of this patient and developed relevant assessment and plan.  Additionally, I have reviewed and concur with the physician assistant's documentation above.  Delice Lesch, MD, ABPMR

## 2019-11-10 NOTE — Progress Notes (Signed)
Inpatient Rehabilitation Admissions Coordinator  I have insurance approval to admit patient to today. I met with patient with his daughter, [name] at bedside and wife via phone. All in agreement to admit. I will notify Dr. Starla Link and [facility] team to make the arrangements to admit today.  Danne Baxter, RN, MSN Rehab Admissions Coordinator [phone] 11/10/2019 11:03 AM

## 2019-11-10 NOTE — Progress Notes (Signed)
William Arn, MD  Physician  Physical Medicine and Rehabilitation  PMR Pre-admission     Addendum  Date of Service:  11/10/2019 11:09 AM      Related encounter: ED to Hosp-Admission (Current) from 11/03/2019 in [facility]       Show:Clear all [x] Manual[x] Template[x] Copied  Added by: [x] Cristina Gong, RN  [] Hover for details PMR Admission Coordinator Pre-Admission Assessment   Patient: William Gibson is an 74 y.o., male MRN: [id number] DOB: January 19, 1946 Height: 5\' 10"  (177.8 cm) Weight: 74.8 kg                                                                                                                                                  Insurance Information HMO:     PPO: yes     PCP:      IPA:      80/20:      OTHER:  PRIMARY: [employer] commercial      Policy#: [id number]      Subscriber: wife CM Name: Gildardo Cranker      Phone#: [phone]     Fax#: [phone] Pre-Cert#: [id number] for 7 days with F/u Celene Skeen phone [phone] ext [phone] fax [phone]      Employer: [employer] Benefits:  Phone #: [phone]     Name: 7/15 Eff. Date: 04/28/2019     Deduct: $500      Out of Pocket Max: $2000      Life Max: none  CIR: 80%      SNF: 80% 100 days Outpatient: 80%     Co-Pay: 205 Home Health: 80%      Co-Pay: 20% DME: 80%     Co-Pay: 205 Providers: in network  SECONDARY: Medicare a and b      Policy#: [id number]     Active 04/27/2010   Financial Counselor:       Phone#:    The "Data Collection Information Summary" for patients in Inpatient Rehabilitation Facilities with attached "Privacy Act [place] Records" was provided and verbally reviewed with: Patient and Family   Emergency Contact Information Contact Information     Name Relation Home Work Mobile    [name] Spouse     [phone]    Blanchard Mane Daughter     [phone]       Current Medical History  Patient Admitting Diagnosis: SAH after fall     History of Present Illness:74 y.o. male with history of HTN, RA, fatty liver, ETOH abuse, shuffling gait flexed gait as well as increase in falls for about a week with last fall resulting with LOC ~ 10 minutes on 07/09 and reports of headache.  He was intoxicated at admission with ETOH level 190 and CT head done revealing bilateral mild to moderate SAH along bilateral tentorium and along right temporal  and left frontal lobe as well as cute non-displaced fracture occipital region extending inferiorly to level of foramen magnum. Dr. Vertell Limber recommended conservative management and follow up CT head stable. He was admitted for observation and developed ETOH withdrawal with DT's requiring CIWA protocol as well as Precedex. PCCM following for support and patient's respiratory status has been stable and was  weaned off Precedex by 07/13. Therapy ongoing and patient continues to be limited by balance deficits with poor safety awareness, impulsivity and lethargy.      Discussed with daughter, patient very active doing yard work but had multiple falls mainly when outside prior to admission.  He was found on the driveway prior to going to the hospital. The patient indicates that he has had some shoulder and finger pain and limitations related to his rheumatoid arthritis. He denies numbness or tingling in the hands or the feet.    Past Medical History      Past Medical History:  Diagnosis Date  . Abnormal liver function tests 12/15/2018  . Alcoholic liver disease ([place]) 12/15/2018  . Alcoholism ([place])    . Elevated LDL cholesterol level 05/22/2013  . Epistaxis    . Erectile dysfunction 05/22/2013  . Frequent falls 11/09/2018  . GERD (gastroesophageal reflux disease)    . Hyperammonemia ([place]) 11/15/2018  . Memory change 08/17/2017  . RA (rheumatoid arthritis) ([place]) 08/17/2017  . RBBB 12/15/2018  . Rheumatoid arthritis ([place]) 2010  . Shuffling gait 12/19/2018  . Tobacco abuse 04/26/2012  . Weight loss, unintentional  11/09/2018  . 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 12/15/2018      Family History  family history includes Heart disease in his mother; Lung cancer in his father.   Prior Rehab/Hospitalizations:  Has the patient had prior rehab or hospitalizations prior to admission? Yes   Has the patient had major surgery during 100 days prior to admission? No   Current Medications    Current Facility-Administered Medications:  .  Chlorhexidine Gluconate Cloth 2 % PADS 6 each, 6 each, Topical, Daily, Alma Friendly, MD, 6 each at 11/09/19 [phone] .  [COMPLETED] cloNIDine (CATAPRES) tablet 0.1 mg, 0.1 mg, Oral, QID, 0.1 mg at 11/09/19 2155 **FOLLOWED BY** cloNIDine (CATAPRES) tablet 0.1 mg, 0.1 mg, Oral, BH-qamhs **FOLLOWED BY** [START ON 11/12/2019] cloNIDine (CATAPRES) tablet 0.1 mg, 0.1 mg, Oral, QAC breakfast, Ramaswamy, Murali, MD .  dicyclomine (BENTYL) tablet 20 mg, 20 mg, Oral, Q6H PRN, Brand Males, MD .  folic acid (FOLVITE) tablet 1 mg, 1 mg, Oral, Daily, 1 mg at 82/50/53 9767 **OR** folic acid injection 1 mg, 1 mg, Intravenous, Daily, Brand Males, MD, 1 mg at 11/10/19 0943 .  hydrALAZINE (APRESOLINE) tablet 25 mg, 25 mg, Oral, Q8H PRN, Alma Friendly, MD, 25 mg at 11/05/19 0948 .  hydrOXYzine (ATARAX/VISTARIL) tablet 25 mg, 25 mg, Oral, Q6H PRN, Brand Males, MD .  loperamide (IMODIUM) capsule 2-4 mg, 2-4 mg, Oral, PRN, Brand Males, MD .  MEDLINE mouth rinse, 15 mL, Mouth Rinse, BID, Chase Caller, Murali, MD, 15 mL at 11/10/19 0944 .  meloxicam (MOBIC) tablet 15 mg, 15 mg, Oral, Daily, Alekh, Kshitiz, MD, 15 mg at 11/10/19 0944 .  menthol-cetylpyridinium (CEPACOL) lozenge 3 mg, 1 lozenge, Oral, PRN, Starla Link, Kshitiz, MD, 3 mg at 11/09/19 1421 .  methocarbamol (ROBAXIN) tablet 500 mg, 500 mg, Oral, Q8H PRN, Brand Males, MD .  multivitamin with minerals tablet 1 tablet, 1 tablet, Oral, Daily, Chotiner, Yevonne Aline, MD, 1 tablet at 11/10/19 0946 .  naproxen  (NAPROSYN) tablet 500 mg, 500 mg, Oral, BID PRN, Brand Males, MD, 500 mg at 11/09/19 1008 .  nicotine (NICODERM CQ - dosed in mg/24 hours) patch 21 mg, 21 mg, Transdermal, Daily, Alma Friendly, MD, 21 mg at 11/10/19 0945 .  ondansetron (ZOFRAN-ODT) disintegrating tablet 4 mg, 4 mg, Oral, Q6H PRN, Chase Caller, Murali, MD .  oxyCODONE (Oxy IR/ROXICODONE) immediate release tablet 5 mg, 5 mg, Oral, Q8H PRN, Alma Friendly, MD, 5 mg at 11/08/19 0603 .  pantoprazole (PROTONIX) EC tablet 40 mg, 40 mg, Oral, Daily, Alma Friendly, MD, 40 mg at 11/10/19 0943 .  Resource ThickenUp Clear, , Oral, PRN, Brand Males, MD .  sodium chloride flush (NS) 0.9 % injection 3 mL, 3 mL, Intravenous, Q12H, Erline Levine, MD, 3 mL at 11/09/19 2156 .  thiamine tablet 100 mg, 100 mg, Oral, Daily, 100 mg at 11/07/19 0950 **OR** thiamine (B-1) injection 100 mg, 100 mg, Intravenous, Daily, Chotiner, Yevonne Aline, MD, 100 mg at 11/10/19 4098   Patients Current Diet:     Diet Order                 DIET DYS 2 Room service appropriate? Yes with Assist; Fluid consistency: Nectar Thick  Diet effective now                      Precautions / Restrictions Precautions Precautions: Fall Restrictions Weight Bearing Restrictions: No    Has the patient had 2 or more falls or a fall with injury in the past year?Yes   Prior Activity Level Community (5-7x/wk): Independent. Active in yard work but with multiple falls. Uses Walking stick in the yard.   Prior Functional Level Prior Function Level of Independence: Independent with assistive device(s) Comments: reports multiple falls at home, uses walking stick outside, enjoys yardwork, Daughter present today reports her mom can't get him up when he falls, pt reports he gets up on his own   Self Care: Did the patient need help bathing, dressing, using the toilet or eating?  Independent   Indoor Mobility: Did the patient need assistance with walking from  room to room (with or without device)? Independent   Stairs: Did the patient need assistance with internal or external stairs (with or without device)? Independent   Functional Cognition: Did the patient need help planning regular tasks such as shopping or remembering to take medications? [place] / Equipment Home Assistive Devices/Equipment: Other (Comment) (walking stick) Home Equipment: Walker - 2 wheels, Other (comment), Shower seat   Prior Device Use: Indicate devices/aids used by the patient prior to current illness, exacerbation or injury? walking stick used in the yard   Current Functional Level Cognition   Arousal/Alertness: Lethargic Overall Cognitive Status: Impaired/Different from baseline Current Attention Level: Selective Orientation Level: Oriented X4 Following Commands: Follows one step commands consistently, Follows multi-step commands with increased time Safety/Judgement: Decreased awareness of safety, Decreased awareness of deficits General Comments: oriented to place, situation this pm and more alert and interactive Attention: Sustained Sustained Attention: Impaired Sustained Attention Impairment: Verbal basic Awareness: Impaired Awareness Impairment: Intellectual impairment, Emergent impairment, Anticipatory impairment Problem Solving: Impaired Problem Solving Impairment: Functional basic Behaviors: Impulsive Safety/Judgment: Impaired Rancho Los Amigos Scales of Cognitive Functioning: Confused/appropriate    Extremity Assessment (includes Sensation/Coordination)   Upper Extremity Assessment: Overall WFL for tasks assessed  Lower Extremity Assessment: Defer to PT evaluation     ADLs   Overall ADL's : Needs  assistance/impaired Eating/Feeding: Minimal assistance, Sitting Eating/Feeding Details (indicate cue type and reason): assist to control portions and for swallowing precautions  Grooming: Wash/dry hands, Wash/dry face, Oral  care, Moderate assistance, Standing Grooming Details (indicate cue type and reason): requires assist for thoroughness, and for balance  Upper Body Bathing: Moderate assistance, Sitting Lower Body Bathing: Moderate assistance, Sit to/from stand Upper Body Dressing : Moderate assistance, Sitting Lower Body Dressing: Maximal assistance, Sit to/from stand Toilet Transfer: Moderate assistance, +2 for safety/equipment, Ambulation, Comfort height toilet, Grab bars Toileting- Clothing Manipulation and Hygiene: Total assistance, Sit to/from stand Functional mobility during ADLs: Moderate assistance, +2 for physical assistance, +2 for safety/equipment General ADL Comments: Pt unable to sustain attention to complete in or engage in ADL tasks      Mobility   Overal bed mobility: Needs Assistance Bed Mobility: Sit to Supine, Supine to Sit Supine to sit: Min guard, HOB elevated Sit to supine: Supervision General bed mobility comments: min guard for safety/lines     Transfers   Overall transfer level: Needs assistance Equipment used: Rolling walker (2 wheeled) Transfers: Sit to/from Stand Sit to Stand: Min guard Stand pivot transfers: Mod assist General transfer comment: assist for balance/lines     Ambulation / Gait / Stairs / Wheelchair Mobility   Ambulation/Gait Ambulation/Gait assistance: Min assist, Mod assist Gait Distance (Feet): 150 Feet Assistive device: Rolling walker (2 wheeled) Gait Pattern/deviations: Step-through pattern, Decreased stride length, Shuffle, Trunk flexed, Decreased step length - right General Gait Details: cues but less facilitation for hip extension and head upright, still suffling, more with R, but only had to stop to reset balance and walker proximity when back in room     Posture / Balance Dynamic Sitting Balance Sitting balance - Comments: reaching for toilet hygiene without LOB Balance Overall balance assessment: Needs assistance Sitting-balance support: Feet  supported Sitting balance-Leahy Scale: Good Sitting balance - Comments: reaching for toilet hygiene without LOB Standing balance support: Bilateral upper extremity supported Standing balance-Leahy Scale: Poor Standing balance comment: at bedside standing with minguard A no UE support initially High Level Balance Comments: side stepping at bedside with UE support, cues for stepping higher on R and mod A to prevent lateral LOB and cues for upright posture     Special needs/care consideration   Designated visitors are wife, [name] and daughter, Odetta Pink protocol on acute    Previous Home Environment  Living Arrangements: Spouse/significant other  Lives With: Spouse Available Help at Discharge: Family, Available 24 hours/day Type of Home: House Home Layout: One level Home Access: Stairs to enter Entrance Stairs-Rails: Right Entrance Stairs-Number of Steps: 3 Bathroom Shower/Tub: Multimedia programmer: Standard Bathroom Accessibility: Yes How Accessible: Accessible via walker Home Care Services: No Additional Comments: walking stick; states has a chair he can put into the shower   Discharge Living Setting Plans for Discharge Living Setting: Patient's home, Lives with (comment) (spouse) Type of Home at Discharge: House Discharge Home Layout: One level Discharge Home Access: Stairs to enter Entrance Stairs-Rails: Right Entrance Stairs-Number of Steps: 3 Discharge Bathroom Shower/Tub: Walk-in shower Discharge Bathroom Toilet: Standard Discharge Bathroom Accessibility: Yes How Accessible: Accessible via walker Does the patient have any problems obtaining your medications?: No   Social/Family/Support Systems Patient Roles: Spouse, Parent Contact Information: wife, [name] and daughter, [name] Anticipated Caregiver: wife and daughters Anticipated Caregiver's Contact Information: see above Ability/Limitations of Caregiver: wife works but will make  arrangements Caregiver Availability: 24/7 Discharge Plan Discussed with Primary Caregiver: Yes Is Caregiver In  Agreement with Plan?: Yes Does Caregiver/Family have Issues with Lodging/Transportation while Pt is in Rehab?: No   Goals Patient/Family Goal for Rehab: supervision PT, OT, and SLP Expected length of stay: ELOS 10 to 14 days Pt/Family Agrees to Admission and willing to participate: Yes Program Orientation Provided & Reviewed with Pt/Caregiver Including Roles  & Responsibilities: Yes   Decrease burden of Care through IP rehab admission: n/a   Possible need for SNF placement upon discharge:not anticipated   Patient Condition: This patient's condition remains as documented in the consult dated 11/09/2019, in which the Rehabilitation Physician determined and documented that the patient's condition is appropriate for intensive rehabilitative care in an inpatient rehabilitation facility. Will admit to inpatient rehab today.   Preadmission Screen Completed By:  Cleatrice Burke, RN, 11/10/2019 11:09 AM ______________________________________________________________________   Discussed status with Dr. Posey Pronto on 11/10/2019 at  1122 and received approval for admission today.   Admission Coordinator:  Cleatrice Burke, time 6294 Date 11/10/2019         Revision History      Note Details  Author William Arn, MD File Time 11/10/2019 11:23 AM  Author Type Physician Status Addendum  Last Editor William Arn, MD Service Physical Medicine and Rehabilitation

## 2019-11-10 NOTE — Progress Notes (Signed)
Modified Barium Swallow Progress Note  Patient Details  Name: William Gibson MRN: [id number] Date of Birth: 01-20-1946  Today's Date: 11/10/2019  Modified Barium Swallow completed.  Full report located under Chart Review in the Imaging Section.  Brief recommendations include the following:  Clinical Impression   Pt has a moderate oropharyngeal dysphagia secondary to impaired timing, coordination, and strength. His oral preparation is disorganized, not collecting liquids into well formed boluses. There is sublingual and posterior spilling with mild oral residue. He also has reduced hyolaryngeal movement, epiglottic inversion, base of tongue retraction, and laryngeal vestibule closure, so when nectar thick liquids reach beyond his epiglottis before the swallow, he silently aspirates. He also aspirates after the swallow on moderate amounts of residue left from thickened liquids and soft solids. A chin tuck does not significantly change function, but he has improved bolus cohesion, timing, and pharyngeal efficiency when cued to "swallow hard" with purees and nectar thick liquids via spoon. Recommend downgrading diet to purees and continuing nectar thick liquids by spoon only. Full supervision should be provided to provide cues to swallow hard throughout meals to further increase safety. Daughter, [name], was also present and educated on results.    Swallow Evaluation Recommendations       SLP Diet Recommendations: Dysphagia 1 (Puree) solids;Nectar thick liquid   Liquid Administration via: Spoon   Medication Administration: Crushed with puree   Supervision: Patient able to self feed;Full supervision/cueing for compensatory strategies   Compensations: Slow rate;Small sips/bites;Minimize environmental distractions;Effortful swallow   Postural Changes: Seated upright at 90 degrees   Oral Care Recommendations: Oral care QID   Other Recommendations: Order thickener from pharmacy;Prohibited  food (jello, ice cream, thin soups);Remove water pitcher    Osie Bond., M.A. [place] Pager [phone] Office [phone]  11/10/2019,1:51 PM

## 2019-11-10 NOTE — Discharge Summary (Signed)
Physician Discharge Summary  DEEP BONAWITZ KYH:[id number] DOB: [birth year] DOA: 11/03/2019  PCP: Patient, No Pcp Per  Admit date: 11/03/2019 Discharge date: 11/10/2019  Admitted From: Home Disposition: CIR  Recommendations for Outpatient Follow-up:  1. Follow up with CIR provider earliest convenience 2. Recommend outpatient neurology evaluation 3. Follow up in ED if symptoms worsen or new appear   Home Health: No Equipment/Devices: None  Discharge Condition: Stable CODE STATUS: Full Diet recommendation: Heart healthy  Brief/Interim Summary: 74 year old male with history of alcohol abuse, alcoholic liver disease, tobacco use, rheumatoid arthritis and hearing loss presented with a fall with resulting subarachnoid hemorrhage.  He was seen by neurosurgery with no surgical intervention indicated.  Subsequently he developed worsening alcohol withdrawal with DTs.  Because of worsening DTs, PCCM was consulted and patient was started on Precedex drip.  He was transferred to ICU.  Subsequently, Precedex drip has been discontinued.  He was transferred back to [facility] service on 11/09/2019.  PT recommended CIR placement.  He will be discharged to CIR once bed is available.  Discharge Diagnoses:   Alcohol abuse with alcohol withdrawal and DTs with acute toxic encephalopathy -Patient was started on CIWA protocol and Librium and as needed Ativan but because of worsening DTs, he was transferred to ICU and started on Precedex drip under ICU care.  -Subsequently, Precedex drip has been discontinued.  He was transferred back to [facility] service on 11/09/2019 -Mental status improving.  Currently on clonidine taper protocol.  Continue CIWA with folic acid, multivitamin and thiamine. -Fall precautions.  Monitor mental status.  Fall with subarachnoid hemorrhage and basilar skull fracture -No need for surgical intervention as per neurosurgery.  Repeat head CT without worsening bleed.  Neurosurgery following. -Fall  precautions.  Mild hyponatremia -Stable currently.  Mildly elevated LFTs  -improving  History of RA -Followed by Dr. Gavin Pound.  On meloxicam alone as an outpatient.  Continue meloxicam.  Outpatient follow-up with PCP/rheumatology for DMARD needs  Frequent falls  -Probably from alcohol abuse/deconditioning as well as RA.  PT/OT recommends CIR.  CIR consulted.  Patient is medically stable for discharge to CIR. -Outpatient neurology evaluation.   Discharge Instructions  Allergies as of 11/10/2019   No Known Allergies     Medication List    STOP taking these medications   chlordiazePOXIDE 25 MG capsule Commonly known as: LIBRIUM   Vitamin B12-Folic Acid 151-761 MCG Tabs     TAKE these medications   cloNIDine 0.1 MG tablet Commonly known as: CATAPRES BID till 11/12/19 then daily for 2 days then stop   folic acid 1 MG tablet Commonly known as: FOLVITE Take 1 tablet (1 mg total) by mouth daily. Start taking on: November 11, 2019   hydrOXYzine 25 MG tablet Commonly known as: ATARAX/VISTARIL Take 1 tablet (25 mg total) by mouth every 6 (six) hours as needed for anxiety.   meloxicam 15 MG tablet Commonly known as: MOBIC Take 15 mg by mouth daily.   multivitamin with minerals Tabs tablet Take 1 tablet by mouth daily. Start taking on: November 11, 2019   omeprazole 20 MG tablet Commonly known as: PRILOSEC OTC Take 20 mg by mouth daily.   thiamine 100 MG tablet Commonly known as: Vitamin B-1 Take 1 tablet (100 mg total) by mouth daily.   VITAMIN B-12 PO Take 1 tablet by mouth daily.         No Known Allergies  Consultations:  PCCM/neurosurgery   Procedures/Studies: CT HEAD WO CONTRAST  Result Date: 11/04/2019  CLINICAL DATA:  Subarachnoid hemorrhage EXAM: CT HEAD WITHOUT CONTRAST TECHNIQUE: Contiguous axial images were obtained from the base of the skull through the vertex without intravenous contrast. COMPARISON:  Head CT 11/03/2019 FINDINGS: Brain:  Unchanged pattern of subarachnoid hemorrhage over both cerebral hemispheres. Right convexity small subdural hemorrhage is also unchanged. No new site of hemorrhage. No midline shift or other mass effect. Vascular: No abnormal hyperdensity of the major intracranial arteries or dural venous sinuses. No intracranial atherosclerosis. Skull: Unchanged appearance of nondisplaced parasagittal occipital fracture. Sinuses/Orbits: No fluid levels or advanced mucosal thickening of the visualized paranasal sinuses. No mastoid or middle ear effusion. The orbits are normal. IMPRESSION: Unchanged pattern of subarachnoid and subdural hemorrhage. Electronically Signed   By: Ulyses Jarred M.D.   On: 11/04/2019 05:32   CT Head Wo Contrast  Addendum Date: 11/03/2019   ADDENDUM REPORT: 11/03/2019 21:38 ADDENDUM: Upon further evaluation, an acute nondisplaced fracture is seen involving the occipital region of the skull, to the right of midline. This extends inferiorly along the skull base to the level of the foramen magnum. Electronically Signed   By: Virgina Norfolk M.D.   On: 11/03/2019 21:38   Result Date: 11/03/2019 CLINICAL DATA:  Status post fall. EXAM: CT HEAD WITHOUT CONTRAST TECHNIQUE: Contiguous axial images were obtained from the base of the skull through the vertex without intravenous contrast. COMPARISON:  November 09, 2018 FINDINGS: Brain: There is mild cerebral atrophy with widening of the extra-axial spaces and ventricular dilatation. There are areas of decreased attenuation within the white matter tracts of the supratentorial brain, consistent with microvascular disease changes. A mild-to-moderate amount of subarachnoid blood is seen along the tentorium cerebelli, bilaterally, as well as along the anterior aspect of the right temporal lobe and left frontal lobe. Involvement of the right temporoparietal region is also seen. There is no evidence of associated mass effect or midline shift. Vascular: No hyperdense vessel  or unexpected calcification. Skull: Normal. Negative for fracture or focal lesion. Sinuses/Orbits: No acute finding. Other: Tiny metallic density foci are seen adjacent to the outer table of the skull along the right frontal region. These are present on the prior study. IMPRESSION: 1. Mild-to-moderate amount of subarachnoid blood along the tentorium cerebelli, bilaterally, as well as along the right temporoparietal region, anterior aspect of the right temporal lobe and left frontal lobe. MRI correlation is recommended. 2. No evidence of associated mass effect or midline shift. Electronically Signed: By: Virgina Norfolk M.D. On: 11/03/2019 21:27   CT Cervical Spine Wo Contrast  Result Date: 11/03/2019 CLINICAL DATA:  Status post fall. EXAM: CT CERVICAL SPINE WITHOUT CONTRAST TECHNIQUE: Multidetector CT imaging of the cervical spine was performed without intravenous contrast. Multiplanar CT image reconstructions were also generated. COMPARISON:  None. FINDINGS: Alignment: Normal. Skull base and vertebrae: No acute fracture involving the cervical spine. An acute nondisplaced fracture is seen involving the occipital region of the skull, to the right of midline. This extends inferiorly along the skull base to the level of the foramen magnum. Soft tissues and spinal canal: No prevertebral fluid or swelling. No visible canal hematoma. Disc levels: There is marked severity narrowing of the anterior atlanto axial articulation. Mild anterior osteophyte formation is seen at the levels of C3-C4, C4-C5, C5-C6 and C6-C7. Normal multilevel intervertebral disc spaces are seen. Mild bilateral multilevel facet joint hypertrophy is seen. Upper chest: Mild to moderate severity chronic appearing increased lung markings are present. Other: None. IMPRESSION: 1. Acute nondisplaced fracture involving the occipital region of  the skull, to the right of midline. This extends inferiorly along the skull base to the level of the foramen  magnum. 2. No acute fracture involving the cervical spine. Electronically Signed   By: Virgina Norfolk M.D.   On: 11/03/2019 21:38   DG CHEST PORT 1 VIEW  Result Date: 11/07/2019 CLINICAL DATA:  Tachypneic EXAM: PORTABLE CHEST 1 VIEW COMPARISON:  11/03/2019 FINDINGS: Single frontal view of the chest demonstrates an unremarkable cardiac silhouette. No airspace disease, effusion, or pneumothorax. No acute bony abnormalities. IMPRESSION: 1. Stable exam, no acute process. Electronically Signed   By: Randa Ngo M.D.   On: 11/07/2019 19:54   DG Chest Portable 1 View  Result Date: 11/03/2019 CLINICAL DATA:  [place], syncope, dizziness, alcohol abuse EXAM: PORTABLE CHEST 1 VIEW COMPARISON:  09/26/2014 FINDINGS: Single frontal view of the chest demonstrates an unremarkable cardiac silhouette. No airspace disease, effusion, or pneumothorax. No acute bony abnormalities. IMPRESSION: 1. No acute intrathoracic process. Electronically Signed   By: Randa Ngo M.D.   On: 11/03/2019 21:42      Subjective: Patient seen and examined at bedside.  Poor historian.  No fever, vomiting, worsening shortness of breath or seizures reported.  Discharge Exam: Vitals:   11/10/19 0434 11/10/19 0759  BP: (!) 141/84 (P) 126/69  Pulse: 62 (P) 63  Resp: 18 (P) 18  Temp: 97.7 F (36.5 C) (P) 98.2 F (36.8 C)  SpO2: 98% (P) 99%    General exam: No acute distress.  Looks chronically ill and deconditioned. Respiratory system: Bilateral decreased breath sounds at bases with some crackles.  No wheezing  cardiovascular system: S1 & S2 heard, currently rate controlled Gastrointestinal system: Abdomen is nondistended, soft and nontender.  Bowel sounds are heard  extremities: No edema or clubbing    The results of significant diagnostics from this hospitalization (including imaging, microbiology, ancillary and laboratory) are listed below for reference.     Microbiology: Recent Results (from the past 240 hour(s))   SARS Coronavirus 2 by RT PCR (hospital order, performed in [facility] hospital lab) Nasopharyngeal Nasopharyngeal Swab     Status: None   Collection Time: 11/03/19 10:38 PM   Specimen: Nasopharyngeal Swab  Result Value Ref Range Status   SARS Coronavirus 2 NEGATIVE NEGATIVE Final    Comment: (NOTE) SARS-CoV-2 target nucleic acids are NOT DETECTED.  The SARS-CoV-2 RNA is generally detectable in upper and lower respiratory specimens during the acute phase of infection. The lowest concentration of SARS-CoV-2 viral copies this assay can detect is 250 copies / mL. A negative result does not preclude SARS-CoV-2 infection and should not be used as the sole basis for treatment or other patient management decisions.  A negative result may occur with improper specimen collection / handling, submission of specimen other than nasopharyngeal swab, presence of viral mutation(s) within the areas targeted by this assay, and inadequate number of viral copies (<250 copies / mL). A negative result must be combined with clinical observations, patient history, and epidemiological information.  Fact Sheet for Patients:   StrictlyIdeas.no  Fact Sheet for Healthcare Providers: BankingDealers.co.za  This test is not yet approved or  cleared by the [place] FDA and has been authorized for detection and/or diagnosis of SARS-CoV-2 by FDA under an Emergency Use Authorization (EUA).  This EUA will remain in effect (meaning this test can be used) for the duration of the COVID-19 declaration under Section 564(b)(1) of the Act, 21 U.S.C. section 360bbb-3(b)(1), unless the authorization is terminated or revoked sooner.  Performed at [facility], [place] [address] [place], [place] [address]   MRSA PCR Screening     Status: None   Collection Time: 11/04/19  3:01 PM   Specimen: Nasopharyngeal Wash  Result Value Ref Range Status   MRSA by PCR NEGATIVE  NEGATIVE Final    Comment:        The GeneXpert MRSA Assay (FDA approved for NASAL specimens only), is one component of a comprehensive MRSA colonization surveillance program. It is not intended to diagnose MRSA infection nor to guide or monitor treatment for MRSA infections. Performed at [facility], [place] [address] [place], [place] [address]      Labs: BNP (last 3 results) No results for input(s): BNP in the last 8760 hours. Basic Metabolic Panel: Recent Labs  Lab 11/04/19 0225 11/04/19 0225 11/05/19 4097 11/05/19 3532 11/06/19 0406 11/07/19 9924 11/08/19 0352 11/09/19 0431 11/10/19 0352  NA 135   < > 132*   < > 133* 134* 134* 134* 134*  K 4.4   < > 4.5   < > 4.1 4.1 3.5 4.1 4.3  CL 103   < > 100   < > 101 102 100 102 103  CO2 20*   < > 24   < > 24 22 21* 24 23  GLUCOSE 129*   < > 141*   < > 117* 103* 123* 112* 102*  BUN 6*   < > 6*   < > 9 14 14 12 12   CREATININE 0.83   < > 0.73   < > 0.85 0.81 0.76 0.89 0.78  CALCIUM 8.8*   < > 8.9   < > 9.0 9.3 8.7* 8.5* 8.7*  MG 1.6*  --  2.0  --   --  1.7 1.8  --  1.8  PHOS 3.4  --   --   --   --  3.1 3.0  --   --    < > = values in this interval not displayed.   Liver Function Tests: Recent Labs  Lab 11/06/19 0406 11/07/19 0337 11/08/19 0352 11/09/19 0431 11/10/19 0352  AST 31 42* 34 38 35  ALT 36 41 37 35 39  ALKPHOS 69 67 66 59 61  BILITOT 1.3* 1.2 1.0 0.7 0.7  PROT 7.0 7.3 7.2 6.5 6.6  ALBUMIN 3.0* 3.0* 2.9* 2.7* 2.7*   Recent Labs  Lab 11/03/19 2059  LIPASE 43   No results for input(s): AMMONIA in the last 168 hours. CBC: Recent Labs  Lab 11/03/19 2059 11/04/19 0225 11/05/19 0313 11/07/19 0337 11/10/19 0352  WBC 8.1 9.2 9.9 9.7 9.6  NEUTROABS  --   --  7.5  --  6.6  HGB 13.7 14.3 14.0 14.6 13.7  HCT 41.2 42.3 41.6 43.5 40.8  MCV 99.8 98.6 99.5 98.9 99.5  PLT 219 229 223 209 169   Cardiac Enzymes: No results for input(s): CKTOTAL, CKMB, CKMBINDEX, TROPONINI in the last 168  hours. BNP: Invalid input(s): POCBNP CBG: No results for input(s): GLUCAP in the last 168 hours. D-Dimer No results for input(s): DDIMER in the last 72 hours. Hgb A1c No results for input(s): HGBA1C in the last 72 hours. Lipid Profile No results for input(s): CHOL, HDL, LDLCALC, TRIG, CHOLHDL, LDLDIRECT in the last 72 hours. Thyroid function studies No results for input(s): TSH, T4TOTAL, T3FREE, THYROIDAB in the last 72 hours.  Invalid input(s): FREET3 Anemia work up No results for input(s): VITAMINB12, FOLATE, FERRITIN, TIBC, IRON, RETICCTPCT  in the last 72 hours. Urinalysis    Component Value Date/Time   COLORURINE YELLOW 11/04/2019 0935   APPEARANCEUR CLEAR 11/04/2019 0935   LABSPEC 1.013 11/04/2019 0935   PHURINE 7.0 11/04/2019 0935   GLUCOSEU NEGATIVE 11/04/2019 0935   GLUCOSEU NEGATIVE 11/10/2018 0917   HGBUR NEGATIVE 11/04/2019 0935   BILIRUBINUR NEGATIVE 11/04/2019 0935   KETONESUR NEGATIVE 11/04/2019 0935   PROTEINUR NEGATIVE 11/04/2019 0935   UROBILINOGEN 0.2 11/10/2018 0917   NITRITE NEGATIVE 11/04/2019 0935   LEUKOCYTESUR NEGATIVE 11/04/2019 0935   Sepsis Labs Invalid input(s): PROCALCITONIN,  WBC,  LACTICIDVEN Microbiology Recent Results (from the past 240 hour(s))  SARS Coronavirus 2 by RT PCR (hospital order, performed in [place] hospital lab) Nasopharyngeal Nasopharyngeal Swab     Status: None   Collection Time: 11/03/19 10:38 PM   Specimen: Nasopharyngeal Swab  Result Value Ref Range Status   SARS Coronavirus 2 NEGATIVE NEGATIVE Final    Comment: (NOTE) SARS-CoV-2 target nucleic acids are NOT DETECTED.  The SARS-CoV-2 RNA is generally detectable in upper and lower respiratory specimens during the acute phase of infection. The lowest concentration of SARS-CoV-2 viral copies this assay can detect is 250 copies / mL. A negative result does not preclude SARS-CoV-2 infection and should not be used as the sole basis for treatment or other patient  management decisions.  A negative result may occur with improper specimen collection / handling, submission of specimen other than nasopharyngeal swab, presence of viral mutation(s) within the areas targeted by this assay, and inadequate number of viral copies (<250 copies / mL). A negative result must be combined with clinical observations, patient history, and epidemiological information.  Fact Sheet for Patients:   StrictlyIdeas.no  Fact Sheet for Healthcare Providers: BankingDealers.co.za  This test is not yet approved or  cleared by the [place] FDA and has been authorized for detection and/or diagnosis of SARS-CoV-2 by FDA under an Emergency Use Authorization (EUA).  This EUA will remain in effect (meaning this test can be used) for the duration of the COVID-19 declaration under Section 564(b)(1) of the Act, 21 U.S.C. section 360bbb-3(b)(1), unless the authorization is terminated or revoked sooner.  Performed at [facility], [place] [address], [place], [place] [address]   MRSA PCR Screening     Status: None   Collection Time: 11/04/19  3:01 PM   Specimen: Nasopharyngeal Wash  Result Value Ref Range Status   MRSA by PCR NEGATIVE NEGATIVE Final    Comment:        The GeneXpert MRSA Assay (FDA approved for NASAL specimens only), is one component of a comprehensive MRSA colonization surveillance program. It is not intended to diagnose MRSA infection nor to guide or monitor treatment for MRSA infections. Performed at [facility], [place] [address], [place], [place] [address]      Time coordinating discharge: 35 minutes  SIGNED:   Aline August, MD  [facility] 11/10/2019, 11:07 AM

## 2019-11-10 NOTE — Progress Notes (Signed)
Patient ID: William Gibson, male   DOB: [birth year], 74 y.o.   MRN: [id number]  PROGRESS NOTE    William Gibson  William Gibson DOB: 1945/07/17 DOA: 11/03/2019 PCP: Patient, No Pcp Per   Brief Narrative:  74 year old male with history of alcohol abuse, alcoholic liver disease, tobacco use, rheumatoid arthritis and hearing loss presented with a fall with resulting subarachnoid hemorrhage.  He was seen by neurosurgery with no surgical intervention indicated.  Subsequently he developed worsening alcohol withdrawal with DTs.  Because of worsening DTs, PCCM was consulted and patient was started on Precedex drip.  He was transferred to ICU.  Subsequently, Precedex drip has been discontinued.  He was transferred back to [facility] service on 11/09/2019.  Assessment & Plan:   Alcohol abuse with alcohol withdrawal and DTs with acute toxic encephalopathy -Patient was started on CIWA protocol and Librium and as needed Ativan but because of worsening DTs, he was transferred to ICU and started on Precedex drip under ICU care.  -Subsequently, Precedex drip has been discontinued.  He was transferred back to [facility] service on 11/09/2019 -Mental status improving.  Currently on clonidine taper protocol.  Continue CIWA with folic acid, multivitamin and thiamine. -Fall precautions.  Monitor mental status.  Fall with subarachnoid hemorrhage and basilar skull fracture -No need for surgical intervention as per neurosurgery.  Repeat head CT without worsening bleed.  Neurosurgery following. -Fall precautions.  Mild hyponatremia -Monitor  Mildly elevated LFTs  -improving  History of RA -Followed by Dr. Gavin Pound.  On meloxicam alone as an outpatient.  Continue meloxicam.  Outpatient follow-up with PCP/rheumatology for DMARD needs  Frequent falls  -Probably from alcohol abuse/deconditioning as well as RA.  PT/OT recommends CIR.  CIR consulted.  Patient is medically stable for discharge to CIR. -Outpatient neurology  evaluation.    DVT prophylaxis: SCDs Code Status: Full Family Communication: Wife at bedside Disposition Plan: Status is: Inpatient  Remains inpatient appropriate because:Inpatient level of care appropriate due to severity of illness   Dispo: The patient is from: Home              Anticipated d/c is to: CIR              Anticipated d/c date is: 1 day              Patient currently is medically stable to d/c.  Consultants: Neurosurgery/PCCM  Procedures:  None  Antimicrobials: None   Subjective: Patient seen and examined at bedside.  Poor historian.  No fever, vomiting, worsening shortness of breath or seizures reported. Objective: Vitals:   11/09/19 1700 11/09/19 2004 11/10/19 0006 11/10/19 0434  BP: 104/64 (!) 147/83 123/86 (!) 141/84  Pulse: 61 (!) 57 60 62  Resp:  18 16 18   Temp:  97.6 F (36.4 C) (!) 97.5 F (36.4 C) 97.7 F (36.5 C)  TempSrc:   Oral Oral  SpO2:  98% 97% 98%  Weight:      Height:        Intake/Output Summary (Last 24 hours) at 11/10/2019 0712 Last data filed at 11/10/2019 0434 Gross per 24 hour  Intake 417.36 ml  Output 1225 ml  Net -807.64 ml   Filed Weights   11/03/19 2035  Weight: 74.8 kg    Examination:  General exam: No acute distress.  Looks chronically ill and deconditioned. Respiratory system: Bilateral decreased breath sounds at bases with some crackles.  No wheezing  cardiovascular system: S1 & S2 heard, currently rate controlled Gastrointestinal  system: Abdomen is nondistended, soft and nontender.  Bowel sounds are heard  extremities: No edema or clubbing  Data Reviewed: I have personally reviewed following labs and imaging studies  CBC: Recent Labs  Lab 11/03/19 2059 11/04/19 0225 11/05/19 0313 11/07/19 0337 11/10/19 0352  WBC 8.1 9.2 9.9 9.7 9.6  NEUTROABS  --   --  7.5  --  6.6  HGB 13.7 14.3 14.0 14.6 13.7  HCT 41.2 42.3 41.6 43.5 40.8  MCV 99.8 98.6 99.5 98.9 99.5  PLT 219 229 223 209 932   Basic  Metabolic Panel: Recent Labs  Lab 11/04/19 0225 11/04/19 0225 11/05/19 0313 11/05/19 0313 11/06/19 0406 11/07/19 0337 11/08/19 0352 11/09/19 0431 11/10/19 0352  NA 135   < > 132*   < > 133* 134* 134* 134* 134*  K 4.4   < > 4.5   < > 4.1 4.1 3.5 4.1 4.3  CL 103   < > 100   < > 101 102 100 102 103  CO2 20*   < > 24   < > 24 22 21* 24 23  GLUCOSE 129*   < > 141*   < > 117* 103* 123* 112* 102*  BUN 6*   < > 6*   < > 9 14 14 12 12   CREATININE 0.83   < > 0.73   < > 0.85 0.81 0.76 0.89 0.78  CALCIUM 8.8*   < > 8.9   < > 9.0 9.3 8.7* 8.5* 8.7*  MG 1.6*  --  2.0  --   --  1.7 1.8  --  1.8  PHOS 3.4  --   --   --   --  3.1 3.0  --   --    < > = values in this interval not displayed.   GFR: Estimated Creatinine Clearance: 83.6 mL/min (by C-G formula based on SCr of 0.78 mg/dL). Liver Function Tests: Recent Labs  Lab 11/06/19 0406 11/07/19 0337 11/08/19 0352 11/09/19 0431 11/10/19 0352  AST 31 42* 34 38 35  ALT 36 41 37 35 39  ALKPHOS 69 67 66 59 61  BILITOT 1.3* 1.2 1.0 0.7 0.7  PROT 7.0 7.3 7.2 6.5 6.6  ALBUMIN 3.0* 3.0* 2.9* 2.7* 2.7*   Recent Labs  Lab 11/03/19 2059  LIPASE 43   No results for input(s): AMMONIA in the last 168 hours. Coagulation Profile: No results for input(s): INR, PROTIME in the last 168 hours. Cardiac Enzymes: No results for input(s): CKTOTAL, CKMB, CKMBINDEX, TROPONINI in the last 168 hours. BNP (last 3 results) No results for input(s): PROBNP in the last 8760 hours. HbA1C: No results for input(s): HGBA1C in the last 72 hours. CBG: No results for input(s): GLUCAP in the last 168 hours. Lipid Profile: No results for input(s): CHOL, HDL, LDLCALC, TRIG, CHOLHDL, LDLDIRECT in the last 72 hours. Thyroid Function Tests: No results for input(s): TSH, T4TOTAL, FREET4, T3FREE, THYROIDAB in the last 72 hours. Anemia Panel: No results for input(s): VITAMINB12, FOLATE, FERRITIN, TIBC, IRON, RETICCTPCT in the last 72 hours. Sepsis Labs: Recent Labs    Lab 11/07/19 2030  PROCALCITON <0.10    Recent Results (from the past 240 hour(s))  SARS Coronavirus 2 by RT PCR (hospital order, performed in [facility] hospital lab) Nasopharyngeal Nasopharyngeal Swab     Status: None   Collection Time: 11/03/19 10:38 PM   Specimen: Nasopharyngeal Swab  Result Value Ref Range Status   SARS Coronavirus 2 NEGATIVE NEGATIVE Final  Comment: (NOTE) SARS-CoV-2 target nucleic acids are NOT DETECTED.  The SARS-CoV-2 RNA is generally detectable in upper and lower respiratory specimens during the acute phase of infection. The lowest concentration of SARS-CoV-2 viral copies this assay can detect is 250 copies / mL. A negative result does not preclude SARS-CoV-2 infection and should not be used as the sole basis for treatment or other patient management decisions.  A negative result may occur with improper specimen collection / handling, submission of specimen other than nasopharyngeal swab, presence of viral mutation(s) within the areas targeted by this assay, and inadequate number of viral copies (<250 copies / mL). A negative result must be combined with clinical observations, patient history, and epidemiological information.  Fact Sheet for Patients:   StrictlyIdeas.no  Fact Sheet for Healthcare Providers: BankingDealers.co.za  This test is not yet approved or  cleared by the [place] FDA and has been authorized for detection and/or diagnosis of SARS-CoV-2 by FDA under an Emergency Use Authorization (EUA).  This EUA will remain in effect (meaning this test can be used) for the duration of the COVID-19 declaration under Section 564(b)(1) of the Act, 21 U.S.C. section 360bbb-3(b)(1), unless the authorization is terminated or revoked sooner.  Performed at [facility], [place] [address], [place], [place] [address]   MRSA PCR Screening     Status: None   Collection Time: 11/04/19  3:01 PM    Specimen: Nasopharyngeal Wash  Result Value Ref Range Status   MRSA by PCR NEGATIVE NEGATIVE Final    Comment:        The GeneXpert MRSA Assay (FDA approved for NASAL specimens only), is one component of a comprehensive MRSA colonization surveillance program. It is not intended to diagnose MRSA infection nor to guide or monitor treatment for MRSA infections. Performed at [facility], [place] [address] [place], [place] [address]          Radiology Studies: No results found.      Scheduled Meds: . Chlorhexidine Gluconate Cloth  6 each Topical Daily  . cloNIDine  0.1 mg Oral BH-qamhs   Followed by  . [START ON 11/12/2019] cloNIDine  0.1 mg Oral QAC breakfast  . folic acid  1 mg Oral Daily   Or  . folic acid  1 mg Intravenous Daily  . mouth rinse  15 mL Mouth Rinse BID  . meloxicam  15 mg Oral Daily  . multivitamin with minerals  1 tablet Oral Daily  . nicotine  21 mg Transdermal Daily  . pantoprazole  40 mg Oral Daily  . sodium chloride flush  3 mL Intravenous Q12H  . thiamine  100 mg Oral Daily   Or  . thiamine  100 mg Intravenous Daily   Continuous Infusions:        Aline August, MD [facility] 11/10/2019, 7:12 AM

## 2019-11-10 NOTE — Plan of Care (Signed)
  Problem: RH SAFETY Goal: RH STG ADHERE TO SAFETY PRECAUTIONS W/ASSISTANCE/DEVICE Description: STG Adhere to Safety Precautions With Assistance/Device. Outcome: Progressing   Problem: RH PAIN MANAGEMENT Goal: RH STG PAIN MANAGED AT OR BELOW PT'S PAIN GOAL Outcome: Progressing

## 2019-11-10 NOTE — Progress Notes (Signed)
Kirsteins, Luanna Salk, MD  Physician  Physical Medicine and Rehabilitation  Consult Note     Signed  Date of Service:  11/08/2019 10:16 AM      Related encounter: ED to Hosp-Admission (Current) from 11/03/2019 in [place] [facility]      Signed      Expand All Collapse All  Show:Clear all [x] Manual[x] Template[] Copied  Added by: [x] Kirsteins, Luanna Salk, MD[x] Love, Ivan Anchors, PA-C  [] Hover for details          Physical Medicine and Rehabilitation Consult     Reason for Consult: TBI Referring Physician: Dr. Chase Caller     HPI: William Gibson is a 74 y.o. male with history of HTN, RA, fatty liver, ETOH abuse, shuffling gait flexed gait as well as increase in falls for about a week with last fall resulting with LOC ~ 10 minutes on 07/09 and reports of headache.  He was intoxicated at admission with ETOH level 190 and CT head done revealing bilateral mild to moderate SAH along bilateral tentorium and along right temporal and left frontal lobe as well as cute non-displaced fracture occipital region extending inferiorly to level of foramen magnum. Dr. Vertell Limber recommended conservative management and follow up CT head stable. He was admitted for observation and developed ETOH withdrawal with DT's requiring CIWA protocol as well as Precedex. PCCM following for support and patient's respiratory status has been stable and was  weaned off Precedex by 07/13. Therapy ongoing and patient continues to be limited by balance deficits with poor safety awareness, impulsivity and lethargy. CIR recommended due to functional decline.     Discussed with daughter, patient very active doing yard work but had multiple falls mainly when outside prior to admission.  He was found on the driveway prior to going to the hospital. The patient indicates that he has had some shoulder and finger pain and limitations related to his rheumatoid arthritis. He denies numbness or tingling in the hands or the feet.     Review of Systems  Musculoskeletal: Positive for falls (depends--worse in the past few weeks.  ).  Neurological: Positive for weakness.            Past Medical History:  Diagnosis Date  . Abnormal liver function tests 12/15/2018  . Alcoholic liver disease ([place]) 12/15/2018  . Alcoholism ([place])    . Elevated LDL cholesterol level 05/22/2013  . Epistaxis    . Erectile dysfunction 05/22/2013  . Frequent falls 11/09/2018  . GERD (gastroesophageal reflux disease)    . Hyperammonemia ([place]) 11/15/2018  . Memory change 08/17/2017  . RA (rheumatoid arthritis) ([place]) 08/17/2017  . RBBB 12/15/2018  . Rheumatoid arthritis ([place]) 2010  . Shuffling gait 12/19/2018  . Tobacco abuse 04/26/2012  . Weight loss, unintentional 11/09/2018  . 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 12/15/2018           Past Surgical History:  Procedure Laterality Date  . NASAL ENDOSCOPY WITH EPISTAXIS CONTROL N/A 04/12/2018    Procedure: NASAL ENDOSCOPY WITH EPISTAXIS CONTROL;  Surgeon: Melissa Montane, MD;  Location: [facility];  Service: ENT;  Laterality: N/A;  . VASECTOMY      . VASECTOMY REVERSAL               Family History  Problem Relation Age of Onset  . Lung cancer Father    . Heart disease Mother    . Colon cancer Neg Hx    . Esophageal cancer Neg Hx    .  Stomach cancer Neg Hx        Social History:  Married.  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 Wife has been working from home since [employer]. Per reports that he has been smoking cigarettes. He has a 87.00 pack-year smoking history. He has never used smokeless tobacco. He reports current alcohol use. He reports that he does not use drugs.      Allergies: No Known Allergies      Medications Prior to Admission  Medication Sig Dispense Refill  . Cyanocobalamin (VITAMIN B-12 PO) Take 1 tablet by mouth daily.      . meloxicam (MOBIC) 15 MG tablet Take 15 mg by mouth daily.   2  . omeprazole (PRILOSEC OTC) 20 MG  tablet Take 20 mg by mouth daily.      Marland Kitchen thiamine (VITAMIN B-1) 100 MG tablet Take 1 tablet (100 mg total) by mouth daily. 90 tablet 3  . chlordiazePOXIDE (LIBRIUM) 25 MG capsule 50mg  PO TID x 1D, then 25-50mg  PO BID X 1D, then 25-50mg  PO QD X 1D (Patient not taking: Reported on 11/03/2019) 15 capsule 0  . Cobalamin Combinations (VITAMIN B12-FOLIC ACID) 811-914 MCG TABS 1 tab by mouth daily. (Patient not taking: Reported on 11/03/2019) 90 tablet 3      Home: [place] expects to be discharged to:: Private residence Living Arrangements: Spouse/significant other Available Help at Discharge: Family Type of Home: House Home Access: Stairs to enter Technical brewer of Steps: 3 Entrance Stairs-Rails: Right Home Layout: One level Bathroom Shower/Tub: [place]: Environmental consultant - 2 wheels, Other (comment), Shower seat Additional Comments: walking stick; states has a chair he can put into the shower  Functional History: Prior Function Level of Independence: Independent with assistive device(s) Comments: reports multiple falls at home, uses walking stick outside, enjoys yardwork, Daughter present today reports her mom can't get him up when he falls, pt reports he gets up on his own Functional Status:  Mobility: Bed Mobility Overal bed mobility: Needs Assistance Bed Mobility: Supine to Sit Supine to sit: Min guard, HOB elevated General bed mobility comments: cues to slow down and assist for safety, lines Transfers Overall transfer level: Needs assistance Equipment used: Rolling walker (2 wheeled) Transfers: Sit to/from Stand Sit to Stand: Min assist Stand pivot transfers: Mod assist General transfer comment: up wtih assist for safety and lines Ambulation/Gait Ambulation/Gait assistance: Mod assist, +2 safety/equipment, Min assist Gait Distance (Feet): 150 Feet (&20') Assistive device: Rolling walker (2 wheeled) Gait Pattern/deviations: Step-through pattern,  Decreased stride length, Shuffle, Trunk flexed General Gait Details: to bathroom with min A for balance/walker management, then in the hallway needing mod facilitation for hip/trunk extension and cues throughout for forward gaze and assist for negotiating obstacles in hallway.   ADL: ADL Overall ADL's : Needs assistance/impaired Eating/Feeding: Moderate assistance, Sitting Grooming: Wash/dry hands, Wash/dry face, Oral care, Brushing hair, Total assistance, Standing Grooming Details (indicate cue type and reason): Pt unable to sustain attention to engage  Upper Body Bathing: Total assistance, Sitting Lower Body Bathing: Total assistance, Sit to/from stand Upper Body Dressing : Total assistance, Sitting Lower Body Dressing: Total assistance, Sit to/from stand Toilet Transfer: Ambulation, Comfort height toilet, +2 for safety/equipment, Moderate assistance, Regular Toilet, BSC, Grab bars, RW Toileting- Clothing Manipulation and Hygiene: Total assistance, Sit to/from stand Functional mobility during ADLs: Moderate assistance, +2 for safety/equipment, Cueing for safety, Cueing for sequencing, Rolling walker General ADL Comments: Pt unable to sustain attention to  complete in or engage in ADL tasks    Cognition: Cognition Overall Cognitive Status: Impaired/Different from baseline Arousal/Alertness: Lethargic Orientation Level: Oriented to person, Oriented to time, Oriented to situation, Disoriented to time Attention: Sustained Sustained Attention: Impaired Sustained Attention Impairment: Verbal basic Awareness: Impaired Awareness Impairment: Intellectual impairment, Emergent impairment, Anticipatory impairment Problem Solving: Impaired Problem Solving Impairment: Functional basic Behaviors: Impulsive Safety/Judgment: Impaired [place] [employer] Scales of Cognitive Functioning: Confused/inappropriate/non-agitated Cognition Arousal/Alertness: Awake/alert Behavior During Therapy: Flat  affect Overall Cognitive Status: Impaired/Different from baseline Area of Impairment: Orientation, Attention, Memory, Following commands, Safety/judgement, Awareness, Problem solving, [place] level Orientation Level: Disoriented to, Time Current Attention Level: Sustained Memory: Decreased short-term memory Following Commands: Follows one step commands consistently, Follows one step commands with increased time Safety/Judgement: Decreased awareness of safety, Decreased awareness of deficits Problem Solving: Slow processing, Requires verbal cues General Comments: slow processing, needed redirection getting out of bed on L when cued to get out on R and trying to enter another room in hallway thinking it was his     Blood pressure (!) 161/88, pulse 69, temperature 98.5 F (36.9 C), temperature source Axillary, resp. rate 18, height 5\' 10"  (1.778 m), weight 74.8 kg, SpO2 96 %. Physical Exam Vitals and nursing note reviewed.  Constitutional:      General: He is not in acute distress.    Comments: Frail male was up walking with PT. Flexed posture with flexed knees, tremulous and shuffling gait.  Impulsive.  HENT:     Head: Normocephalic.  Eyes:     General: No scleral icterus.       Right eye: No discharge.        Left eye: No discharge.     Conjunctiva/sclera: Conjunctivae normal.     Pupils: Pupils are equal, round, and reactive to light.     Comments: Mild ptosis right eye, no evidence of disconjugate gaze  Neck:     Comments: Reduced range of motion in cervical spine although may be secondary to lethargy and poor compliance with examination Cardiovascular:     Rate and Rhythm: Regular rhythm. Tachycardia present.     Heart sounds: Normal heart sounds. No murmur heard.   Pulmonary:     Effort: Pulmonary effort is normal. No respiratory distress.     Breath sounds: Normal breath sounds. No stridor. No wheezing or rhonchi.  Abdominal:     General: Abdomen is flat. Bowel sounds are  normal. There is distension.     Palpations: Abdomen is soft.     Tenderness: There is no abdominal tenderness.  Musculoskeletal:        General: Deformity present. No tenderness.     Right lower leg: No edema.     Left lower leg: No edema.     Comments: Patient has Heberden's nodules PIP and DIP both hands, also MCP swelling there is crepitus at the right greater than left shoulder with passive range of motion.  There is mildly diminished bilateral shoulder range of motion flexion and abduction.  Skin:    General: Skin is warm and dry.  Neurological:     Mental Status: He is lethargic and confused.     Cranial Nerves: Dysarthria present. No facial asymmetry.     Sensory: Sensation is intact.     Motor: Tremor present.     Coordination: Finger-Nose-Finger Test abnormal.     Gait: Gait abnormal.     Comments: Patient oriented to person and place but not time Motor strength is 4/5  bilateral deltoid bicep tricep grip hip flexor knee extensor ankle dorsiflexor Sensation intact to light touch bilateral fingers and toes  Psychiatric:        Attention and Perception: He is inattentive.        Mood and Affect: Affect is flat.        Speech: Speech is slurred.        Behavior: Behavior is slowed and withdrawn.        Cognition and Memory: Cognition is impaired. Memory is impaired.      Lab Results Last 24 Hours       Results for orders placed or performed during the hospital encounter of 11/03/19 (from the past 24 hour(s))  Procalcitonin - Baseline     Status: None    Collection Time: 11/07/19  8:30 PM  Result Value Ref Range    Procalcitonin <0.10 ng/mL  Comprehensive metabolic panel     Status: Abnormal    Collection Time: 11/08/19  3:52 AM  Result Value Ref Range    Sodium 134 (L) 135 - 145 mmol/L    Potassium 3.5 3.5 - 5.1 mmol/L    Chloride 100 98 - 111 mmol/L    CO2 21 (L) 22 - 32 mmol/L    Glucose, Bld 123 (H) 70 - 99 mg/dL    BUN 14 8 - 23 mg/dL    Creatinine, Ser 0.76 0.61  - 1.24 mg/dL    Calcium 8.7 (L) 8.9 - 10.3 mg/dL    Total Protein 7.2 6.5 - 8.1 g/dL    Albumin 2.9 (L) 3.5 - 5.0 g/dL    AST 34 15 - 41 U/L    ALT 37 0 - 44 U/L    Alkaline Phosphatase 66 38 - 126 U/L    Total Bilirubin 1.0 0.3 - 1.2 mg/dL    GFR calc non Af Amer >60 >60 mL/min    GFR calc Af Amer >60 >60 mL/min    Anion gap 13 5 - 15  Phosphorus     Status: None    Collection Time: 11/08/19  3:52 AM  Result Value Ref Range    Phosphorus 3.0 2.5 - 4.6 mg/dL  Magnesium     Status: None    Collection Time: 11/08/19  3:52 AM  Result Value Ref Range    Magnesium 1.8 1.7 - 2.4 mg/dL       Imaging Results (Last 48 hours)  DG CHEST PORT 1 VIEW   Result Date: 11/07/2019 CLINICAL DATA:  Tachypneic EXAM: PORTABLE CHEST 1 VIEW COMPARISON:  11/03/2019 FINDINGS: Single frontal view of the chest demonstrates an unremarkable cardiac silhouette. No airspace disease, effusion, or pneumothorax. No acute bony abnormalities. IMPRESSION: 1. Stable exam, no acute process. Electronically Signed   By: Randa Ngo M.D.   On: 11/07/2019 19:54         Assessment/Plan: Diagnosis: SAH after fall with balance and cognitive deficits 1. Does the need for close, 24 hr/day medical supervision in concert with the patient's rehab needs make it unreasonable for this patient to be served in a less intensive setting? Yes 2. Co-Morbidities requiring supervision/potential complications: Alcoholism, RA 3. Due to bladder management, bowel management, safety, skin/wound care, disease management, medication administration, pain management and patient education, does the patient require 24 hr/day rehab nursing? Yes 4. Does the patient require coordinated care of a physician, rehab nurse, therapy disciplines of PT, OT< SLP to address physical and functional deficits in the context of the above medical diagnosis(es)? Yes Addressing  deficits in the following areas: balance, endurance, locomotion, strength, transferring,  bowel/bladder control, bathing, dressing, feeding, grooming, toileting, cognition, speech, swallowing and psychosocial support 5. Can the patient actively participate in an intensive therapy program of at least 3 hrs of therapy per day at least 5 days per week? Yes 6. The potential for patient to make measurable gains while on inpatient rehab is good 7. Anticipated functional outcomes upon discharge from inpatient rehab are supervision  with PT, supervision with OT, supervision with SLP. 8. Estimated rehab length of stay to reach the above functional goals is: 14-16d 9. Anticipated discharge destination: Home 10. Overall Rehab/Functional Prognosis: good   RECOMMENDATIONS: This patient's condition is appropriate for continued rehabilitative care in the following setting: CIR Patient has agreed to participate in recommended program. N/A Note that insurance prior authorization may be required for reimbursement for recommended care.   Comment: daughter at bedside in agreement with plan for in hospital rehab     Bary Leriche, PA-C 11/08/2019  "I have personally performed a face to face diagnostic evaluation of this patient.  Additionally, I have reviewed and concur with the physician assistant's documentation above." Charlett Blake M.D. [facility] FAAPM&R (Neuromuscular Med) Diplomate Am Board of Electrodiagnostic Med Fellow Am Board of Interventional Pain            Revision History                     Routing History                     Note Details  Author Charlett Blake, MD File Time 11/08/2019 11:45 AM  Author Type Physician Status Signed  Last Editor Charlett Blake, MD Service Physical Medicine and Rehabilitation

## 2019-11-10 NOTE — Progress Notes (Signed)
Patient admitted to room. Oriented to plan of care, use of call light,  instructed to call for assistance to get oob. Bed in low locked position, call light in reach, bed alarm activated. Wife at bedside attentive to needs.

## 2019-11-10 NOTE — Progress Notes (Signed)
Inpatient Rehabilitation Medication Review by a Pharmacist  A complete drug regimen review was completed for this patient to identify any potential clinically significant medication issues.  Clinically significant medication issues were identified:  yes   Type of Medication Issue Identified Description of Issue Urgent (address now) Non-Urgent (address on AM team rounds) Plan Plan Accepted by Provider? (Yes / No / Pending AM Rounds)  Drug Interaction(s) (clinically significant)       Duplicate Therapy       Allergy       No Medication Administration End Date       Incorrect Dose       Additional Drug Therapy Needed   Add thiamine and folic acid for h/o EtOH w DTs   Non urgent  Contact MD to add missing supplements   Other         Pharmacist comments: missing medication therapy, non urgent. Communication will made to provider on rounds tomorrow.  Time spent performing this drug regimen review (minutes):  15 mins   Brendolyn Patty, PharmD Clinical Pharmacist  11/10/2019   4:35 PM   Please check AMION for all [place] phone numbers After 10:00 PM, call the [place] [phone]

## 2019-11-10 NOTE — PMR Pre-admission (Addendum)
PMR Admission Coordinator Pre-Admission Assessment  Patient: William Gibson is an 74 y.o., male MRN: [id number] DOB: 1945-08-09 Height: 5\' 10"  (177.8 cm) Weight: 74.8 kg              Insurance Information HMO:     PPO: yes     PCP:      IPA:      80/20:      OTHER:  PRIMARY: [employer] commercial      Policy#: [id number]      Subscriber: wife CM Name: Gildardo Cranker      Phone#: [phone]     Fax#: [phone] Pre-Cert#: [id number] for 7 days with F/u Celene Skeen phone [phone] ext [phone] fax [phone]      Employer: [employer] Benefits:  Phone #: [phone]     Name: 7/15 Eff. Date: 04/28/2019     Deduct: $500      Out of Pocket Max: $2000      Life Max: none  CIR: 80%      SNF: 80% 100 days Outpatient: 80%     Co-Pay: 205 Home Health: 80%      Co-Pay: 20% DME: 80%     Co-Pay: 205 Providers: in network  SECONDARY: Medicare a and b      Policy#: [id number]     Active 04/27/2010  Financial Counselor:       Phone#:   The "Data Collection Information Summary" for patients in Inpatient Rehabilitation Facilities with attached "Privacy Act [place] Records" was provided and verbally reviewed with: Patient and Family  Emergency Contact Information Contact Information    Name Relation Home Work Mobile   [place] Spouse   [phone]   [place] Mane Daughter   [phone]     Current Medical History  Patient Admitting Diagnosis: SAH after fall  History of Present Illness:74 y.o. male with history of HTN, RA, fatty liver, ETOH abuse, shuffling gait flexed gait as well as increase in falls for about a week with last fall resulting with LOC ~ 10 minutes on 07/09 and reports of headache.  He was intoxicated at admission with ETOH level 190 and CT head done revealing bilateral mild to moderate SAH along bilateral tentorium and along right temporal and left frontal lobe as well as cute non-displaced fracture occipital region extending inferiorly to level of  foramen magnum. Dr. Vertell Limber recommended conservative management and follow up CT head stable. He was admitted for observation and developed ETOH withdrawal with DT's requiring CIWA protocol as well as Precedex. PCCM following for support and patient's respiratory status has been stable and was  weaned off Precedex by 07/13. Therapy ongoing and patient continues to be limited by balance deficits with poor safety awareness, impulsivity and lethargy.     Discussed with daughter, patient very active doing yard work but had multiple falls mainly when outside prior to admission.  He was found on the driveway prior to going to the hospital. The patient indicates that he has had some shoulder and finger pain and limitations related to his rheumatoid arthritis. He denies numbness or tingling in the hands or the feet.   Past Medical History  Past Medical History:  Diagnosis Date  . Abnormal liver function tests 12/15/2018  . Alcoholic liver disease ([place]) 12/15/2018  . Alcoholism ([place])   . Elevated LDL cholesterol level 05/22/2013  . Epistaxis   . Erectile dysfunction 05/22/2013  . Frequent falls 11/09/2018  . GERD (gastroesophageal reflux disease)   . Hyperammonemia (  [place]) 11/15/2018  . Memory change 08/17/2017  . RA (rheumatoid arthritis) ([place]) 08/17/2017  . RBBB 12/15/2018  . Rheumatoid arthritis ([place]) 2010  . Shuffling gait 12/19/2018  . Tobacco abuse 04/26/2012  . Weight loss, unintentional 11/09/2018  . 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 12/15/2018    Family History  family history includes Heart disease in his mother; Lung cancer in his father.  Prior Rehab/Hospitalizations:  Has the patient had prior rehab or hospitalizations prior to admission? Yes  Has the patient had major surgery during 100 days prior to admission? No  Current Medications   Current Facility-Administered Medications:  .  Chlorhexidine Gluconate Cloth 2 % PADS 6 each, 6 each, Topical, Daily, Alma Friendly,  MD, 6 each at 11/09/19 [phone] .  [COMPLETED] cloNIDine (CATAPRES) tablet 0.1 mg, 0.1 mg, Oral, QID, 0.1 mg at 11/09/19 2155 **FOLLOWED BY** cloNIDine (CATAPRES) tablet 0.1 mg, 0.1 mg, Oral, BH-qamhs **FOLLOWED BY** [START ON 11/12/2019] cloNIDine (CATAPRES) tablet 0.1 mg, 0.1 mg, Oral, QAC breakfast, Ramaswamy, Murali, MD .  dicyclomine (BENTYL) tablet 20 mg, 20 mg, Oral, Q6H PRN, Brand Males, MD .  folic acid (FOLVITE) tablet 1 mg, 1 mg, Oral, Daily, 1 mg at 40/10/27 2536 **OR** folic acid injection 1 mg, 1 mg, Intravenous, Daily, Brand Males, MD, 1 mg at 11/10/19 0943 .  hydrALAZINE (APRESOLINE) tablet 25 mg, 25 mg, Oral, Q8H PRN, Alma Friendly, MD, 25 mg at 11/05/19 0948 .  hydrOXYzine (ATARAX/VISTARIL) tablet 25 mg, 25 mg, Oral, Q6H PRN, Brand Males, MD .  loperamide (IMODIUM) capsule 2-4 mg, 2-4 mg, Oral, PRN, Brand Males, MD .  MEDLINE mouth rinse, 15 mL, Mouth Rinse, BID, Chase Caller, Murali, MD, 15 mL at 11/10/19 0944 .  meloxicam (MOBIC) tablet 15 mg, 15 mg, Oral, Daily, Alekh, Kshitiz, MD, 15 mg at 11/10/19 0944 .  menthol-cetylpyridinium (CEPACOL) lozenge 3 mg, 1 lozenge, Oral, PRN, Starla Link, Kshitiz, MD, 3 mg at 11/09/19 1421 .  methocarbamol (ROBAXIN) tablet 500 mg, 500 mg, Oral, Q8H PRN, Brand Males, MD .  multivitamin with minerals tablet 1 tablet, 1 tablet, Oral, Daily, Chotiner, Yevonne Aline, MD, 1 tablet at 11/10/19 0946 .  naproxen (NAPROSYN) tablet 500 mg, 500 mg, Oral, BID PRN, Brand Males, MD, 500 mg at 11/09/19 1008 .  nicotine (NICODERM CQ - dosed in mg/24 hours) patch 21 mg, 21 mg, Transdermal, Daily, Alma Friendly, MD, 21 mg at 11/10/19 0945 .  ondansetron (ZOFRAN-ODT) disintegrating tablet 4 mg, 4 mg, Oral, Q6H PRN, Chase Caller, Murali, MD .  oxyCODONE (Oxy IR/ROXICODONE) immediate release tablet 5 mg, 5 mg, Oral, Q8H PRN, Alma Friendly, MD, 5 mg at 11/08/19 0603 .  pantoprazole (PROTONIX) EC tablet 40 mg, 40 mg, Oral, Daily,  Alma Friendly, MD, 40 mg at 11/10/19 0943 .  Resource ThickenUp Clear, , Oral, PRN, Brand Males, MD .  sodium chloride flush (NS) 0.9 % injection 3 mL, 3 mL, Intravenous, Q12H, Erline Levine, MD, 3 mL at 11/09/19 2156 .  thiamine tablet 100 mg, 100 mg, Oral, Daily, 100 mg at 11/07/19 0950 **OR** thiamine (B-1) injection 100 mg, 100 mg, Intravenous, Daily, Chotiner, Yevonne Aline, MD, 100 mg at 11/10/19 6440  Patients Current Diet:  Diet Order            DIET DYS 2 Room service appropriate? Yes with Assist; Fluid consistency: Nectar Thick  Diet effective now                 Precautions /  Restrictions Precautions Precautions: Fall Restrictions Weight Bearing Restrictions: No   Has the patient had 2 or more falls or a fall with injury in the past year?Yes  Prior Activity Level Community (5-7x/wk): Independent. Active in yard work but with multiple falls. Uses Walking stick in the yard.  Prior Functional Level Prior Function Level of Independence: Independent with assistive device(s) Comments: reports multiple falls at home, uses walking stick outside, enjoys yardwork, Daughter present today reports her mom can't get him up when he falls, pt reports he gets up on his own  Self Care: Did the patient need help bathing, dressing, using the toilet or eating?  Independent  Indoor Mobility: Did the patient need assistance with walking from room to room (with or without device)? Independent  Stairs: Did the patient need assistance with internal or external stairs (with or without device)? Independent  Functional Cognition: Did the patient need help planning regular tasks such as shopping or remembering to take medications? [place] / Equipment Home Assistive Devices/Equipment: Other (Comment) (walking stick) Home Equipment: Walker - 2 wheels, Other (comment), Shower seat  Prior Device Use: Indicate devices/aids used by the patient prior to current  illness, exacerbation or injury? walking stick used in the yard  Current Functional Level Cognition  Arousal/Alertness: Lethargic Overall Cognitive Status: Impaired/Different from baseline Current Attention Level: Selective Orientation Level: Oriented X4 Following Commands: Follows one step commands consistently, Follows multi-step commands with increased time Safety/Judgement: Decreased awareness of safety, Decreased awareness of deficits General Comments: oriented to place, situation this pm and more alert and interactive Attention: Sustained Sustained Attention: Impaired Sustained Attention Impairment: Verbal basic Awareness: Impaired Awareness Impairment: Intellectual impairment, Emergent impairment, Anticipatory impairment Problem Solving: Impaired Problem Solving Impairment: Functional basic Behaviors: Impulsive Safety/Judgment: Impaired Rancho [place] Amigos Scales of Cognitive Functioning: Confused/appropriate    Extremity Assessment (includes Sensation/Coordination)  Upper Extremity Assessment: Overall WFL for tasks assessed  Lower Extremity Assessment: Defer to PT evaluation    ADLs  Overall ADL's : Needs assistance/impaired Eating/Feeding: Minimal assistance, Sitting Eating/Feeding Details (indicate cue type and reason): assist to control portions and for swallowing precautions  Grooming: Wash/dry hands, Wash/dry face, Oral care, Moderate assistance, Standing Grooming Details (indicate cue type and reason): requires assist for thoroughness, and for balance  Upper Body Bathing: Moderate assistance, Sitting Lower Body Bathing: Moderate assistance, Sit to/from stand Upper Body Dressing : Moderate assistance, Sitting Lower Body Dressing: Maximal assistance, Sit to/from stand Toilet Transfer: Moderate assistance, +2 for safety/equipment, Ambulation, Comfort height toilet, Grab bars Toileting- Clothing Manipulation and Hygiene: Total assistance, Sit to/from stand Functional  mobility during ADLs: Moderate assistance, +2 for physical assistance, +2 for safety/equipment General ADL Comments: Pt unable to sustain attention to complete in or engage in ADL tasks     Mobility  Overal bed mobility: Needs Assistance Bed Mobility: Sit to Supine, Supine to Sit Supine to sit: Min guard, HOB elevated Sit to supine: Supervision General bed mobility comments: min guard for safety/lines    Transfers  Overall transfer level: Needs assistance Equipment used: Rolling walker (2 wheeled) Transfers: Sit to/from Stand Sit to Stand: Min guard Stand pivot transfers: Mod assist General transfer comment: assist for balance/lines    Ambulation / Gait / Stairs / Wheelchair Mobility  Ambulation/Gait Ambulation/Gait assistance: Min assist, Mod assist Gait Distance (Feet): 150 Feet Assistive device: Rolling walker (2 wheeled) Gait Pattern/deviations: Step-through pattern, Decreased stride length, Shuffle, Trunk flexed, Decreased step length - right General Gait Details: cues but less facilitation for  hip extension and head upright, still suffling, more with R, but only had to stop to reset balance and walker proximity when back in room    Posture / Balance Dynamic Sitting Balance Sitting balance - Comments: reaching for toilet hygiene without LOB Balance Overall balance assessment: Needs assistance Sitting-balance support: Feet supported Sitting balance-Leahy Scale: Good Sitting balance - Comments: reaching for toilet hygiene without LOB Standing balance support: Bilateral upper extremity supported Standing balance-Leahy Scale: Poor Standing balance comment: at bedside standing with minguard A no UE support initially High Level Balance Comments: side stepping at bedside with UE support, cues for stepping higher on R and mod A to prevent lateral LOB and cues for upright posture    Special needs/care consideration  Designated visitors are wife, [name] and daughter, Odetta Pink  protocol on acute   Previous Home Environment  Living Arrangements: Spouse/significant other  Lives With: Spouse Available Help at Discharge: Family, Available 24 hours/day Type of Home: House Home Layout: One level Home Access: Stairs to enter Entrance Stairs-Rails: Right Entrance Stairs-Number of Steps: 3 Bathroom Shower/Tub: Multimedia programmer: Standard Bathroom Accessibility: Yes How Accessible: Accessible via walker Home Care Services: No Additional Comments: walking stick; states has a chair he can put into the shower  Discharge Living Setting Plans for Discharge Living Setting: Patient's home, Lives with (comment) (spouse) Type of Home at Discharge: House Discharge Home Layout: One level Discharge Home Access: Stairs to enter Entrance Stairs-Rails: Right Entrance Stairs-Number of Steps: 3 Discharge Bathroom Shower/Tub: Walk-in shower Discharge Bathroom Toilet: Standard Discharge Bathroom Accessibility: Yes How Accessible: Accessible via walker Does the patient have any problems obtaining your medications?: No  Social/Family/Support Systems Patient Roles: Spouse, Parent Contact Information: wife, [name] and daughter, [name] Anticipated Caregiver: wife and daughters Anticipated Caregiver's Contact Information: see above Ability/Limitations of Caregiver: wife works but will make arrangements Caregiver Availability: 24/7 Discharge Plan Discussed with Primary Caregiver: Yes Is Caregiver In Agreement with Plan?: Yes Does Caregiver/Family have Issues with Lodging/Transportation while Pt is in Rehab?: No  Goals Patient/Family Goal for Rehab: supervision PT, OT, and SLP Expected length of stay: ELOS 10 to 14 days Pt/Family Agrees to Admission and willing to participate: Yes Program Orientation Provided & Reviewed with Pt/Caregiver Including Roles  & Responsibilities: Yes  Decrease burden of Care through IP rehab admission: n/a  Possible need for SNF  placement upon discharge:not anticipated  Patient Condition: This patient's condition remains as documented in the consult dated 11/09/2019, in which the Rehabilitation Physician determined and documented that the patient's condition is appropriate for intensive rehabilitative care in an inpatient rehabilitation facility. Will admit to inpatient rehab today.  Preadmission Screen Completed By:  Cleatrice Burke, RN, 11/10/2019 11:09 AM ______________________________________________________________________   Discussed status with Dr. Posey Pronto on 11/10/2019 at  1122 and received approval for admission today.  Admission Coordinator:  Cleatrice Burke, time 6269 Date 11/10/2019

## 2019-11-10 NOTE — H&P (Signed)
Physical Medicine and Rehabilitation Admission H&P    Chief Complaint  Patient presents with  . TBI    HPI: William Gibson" William Gibson is a 74 year old male with history of RA,HTN, fatty liver due to ETOH abuse, gait disorder with shuffling flexed posture and increased in falls X a week who was admitted on 11/03/2019 after a fall in his driveway, where he struck his head.  History taken from chart review and daughter due to cognition.  Approximately 10 minutes LOC.  Thereafter, he reported headaches.  He was intoxicated on admission with a EtOH level of 190.  Imaging revealed bilateral mild to moderate SAH along bilateral tentorium and along right temporal and left frontal lobe as well as acute nondisplaced fracture of occipital region extending inferiorly to level of foramen magnum.  Dr. Vertell Limber was consulted for input and recommended conservative management.  Follow-up CT head was stable.  He was admitted for observation and developed EtOH withdrawal with DTs requiring CIWA protocol as well as Precedex.    PCCM has been following for support and patient's respiratory status has been stable. He was weaned off Precedex, tapered off Librium and required ativan due to hallucinations. He did develop somnolence post ativan dose. He was  placed on clonidine taper to complete detox due to ongoing issues with hallucinations and confusion.  His lethargy has resolved with improvement in mental status.  He continues to have issues with balance deficits with flexed posture and shuffling gait as well as decrease in right foot clearance, cognitive deficits with impairments in attention, moderate dysarthria with dysphagia.  Hospital course further complicated by dysphagia.  MBS done today and diet modified to dysphagia 1, thin liquids.  Therapy has been ongoing and CIR was recommended due to functional decline.  Please see preadmission assessment from earlier today as well.  Review of Systems  Constitutional: Negative  for chills and fever.  HENT: Positive for hearing loss. Negative for tinnitus.   Eyes: Negative for blurred vision and double vision.  Respiratory: Positive for cough. Negative for shortness of breath and stridor.   Cardiovascular: Negative for chest pain, palpitations and leg swelling.  Gastrointestinal: Negative for diarrhea and heartburn.  Musculoskeletal: Positive for joint pain (all over due to RA) and myalgias.  Skin: Negative for rash.  Neurological: Positive for speech change and headaches. Negative for focal weakness.  Psychiatric/Behavioral: The patient is not nervous/anxious and does not have insomnia.   All other systems reviewed and are negative.  Past Medical History:  Diagnosis Date  . Abnormal liver function tests 12/15/2018  . Alcoholic liver disease ([place]) 12/15/2018  . Alcoholism ([place])   . Elevated LDL cholesterol level 05/22/2013  . Epistaxis   . Erectile dysfunction 05/22/2013  . Frequent falls 11/09/2018  . GERD (gastroesophageal reflux disease)   . Hyperammonemia ([place]) 11/15/2018  . Memory change 08/17/2017  . RA (rheumatoid arthritis) ([place]) 08/17/2017  . RBBB 12/15/2018  . Rheumatoid arthritis ([place]) 2010  . Shuffling gait 12/19/2018  . Tobacco abuse 04/26/2012  . Weight loss, unintentional 11/09/2018  . 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 12/15/2018    Past Surgical History:  Procedure Laterality Date  . NASAL ENDOSCOPY WITH EPISTAXIS CONTROL N/A 04/12/2018   Procedure: NASAL ENDOSCOPY WITH EPISTAXIS CONTROL;  Surgeon: Melissa Montane, MD;  Location: [facility];  Service: ENT;  Laterality: N/A;  . VASECTOMY    . VASECTOMY REVERSAL      Family History  Problem Relation Age of Onset  .  Lung cancer Father   . Heart disease Mother   . Colon cancer Neg Hx   . Esophageal cancer Neg Hx   . Stomach cancer Neg Hx     Social History:  Married. 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 Wife is 20 yrs old?--still works. He reports that he has  been smoking cigarettes. He has a 87.00 pack-year smoking history. He has never used smokeless tobacco. Per reports drinks >1/5 of whiskey per day.  He reports that he does not use drugs.    Allergies: No Known Allergies    Medications Prior to Admission  Medication Sig Dispense Refill  . Cyanocobalamin (VITAMIN B-12 PO) Take 1 tablet by mouth daily.    . meloxicam (MOBIC) 15 MG tablet Take 15 mg by mouth daily.  2  . omeprazole (PRILOSEC OTC) 20 MG tablet Take 20 mg by mouth daily.    Marland Kitchen thiamine (VITAMIN B-1) 100 MG tablet Take 1 tablet (100 mg total) by mouth daily. 90 tablet 3  . chlordiazePOXIDE (LIBRIUM) 25 MG capsule 50mg  PO TID x 1D, then 25-50mg  PO BID X 1D, then 25-50mg  PO QD X 1D (Patient not taking: Reported on 11/03/2019) 15 capsule 0  . Cobalamin Combinations (VITAMIN B12-FOLIC ACID) 408-144 MCG TABS 1 tab by mouth daily. (Patient not taking: Reported on 11/03/2019) 90 tablet 3    Drug Regimen Review  Drug regimen was reviewed and remains appropriate with no significant issues identified  Home: Home Living Family/patient expects to be discharged to:: Unsure Living Arrangements: Spouse/significant other Available Help at Discharge: Family Type of Home: House Home Access: Stairs to enter Technical brewer of Steps: 3 Entrance Stairs-Rails: Right Home Layout: One level Bathroom Shower/Tub: Walk-in shower Home Equipment: Environmental consultant - 2 wheels, Other (comment), Shower seat Additional Comments: walking stick; states has a chair he can put into the shower   Functional History: Prior Function Level of Independence: Independent with assistive device(s) Comments: reports multiple falls at home, uses walking stick outside, enjoys yardwork, Daughter present today reports her mom can't get him up when he falls, pt reports he gets up on his own  Functional Status:  Mobility: Bed Mobility Overal bed mobility: Needs Assistance Bed Mobility: Sit to Supine, Supine to Sit Supine to  sit: Min guard, HOB elevated Sit to supine: Supervision General bed mobility comments: min guard for safety/lines Transfers Overall transfer level: Needs assistance Equipment used: Rolling walker (2 wheeled) Transfers: Sit to/from Stand Sit to Stand: Min guard Stand pivot transfers: Mod assist General transfer comment: assist for balance/lines Ambulation/Gait Ambulation/Gait assistance: Min assist, Mod assist Gait Distance (Feet): 150 Feet Assistive device: Rolling walker (2 wheeled) Gait Pattern/deviations: Step-through pattern, Decreased stride length, Shuffle, Trunk flexed, Decreased step length - right General Gait Details: cues but less facilitation for hip extension and head upright, still suffling, more with R, but only had to stop to reset balance and walker proximity when back in room    ADL: ADL Overall ADL's : Needs assistance/impaired Eating/Feeding: Minimal assistance, Sitting Eating/Feeding Details (indicate cue type and reason): assist to control portions and for swallowing precautions  Grooming: Wash/dry hands, Wash/dry face, Oral care, Moderate assistance, Standing Grooming Details (indicate cue type and reason): requires assist for thoroughness, and for balance  Upper Body Bathing: Moderate assistance, Sitting Lower Body Bathing: Moderate assistance, Sit to/from stand Upper Body Dressing : Moderate assistance, Sitting Lower Body Dressing: Maximal assistance, Sit to/from stand Toilet Transfer: Moderate assistance, +2 for safety/equipment, Ambulation, Comfort height  toilet, Grab bars Toileting- Clothing Manipulation and Hygiene: Total assistance, Sit to/from stand Functional mobility during ADLs: Moderate assistance, +2 for physical assistance, +2 for safety/equipment General ADL Comments: Pt unable to sustain attention to complete in or engage in ADL tasks   Cognition: Cognition Overall Cognitive Status: Impaired/Different from baseline Arousal/Alertness:  Lethargic Orientation Level: Oriented X4 Attention: Sustained Sustained Attention: Impaired Sustained Attention Impairment: Verbal basic Awareness: Impaired Awareness Impairment: Intellectual impairment, Emergent impairment, Anticipatory impairment Problem Solving: Impaired Problem Solving Impairment: Functional basic Behaviors: Impulsive Safety/Judgment: Impaired [employer] Scales of Cognitive Functioning: Confused/appropriate Cognition Arousal/Alertness: Awake/alert Behavior During Therapy: Flat affect Overall Cognitive Status: Impaired/Different from baseline Area of Impairment: Attention, Following commands, Safety/judgement Orientation Level: Disoriented to, Time, Situation, Place Current Attention Level: Selective Memory: Decreased short-term memory Following Commands: Follows one step commands consistently, Follows multi-step commands with increased time Safety/Judgement: Decreased awareness of safety, Decreased awareness of deficits Problem Solving: Slow processing, Difficulty sequencing, Requires verbal cues, Requires tactile cues General Comments: oriented to place, situation this pm and more alert and interactive   Blood pressure (P) 126/69, pulse (P) 63, temperature (P) 98.2 F (36.8 C), temperature source (P) Oral, resp. rate (P) 18, height 5\' 10"  (1.778 m), weight 74.8 kg, SpO2 (P) 99 %. Physical Exam Vitals and nursing note reviewed.  Constitutional:      General: He is not in acute distress.    Appearance: Normal appearance.  HENT:     Head: Normocephalic and atraumatic.     Right Ear: External ear normal.     Left Ear: External ear normal.     Nose: Nose normal.  Eyes:     General:        Right eye: No discharge.        Left eye: No discharge.     Extraocular Movements: Extraocular movements intact.  Cardiovascular:     Rate and Rhythm: Normal rate and regular rhythm.  Pulmonary:     Effort: Pulmonary effort is normal. No respiratory distress.      Breath sounds: No stridor.  Abdominal:     General: Abdomen is flat. There is no distension.     Palpations: Abdomen is soft.  Musculoskeletal:     Comments: No edema or tenderness in extremities  Skin:    General: Skin is warm and dry.  Neurological:     Mental Status: He is alert.     Comments: Dysarthria HOH Alert and oriented to person place and time, but unable to name President despite max cues  He was able to follow simple motor commands.                 Motor: 5/5 throughout      Psychiatric:        Speech: Speech is slurred.        Behavior: Behavior is slowed.     Results for orders placed or performed during the hospital encounter of 11/03/19 (from the past 48 hour(s))  Comprehensive metabolic panel     Status: Abnormal   Collection Time: 11/09/19  4:31 AM  Result Value Ref Range   Sodium 134 (L) 135 - 145 mmol/L   Potassium 4.1 3.5 - 5.1 mmol/L   Chloride 102 98 - 111 mmol/L   CO2 24 22 - 32 mmol/L   Glucose, Bld 112 (H) 70 - 99 mg/dL    Comment: Glucose reference range applies only to samples taken after fasting for at least 8 hours.   BUN 12  8 - 23 mg/dL   Creatinine, Ser 0.89 0.61 - 1.24 mg/dL   Calcium 8.5 (L) 8.9 - 10.3 mg/dL   Total Protein 6.5 6.5 - 8.1 g/dL   Albumin 2.7 (L) 3.5 - 5.0 g/dL   AST 38 15 - 41 U/L   ALT 35 0 - 44 U/L   Alkaline Phosphatase 59 38 - 126 U/L   Total Bilirubin 0.7 0.3 - 1.2 mg/dL   GFR calc non Af Amer >60 >60 mL/min   GFR calc Af Amer >60 >60 mL/min   Anion gap 8 5 - 15    Comment: Performed at [facility] [address] [place], [place] [address]  Comprehensive metabolic panel     Status: Abnormal   Collection Time: 11/10/19  3:52 AM  Result Value Ref Range   Sodium 134 (L) 135 - 145 mmol/L   Potassium 4.3 3.5 - 5.1 mmol/L   Chloride 103 98 - 111 mmol/L   CO2 23 22 - 32 mmol/L   Glucose, Bld 102 (H) 70 - 99 mg/dL    Comment: Glucose reference range applies only to samples taken after fasting for at least 8  hours.   BUN 12 8 - 23 mg/dL   Creatinine, Ser 0.78 0.61 - 1.24 mg/dL   Calcium 8.7 (L) 8.9 - 10.3 mg/dL   Total Protein 6.6 6.5 - 8.1 g/dL   Albumin 2.7 (L) 3.5 - 5.0 g/dL   AST 35 15 - 41 U/L   ALT 39 0 - 44 U/L   Alkaline Phosphatase 61 38 - 126 U/L   Total Bilirubin 0.7 0.3 - 1.2 mg/dL   GFR calc non Af Amer >60 >60 mL/min   GFR calc Af Amer >60 >60 mL/min   Anion gap 8 5 - 15    Comment: Performed at [facility] [address], [place], [place] [address]  CBC with Differential/Platelet     Status: Abnormal   Collection Time: 11/10/19  3:52 AM  Result Value Ref Range   WBC 9.6 4.0 - 10.5 K/uL   RBC 4.10 (L) 4.22 - 5.81 MIL/uL   Hemoglobin 13.7 13.0 - 17.0 g/dL   HCT 40.8 39 - 52 %   MCV 99.5 80.0 - 100.0 fL   MCH 33.4 26.0 - 34.0 pg   MCHC 33.6 30.0 - 36.0 g/dL   RDW 12.4 11.5 - 15.5 %   Platelets 169 150 - 400 K/uL   nRBC 0.0 0.0 - 0.2 %   Neutrophils Relative % 68 %   Neutro Abs 6.6 1.7 - 7.7 K/uL   Lymphocytes Relative 16 %   Lymphs Abs 1.5 0.7 - 4.0 K/uL   Monocytes Relative 10 %   Monocytes Absolute 1.0 0 - 1 K/uL   Eosinophils Relative 4 %   Eosinophils Absolute 0.3 0 - 0 K/uL   Basophils Relative 1 %   Basophils Absolute 0.1 0 - 0 K/uL   Immature Granulocytes 1 %   Abs Immature Granulocytes 0.06 0.00 - 0.07 K/uL    Comment: Performed at [facility] [address], [place], [place] [address]  Magnesium     Status: None   Collection Time: 11/10/19  3:52 AM  Result Value Ref Range   Magnesium 1.8 1.7 - 2.4 mg/dL    Comment: Performed at [facility] [address], [place], [place] [address]   No results found.     Medical Problem List and Plan: 1.  Balance deficits, flexed posture, shuffling gait, cognitive deficits, impairments in attention, dysphagia secondary to TBI with bilateral SAH.  -patient may not shower  -ELOS/Goals: 7-10 days/supervision  Admit to CIR 2.  Antithrombotics: -DVT/anticoagulation:  Mechanical: Sequential  compression devices, below knee Bilateral lower extremities  -antiplatelet therapy: N/A 3. Persistent Headaches/Pain Management: Getting a little better --continue Oxycodone prn.   Will plan to transition to nonnarcotics given history. 4. Mood: LCSW to follow for evaluation and support.   -antipsychotic agents: N/A 5. Neuropsych: This patient is not capable of making decisions on his own behalf. 6. Skin/Wound Care: Routine Routine pressure relief measures.  7. Fluids/Electrolytes/Nutrition: Monitor I/O.  CMP ordered. 8. Alcohol abuse with DTs: Continue thiamine and folic acid. On clonidine taper--BP stable.    Continue to monitor closely. 9.  Dysphagia: Diet modified to dysphagia 1, thin liquids on 07/16.  Advance diet as tolerated 10. H/o RA: Managed with Meloxicam-- was resumed on 07/15. Monitor for GI irritation or signs of bleeding.  11. Hyponatremia/Hypomagnesemia: Likely due to ETOH abuse.   CMP ordered. 12. Hepatic steatosis/Chronic pancreatitis: Continue Thiamine and Folic acid.   CMP ordered.  Bary Leriche, PA-C 11/10/2019  I have personally performed a face to face diagnostic evaluation, including, but not limited to relevant history and physical exam findings, of this patient and developed relevant assessment and plan.  Additionally, I have reviewed and concur with the physician assistant's documentation above.  Delice Lesch, MD, ABPMR  The patient's status has not changed. The original post admission physician evaluation remains appropriate, and any changes from the pre-admission screening or documentation from the acute chart are noted above.   Delice Lesch, MD, ABPMR

## 2019-11-10 NOTE — TOC Transition Note (Signed)
Transition of Care [facility]) - CM/SW Discharge Note   Patient Details  Name: William Gibson MRN: [id number] Date of Birth: 27-Feb-1946  [facility] CM/SW Contact:  Pollie Friar, RN Phone Number: 11/10/2019, 11:40 AM   Clinical Narrative:    Pt is discharging to CIR today. CM is signing off.    Final next level of care: IP Rehab Facility Barriers to Discharge: No Barriers Identified   Patient Goals and CMS Choice        Discharge Placement                       Discharge Plan and Services                                     Social Determinants of Health (SDOH) Interventions     Readmission Risk Interventions No flowsheet data found.

## 2019-11-10 NOTE — Progress Notes (Addendum)
  Speech Language Pathology Treatment: Dysphagia  Patient Details Name: William Gibson MRN: [id number] DOB: Apr 11, 1946 Today's Date: 11/10/2019 Time: 4656-8127 SLP Time Calculation (min) (ACUTE ONLY): 11 min  Assessment / Plan / Recommendation Clinical Impression  Pt is more alert and shows improving sustained attention to self-feeding. Despite improvements in his mentation he continues to exhibit a moderate dysarthria and signs of dysphagia, including multiple, audible subswallows. There is explosive, wet coughing when taking small sips of water - seemingly much more reactive than during initial evaluation. SLP also provided trials of nectar thick liquids with single swallows and no coughing. Recommend proceeding with MBS to better evaluate oropharyngeal function. Pt agreeable but would like it to be done quickly as he wants to go home today. Test is tentatively scheduled for later this morning, but continue to recommend post-acute SLP follow up.    HPI HPI: Patient is a 74 year old white male with a history of alcoholism, rheumatoid arthritis, alcoholic liver disease, tobacco use, previous CVA, GERD.  Patient was at home doing some work outside when he fell and hit the back of his head on the concrete driveway with positive LOC.  Head CT positive for subarachnoid hemorrhage and basilar skull fracture.      SLP Plan  MBS       Recommendations  Diet recommendations: Dysphagia 2 (fine chop);Nectar-thick liquid Liquids provided via: Cup;Straw Medication Administration: Whole meds with puree Supervision: Patient able to self feed;Full supervision/cueing for compensatory strategies Compensations: Slow rate;Small sips/bites;Minimize environmental distractions Postural Changes and/or Swallow Maneuvers: Seated upright 90 degrees                Oral Care Recommendations: Oral care BID Follow up Recommendations: Inpatient Rehab SLP Visit Diagnosis: Dysphagia, oropharyngeal phase  (R13.12) Plan: MBS       GO                Osie Bond., M.A. [facility] Pager [phone] Office [phone]  11/10/2019, 9:28 AM

## 2019-11-11 ENCOUNTER — Inpatient Hospital Stay (HOSPITAL_COMMUNITY): Payer: 59 | Admitting: Physical Therapy

## 2019-11-11 ENCOUNTER — Inpatient Hospital Stay (HOSPITAL_COMMUNITY): Payer: 59 | Admitting: Speech Pathology

## 2019-11-11 ENCOUNTER — Inpatient Hospital Stay (HOSPITAL_COMMUNITY): Payer: 59

## 2019-11-11 MED ORDER — THIAMINE HCL 100 MG PO TABS
100.0000 mg | ORAL_TABLET | Freq: Every day | ORAL | Status: DC
  Administered 2019-11-11 – 2019-11-23 (×13): 100 mg via ORAL
  Filled 2019-11-11 (×14): qty 1

## 2019-11-11 MED ORDER — FOLIC ACID 1 MG PO TABS
1.0000 mg | ORAL_TABLET | Freq: Every day | ORAL | Status: DC
  Administered 2019-11-11 – 2019-11-23 (×13): 1 mg via ORAL
  Filled 2019-11-11 (×13): qty 1

## 2019-11-11 NOTE — Progress Notes (Signed)
Inpatient Rehabilitation Medication Review by a Pharmacist  A complete drug regimen review was completed for this patient to identify any potential clinically significant medication issues.  Clinically significant medication issues were identified:  yes  7/17 discussed with MD thiamine and folic acid reordered.  Bonnita Nasuti Pharm.D. CPP, BCPS Clinical Pharmacist [phone] 11/11/2019 8:43 AM      Type of Medication Issue Identified Description of Issue Urgent (address now) Non-Urgent (address on AM team rounds) Plan Plan Accepted by Provider? (Yes / No / Pending AM Rounds)  Drug Interaction(s) (clinically significant)       Duplicate Therapy       Allergy       No Medication Administration End Date       Incorrect Dose       Additional Drug Therapy Needed   Add thiamine and folic acid for h/o EtOH w DTs   Non urgent  Contact MD to add missing supplements   Other         Pharmacist comments: missing medication therapy, non urgent. Communication will made to provider on rounds tomorrow.  Time spent performing this drug regimen review (minutes):  15 mins

## 2019-11-11 NOTE — Progress Notes (Signed)
Patient transferred to [phone]. Report given to nurse.

## 2019-11-11 NOTE — Progress Notes (Signed)
[place] PHYSICAL MEDICINE & REHABILITATION PROGRESS NOTE   Subjective/Complaints: Pt reports fair night. RN reports that he was restless, initiated telesitter  ROS: Limited due to cognitive/behavioral    Objective:   DG Swallowing Func-Speech Pathology  Result Date: 11/10/2019 Objective Swallowing Evaluation: Type of Study: MBS-Modified Barium Swallow Study  Patient Details Name: BRECK MARYLAND MRN: [id number] Date of Birth: [birth year] Today's Date: 11/10/2019 Time: SLP Start Time (ACUTE ONLY): 1137 -SLP Stop Time (ACUTE ONLY): 1205 SLP Time Calculation (min) (ACUTE ONLY): 28 min Past Medical History: Past Medical History: Diagnosis Date . Abnormal liver function tests 12/15/2018 . Alcoholic liver disease ([place]) 12/15/2018 . Alcoholism ([place])  . Elevated LDL cholesterol level 05/22/2013 . Epistaxis  . Erectile dysfunction 05/22/2013 . Frequent falls 11/09/2018 . GERD (gastroesophageal reflux disease)  . Hyperammonemia ([place]) 11/15/2018 . Memory change 08/17/2017 . RA (rheumatoid arthritis) ([place]) 08/17/2017 . RBBB 12/15/2018 . Rheumatoid arthritis ([place]) 2010 . Shuffling gait 12/19/2018 . Tobacco abuse 04/26/2012 . Weight loss, unintentional 11/09/2018 . 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 NASAL ENDOSCOPY WITH EPISTAXIS CONTROL N/A 04/12/2018  Procedure: NASAL ENDOSCOPY WITH EPISTAXIS CONTROL;  Surgeon: Melissa Montane, MD;  Location: [facility];  Service: ENT;  Laterality: N/A; . VASECTOMY   . VASECTOMY REVERSAL   HPI: Patient is a 74 year old white male with a history of alcoholism, rheumatoid arthritis, alcoholic liver disease, tobacco use, previous CVA, GERD.  Patient was at home doing some work outside when he fell and hit the back of his head on the concrete driveway with positive LOC.  Head CT positive for subarachnoid hemorrhage and basilar skull fracture.  Subjective: alert, cooperative, says his throat hurts Assessment /  Plan / Recommendation CHL IP CLINICAL IMPRESSIONS 11/10/2019 Clinical Impression Pt has a moderate oropharyngeal dysphagia secondary to impaired timing, coordination, and strength. His oral preparation is disorganized, not collecting liquids into well formed boluses. There is sublingual and posterior spilling with mild oral residue. He also has reduced hyolaryngeal movement, epiglottic inversion, base of tongue retraction, and laryngeal vestibule closure, so when nectar thick liquids reach beyond his epiglottis before the swallow, he silently aspirates. He also aspirates after the swallow on moderate amounts of residue left from thickened liquids and soft solids. A chin tuck does not significantly change function, but he has improved bolus cohesion, timing, and pharyngeal efficiency when cued to "swallow hard" with purees and nectar thick liquids via spoon. Recommend downgrading diet to purees and continuing nectar thick liquids by spoon only. Full supervision should be provided to provide cues to swallow hard throughout meals to further increase safety. Daughter, [name], was also present and educated on results.  SLP Visit Diagnosis Dysphagia, oropharyngeal phase (R13.12) Attention and concentration deficit following -- Frontal lobe and executive function deficit following -- Impact on safety and function Moderate aspiration risk   CHL IP TREATMENT RECOMMENDATION 11/10/2019 Treatment Recommendations Therapy as outlined in treatment plan below   Prognosis 11/10/2019 Prognosis for Safe Diet Advancement Good Barriers to Reach Goals Cognitive deficits Barriers/Prognosis Comment -- CHL IP DIET RECOMMENDATION 11/10/2019 SLP Diet Recommendations Dysphagia 1 (Puree) solids;Nectar thick liquid Liquid Administration via Spoon Medication Administration Crushed with puree Compensations Slow rate;Small sips/bites;Minimize environmental distractions;Effortful swallow Postural Changes Seated upright at 90 degrees   CHL IP OTHER  RECOMMENDATIONS 11/10/2019 Recommended Consults -- Oral Care Recommendations Oral care QID Other Recommendations Order thickener from pharmacy;Prohibited food (jello, ice cream, thin soups);Remove water pitcher   CHL  IP FOLLOW UP RECOMMENDATIONS 11/10/2019 Follow up Recommendations Inpatient Rehab   CHL IP FREQUENCY AND DURATION 11/10/2019 Speech Therapy Frequency (ACUTE ONLY) min 2x/week Treatment Duration 2 weeks      CHL IP ORAL PHASE 11/10/2019 Oral Phase Impaired Oral - Pudding Teaspoon -- Oral - Pudding Cup -- Oral - Honey Teaspoon -- Oral - Honey Cup Reduced posterior propulsion;Delayed oral transit;Decreased bolus cohesion;Premature spillage;Lingual/palatal residue Oral - Nectar Teaspoon Reduced posterior propulsion;Delayed oral transit;Lingual/palatal residue Oral - Nectar Cup Reduced posterior propulsion;Delayed oral transit;Decreased bolus cohesion;Premature spillage;Lingual/palatal residue Oral - Nectar Straw -- Oral - Thin Teaspoon -- Oral - Thin Cup -- Oral - Thin Straw -- Oral - Puree Reduced posterior propulsion;Delayed oral transit;Decreased bolus cohesion;Lingual/palatal residue Oral - Mech Soft Reduced posterior propulsion;Delayed oral transit;Decreased bolus cohesion;Lingual/palatal residue;Impaired mastication Oral - Regular -- Oral - Multi-Consistency -- Oral - Pill -- Oral Phase - Comment --  CHL IP PHARYNGEAL PHASE 11/10/2019 Pharyngeal Phase Impaired Pharyngeal- Pudding Teaspoon -- Pharyngeal -- Pharyngeal- Pudding Cup -- Pharyngeal -- Pharyngeal- Honey Teaspoon -- Pharyngeal -- Pharyngeal- Honey Cup Reduced epiglottic inversion;Reduced anterior laryngeal mobility;Reduced laryngeal elevation;Reduced airway/laryngeal closure;Reduced tongue base retraction;Pharyngeal residue - valleculae;Pharyngeal residue - pyriform;Delayed swallow initiation-vallecula;Penetration/Apiration after swallow Pharyngeal Material enters airway, passes BELOW cords without attempt by patient to eject out (silent  aspiration) Pharyngeal- Nectar Teaspoon Reduced epiglottic inversion;Reduced anterior laryngeal mobility;Reduced laryngeal elevation;Reduced airway/laryngeal closure;Reduced tongue base retraction;Compensatory strategies attempted (with notebox);Pharyngeal residue - valleculae Pharyngeal -- Pharyngeal- Nectar Cup Reduced epiglottic inversion;Reduced anterior laryngeal mobility;Reduced laryngeal elevation;Reduced airway/laryngeal closure;Reduced tongue base retraction;Pharyngeal residue - valleculae;Pharyngeal residue - pyriform;Delayed swallow initiation-vallecula;Delayed swallow initiation-pyriform sinuses;Penetration/Aspiration during swallow;Penetration/Apiration after swallow Pharyngeal Material enters airway, passes BELOW cords without attempt by patient to eject out (silent aspiration) Pharyngeal- Nectar Straw -- Pharyngeal -- Pharyngeal- Thin Teaspoon -- Pharyngeal -- Pharyngeal- Thin Cup -- Pharyngeal -- Pharyngeal- Thin Straw -- Pharyngeal -- Pharyngeal- Puree Reduced epiglottic inversion;Reduced anterior laryngeal mobility;Reduced laryngeal elevation;Reduced airway/laryngeal closure;Reduced tongue base retraction;Pharyngeal residue - valleculae;Pharyngeal residue - pyriform;Delayed swallow initiation-vallecula;Compensatory strategies attempted (with notebox) Pharyngeal -- Pharyngeal- Mechanical Soft Reduced epiglottic inversion;Reduced anterior laryngeal mobility;Reduced laryngeal elevation;Reduced airway/laryngeal closure;Reduced tongue base retraction;Pharyngeal residue - valleculae;Pharyngeal residue - pyriform;Penetration/Apiration after swallow;Penetration/Aspiration during swallow Pharyngeal Material enters airway, passes BELOW cords without attempt by patient to eject out (silent aspiration) Pharyngeal- Regular -- Pharyngeal -- Pharyngeal- Multi-consistency -- Pharyngeal -- Pharyngeal- Pill -- Pharyngeal -- Pharyngeal Comment --  CHL IP CERVICAL ESOPHAGEAL PHASE 11/10/2019 Cervical Esophageal Phase  Impaired Pudding Teaspoon -- Pudding Cup -- Honey Teaspoon -- Honey Cup Reduced cricopharyngeal relaxation Nectar Teaspoon Reduced cricopharyngeal relaxation Nectar Cup Reduced cricopharyngeal relaxation Nectar Straw -- Thin Teaspoon -- Thin Cup -- Thin Straw -- Puree Reduced cricopharyngeal relaxation Mechanical Soft Reduced cricopharyngeal relaxation Regular -- Multi-consistency -- Pill -- Cervical Esophageal Comment -- Osie Bond., M.A. CCC-SLP Acute Rehabilitation Services Pager [phone] Office [phone] 11/10/2019, 2:49 PM              Recent Labs    11/10/19 0352  WBC 9.6  HGB 13.7  HCT 40.8  PLT 169   Recent Labs    11/09/19 0431 11/10/19 0352  NA 134* 134*  K 4.1 4.3  CL 102 103  CO2 24 23  GLUCOSE 112* 102*  BUN 12 12  CREATININE 0.89 0.78  CALCIUM 8.5* 8.7*    Intake/Output Summary (Last 24 hours) at 11/11/2019 0859 Last data filed at 11/11/2019 0843 Gross per 24 hour  Intake 120 ml  Output --  Net 120 ml  Physical Exam: Vital Signs Blood pressure (!) 130/96, pulse 65, temperature 98.1 F (36.7 C), temperature source Oral, resp. rate 16, height 5\' 9"  (1.753 m), weight 72.8 kg, SpO2 99 %.  General: Alert and oriented x 3, No apparent distress HEENT: Head is normocephalic, atraumatic, PERRLA, EOMI, sclera anicteric, oral mucosa pink and moist, dentition intact, ext ear canals clear,  Neck: Supple without JVD or lymphadenopathy Heart: Reg rate and rhythm. No murmurs rubs or gallops Chest: CTA bilaterally without wheezes, rales, or rhonchi; no distress Abdomen: Soft, non-tender, non-distended, bowel sounds positive. Extremities: No clubbing, cyanosis, or edema. Pulses are 2+ Skin: Clean and intact without signs of breakdown Neuro: alert, speech dysarthric. Follows basic commands. Oriented to person not place. Moves all 4's. Senses pain in all 4's.  Musculoskeletal: Full ROM, No pain with AROM or PROM in the neck, trunk, or extremities. Posture  appropriate Psych: Pt's affect is flat    Assessment/Plan: 1. Functional deficits secondary to TBI which require 3+ hours per day of interdisciplinary therapy in a comprehensive inpatient rehab setting.  Physiatrist is providing close team supervision and 24 hour management of active medical problems listed below.  Physiatrist and rehab team continue to assess barriers to discharge/monitor patient progress toward functional and medical goals  Care Tool:  Bathing              Bathing assist       Upper Body Dressing/Undressing Upper body dressing   What is the patient wearing?: Hospital gown only    Upper body assist Assist Level: Moderate Assistance - Patient 50 - 74%    Lower Body Dressing/Undressing Lower body dressing            Lower body assist       Toileting Toileting    Toileting assist Assist for toileting: Minimal Assistance - Patient > 75%     Transfers Chair/bed transfer  Transfers assist     Chair/bed transfer assist level: Minimal Assistance - Patient > 75%     Locomotion Ambulation   Ambulation assist              Walk 10 feet activity   Assist           Walk 50 feet activity   Assist           Walk 150 feet activity   Assist           Walk 10 feet on uneven surface  activity   Assist           Wheelchair     Assist               Wheelchair 50 feet with 2 turns activity    Assist            Wheelchair 150 feet activity     Assist          Blood pressure (!) 130/96, pulse 65, temperature 98.1 F (36.7 C), temperature source Oral, resp. rate 16, height 5\' 9"  (1.753 m), weight 72.8 kg, SpO2 99 %.  Medical Problem List and Plan: 1.  Balance deficits, flexed posture, shuffling gait, cognitive deficits, impairments in attention, dysphagia secondary to TBI with bilateral SAH.             -patient may not shower             -ELOS/Goals: 7-10 days/supervision              -Patient is beginning CIR  therapies today including PT, OT, and SLP  2.  Antithrombotics: -DVT/anticoagulation:  Mechanical: Sequential compression devices, below knee Bilateral lower extremities             -antiplatelet therapy: N/A 3. Persistent Headaches/Pain Management: Getting a little better --continue Oxycodone prn.               -plan to transition to nonnarcotics given history. 4. Mood: LCSW to follow for evaluation and support.              -antipsychotic agents: N/A  -start sleep chart 5. Neuropsych: This patient is not capable of making decisions on his own behalf. 6. Skin/Wound Care: Routine Routine pressure relief measures.  7. Fluids/Electrolytes/Nutrition: Monitor I/O.  CMP ordered. 8. Alcohol abuse with DTs: resume thiamine and folic acid.   -clonidine taper--BP stable.               Continue to monitor closely. 9.  Dysphagia: Diet modified to dysphagia 1, thin liquids on 07/16.             Advance diet as tolerated per SLP 10. H/o RA: Managed with Meloxicam-- was resumed on 07/15. Monitor for GI irritation or signs of bleeding.  11. Hyponatremia/Hypomagnesemia: Likely due to ETOH abuse.              CMP ordered. 12. Hepatic steatosis/Chronic pancreatitis: Continue Thiamine and Folic acid.              CMP ordered.    LOS: 1 days A FACE TO FACE EVALUATION WAS PERFORMED  Meredith Staggers 11/11/2019, 8:59 AM

## 2019-11-11 NOTE — Evaluation (Signed)
Physical Therapy Assessment and Plan  Patient Details  Name: William Gibson MRN: [id number] Date of Birth: 08/11/1945  PT Diagnosis: Abnormal posture, Abnormality of gait, Ataxia, Cognitive deficits, Difficulty walking, Impaired cognition and Muscle weakness Rehab Potential: Good ELOS: 10-14 days   Today's Date: 11/11/2019 PT Individual Time: 1478-2956 PT Individual Time Calculation (min): 61 min    Hospital Problem: Principal Problem:   TBI (traumatic brain injury) ([place])   Past Medical History:  Past Medical History:  Diagnosis Date  . Abnormal liver function tests 12/15/2018  . Alcoholic liver disease ([place]) 12/15/2018  . Alcoholism ([place])   . Elevated LDL cholesterol level 05/22/2013  . Epistaxis   . Erectile dysfunction 05/22/2013  . Frequent falls 11/09/2018  . GERD (gastroesophageal reflux disease)   . Hyperammonemia ([place]) 11/15/2018  . Memory change 08/17/2017  . RA (rheumatoid arthritis) ([place]) 08/17/2017  . RBBB 12/15/2018  . Rheumatoid arthritis ([place]) 2010  . Shuffling gait 12/19/2018  . Tobacco abuse 04/26/2012  . Weight loss, unintentional 11/09/2018  . 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 12/15/2018   Past Surgical History:  Past Surgical History:  Procedure Laterality Date  . NASAL ENDOSCOPY WITH EPISTAXIS CONTROL N/A 04/12/2018   Procedure: NASAL ENDOSCOPY WITH EPISTAXIS CONTROL;  Surgeon: Melissa Montane, MD;  Location: [facility];  Service: ENT;  Laterality: N/A;  . VASECTOMY    . VASECTOMY REVERSAL      Assessment & Plan Clinical Impression: Patient is a 74 y.o. year old male with history of RA,HTN, fatty liver due to ETOH abuse, gait disorder with shuffling flexed posture and increased in falls X a week who was admitted on 11/03/2019 after a fall in his driveway, where he struck his head.  History taken from chart review and daughter due to cognition.  Approximately 10 minutes LOC.  Thereafter, he reported headaches.  He was intoxicated on admission with a EtOH level  of 190.  Imaging revealed bilateral mild to moderate SAH along bilateral tentorium and along right temporal and left frontal lobe as well as acute nondisplaced fracture of occipital region extending inferiorly to level of foramen magnum.  Dr. Vertell Limber was consulted for input and recommended conservative management.  Follow-up CT head was stable.  He was admitted for observation and developed EtOH withdrawal with DTs requiring CIWA protocol as well as Precedex.    PCCM has been following for support and patient's respiratory status has been stable. He was weaned off Precedex, tapered off Librium and required ativan due to hallucinations. He did develop somnolence post ativan dose. He was  placed on clonidine taper to complete detox due to ongoing issues with hallucinations and confusion.  His lethargy has resolved with improvement in mental status.  He continues to have issues with balance deficits with flexed posture and shuffling gait as well as decrease in right foot clearance, cognitive deficits with impairments in attention, moderate dysarthria with dysphagia.  Hospital course further complicated by dysphagia.  MBS done today and diet modified to dysphagia 1, thin liquids.  Therapy has been ongoing and CIR was recommended due to functional decline. .  Patient transferred to CIR on 11/10/2019 .   Patient currently requires mod assist with mobility secondary to muscle weakness, decreased cardiorespiratoy endurance, impaired timing and sequencing, unbalanced muscle activation, ataxia and decreased coordination, decreased attention to left, decreased awareness, decreased problem solving, decreased safety awareness and delayed processing and decreased sitting balance, decreased standing balance, decreased postural control and decreased balance strategies.  Prior to  hospitalization, patient was modified independent  with mobility and lived with Spouse in a House home.  Home access is 3 STE front door with B HRs,  step-up onto porch then step into house; 5-6steps at back entrance with B HRs .  Patient will benefit from skilled PT intervention to maximize safe functional mobility, minimize fall risk and decrease caregiver burden for planned discharge home with 24 hour supervision.  Anticipate patient will benefit from follow up OP at discharge.  PT - End of Session Activity Tolerance: Tolerates 30+ min activity with multiple rests Endurance Deficit: Yes Endurance Deficit Description: requires seated rest breaks PT Assessment Rehab Potential (ACUTE/IP ONLY): Good PT Barriers to Discharge: Home environment access/layout;Behavior PT Patient demonstrates impairments in the following area(s): Balance;Perception;Behavior;Safety;Edema;Sensory;Endurance;Skin Integrity;Motor;Nutrition;Pain PT Transfers Functional Problem(s): Bed Mobility;Bed to Chair;Car;Furniture;Floor PT Locomotion Functional Problem(s): Ambulation;Stairs PT Plan PT Intensity: Minimum of 1-2 x/day ,45 to 90 minutes PT Frequency: 5 out of 7 days PT Duration Estimated Length of Stay: 10-14 days PT Treatment/Interventions: Ambulation/gait training;Community reintegration;DME/adaptive equipment instruction;Neuromuscular re-education;Psychosocial support;Stair training;UE/LE Strength taining/ROM;Balance/vestibular training;Discharge planning;Functional electrical stimulation;Pain management;Skin care/wound management;Therapeutic Activities;UE/LE Coordination activities;Cognitive remediation/compensation;Disease management/prevention;Functional mobility training;Patient/family education;Splinting/orthotics;Therapeutic Exercise;Visual/perceptual remediation/compensation PT Transfers Anticipated Outcome(s): supervision using LRAD PT Locomotion Anticipated Outcome(s): supervision using LRAD PT Recommendation Recommendations for Other Services: Neuropsych consult Follow Up Recommendations: 24 hour supervision/assistance;Outpatient PT Patient  destination: Home Equipment Recommended: To be determined  Skilled Therapeutic Intervention Evaluation completed (see details above and below) with education on PT POC and goals and individual treatment initiated with focus on activity tolerance, transfers, gait training, stair navigation, and pt/family education regarding daily therapy schedule, weekly team meetings, purpose of PT evaluation, and other CIR information. Pt received sitting in w/c with Marjorie Smolder, RN present for medication administration due to pt reporting headache pain. Pt's wife and daughter present - pt agreeable to therapy session. Transported to/from gym in w/c for time management and energy conservation. Gait training ~255f, no AD, with min assist ambulating straight and mod assist when turning - demos forward trunk flexion causing anterior lean/LOB, narrow BOS shuffling feet against each other during swing, decreased B LE step lengths, decreased gait speed, and absent trunk rotation or arm swing with pt demoing guarded/rigid trunk posture. Pt reports he felt that if therapist wasn't guarding him he would have fallen down forward to the L. Ascended/descended 12 steps using B HRs with min assist for balance - demos reciprocal pattern on ascent and step-to pattern on descent, increased reliance on BUE support . Ambulatory simulated car transfer (SUV height), no AD, with min assist for balance - demos stepping into vehicle sideways - educated on sitting down first then rotating feet into car. Ambulated ~155fup/down ramp with pt self-selecting to use available handrail with min assist for balance. Pt demos ability to pick up cup from the floor with min assist for balance. Therapist retrieved w/c cushion to provide increased pressure relief and improved upright OOB activity tolerance. Transported back to room. Pt requesting to change into the clothes his family brought. Sitting in w/c donned/doffed shirt max assist for time management.  Sit<>stands to/from w/c with min assist for balance while donning/doffing LB clothing with mod/max assist for time management. Pt left seated in w/c with needs in reach, seat belt alarm on, and family with RN present to assume care of patient.  PT Evaluation Precautions/Restrictions Precautions Precautions: Fall;Other (comment) Precaution Comments: forward trunk flexion in standing/gait Restrictions Weight Bearing Restrictions: No Pain Pain Assessment Pain Scale: 0-10 Pain Score: 6  Pain Type: Acute pain Pain Location: Head Pain Descriptors / Indicators: Headache;Aching Pain Frequency: Intermittent Pain Onset: On-going Pain Intervention(s): Medication (See eMAR);Relaxation;Rest Home Living/Prior Functioning Home Living Available Help at Discharge: Family;Available 24 hours/day (wife currently working from home but may have to go back into office in August) Type of Home: House Home Access: Stairs to enter [employer] of Steps: 3 STE front door with B HRs, step-up onto porch then step into house; 5-6steps at back entrance with B HRs Entrance Stairs-Rails: Right;Left  Home Layout: One level Bathroom Shower/Tub: Multimedia programmer: Standard Additional Comments: walking stick; states has a chair he can put into the shower  Lives With: Spouse Prior Function Level of Independence: Independent with gait;Independent with transfers  Able to Take Stairs?: Yes Driving: Yes (pt reports he does not feel comfortable driving so has very limited driving) Comments: reports over a month of increased unsteadiness and increased falls with more significant impairements noted in 2 weeks prior to admission - been using wooden walking stick for a couple years (but does not use it consistently) Vision/Perception - pt's daughter reports pt lost his glasses a few years ago and has not been wearing them since  Perception Perception: Impaired Comments: mild L  inattention Praxis Praxis: Impaired Praxis Impairment Details: Motor planning  Cognition Overall Cognitive Status: Impaired/Different from baseline Arousal/Alertness: Awake/alert Orientation Level: Oriented X4 Attention: Focused;Sustained Focused Attention: Appears intact Sustained Attention: Impaired Sustained Attention Impairment: Verbal basic;Functional basic Awareness: Impaired Behaviors: Impulsive;Restless Safety/Judgment: Impaired [employer] Scales of Cognitive Functioning: Confused/appropriate Sensation Sensation Light Touch: Appears Intact Hot/Cold: Not tested Proprioception: Impaired by gross assessment (impaired proprioceptive awareness of B LE positioning during functional mobility) Coordination Gross Motor Movements are Fluid and Coordinated: No Coordination and Movement Description: gross motor movements impaired due to impaired B LE coordination, impaired balance, and impaired trunk alignment in standing Motor  Motor Motor: Ataxia;Abnormal postural alignment and control;Other (comment) Motor - Skilled Clinical Observations: forward flexed trunk standing/ambulating  Mobility Bed Mobility Bed Mobility: Supine to Sit;Sit to Supine Supine to Sit: Minimal Assistance - Patient > 75% Sit to Supine: Minimal Assistance - Patient > 75% Transfers Transfers: Sit to Stand;Stand to Sit;Stand Pivot Transfers Sit to Stand: Minimal Assistance - Patient > 75%;Moderate Assistance - Patient 50-74% Stand to Sit: Minimal Assistance - Patient > 75%;Moderate Assistance - Patient 50-74% Stand Pivot Transfers: Minimal Assistance - Patient > 75%;Moderate Assistance - Patient 50 - 74% Stand Pivot Transfer Details: Verbal cues for technique;Verbal cues for sequencing;Verbal cues for precautions/safety;Visual cues/gestures for sequencing;Tactile cues for sequencing;Tactile cues for initiation;Tactile cues for weight shifting;Tactile cues for posture Transfer (Assistive device):  None Locomotion  Gait Ambulation: Yes Gait Assistance: Minimal Assistance - Patient > 75%;Moderate Assistance - Patient 50-74% Gait Distance (Feet): 226 Feet Assistive device: None Gait Assistance Details: Tactile cues for sequencing;Tactile cues for initiation;Tactile cues for weight shifting;Tactile cues for posture;Verbal cues for precautions/safety;Verbal cues for technique;Verbal cues for sequencing;Verbal cues for gait pattern;Manual facilitation for weight shifting Gait Gait: Yes Gait Pattern: Impaired Gait Pattern: Decreased step length - right;Decreased step length - left;Poor foot clearance - left;Poor foot clearance - right;Narrow base of support;Trunk flexed;Decreased trunk rotation;Shuffle Gait velocity: decreased Stairs / Additional Locomotion Stairs: Yes Stairs Assistance: Minimal Assistance - Patient > 75% Stair Management Technique: Two rails Number of Stairs: 12 Height of Stairs: 6 Ramp: Minimal Assistance - Patient >75% (using handrail) Wheelchair Mobility Wheelchair Mobility: No  Trunk/Postural Assessment  Cervical Assessment Cervical Assessment: Exceptions to [facility] (forward head)  Thoracic Assessment Thoracic Assessment: Exceptions to [facility] (thoracic kyphosis with rounded shoulders) Lumbar Assessment Lumbar Assessment: Exceptions to [facility] (posterior pelvic tilt in sitting) Postural Control Postural Control: Deficits on evaluation Trunk Control: poor with excessive anterior trunk flexion standing Righting Reactions: delayed and inadequate Protective Responses: delayed and inadequate Postural Limitations: decreased due to anteriorly displaced center of mass due to trunk posture  Balance Balance Balance Assessed: Yes Dynamic Sitting Balance Dynamic Sitting - Level of Assistance: 4: Min assist Static Standing Balance Static Standing - Balance Support: During functional activity Static Standing - Level of Assistance: 4: Min assist Dynamic Standing Balance Dynamic  Standing - Balance Support: During functional activity Dynamic Standing - Level of Assistance: 3: Mod assist Extremity Assessment  RLE Assessment RLE Assessment: Exceptions to [facility] General Strength Comments: assessed in sitting - pt reports he feels that his legs are weaker RLE Strength Right Hip Flexion: 4+/5 Right Knee Flexion: 4+/5 Right Knee Extension: 4+/5 Right Ankle Dorsiflexion: 4+/5 Right Ankle Plantar Flexion: 4+/5 LLE Assessment LLE Assessment: Exceptions to [facility] General Strength Comments: assessed in sitting - pt reports he feels that his legs are weaker LLE Strength Left Hip Flexion: 4+/5 Left Knee Flexion: 4+/5 Left Knee Extension: 4+/5 Left Ankle Dorsiflexion: 4+/5 Left Ankle Plantar Flexion: 4+/5    Refer to Care Plan for Long Term Goals  Recommendations for other services: Neuropsych  Discharge Criteria: Patient will be discharged from PT if patient refuses treatment 3 consecutive times without medical reason, if treatment goals not met, if there is a change in medical status, if patient makes no progress towards goals or if patient is discharged from hospital.  The above assessment, treatment plan, treatment alternatives and goals were discussed and mutually agreed upon: by patient and by family  Tawana Scale , PT, DPT, CSRS  11/11/2019, 12:33 PM

## 2019-11-11 NOTE — Evaluation (Signed)
Speech Language Pathology Assessment and Plan  Patient Details  Name: William Gibson MRN: [id number] Date of Birth: [birth year]  SLP Diagnosis: Dysarthria;Cognitive Impairments;Dysphagia  Rehab Potential: Good ELOS: 10-14 days    Today's Date: 11/11/2019 SLP Individual Time: 3785-8850 SLP Individual Time Calculation (min): 27 min   Hospital Problem: Principal Problem:   TBI (traumatic brain injury) [facility])  Past Medical History:  Past Medical History:  Diagnosis Date  . Abnormal liver function tests 12/15/2018  . Alcoholic liver disease ([place]) 12/15/2018  . Alcoholism ([place])   . Elevated LDL cholesterol level 05/22/2013  . Epistaxis   . Erectile dysfunction 05/22/2013  . Frequent falls 11/09/2018  . GERD (gastroesophageal reflux disease)   . Hyperammonemia ([place]) 11/15/2018  . Memory change 08/17/2017  . RA (rheumatoid arthritis) ([place]) 08/17/2017  . RBBB 12/15/2018  . Rheumatoid arthritis ([place]) 2010  . Shuffling gait 12/19/2018  . Tobacco abuse 04/26/2012  . Weight loss, unintentional 11/09/2018  . 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 12/15/2018   Past Surgical History:  Past Surgical History:  Procedure Laterality Date  . NASAL ENDOSCOPY WITH EPISTAXIS CONTROL N/A 04/12/2018   Procedure: NASAL ENDOSCOPY WITH EPISTAXIS CONTROL;  Surgeon: Melissa Montane, MD;  Location: [facility];  Service: ENT;  Laterality: N/A;  . VASECTOMY    . VASECTOMY REVERSAL      Assessment / Plan / Recommendation Clinical Impression Patient is a 74 y.o.malewith history of HTN, RA, fatty liver, ETOH abuse,shuffling gait flexed gait as well as increase in falls for about a week with lastfallresultingwith LOC ~ 10 minutes on 07/09 and reports of headache. He was intoxicated at admission with ETOH level 190 and CT head done revealing bilateral mild to moderate SAH along bilateral tentorium and along right temporal and left frontal lobe as well as cute non-displaced fracture occipital region extending  inferiorly to level of foramen magnum. Dr. Vertell Limber recommended conservative management and follow up CT head stable. He was admitted for observation and developed ETOH withdrawal with DT's requiring CIWA protocol as well as Precedex. PCCM following for support and patient's respiratory status has been stable and was weaned off Precedex by 07/13. Therapy ongoing and patient continues to be limited by balance deficits with poor safety awareness, impulsivity and lethargy. Patient admitted to [facility] 11/10/19.  Patient demonstrates behaviors consistent with a [place] Level VI characterized by impaired awareness, attention, problem solving, and recall which impacts his safety with functional and familiar tasks. Patient administered the SLUMS and scored 17/30 points with deficits in attention, executive functioning and short-term recall. Mild dysarthria was also noted due to imprecise consonants and a low vocal intensity which impacts his overall speech intelligibility. Due to silent aspiration per most recent MBS on 11/10/19, advanced trials were not administered. However, patient observed with his breakfast meal of Dys. 1 textures with nectar-thick liquids via tsp. Patient demonstrated what appeared to be a delayed swallow initiation and multiple swallows for pharyngeal clearance. Prolonged AP transit and left anterior spillage was also noted. Patient with minimal overt s/s of aspiration and did best with cues to "swallow hard." Patient would benefit from skilled SLP intervention to maximize his swallowing, cognitive and speech function prior to discharge.     Skilled Therapeutic Interventions          Administered a cognitive-linguistic evaluation and BSE, please see above for details.   SLP Assessment  Patient will need skilled Speech Lanaguage Pathology Services during CIR admission    Recommendations  SLP Diet Recommendations:  Dysphagia 1 (Puree);Nectar Liquid Administration via: Spoon Medication Administration:  Crushed with puree Supervision: Patient able to self feed;Full supervision/cueing for compensatory strategies Compensations: Slow rate;Small sips/bites;Minimize environmental distractions;Effortful swallow Postural Changes and/or Swallow Maneuvers: Seated upright 90 degrees Oral Care Recommendations: Oral care BID Recommendations for Other Services: Neuropsych consult Patient destination: Home Follow up Recommendations: Home Health SLP;Outpatient SLP;24 hour supervision/assistance Equipment Recommended: To be determined    SLP Frequency 3 to 5 out of 7 days   SLP Duration  SLP Intensity  SLP Treatment/Interventions 10-14 days  Minumum of 1-2 x/day, 30 to 90 minutes  Cognitive remediation/compensation;Dysphagia/aspiration precaution training;Internal/external aids;Speech/Language facilitation;Therapeutic Activities;Environmental controls;Cueing hierarchy;Functional tasks;Patient/family education    Pain Pain Assessment Pain Scale: 0-10 Pain Score: 6  Pain Type: Acute pain Pain Location: Head Pain Descriptors / Indicators: Headache;Aching Pain Frequency: Intermittent Pain Onset: On-going Pain Intervention(s): Medication (See eMAR);Relaxation;Rest  Prior Functioning Type of Home: House  Lives With: Spouse Available Help at Discharge: Family;Available 24 hours/day (wife currently working from home but may have to go back into office in August)  SLP Evaluation Cognition Overall Cognitive Status: Impaired/Different from baseline Arousal/Alertness: Awake/alert Orientation Level: Oriented X4 Attention: Focused;Sustained Focused Attention: Appears intact Sustained Attention: Impaired Sustained Attention Impairment: Verbal basic;Functional basic Memory: Impaired Memory Impairment: Decreased short term memory;Decreased recall of new information Decreased Short Term Memory: Verbal basic;Functional basic Immediate Memory Recall:  (unable) Memory Recall Sock: Not able to  recall Memory Recall Blue: Not able to recall Memory Recall Bed: Not able to recall Awareness: Impaired Awareness Impairment: Intellectual impairment Problem Solving: Impaired Problem Solving Impairment: Functional basic;Verbal basic Behaviors: Impulsive;Restless Safety/Judgment: Impaired [place] [employer] Scales of Cognitive Functioning: Confused/appropriate  Comprehension Auditory Comprehension Overall Auditory Comprehension: Appears within functional limits for tasks assessed Visual Recognition/Discrimination Discrimination: Not tested Reading Comprehension Reading Status: Not tested Expression Expression Primary Mode of Expression: Verbal Verbal Expression Overall Verbal Expression: Appears within functional limits for tasks assessed Written Expression Dominant Hand: Right Written Expression: Not tested Oral Motor Oral Motor/Sensory Function Overall Oral Motor/Sensory Function: Generalized oral weakness Motor Speech Overall Motor Speech: Impaired Respiration: Within functional limits Phonation: Low vocal intensity Resonance: Within functional limits Articulation: Impaired Level of Impairment: Phrase Intelligibility: Intelligibility reduced Word: 75-100% accurate Phrase: 75-100% accurate Sentence: 75-100% accurate Conversation: 75-100% accurate Motor Planning: Witnin functional limits Effective Techniques: Increased vocal intensity;Over-articulate  Bedside Swallowing Assessment General Date of Onset: 11/03/19 Previous Swallow Assessment: MBS 7/16: Recommended Dys. 1 textures with nectar-thick liquids via spoon Diet Prior to this Study: Dysphagia 1 (puree);Nectar-thick liquids Temperature Spikes Noted: No Respiratory Status: Room air History of Recent Intubation: No Behavior/Cognition: Alert;Cooperative;Requires cueing Oral Cavity - Dentition: Dentures, bottom;Dentures, top Self-Feeding Abilities: Able to feed self Vision: Functional for self-feeding Patient  Positioning: Upright in bed Baseline Vocal Quality: Low vocal intensity Volitional Cough: Weak Volitional Swallow: Able to elicit  Ice Chips Ice chips: Not tested Thin Liquid Thin Liquid: Not tested Nectar Thick Nectar Thick Liquid: Impaired Presentation: Spoon Oral Phase Impairments: Reduced labial seal Oral phase functional implications: Left anterior spillage;Prolonged oral transit Pharyngeal Phase Impairments: Suspected delayed Swallow;Multiple swallows Honey Thick Honey Thick Liquid: Not tested Puree Puree: Impaired Presentation: Self Fed;Spoon Oral Phase Impairments: Reduced labial seal Oral Phase Functional Implications: Left anterior spillage;Prolonged oral transit Pharyngeal Phase Impairments: Suspected delayed Swallow Solid Solid: Not tested BSE Assessment Risk for Aspiration Impact on safety and function: Moderate aspiration risk Other Related Risk Factors: Previous CVA;History of GERD;Cognitive impairment;Deconditioning;Lethargy  Short Term Goals: Week 1: SLP Short Term Goal 1 (Week  1): Patient will consume current diet with minimal overt s/s of aspiration and Min A verbal cues for use of swallowing compensatory strategies. SLP Short Term Goal 2 (Week 1): Patient will perform pharyngeal strengthening exercises with Min verbal cues for accuracy. SLP Short Term Goal 3 (Week 1): Patient will utilize speech intelligibility strategies to achieve ~90% intelligibility at the sentence level with Min verbal cues. SLP Short Term Goal 4 (Week 1): Patient will demonstrate functional problem solving for basic and familiar tasks with Min A verbal cues. SLP Short Term Goal 5 (Week 1): Patient will recall new, daily information with Mod A verbal and visual cues. SLP Short Term Goal 6 (Week 1): Patient will demonstrate selective attention to functional tasks in a mildly distracting enviornment for 30 minutes with Min verbal cues for redirection.  Refer to Care Plan for Long Term  Goals  Recommendations for other services: Neuropsych  Discharge Criteria: Patient will be discharged from SLP if patient refuses treatment 3 consecutive times without medical reason, if treatment goals not met, if there is a change in medical status, if patient makes no progress towards goals or if patient is discharged from hospital.  The above assessment, treatment plan, treatment alternatives and goals were discussed and mutually agreed upon: by patient  Chenee Munns 11/11/2019, 2:54 PM

## 2019-11-11 NOTE — Evaluation (Signed)
Occupational Therapy Assessment and Plan  Patient Details  Name: William Gibson MRN: [id number] Date of Birth: [birth year]  OT Diagnosis: abnormal posture, cognitive deficits, muscle weakness (generalized) and coordination disorder Rehab Potential: Rehab Potential (ACUTE ONLY): Good ELOS: 12-16   Today's Date: 11/11/2019 OT Individual Time:1100-1200 60 min  Hospital Problem: Principal Problem:   TBI (traumatic brain injury) [facility])   Past Medical History:  Past Medical History:  Diagnosis Date  . Abnormal liver function tests 12/15/2018  . Alcoholic liver disease ([place]) 12/15/2018  . Alcoholism ([place])   . Elevated LDL cholesterol level 05/22/2013  . Epistaxis   . Erectile dysfunction 05/22/2013  . Frequent falls 11/09/2018  . GERD (gastroesophageal reflux disease)   . Hyperammonemia ([place]) 11/15/2018  . Memory change 08/17/2017  . RA (rheumatoid arthritis) ([place]) 08/17/2017  . RBBB 12/15/2018  . Rheumatoid arthritis ([place]) 2010  . Shuffling gait 12/19/2018  . Tobacco abuse 04/26/2012  . Weight loss, unintentional 11/09/2018  . 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 12/15/2018   Past Surgical History:  Past Surgical History:  Procedure Laterality Date  . NASAL ENDOSCOPY WITH EPISTAXIS CONTROL N/A 04/12/2018   Procedure: NASAL ENDOSCOPY WITH EPISTAXIS CONTROL;  Surgeon: Melissa Montane, MD;  Location: [facility];  Service: ENT;  Laterality: N/A;  . VASECTOMY    . VASECTOMY REVERSAL      Assessment & Plan Clinical Impression: Patient is a 74 y.o.malewith history of HTN, RA, fatty liver, ETOH abuse,shuffling gait flexed gait as well as increase in falls for about a week with lastfallresultingwith LOC ~ 10 minutes on 07/09 and reports of headache. He was intoxicated at admission with ETOH level 190 and CT head done revealing bilateral mild to moderate SAH along bilateral tentorium and along right temporal and left frontal lobe as well as cute non-displaced fracture occipital region  extending inferiorly to level of foramen magnum. Dr. Vertell Limber recommended conservative management and follow up CT head stable. He was admitted for observation and developed ETOH withdrawal with DT's requiring CIWA protocol as well as Precedex. PCCM following for support and patient's respiratory status has been stable and was weaned off Precedex by 07/13. Therapy ongoing and patient continues to be limited by balance deficits with poor safety awareness, impulsivity and lethargy. Patient admitted to [facility] 11/10/19    Patient currently requires min-MOD with basic self-care skills secondary to muscle weakness, decreased cardiorespiratoy endurance, impaired timing and sequencing, decreased coordination and decreased motor planning, decreased attention to left, decreased attention, decreased awareness, decreased problem solving, decreased safety awareness and decreased memory, peripheral and decreased sitting balance, decreased standing balance, decreased postural control and decreased balance strategies.  Prior to hospitalization, patient could complete BADL/IADL with modified independent .  Patient will benefit from skilled intervention to decrease level of assist with basic self-care skills and increase independence with basic self-care skills prior to discharge home with care partner.  Anticipate patient will require 24 hour supervision and follow up home health.  OT - End of Session Activity Tolerance: Tolerates 30+ min activity with multiple rests Endurance Deficit: Yes Endurance Deficit Description: fatigues quickly in standing/ambuation tasks OT Assessment Rehab Potential (ACUTE ONLY): Good OT Barriers to Discharge: Decreased caregiver support;Lack of/limited family support OT Barriers to Discharge Comments: unsure if family can provide 24/7 supervision needed OT Patient demonstrates impairments in the following area(s): Balance;Behavior;Cognition;Edema;Endurance;Motor;Pain;Perception;Safety;Vision OT  Basic ADL's Functional Problem(s): Grooming;Bathing;Dressing;Toileting;Eating OT Transfers Functional Problem(s): Toilet;Tub/Shower OT Additional Impairment(s): None OT Plan OT Intensity: Minimum of 1-2 x/day, 45 to  90 minutes OT Frequency: 5 out of 7 days OT Duration/Estimated Length of Stay: 12-16 OT Treatment/Interventions: Balance/vestibular training;Discharge planning;Pain management;Self Care/advanced ADL retraining;UE/LE Coordination activities;Therapeutic Activities;Cognitive remediation/compensation;Disease mangement/prevention;Functional mobility training;Patient/family education;Skin care/wound managment;Therapeutic Exercise;Visual/perceptual remediation/compensation;Wheelchair propulsion/positioning;UE/LE Strength taining/ROM;Splinting/orthotics;Psychosocial support;Neuromuscular re-education;DME/adaptive equipment instruction;Community reintegration OT Self Feeding Anticipated Outcome(s): S OT Basic Self-Care Anticipated Outcome(s): S OT Toileting Anticipated Outcome(s): S OT Bathroom Transfers Anticipated Outcome(s): S OT Recommendation Patient destination: Home Follow Up Recommendations: Home health OT Equipment Recommended: 3 in 1 bedside comode;Tub/shower seat   Skilled Therapeutic Intervention 1;1. Pt received in bed with head covered and initially grumpy stating, "has my wife brought my clothing yet, let me call her." Pt unable to dial number and Ot completes task for him however unable to reach wife. Pt educated on role/purpose of OT, CIR, ELOS and POC. Pt reporting need to toilet and ambulates with MOD HHA demoing stooped posture, rigid trunk and reaching out to stabilize on furniture to stand over toilet for a continent void. Pt completes dressing EOB with S for shirt, MOD A for balance for standing to advance pants past hips and MIN A for socks (sitting balance). Pt grooms in stanidng at sink with anterior trunk flexion in stance and heavy reliance on leaning into sink to  hold pt up. Pt sits in w/c and wife arrives. Edu re DME needs and OT role/purpose. Pt completes furniture transfer to reclienr with MOD HHA and standard bed in ADL apartment with MOD HHA and S for bed mobility. Vertical nystagmus noted sit>supine and rolling R with pt (+) for dizziness. When amb back to w/c pt reaching out for w/c armrest but attempting to sit still 2 feet from w/c requring total A to guide hips into chair despite pt stating, "I got this." Upon return to room continued education to pt and wife that staff should be the only people to A pt to mobility. Wife very upset by last nights events that NTs were not responsive to call button and pt took himself to bathroom while urinating all over himself. OT provides urinal and encouraged wife to A pt with urinal use as needed when she is present. Also alerted charge RN to talk to wife. Exited session with tp eated in w/c, belt alarm on and call light in reach  OT Evaluation Precautions/Restrictions  Precautions Precautions: Fall Restrictions Weight Bearing Restrictions: No General Chart Reviewed: Yes Family/Caregiver Present: No Vital Signs  Pain Pain Assessment Pain Score: 0-No pain Home Living/Prior Functioning Home Living Family/patient expects to be discharged to:: Unsure Living Arrangements: Spouse/significant other Available Help at Discharge: Family, Available 24 hours/day Type of Home: House Home Access: Stairs to enter Technical brewer of Steps: 3 Entrance Stairs-Rails: Right Home Layout: One level Bathroom Shower/Tub: Multimedia programmer: Standard Additional Comments: walking stick; states has a chair he can put into the shower  Lives With: Spouse Prior Function Comments: reports multiple falls at home, uses walking stick outside, enjoys yardwork, Daughter present today reports her mom can't get him up when he falls, pt reports he gets up on his own ADL   Vision Baseline Vision/History: Wears  glasses Wears Glasses: Reading only Patient Visual Report: Blurring of vision Vision Assessment?: Yes Eye Alignment: Within Functional Limits Ocular Range of Motion: Within Functional Limits Alignment/Gaze Preference: Within Defined Limits Tracking/Visual Pursuits: Decreased smoothness of vertical tracking Saccades: Within functional limits Convergence: Within functional limits Perception  Perception: Impaired Comments: L inattention Praxis Praxis: Impaired Praxis Impairment Details: Motor planning Cognition Overall Cognitive Status:  Impaired/Different from baseline Arousal/Alertness: Awake/alert Orientation Level: Person;Situation Person: Oriented Place: Disoriented Situation: Oriented Year: 2021 Month: July Day of Week: Correct Immediate Memory Recall:  (unable) Memory Recall Sock: Not able to recall Memory Recall Blue: Not able to recall Memory Recall Bed: Not able to recall Sustained Attention: Impaired Awareness: Impaired Problem Solving: Impaired Safety/Judgment: Impaired [place] [employer] Scales of Cognitive Functioning: Confused/appropriate Sensation Sensation Light Touch: Appears Intact Proprioception: Impaired by gross assessment Coordination Gross Motor Movements are Fluid and Coordinated: Yes Fine Motor Movements are Fluid and Coordinated: No Motor  Motor Motor: Ataxia;Abnormal postural alignment and control Mobility  Bed Mobility Bed Mobility: Rolling Right;Rolling Left;Right Sidelying to Sit Rolling Right: Supervision/verbal cueing Rolling Left: Supervision/Verbal cueing Right Sidelying to Sit: Minimal Assistance - Patient > 75% (vertical nystagmus) Transfers Sit to Stand: Moderate Assistance - Patient 50-74% Stand to Sit: Moderate Assistance - Patient 50-74%  Trunk/Postural Assessment  Cervical Assessment Cervical Assessment:  (head forward) Thoracic Assessment Thoracic Assessment:  (rounded shoulders) Lumbar Assessment Lumbar Assessment:   (post pelvic tilt) Postural Control Postural Control: Deficits on evaluation Trunk Control: poor Righting Reactions: delayed Protective Responses: delayed  Balance Balance Balance Assessed: Yes Dynamic Sitting Balance Dynamic Sitting - Level of Assistance: 4: Min assist Static Standing Balance Static Standing - Level of Assistance: 4: Min assist Static Standing - Comment/# of Minutes: anterior lean towards sink Dynamic Standing Balance Dynamic Standing - Level of Assistance: 3: Mod assist Extremity/Trunk Assessment RUE Assessment RUE Assessment: Exceptions to [facility] General Strength Comments: generalized weakness LUE Assessment LUE Assessment: Exceptions to [facility] General Strength Comments: generalized weakness     Refer to Care Plan for Long Term Goals  Recommendations for other services: None    Discharge Criteria: Patient will be discharged from OT if patient refuses treatment 3 consecutive times without medical reason, if treatment goals not met, if there is a change in medical status, if patient makes no progress towards goals or if patient is discharged from hospital.  The above assessment, treatment plan, treatment alternatives and goals were discussed and mutually agreed upon: by patient  Tonny Branch 11/11/2019, 12:34 PM

## 2019-11-12 NOTE — Progress Notes (Signed)
[facility] PHYSICAL MEDICINE & REHABILITATION PROGRESS NOTE   Subjective/Complaints: Had a fair night. Was awake intermittently  ROS: Limited due to cognitive/behavioral   Objective:   DG Swallowing Func-Speech Pathology  Result Date: 11/10/2019 Objective Swallowing Evaluation: Type of Study: MBS-Modified Barium Swallow Study  Patient Details Name: ERINN HUSKINS MRN: [id number] Date of Birth: Oct 04, 1945 Today's Date: 11/10/2019 Time: SLP Start Time (ACUTE ONLY): 1137 -SLP Stop Time (ACUTE ONLY): 1205 SLP Time Calculation (min) (ACUTE ONLY): 28 min Past Medical History: Past Medical History: Diagnosis Date . Abnormal liver function tests 12/15/2018 . Alcoholic liver disease ([place]) 12/15/2018 . Alcoholism ([place])  . Elevated LDL cholesterol level 05/22/2013 . Epistaxis  . Erectile dysfunction 05/22/2013 . Frequent falls 11/09/2018 . GERD (gastroesophageal reflux disease)  . Hyperammonemia ([place]) 11/15/2018 . Memory change 08/17/2017 . RA (rheumatoid arthritis) ([place]) 08/17/2017 . RBBB 12/15/2018 . Rheumatoid arthritis ([place]) 2010 . Shuffling gait 12/19/2018 . Tobacco abuse 04/26/2012 . Weight loss, unintentional 11/09/2018 . 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 NASAL ENDOSCOPY WITH EPISTAXIS CONTROL N/A 04/12/2018  Procedure: NASAL ENDOSCOPY WITH EPISTAXIS CONTROL;  Surgeon: Melissa Montane, MD;  Location: [facility];  Service: ENT;  Laterality: N/A; . VASECTOMY   . VASECTOMY REVERSAL   HPI: Patient is a 74 year old white male with a history of alcoholism, rheumatoid arthritis, alcoholic liver disease, tobacco use, previous CVA, GERD.  Patient was at home doing some work outside when he fell and hit the back of his head on the concrete driveway with positive LOC.  Head CT positive for subarachnoid hemorrhage and basilar skull fracture.  Subjective: alert, cooperative, says his throat hurts Assessment / Plan / Recommendation CHL IP CLINICAL  IMPRESSIONS 11/10/2019 Clinical Impression Pt has a moderate oropharyngeal dysphagia secondary to impaired timing, coordination, and strength. His oral preparation is disorganized, not collecting liquids into well formed boluses. There is sublingual and posterior spilling with mild oral residue. He also has reduced hyolaryngeal movement, epiglottic inversion, base of tongue retraction, and laryngeal vestibule closure, so when nectar thick liquids reach beyond his epiglottis before the swallow, he silently aspirates. He also aspirates after the swallow on moderate amounts of residue left from thickened liquids and soft solids. A chin tuck does not significantly change function, but he has improved bolus cohesion, timing, and pharyngeal efficiency when cued to "swallow hard" with purees and nectar thick liquids via spoon. Recommend downgrading diet to purees and continuing nectar thick liquids by spoon only. Full supervision should be provided to provide cues to swallow hard throughout meals to further increase safety. Daughter, [name], was also present and educated on results.  SLP Visit Diagnosis Dysphagia, oropharyngeal phase (R13.12) Attention and concentration deficit following -- Frontal lobe and executive function deficit following -- Impact on safety and function Moderate aspiration risk   CHL IP TREATMENT RECOMMENDATION 11/10/2019 Treatment Recommendations Therapy as outlined in treatment plan below   Prognosis 11/10/2019 Prognosis for Safe Diet Advancement Good Barriers to Reach Goals Cognitive deficits Barriers/Prognosis Comment -- CHL IP DIET RECOMMENDATION 11/10/2019 SLP Diet Recommendations Dysphagia 1 (Puree) solids;Nectar thick liquid Liquid Administration via Spoon Medication Administration Crushed with puree Compensations Slow rate;Small sips/bites;Minimize environmental distractions;Effortful swallow Postural Changes Seated upright at 90 degrees   CHL IP OTHER RECOMMENDATIONS 11/10/2019 Recommended  Consults -- Oral Care Recommendations Oral care QID Other Recommendations Order thickener from pharmacy;Prohibited food (jello, ice cream, thin soups);Remove water pitcher   CHL IP FOLLOW UP RECOMMENDATIONS 11/10/2019 Follow  up Recommendations Inpatient Rehab   CHL IP FREQUENCY AND DURATION 11/10/2019 Speech Therapy Frequency (ACUTE ONLY) min 2x/week Treatment Duration 2 weeks      CHL IP ORAL PHASE 11/10/2019 Oral Phase Impaired Oral - Pudding Teaspoon -- Oral - Pudding Cup -- Oral - Honey Teaspoon -- Oral - Honey Cup Reduced posterior propulsion;Delayed oral transit;Decreased bolus cohesion;Premature spillage;Lingual/palatal residue Oral - Nectar Teaspoon Reduced posterior propulsion;Delayed oral transit;Lingual/palatal residue Oral - Nectar Cup Reduced posterior propulsion;Delayed oral transit;Decreased bolus cohesion;Premature spillage;Lingual/palatal residue Oral - Nectar Straw -- Oral - Thin Teaspoon -- Oral - Thin Cup -- Oral - Thin Straw -- Oral - Puree Reduced posterior propulsion;Delayed oral transit;Decreased bolus cohesion;Lingual/palatal residue Oral - Mech Soft Reduced posterior propulsion;Delayed oral transit;Decreased bolus cohesion;Lingual/palatal residue;Impaired mastication Oral - Regular -- Oral - Multi-Consistency -- Oral - Pill -- Oral Phase - Comment --  CHL IP PHARYNGEAL PHASE 11/10/2019 Pharyngeal Phase Impaired Pharyngeal- Pudding Teaspoon -- Pharyngeal -- Pharyngeal- Pudding Cup -- Pharyngeal -- Pharyngeal- Honey Teaspoon -- Pharyngeal -- Pharyngeal- Honey Cup Reduced epiglottic inversion;Reduced anterior laryngeal mobility;Reduced laryngeal elevation;Reduced airway/laryngeal closure;Reduced tongue base retraction;Pharyngeal residue - valleculae;Pharyngeal residue - pyriform;Delayed swallow initiation-vallecula;Penetration/Apiration after swallow Pharyngeal Material enters airway, passes BELOW cords without attempt by patient to eject out (silent aspiration) Pharyngeal- Nectar Teaspoon Reduced  epiglottic inversion;Reduced anterior laryngeal mobility;Reduced laryngeal elevation;Reduced airway/laryngeal closure;Reduced tongue base retraction;Compensatory strategies attempted (with notebox);Pharyngeal residue - valleculae Pharyngeal -- Pharyngeal- Nectar Cup Reduced epiglottic inversion;Reduced anterior laryngeal mobility;Reduced laryngeal elevation;Reduced airway/laryngeal closure;Reduced tongue base retraction;Pharyngeal residue - valleculae;Pharyngeal residue - pyriform;Delayed swallow initiation-vallecula;Delayed swallow initiation-pyriform sinuses;Penetration/Aspiration during swallow;Penetration/Apiration after swallow Pharyngeal Material enters airway, passes BELOW cords without attempt by patient to eject out (silent aspiration) Pharyngeal- Nectar Straw -- Pharyngeal -- Pharyngeal- Thin Teaspoon -- Pharyngeal -- Pharyngeal- Thin Cup -- Pharyngeal -- Pharyngeal- Thin Straw -- Pharyngeal -- Pharyngeal- Puree Reduced epiglottic inversion;Reduced anterior laryngeal mobility;Reduced laryngeal elevation;Reduced airway/laryngeal closure;Reduced tongue base retraction;Pharyngeal residue - valleculae;Pharyngeal residue - pyriform;Delayed swallow initiation-vallecula;Compensatory strategies attempted (with notebox) Pharyngeal -- Pharyngeal- Mechanical Soft Reduced epiglottic inversion;Reduced anterior laryngeal mobility;Reduced laryngeal elevation;Reduced airway/laryngeal closure;Reduced tongue base retraction;Pharyngeal residue - valleculae;Pharyngeal residue - pyriform;Penetration/Apiration after swallow;Penetration/Aspiration during swallow Pharyngeal Material enters airway, passes BELOW cords without attempt by patient to eject out (silent aspiration) Pharyngeal- Regular -- Pharyngeal -- Pharyngeal- Multi-consistency -- Pharyngeal -- Pharyngeal- Pill -- Pharyngeal -- Pharyngeal Comment --  CHL IP CERVICAL ESOPHAGEAL PHASE 11/10/2019 Cervical Esophageal Phase Impaired Pudding Teaspoon -- Pudding Cup -- Honey  Teaspoon -- Honey Cup Reduced cricopharyngeal relaxation Nectar Teaspoon Reduced cricopharyngeal relaxation Nectar Cup Reduced cricopharyngeal relaxation Nectar Straw -- Thin Teaspoon -- Thin Cup -- Thin Straw -- Puree Reduced cricopharyngeal relaxation Mechanical Soft Reduced cricopharyngeal relaxation Regular -- Multi-consistency -- Pill -- Cervical Esophageal Comment -- Osie Bond., M.A. CCC-SLP Acute Rehabilitation Services Pager [phone] Office [phone] 11/10/2019, 2:49 PM              Recent Labs    11/10/19 0352  WBC 9.6  HGB 13.7  HCT 40.8  PLT 169   Recent Labs    11/10/19 0352  NA 134*  K 4.3  CL 103  CO2 23  GLUCOSE 102*  BUN 12  CREATININE 0.78  CALCIUM 8.7*    Intake/Output Summary (Last 24 hours) at 11/12/2019 1048 Last data filed at 11/12/2019 0900 Gross per 24 hour  Intake 80 ml  Output --  Net 80 ml     Physical Exam: Vital Signs Blood pressure 128/64, pulse 60, temperature (!) 97.5 F (36.4 C),  temperature source Axillary, resp. rate 18, height 5\' 9"  (1.753 m), weight 72.2 kg, SpO2 100 %.  Constitutional: No distress . Vital signs reviewed. HEENT: EOMI, oral membranes moist Neck: supple Cardiovascular: RRR without murmur. No JVD    Respiratory/Chest: CTA Bilaterally without wheezes or rales. Normal effort    GI/Abdomen: BS +, non-tender, non-distended Ext: no clubbing, cyanosis, or edema Psych: flat, distracted Skin: Clean and intact without signs of breakdown Neuro: alert, speech dysarthric. Follows basic commands. Oriented to person not place. Moves all 4's. Senses pain in all 4's.  Musculoskeletal: Full ROM, No pain with AROM or PROM in the neck, trunk, or extremities. Posture appropriate      Assessment/Plan: 1. Functional deficits secondary to TBI which require 3+ hours per day of interdisciplinary therapy in a comprehensive inpatient rehab setting.  Physiatrist is providing close team supervision and 24 hour management of active medical  problems listed below.  Physiatrist and rehab team continue to assess barriers to discharge/monitor patient progress toward functional and medical goals  Care Tool:  Bathing  Bathing activity did not occur: Refused           Bathing assist       Upper Body Dressing/Undressing Upper body dressing   What is the patient wearing?: Pull over shirt    Upper body assist Assist Level: Supervision/Verbal cueing    Lower Body Dressing/Undressing Lower body dressing      What is the patient wearing?: Pants, Incontinence brief     Lower body assist Assist for lower body dressing: Moderate Assistance - Patient 50 - 74% (standing balance)     Toileting Toileting    Toileting assist Assist for toileting: Moderate Assistance - Patient 50 - 74%     Transfers Chair/bed transfer  Transfers assist     Chair/bed transfer assist level: Minimal Assistance - Patient > 75%     Locomotion Ambulation   Ambulation assist      Assist level: Moderate Assistance - Patient 50 - 74% Assistive device: No Device Max distance: 266ft   Walk 10 feet activity   Assist     Assist level: Moderate Assistance - Patient - 50 - 74% Assistive device: No Device   Walk 50 feet activity   Assist    Assist level: Moderate Assistance - Patient - 50 - 74% Assistive device: No Device    Walk 150 feet activity   Assist    Assist level: Moderate Assistance - Patient - 50 - 74% Assistive device: No Device    Walk 10 feet on uneven surface  activity   Assist     Assist level: Minimal Assistance - Patient > 75% Assistive device: Other (comment) (railing on ramp)   Wheelchair     Assist Will patient use wheelchair at discharge?: No             Wheelchair 50 feet with 2 turns activity    Assist            Wheelchair 150 feet activity     Assist          Blood pressure 128/64, pulse 60, temperature (!) 97.5 F (36.4 C), temperature source  Axillary, resp. rate 18, height 5\' 9"  (1.753 m), weight 72.2 kg, SpO2 100 %.  Medical Problem List and Plan: 1.  Balance deficits, flexed posture, shuffling gait, cognitive deficits, impairments in attention, dysphagia secondary to TBI with bilateral SAH.             -patient may  not shower             -ELOS/Goals: 7-10 days/supervision             -Patient continues CIR therapies  including PT, OT, and SLP  2.  Antithrombotics: -DVT/anticoagulation:  Mechanical: Sequential compression devices, below knee Bilateral lower extremities             -antiplatelet therapy: N/A 3. Persistent Headaches/Pain Management: Getting a little better --continue Oxycodone prn.               -plan to transition to nonnarcotics given history. 4. Mood: LCSW to follow for evaluation and support.              -antipsychotic agents: N/A  -continue sleep chart 5. Neuropsych: This patient is not capable of making decisions on his own behalf. 6. Skin/Wound Care: Routine Routine pressure relief measures.  7. Fluids/Electrolytes/Nutrition: Monitor I/O.  CMP ordered. 8. Alcohol abuse with DTs: resume thiamine and folic acid.   -clonidine taper--BP stable.               Continue to monitor closely. 9.  Dysphagia: Diet modified to dysphagia 1, thin liquids on 07/16.             Advance diet as tolerated per SLP 10. H/o RA: Managed with Meloxicam-- was resumed on 07/15. Monitor for GI irritation or signs of bleeding.  11. Hyponatremia/Hypomagnesemia: Likely due to ETOH abuse.              sodium 134 7/16 12. Hepatic steatosis/Chronic pancreatitis: Continue Thiamine and Folic acid.              LFT's nl 7/16    LOS: 2 days A FACE TO FACE EVALUATION WAS PERFORMED  Meredith Staggers 11/12/2019, 10:48 AM

## 2019-11-12 NOTE — Plan of Care (Signed)
  Problem: RH BLADDER ELIMINATION Goal: RH STG MANAGE BLADDER WITH ASSISTANCE Description: STG Manage Bladder With moderate assistance Assistance Outcome: Progressing Goal: RH STG MANAGE BLADDER WITH MEDICATION WITH ASSISTANCE Description: STG Manage Bladder With Medication With moderate Assistance. Outcome: Progressing Goal: RH STG MANAGE BLADDER WITH EQUIPMENT WITH ASSISTANCE Description: STG Manage Bladder With Equipment With moderate Assistance. Outcome: Progressing   Problem: RH SKIN INTEGRITY Goal: RH STG SKIN FREE OF INFECTION/BREAKDOWN Outcome: Progressing Goal: RH STG MAINTAIN SKIN INTEGRITY WITH ASSISTANCE Description: STG Maintain Skin Integrity With moderate Assistance. Outcome: Progressing Goal: RH STG ABLE TO PERFORM INCISION/WOUND CARE W/ASSISTANCE Description: STG Able To Perform Incision/Wound Care With maximum Assistance. Outcome: Progressing   Problem: RH SAFETY Goal: RH STG ADHERE TO SAFETY PRECAUTIONS W/ASSISTANCE/DEVICE Description: STG Adhere to Safety Precautions With Assistance/Device. Outcome: Progressing Goal: RH STG DECREASED RISK OF FALL WITH ASSISTANCE Description: STG Decreased Risk of Fall With Assistance. Outcome: Progressing   Problem: RH PAIN MANAGEMENT Goal: RH STG PAIN MANAGED AT OR BELOW PT'S PAIN GOAL Outcome: Progressing   Problem: RH KNOWLEDGE DEFICIT GENERAL Goal: RH STG INCREASE KNOWLEDGE OF SELF CARE AFTER HOSPITALIZATION Outcome: Progressing   Problem: Consults Goal: RH BRAIN INJURY PATIENT EDUCATION Description: Description: See Patient Education module for eduction specifics Outcome: Progressing Goal: Skin Care Protocol Initiated - if Braden Score 18 or less Description: If consults are not indicated, leave blank or document N/A Outcome: Progressing Goal: Nutrition Consult-if indicated Outcome: Progressing Goal: Diabetes Guidelines if Diabetic/Glucose > 140 Description: If diabetic or lab glucose is > 140 mg/dl - Initiate  Diabetes/Hyperglycemia Guidelines & Document Interventions  Outcome: Progressing   Problem: RH BOWEL ELIMINATION Goal: RH STG MANAGE BOWEL WITH ASSISTANCE Description: STG Manage Bowel with Assistance. Outcome: Progressing Goal: RH STG MANAGE BOWEL W/MEDICATION W/ASSISTANCE Description: STG Manage Bowel with Medication with Assistance. Outcome: Progressing   Problem: RH BLADDER ELIMINATION Goal: RH STG MANAGE BLADDER WITH ASSISTANCE Description: STG Manage Bladder With Assistance Outcome: Progressing Goal: RH STG MANAGE BLADDER WITH MEDICATION WITH ASSISTANCE Description: STG Manage Bladder With Medication With Assistance. Outcome: Progressing Goal: RH STG MANAGE BLADDER WITH EQUIPMENT WITH ASSISTANCE Description: STG Manage Bladder With Equipment With Assistance Outcome: Progressing   Problem: RH SKIN INTEGRITY Goal: RH STG SKIN FREE OF INFECTION/BREAKDOWN Description: With moderate assistance. Outcome: Progressing   Problem: RH SAFETY Goal: RH STG ADHERE TO SAFETY PRECAUTIONS W/ASSISTANCE/DEVICE Description: STG Adhere to Safety Precautions With Assistance/Device. Outcome: Progressing

## 2019-11-13 ENCOUNTER — Inpatient Hospital Stay (HOSPITAL_COMMUNITY): Payer: 59 | Admitting: Speech Pathology

## 2019-11-13 ENCOUNTER — Inpatient Hospital Stay (HOSPITAL_COMMUNITY): Payer: 59 | Admitting: Physical Therapy

## 2019-11-13 ENCOUNTER — Inpatient Hospital Stay (HOSPITAL_COMMUNITY): Payer: 59

## 2019-11-13 ENCOUNTER — Inpatient Hospital Stay (HOSPITAL_COMMUNITY): Payer: 59 | Admitting: Occupational Therapy

## 2019-11-13 LAB — CBC WITH DIFFERENTIAL/PLATELET
Abs Immature Granulocytes: 0.04 10*3/uL (ref 0.00–0.07)
Basophils Absolute: 0.1 10*3/uL (ref 0.0–0.1)
Basophils Relative: 1 %
Eosinophils Absolute: 0.1 10*3/uL (ref 0.0–0.5)
Eosinophils Relative: 2 %
HCT: 42.7 % (ref 39.0–52.0)
Hemoglobin: 14.4 g/dL (ref 13.0–17.0)
Immature Granulocytes: 1 %
Lymphocytes Relative: 20 %
Lymphs Abs: 1.7 10*3/uL (ref 0.7–4.0)
MCH: 33 pg (ref 26.0–34.0)
MCHC: 33.7 g/dL (ref 30.0–36.0)
MCV: 97.9 fL (ref 80.0–100.0)
Monocytes Absolute: 0.8 10*3/uL (ref 0.1–1.0)
Monocytes Relative: 10 %
Neutro Abs: 5.7 10*3/uL (ref 1.7–7.7)
Neutrophils Relative %: 66 %
Platelets: 270 10*3/uL (ref 150–400)
RBC: 4.36 MIL/uL (ref 4.22–5.81)
RDW: 11.9 % (ref 11.5–15.5)
WBC: 8.4 10*3/uL (ref 4.0–10.5)
nRBC: 0 % (ref 0.0–0.2)

## 2019-11-13 LAB — COMPREHENSIVE METABOLIC PANEL
ALT: 33 U/L (ref 0–44)
AST: 29 U/L (ref 15–41)
Albumin: 3 g/dL — ABNORMAL LOW (ref 3.5–5.0)
Alkaline Phosphatase: 76 U/L (ref 38–126)
Anion gap: 10 (ref 5–15)
BUN: 16 mg/dL (ref 8–23)
CO2: 24 mmol/L (ref 22–32)
Calcium: 9.4 mg/dL (ref 8.9–10.3)
Chloride: 100 mmol/L (ref 98–111)
Creatinine, Ser: 0.86 mg/dL (ref 0.61–1.24)
GFR calc Af Amer: >60 mL/min (ref >60)
GFR calc non Af Amer: >60 mL/min (ref >60)
Glucose, Bld: 111 mg/dL — ABNORMAL HIGH (ref 70–99)
Potassium: 4.3 mmol/L (ref 3.5–5.1)
Sodium: 134 mmol/L — ABNORMAL LOW (ref 135–145)
Total Bilirubin: 0.8 mg/dL (ref 0.3–1.2)
Total Protein: 7.8 g/dL (ref 6.5–8.1)

## 2019-11-13 MED ORDER — SODIUM CHLORIDE 0.45 % IV SOLN: INTRAVENOUS | Status: DC

## 2019-11-13 MED ORDER — PHENOL 1.4 % MT LIQD
1.0000 | OROMUCOSAL | Status: DC | PRN
  Filled 2019-11-13: qty 177

## 2019-11-13 MED ORDER — TOPIRAMATE 25 MG PO TABS
25.0000 mg | ORAL_TABLET | Freq: Two times a day (BID) | ORAL | Status: DC
  Administered 2019-11-13 – 2019-11-16 (×6): 25 mg via ORAL
  Filled 2019-11-13 (×6): qty 1

## 2019-11-13 MED ORDER — PREDNISONE 1 MG PO TABS
1.0000 mg | ORAL_TABLET | Freq: Every day | ORAL | Status: DC
  Administered 2019-11-14 – 2019-11-23 (×10): 1 mg via ORAL
  Filled 2019-11-13 (×10): qty 1

## 2019-11-13 MED ORDER — BIOTENE DRY MOUTH MT LIQD: 15.0000 mL | OROMUCOSAL | Status: DC | PRN

## 2019-11-13 MED ORDER — MAGIC MOUTHWASH
5.0000 mL | Freq: Three times a day (TID) | ORAL | Status: DC
  Administered 2019-11-13 – 2019-11-14 (×3): 5 mL via ORAL
  Filled 2019-11-13 (×4): qty 5

## 2019-11-13 NOTE — Progress Notes (Signed)
Inpatient Rehabilitation  Patient information reviewed and entered into eRehab system by Darbi Chandran M. Dhruvi Crenshaw, M.A., CCC/SLP, PPS Coordinator.  Information including medical coding, functional ability and quality indicators will be reviewed and updated through discharge.    

## 2019-11-13 NOTE — Care Management (Signed)
[facility] Individual Statement of Services  Patient Name:  SELMER ADDUCI  Date:  11/13/2019  Welcome to the [facility].  Our goal is to provide you with an individualized program based on your diagnosis and situation, designed to meet your specific needs.  With this comprehensive rehabilitation program, you will be expected to participate in at least 3 hours of rehabilitation therapies Monday-Friday, with modified therapy programming on the weekends.  Your rehabilitation program will include the following services:  Physical Therapy (PT), Occupational Therapy (OT), Speech Therapy (ST), 24 hour per day rehabilitation nursing, Therapeutic Recreaction (TR), Psychology, Neuropsychology, Care Coordinator, Rehabilitation Medicine, Nutrition Services, Pharmacy Services and Other  Weekly team conferences will be held on Tuesdays to discuss your progress.  Your Inpatient Rehabilitation Care Coordinator will talk with you frequently to get your input and to update you on team discussions.  Team conferences with you and your family in attendance may also be held.  Expected length of stay: 10-16 days    Overall anticipated outcome: Supervision  Depending on your progress and recovery, your program may change. Your Inpatient Rehabilitation Care Coordinator will coordinate services and will keep you informed of any changes. Your Inpatient Rehabilitation Care Coordinator's name and contact numbers are listed  below.  The following services may also be recommended but are not provided by the [facility] will be made to provide these services after discharge if needed.  Arrangements include referral to agencies that provide these services.  Your insurance has been verified to be:  [facility]  Your primary  doctor is:  No PCP   Pertinent information will be shared with your doctor and your insurance company.  Inpatient Rehabilitation Care Coordinator:  Cathleen Corti [phone] or [phone]  Information discussed with and copy given to patient by: Rana Snare, 11/13/2019, 10:12 AM

## 2019-11-13 NOTE — IPOC Note (Signed)
Overall Plan of Care [facility]) Patient Details Name: William Gibson MRN: [id number] DOB: 1945/07/24  Admitting Diagnosis: TBI (traumatic brain injury) [facility])  Hospital Problems: Principal Problem:   TBI (traumatic brain injury) ([place])     Functional Problem List: Nursing    PT Balance, Perception, Behavior, Safety, Edema, Sensory, Endurance, Skin Integrity, Motor, Nutrition, Pain  OT Balance, Behavior, Cognition, Edema, Endurance, Motor, Pain, Perception, Safety, Vision  SLP Behavior, Cognition, Nutrition  TR         Basic ADL's: OT Grooming, Bathing, Dressing, Toileting, Eating     Advanced  ADL's: OT       Transfers: PT Bed Mobility, Bed to Chair, Car, Furniture, Floor  OT Toilet, Tub/Shower     Locomotion: PT Ambulation, Stairs     Additional Impairments: OT None  SLP Swallowing, Communication, Social Cognition expression Social Interaction, Problem Solving, Memory, Attention, Awareness  TR      Anticipated Outcomes Item Anticipated Outcome  Self Feeding S  Swallowing  Supervision   Basic self-care  S  Toileting  S   Bathroom Transfers S  Bowel/Bladder     Transfers  supervision using LRAD  Locomotion  supervision using LRAD  Communication  Supervision  Cognition  Min A  Pain     Safety/Judgment      Therapy Plan: PT Intensity: Minimum of 1-2 x/day ,45 to 90 minutes PT Frequency: 5 out of 7 days PT Duration Estimated Length of Stay: 10-14 days OT Intensity: Minimum of 1-2 x/day, 45 to 90 minutes OT Frequency: 5 out of 7 days OT Duration/Estimated Length of Stay: 12-16 SLP Intensity: Minumum of 1-2 x/day, 30 to 90 minutes SLP Frequency: 3 to 5 out of 7 days SLP Duration/Estimated Length of Stay: 10-14 days   Due to the current state of emergency, patients may not be receiving their 3-hours of Medicare-mandated therapy.   Team Interventions: Nursing Interventions    PT interventions Ambulation/gait training, Community reintegration,  DME/adaptive equipment instruction, Neuromuscular re-education, Psychosocial support, Stair training, UE/LE Strength taining/ROM, Training and development officer, Discharge planning, Functional electrical stimulation, Pain management, Skin care/wound management, Therapeutic Activities, UE/LE Coordination activities, Cognitive remediation/compensation, Disease management/prevention, Functional mobility training, Patient/family education, Splinting/orthotics, Therapeutic Exercise, Visual/perceptual remediation/compensation  OT Interventions Balance/vestibular training, Discharge planning, Pain management, Self Care/advanced ADL retraining, UE/LE Coordination activities, Therapeutic Activities, Cognitive remediation/compensation, Disease mangement/prevention, Functional mobility training, Patient/family education, Skin care/wound managment, Therapeutic Exercise, Visual/perceptual remediation/compensation, Wheelchair propulsion/positioning, UE/LE Strength taining/ROM, Splinting/orthotics, Psychosocial support, Neuromuscular re-education, DME/adaptive equipment instruction, Community reintegration  SLP Interventions Cognitive remediation/compensation, Dysphagia/aspiration precaution training, Internal/external aids, Speech/Language facilitation, Therapeutic Activities, Environmental controls, Cueing hierarchy, Functional tasks, Patient/family education  TR Interventions    SW/CM Interventions     Barriers to Discharge MD  Medical stability  Nursing      PT Home environment access/layout, Behavior    OT Decreased caregiver support, Lack of/limited family support unsure if family can provide 24/7 supervision needed  SLP      SW       Team Discharge Planning: Destination: PT-Home ,OT- Home , SLP-Home Projected Follow-up: PT-24 hour supervision/assistance, Outpatient PT, OT-  Home health OT, SLP-Home Health SLP, Outpatient SLP, 24 hour supervision/assistance Projected Equipment Needs: PT-To be determined,  OT- 3 in 1 bedside comode, Tub/shower seat, SLP-To be determined Equipment Details: PT- , OT-  Patient/family involved in discharge planning: PT- Patient, Family member/caregiver,  OT-Family member/caregiver, SLP-Patient  MD ELOS: 10-14d Medical Rehab Prognosis:  Good Assessment:  74 year old male with history of RA,HTN, fatty  liver due to ETOH abuse, gait disorder with shuffling flexed posture and increased in falls X a week who was admitted on 11/03/2019 after a fall in his driveway, where he struck his head.  History taken from chart review and daughter due to cognition.  Approximately 10 minutes LOC.  Thereafter, he reported headaches.  He was intoxicated on admission with a EtOH level of 190.  Imaging revealed bilateral mild to moderate SAH along bilateral tentorium and along right temporal and left frontal lobe as well as acute nondisplaced fracture of occipital region extending inferiorly to level of foramen magnum.  Dr. Vertell Limber was consulted for input and recommended conservative management.  Follow-up CT head was stable.  He was admitted for observation and developed EtOH withdrawal with DTs requiring CIWA protocol as well as Precedex.    PCCM has been following for support and patient's respiratory status has been stable. He was weaned off Precedex, tapered off Librium and required ativan due to hallucinations. He did develop somnolence post ativan dose. He was  placed on clonidine taper to complete detox due to ongoing issues with hallucinations and confusion.  His lethargy has resolved with improvement in mental status.  He continues to have issues with balance deficits with flexed posture and shuffling gait as well as decrease in right foot clearance, cognitive deficits with impairments in attention, moderate dysarthria with dysphagia.  Hospital course further complicated by dysphagia.  MBS done today and diet modified to dysphagia 1, thin liquids.  Therapy has been ongoing and CIR was recommended  due to functional decline.  Please see preadmission assessment from earlier today as well.   Now requiring 24/7 Rehab RN,MD, as well as CIR level PT, OT and SLP.  Treatment team will focus on ADLs and mobility with goals set atSee Team Conference Notes for weekly updates to the plan of care

## 2019-11-13 NOTE — Progress Notes (Signed)
Inpatient Rehabilitation Care Coordinator Assessment and Plan  Patient Details  Name: William Gibson MRN: [id number] Date of Birth: 11/08/1945  Today's Date: 11/13/2019  Problem List:  Patient Active Problem List   Diagnosis Date Noted  . TBI (traumatic brain injury) ([place])   . Alcohol-induced chronic pancreatitis ([place])   . Dysphagia   . Fall   . Hypomagnesemia   . Hyponatremia   . Delirium tremens ([place])   . Subarachnoid hemorrhage ([place]) 11/04/2019  . Skull fracture ([place]) 11/04/2019  . Head injury 11/04/2019  . Fall at home, initial encounter 11/04/2019  . Loss of consciousness ([place]) 11/04/2019  . Hyperlipidemia 02/06/2019  . Shuffling gait 12/19/2018  . Abnormal liver function tests 12/15/2018  . Alcoholic liver disease ([place]) 12/15/2018  . RBBB 12/15/2018  . 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 12/15/2018  . Hyperammonemia ([place]) 11/15/2018  . Weight loss, unintentional 11/09/2018  . Frequent falls 11/09/2018  . RA (rheumatoid arthritis) ([place]) 08/17/2017  . Memory change 08/17/2017  . Alcoholism ([place]) 12/15/2016  . Osteoarthritis 01/30/2016  . GERD (gastroesophageal reflux disease) 05/23/2014  . Elevated LDL cholesterol level 05/22/2013  . Erectile dysfunction 05/22/2013  . Tobacco abuse 04/26/2012   Past Medical History:  Past Medical History:  Diagnosis Date  . Abnormal liver function tests 12/15/2018  . Alcoholic liver disease ([place]) 12/15/2018  . Alcoholism ([place])   . Elevated LDL cholesterol level 05/22/2013  . Epistaxis   . Erectile dysfunction 05/22/2013  . Frequent falls 11/09/2018  . GERD (gastroesophageal reflux disease)   . Hyperammonemia ([place]) 11/15/2018  . Memory change 08/17/2017  . RA (rheumatoid arthritis) ([place]) 08/17/2017  . RBBB 12/15/2018  . Rheumatoid arthritis ([place]) 2010  . Shuffling gait 12/19/2018  . Tobacco abuse 04/26/2012  . Weight loss, unintentional 11/09/2018  . 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 NASAL ENDOSCOPY WITH EPISTAXIS CONTROL N/A 04/12/2018   Procedure: NASAL ENDOSCOPY WITH EPISTAXIS CONTROL;  Surgeon: Melissa Montane, MD;  Location: [facility]S;  Service: ENT;  Laterality: N/A;  . VASECTOMY    . VASECTOMY REVERSAL     Social History:  reports that he has been smoking cigarettes. He has a 87.00 pack-year smoking history. He has never used smokeless tobacco. He reports current alcohol use. He reports that he does not use drugs.  Family / Support Systems Marital Status: Married How Long?: 40 years Patient Roles: Spouse, Parent Spouse/Significant Other: William Gibson (wife): [phone] Children: 2 adult dtrs Other Supports: Daughters, and various family members Anticipated Caregiver: Wife and daughters Ability/Limitations of Caregiver: None reported Caregiver Availability: 24/7 Family Dynamics: pt lives alone with his wife. Pt often manages landscaping needs/garden.  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 Education: high school Read: Yes Write: Yes Employment Status: Retired Date Retired/Disabled/Unemployed: 2017 Age Retired: 35 Public relations account executive Issues: Denies Guardian/Conservator: N/A   Abuse/Neglect Abuse/Neglect Assessment Can Be Completed: Yes Physical Abuse: Denies Verbal Abuse: Denies Sexual Abuse: Denies Exploitation of patient/patient's resources: Denies Self-Neglect: Denies  Emotional Status Pt's affect, behavior and adjustment status: Pt in good spirits during visit. Pt eager to d/c to home. Recent Psychosocial Issues: Denies Psychiatric History: Denies Substance Abuse History: Pt admits to being a current cigarette smoker for last 60 yrs. Smokes 1.5ppk per day. Admits to [place] ([place])- 3 half gallons  per week.  Patient / Family Perceptions, Expectations &  Goals Pt/Family understanding of illness & functional limitations: Pt has poor insight. Pt family has general understanding of care needs. Premorbid pt/family roles/activities: Independent Anticipated changes in roles/activities/participation: Assistance with ADLs/IADLs Pt/family expectations/goals: Pt goal is to build strength back up.  Community Resources Express Scripts: None Premorbid Home Care/DME Agencies: None Transportation available at discharge: Family Resource referrals recommended: Neuropsychology  Discharge Planning Living Arrangements: Spouse/significant other Support Systems: Spouse/significant other, Children, Other relatives Type of Residence: Private residence Insurance Resources: [employer], Multimedia programmer (specify) Sports administrator) Financial Resources: Fish farm manager, Family Support Financial Screen Referred: No Living Expenses: Own Does the patient have any problems obtaining your medications?: No Care Coordinator Barriers to Discharge: Decreased caregiver support Care Coordinator Anticipated Follow Up Needs: HH/OP Expected length of stay: 10-14 days  Clinical Impression SW met with pt and pt dtrs in room to introduce self, explain role, and discuss discharge process. 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 Does not use any VA benefits. No HCPOA. DME: walking stick.   William Gibson 11/13/2019, 4:54 PM

## 2019-11-13 NOTE — Progress Notes (Signed)
Speech Language Pathology Daily Session Note  Patient Details  Name: William Gibson MRN: [id number] Date of Birth: [birth year]  Today's Date: 11/13/2019 SLP Individual Time: 1400-1500 SLP Individual Time Calculation (min): 60 min  Short Term Goals: Week 1: SLP Short Term Goal 1 (Week 1): Patient will consume current diet with minimal overt s/s of aspiration and Min A verbal cues for use of swallowing compensatory strategies. SLP Short Term Goal 2 (Week 1): Patient will perform pharyngeal strengthening exercises with Min verbal cues for accuracy. SLP Short Term Goal 3 (Week 1): Patient will utilize speech intelligibility strategies to achieve ~90% intelligibility at the sentence level with Min verbal cues. SLP Short Term Goal 4 (Week 1): Patient will demonstrate functional problem solving for basic and familiar tasks with Min A verbal cues. SLP Short Term Goal 5 (Week 1): Patient will recall new, daily information with Mod A verbal and visual cues. SLP Short Term Goal 6 (Week 1): Patient will demonstrate selective attention to functional tasks in a mildly distracting enviornment for 30 minutes with Min verbal cues for redirection.  Skilled Therapeutic Interventions: Skilled treatment session focused on family education and dysphagia goals. SLP facilitated session by providing extensive education regarding patient's current swallowing function, diet recommendations and appropriate textures. All verbalized understanding. SLP provided patient with nectar-thick coffee in which he consumed without overt s/s of aspiration with Mod verbal cues needed for use of effortful swallow and use of sips via straw. SLP also provided education regarding pharyngeal strengthening exercises. Patient performed exercises with overall Min verbal and visual cues. Patient left upright in bed with alarm on, family present and all needs within reach. Continue with current plan of care.      Pain Pain Assessment Pain Scale:  0-10 Pain Score: 5  Pain Type: Acute pain Pain Location: Head Pain Orientation: Anterior Pain Descriptors / Indicators: Headache Pain Frequency: Constant Pain Onset: On-going Pain Intervention(s): Medication (See eMAR) (tylenol)  Therapy/Group: Individual Therapy  [name] 11/13/2019, 3:13 PM

## 2019-11-13 NOTE — Progress Notes (Addendum)
[facility] PHYSICAL MEDICINE & REHABILITATION PROGRESS NOTE   Subjective/Complaints: Wife at bedside, concerns about increased HA, dizziness with reduced intake Pt difficult to elicit Hx, no cough , no nausea or vomiting   ROS: Limited due to cognitive/behavioral   Objective:   No results found. No results for input(s): WBC, HGB, HCT, PLT in the last 72 hours. No results for input(s): NA, K, CL, CO2, GLUCOSE, BUN, CREATININE, CALCIUM in the last 72 hours.  Intake/Output Summary (Last 24 hours) at 11/13/2019 0811 Last data filed at 11/12/2019 1700 Gross per 24 hour  Intake 210 ml  Output --  Net 210 ml     Physical Exam: Vital Signs Blood pressure 135/67, pulse 67, temperature 98.4 F (36.9 C), temperature source Oral, resp. rate 16, height 5\' 9"  (1.753 m), weight 72.2 kg, SpO2 97 %.  Constitutional: No distress . Vital signs reviewed. HEENT: EOMI, oral membranes dry, post pharynx dry Neck: supple Cardiovascular: RRR without murmur. No JVD    Respiratory/Chest: CTA Bilaterally without wheezes or rales. Normal effort    GI/Abdomen: BS +, non-tender, non-distended Ext: no clubbing, cyanosis, or edema Psych: flat, distracted Skin: Clean and intact without signs of breakdown, prominent central suture no skin lesions on scalp, seborrheic dermatitis on face Neuro: alert, speech dysarthric. Follows basic commands. Oriented to person not place. Moves all 4's. Senses pain in all 4's.  Musculoskeletal: Full ROM, No pain with AROM or PROM in the neck, trunk, or extremities. Posture appropriate      Assessment/Plan: 1. Functional deficits secondary to TBI which require 3+ hours per day of interdisciplinary therapy in a comprehensive inpatient rehab setting.  Physiatrist is providing close team supervision and 24 hour management of active medical problems listed below.  Physiatrist and rehab team continue to assess barriers to discharge/monitor patient progress toward functional and  medical goals  Care Tool:  Bathing  Bathing activity did not occur: Refused           Bathing assist       Upper Body Dressing/Undressing Upper body dressing   What is the patient wearing?: Pull over shirt    Upper body assist Assist Level: Supervision/Verbal cueing    Lower Body Dressing/Undressing Lower body dressing      What is the patient wearing?: Pants, Underwear/pull up     Lower body assist Assist for lower body dressing: Moderate Assistance - Patient 50 - 74%     Toileting Toileting    Toileting assist Assist for toileting: Moderate Assistance - Patient 50 - 74%     Transfers Chair/bed transfer  Transfers assist     Chair/bed transfer assist level: Minimal Assistance - Patient > 75%     Locomotion Ambulation   Ambulation assist      Assist level: Moderate Assistance - Patient 50 - 74% Assistive device: No Device Max distance: 265ft   Walk 10 feet activity   Assist     Assist level: Moderate Assistance - Patient - 50 - 74% Assistive device: No Device   Walk 50 feet activity   Assist    Assist level: Moderate Assistance - Patient - 50 - 74% Assistive device: No Device    Walk 150 feet activity   Assist    Assist level: Moderate Assistance - Patient - 50 - 74% Assistive device: No Device    Walk 10 feet on uneven surface  activity   Assist     Assist level: Minimal Assistance - Patient > 75% Assistive device: Other (  comment) (railing on ramp)   Wheelchair     Assist Will patient use wheelchair at discharge?: No             Wheelchair 50 feet with 2 turns activity    Assist            Wheelchair 150 feet activity     Assist          Blood pressure 135/67, pulse 67, temperature 98.4 F (36.9 C), temperature source Oral, resp. rate 16, height 5\' 9"  (1.753 m), weight 72.2 kg, SpO2 97 %.  Medical Problem List and Plan: 1.  Balance deficits, flexed posture, shuffling gait, cognitive  deficits, impairments in attention, dysphagia secondary to TBI with bilateral SAH. Per wife pt with increased HA and dizziness over the weekend no falls no blood thinners              -patient may not shower             -ELOS/Goals: 7-10 days/supervision             -Patient continues CIR therapies  including PT, OT, and SLP  CT Acute bilateral inferior frontal infarcts. Evolving right cerebellar hypodensities, possibly subacute infarcts.- ask neurosurgery to eval , if the cerebellar hypodensities are from ischemic CVAs, will not be able to use blood thinners due to SDH  Increased 5 mm right cerebral convexity subdural collection. New 6.6 mm left cerebral convexity subdural collection. Discussed with wife   Per NS Dr Vertell Limber- scan reviewed, "infarcts" actually evolving contusions No new bleeding, low density collections not requiring drainage 2.  Antithrombotics: -DVT/anticoagulation:  Mechanical: Sequential compression devices, below knee Bilateral lower extremities             -antiplatelet therapy: N/A 3. Persistent Headaches/Pain Management: Getting a little better --continue Oxycodone prn.               -plan to transition to nonnarcotics given history. 4. Mood: LCSW to follow for evaluation and support.              -antipsychotic agents: N/A  -continue sleep chart 5. Neuropsych: This patient is not capable of making decisions on his own behalf. 6. Skin/Wound Care: Routine Routine pressure relief measures.  7. Fluids/Electrolytes/Nutrition: Monitor I/O.  CMP ordered.- poor intake start IVF 8. Alcohol abuse with DTs: resume thiamine and folic acid.   -clonidine taper--BP stable.               Continue to monitor closely. 9.  Dysphagia: Diet modified to dysphagia 1, thin liquids on 07/16.             Advance diet as tolerated per SLP 10. H/o RA: Managed with Meloxicam-- will d/c start low dose prednisone, 1mg  11. Hyponatremia/Hypomagnesemia: Likely due to ETOH abuse.               sodium 134 7/16 12. Hepatic steatosis/Chronic pancreatitis: Continue Thiamine and Folic acid.              LFT's nl 7/16    LOS: 3 days A FACE TO FACE EVALUATION WAS PERFORMED  Charlett Blake 11/13/2019, 8:11 AM

## 2019-11-13 NOTE — Progress Notes (Signed)
Physical Therapy Note  Patient Details  Name: William Gibson MRN: [id number] Date of Birth: 01/06/1946 Today's Date: 11/13/2019    Pt received in bed with wife & MD in room, with MD suggesting in room therapy (pt cleared to walk if feeling up to it) 2/2 pt having HA & not feeling well. Pt in bed & requesting water, so therapist obtained meal tray & thickened liquid but upon return to room transport services present to take pt off unit for imaging. Pt missed 60 minutes of skilled PT treatment. Will f/u per POC.   [name] 11/13/2019, 8:31 AM

## 2019-11-13 NOTE — Progress Notes (Deleted)
SLP Cancellation Note  Patient Details Name: William Gibson MRN: [id number] DOB: 1946/02/08   Cancelled treatment:       Patient missed 60 minutes of skilled SLP intervention due to MD hold.                                                                                                [name] 11/13/2019, 12:45 PM

## 2019-11-13 NOTE — Progress Notes (Signed)
Dr. Letta Pate has discussed CT results with Dr. Gypsy Decant with evolutionary changes and areas of contusions appearing as stroke--were present before. Results were discussed by MD with patient and family. Discussed HA with patient as Oxycodone not holding patient. Patient seen in bed--alert and NAD but spitting out food as reports that it's "not fit to eat". He has been on dysphagia 1, nectars and family would like another test or diet advancement. Discussed his fluctuating mentation and safety as reason fro current recommendations. ST to follow up with them later today.  Topamax bid added for HA.

## 2019-11-13 NOTE — Progress Notes (Addendum)
Occupational Therapy Session Note  Patient Details  Name: William Gibson MRN: [id number] Date of Birth: 08-Jun-1945  Today's Date: 11/13/2019 OT Individual Time: 7989-2119 OT Individual Time Calculation (min): 20 min  and Today's Date: 11/13/2019 OT Missed Time: 90 Minutes Missed Time Reason: Other (comment) (Possible increased bleed and possible new stroke)  Short Term Goals: Week 1:  OT Short Term Goal 1 (Week 1): Pt will stand at sink wiht MIN A for grooming to demo improved balance OT Short Term Goal 2 (Week 1): pt will complete ambualtory transfer to toilet iwht MIN A and LRAD OT Short Term Goal 3 (Week 1): Pt will sequence bathing 10/10 body parts with min VC  Skilled Therapeutic Interventions/Progress Updates:    Pt greeted semi-reclined in bed, Pt complaining of headache. Wife at bedside and tearful 2/2 news of possible new brain bleed and stroke. OT provided emotional support to pt and family member. Pt's spouse upset about visitation policies as her two daughters are on their way up here. Will discuss with nursing director. Pt able to look at spouse and state that was his wife and state her name. OT deferred OOB activity at this time as awaiting neuro consult. OT to follow up per plan of care once medically appropriate.   Therapy Documentation Precautions:  Precautions Precautions: Fall, Other (comment) Precaution Comments: forward trunk flexion in standing/gait Restrictions Weight Bearing Restrictions: No General: General OT Amount of Missed Time: 75 Minutes OT Missed Treatment Reason: Pt with new bleed and stroke per CT, will follow up per plan of care. Pain: Pain Assessment Pain Scale: Faces Faces Pain Scale: Hurts even more Pain Location: Head Pain Descriptors / Indicators: Headache;Aching Pain Intervention(s): Medication (See eMAR);Rest   Therapy/Group: Individual Therapy  Valma Cava 11/13/2019, 10:29 AM

## 2019-11-14 ENCOUNTER — Inpatient Hospital Stay (HOSPITAL_COMMUNITY): Payer: 59 | Admitting: Occupational Therapy

## 2019-11-14 ENCOUNTER — Inpatient Hospital Stay (HOSPITAL_COMMUNITY): Payer: 59 | Admitting: Speech Pathology

## 2019-11-14 ENCOUNTER — Inpatient Hospital Stay (HOSPITAL_COMMUNITY): Payer: 59 | Admitting: Physical Therapy

## 2019-11-14 DIAGNOSIS — M069 Rheumatoid arthritis, unspecified: Secondary | ICD-10-CM

## 2019-11-14 DIAGNOSIS — F1010 Alcohol abuse, uncomplicated: Secondary | ICD-10-CM

## 2019-11-14 DIAGNOSIS — S069X3D Unspecified intracranial injury with loss of consciousness of 1 hour to 5 hours 59 minutes, subsequent encounter: Secondary | ICD-10-CM

## 2019-11-14 DIAGNOSIS — R1312 Dysphagia, oropharyngeal phase: Secondary | ICD-10-CM

## 2019-11-14 MED ORDER — MAGIC MOUTHWASH W/LIDOCAINE
5.0000 mL | Freq: Three times a day (TID) | ORAL | Status: DC
  Administered 2019-11-14 – 2019-11-16 (×7): 5 mL via ORAL
  Filled 2019-11-14 (×6): qty 5

## 2019-11-14 MED ORDER — SODIUM CHLORIDE 0.45 % IV SOLN: INTRAVENOUS | Status: DC

## 2019-11-14 NOTE — Progress Notes (Signed)
Speech Language Pathology Daily Session Note  Patient Details  Name: William Gibson MRN: [id number] Date of Birth: [birth year]  Today's Date: 11/14/2019 SLP Individual Time: 0815-0900 SLP Individual Time Calculation (min): 45 min  Short Term Goals: Week 1: SLP Short Term Goal 1 (Week 1): Patient will consume current diet with minimal overt s/s of aspiration and Min A verbal cues for use of swallowing compensatory strategies. SLP Short Term Goal 2 (Week 1): Patient will perform pharyngeal strengthening exercises with Min verbal cues for accuracy. SLP Short Term Goal 3 (Week 1): Patient will utilize speech intelligibility strategies to achieve ~90% intelligibility at the sentence level with Min verbal cues. SLP Short Term Goal 4 (Week 1): Patient will demonstrate functional problem solving for basic and familiar tasks with Min A verbal cues. SLP Short Term Goal 5 (Week 1): Patient will recall new, daily information with Mod A verbal and visual cues. SLP Short Term Goal 6 (Week 1): Patient will demonstrate selective attention to functional tasks in a mildly distracting enviornment for 30 minutes with Min verbal cues for redirection.  Skilled Therapeutic Interventions: Skilled treatment session focused on dysphagia and cognitive goals. SLP facilitated session by providing Min A verbal cues for accuracy with pharyngeal strengthening exercises. Patient also consumed trials of ice chips without overt s/s of aspiration with Min A verbal cues needed to "swallow hard." Recommend ongoing trials with SLP. Patient also consumed nectar-thick liquids via tsp (10 oz of NTL coffee) without overt s/s of aspiration and Min A for verbal cues to "swallow hard." Patient oriented to place, time and situation with overall supervision level verbal cues and participated in a functional conversation for ~10 minutes with overall Mod I. Patient left upright in bed with alarm on and all needs within reach. Continue with current  plan of care.      Pain 5/10 RN aware and medications administered  Therapy/Group: Individual Therapy  Lilinoe Acklin 11/14/2019, 10:04 AM

## 2019-11-14 NOTE — Progress Notes (Addendum)
[place] PHYSICAL MEDICINE & REHABILITATION PROGRESS NOTE   Subjective/Complaints: Pt still c/o dull frontal-temporal headache, sore throat, slept from 12 to 3 last night.   ROS: Patient denies fever, rash, sore throat, blurred vision, nausea, vomiting, diarrhea, cough, shortness of breath or chest pain, joint or back pain,   or mood change.    Objective:   CT HEAD WO CONTRAST  Result Date: 11/13/2019 CLINICAL DATA:  Stroke, follow-up. EXAM: CT HEAD WITHOUT CONTRAST TECHNIQUE: Contiguous axial images were obtained from the base of the skull through the vertex without intravenous contrast. COMPARISON:  11/04/2019 head CT. FINDINGS: Brain: Prior subarachnoid hemorrhage overlying the bilateral cerebral convexities is less conspicuous than prior exam. Increased conspicuity of intermediate density right subdural collection measuring up to 5 mm (5:41). New low-density subdural collection overlying the left cerebral convexity measures up to 6.6 mm (3:21). New low-density appearance of the right greater than left bilateral inferior frontal lobes (3:12, 13). Right cerebellar low-density foci are more conspicuous than prior exam and may reflect evolving infarcts (5:60). No mass lesion. No midline shift or ventriculomegaly. Background scattered and confluent supratentorial white matter hypodense foci likely reflect chronic microvascular ischemic changes. Vascular: No hyperdense vessel. Bilateral carotid siphon atherosclerotic calcifications. Skull: Nondisplaced right parasagittal occipital fracture is unchanged. Sinuses/Orbits: Normal orbits. Right sphenoid sinus mucous retention cyst. No mastoid effusion. Other: Tiny right frontal metallic density is unchanged. IMPRESSION: Acute bilateral inferior frontal infarcts. Evolving right cerebellar hypodensities, possibly subacute infarcts. Increased 5 mm right cerebral convexity subdural collection. New 6.6 mm left cerebral convexity subdural collection. Bilateral  cerebral convexity subarachnoid hemorrhage is less conspicuous than prior exam. These results were called by telephone at the time of interpretation on 11/13/2019 at 9:28 am to provider Alysia Penna , who verbally acknowledged these results. Electronically Signed   By: Primitivo Gauze M.D.   On: 11/13/2019 09:36   Recent Labs    11/13/19 1105  WBC 8.4  HGB 14.4  HCT 42.7  PLT 270   Recent Labs    11/13/19 1105  NA 134*  K 4.3  CL 100  CO2 24  GLUCOSE 111*  BUN 16  CREATININE 0.86  CALCIUM 9.4    Intake/Output Summary (Last 24 hours) at 11/14/2019 0951 Last data filed at 11/14/2019 0903 Gross per 24 hour  Intake 829.41 ml  Output --  Net 829.41 ml     Physical Exam: Vital Signs Blood pressure (!) 143/90, pulse 60, temperature 97.8 F (36.6 C), temperature source Oral, resp. rate 16, height 5\' 9"  (1.753 m), weight 72.2 kg, SpO2 98 %.  Constitutional: No distress . Vital signs reviewed. HEENT: EOMI, oral membranes moist, no exudate Neck: supple Cardiovascular: RRR without murmur. No JVD    Respiratory/Chest: CTA Bilaterally without wheezes or rales. Normal effort    GI/Abdomen: BS +, non-tender, non-distended Ext: no clubbing, cyanosis, or edema Psych: engaging, cooperative Skin: Clean and intact without signs of breakdown, prominent central suture no skin lesions on scalp, seborrheic dermatitis on face Neuro:alert, much more attentive. Speech pretty clear. Follows basic commands. Oriented to person not place. Moves all 4's. Senses pain in all 4's.  Musculoskeletal: Full ROM, No pain with AROM or PROM in the neck, trunk, or extremities.        Assessment/Plan: 1. Functional deficits secondary to TBI which require 3+ hours per day of interdisciplinary therapy in a comprehensive inpatient rehab setting.  Physiatrist is providing close team supervision and 24 hour management of active medical problems listed below.  Physiatrist and rehab team continue to assess  barriers to discharge/monitor patient progress toward functional and medical goals  Care Tool:  Bathing  Bathing activity did not occur: Refused           Bathing assist       Upper Body Dressing/Undressing Upper body dressing   What is the patient wearing?: Pull over shirt    Upper body assist Assist Level: Supervision/Verbal cueing    Lower Body Dressing/Undressing Lower body dressing      What is the patient wearing?: Pants, Underwear/pull up     Lower body assist Assist for lower body dressing: Moderate Assistance - Patient 50 - 74%     Toileting Toileting    Toileting assist Assist for toileting: Moderate Assistance - Patient 50 - 74%     Transfers Chair/bed transfer  Transfers assist     Chair/bed transfer assist level: Minimal Assistance - Patient > 75%     Locomotion Ambulation   Ambulation assist      Assist level: Moderate Assistance - Patient 50 - 74% Assistive device: No Device Max distance: 225ft   Walk 10 feet activity   Assist     Assist level: Moderate Assistance - Patient - 50 - 74% Assistive device: No Device   Walk 50 feet activity   Assist    Assist level: Moderate Assistance - Patient - 50 - 74% Assistive device: No Device    Walk 150 feet activity   Assist    Assist level: Moderate Assistance - Patient - 50 - 74% Assistive device: No Device    Walk 10 feet on uneven surface  activity   Assist     Assist level: Minimal Assistance - Patient > 75% Assistive device: Other (comment) (railing on ramp)   Wheelchair     Assist Will patient use wheelchair at discharge?: No             Wheelchair 50 feet with 2 turns activity    Assist            Wheelchair 150 feet activity     Assist          Blood pressure (!) 143/90, pulse 60, temperature 97.8 F (36.6 C), temperature source Oral, resp. rate 16, height 5\' 9"  (1.753 m), weight 72.2 kg, SpO2 98 %.  Medical Problem List and  Plan: 1.  Balance deficits, flexed posture, shuffling gait, cognitive deficits, impairments in attention, dysphagia secondary to TBI with bilateral SAH. Per wife pt with increased HA and dizziness over the weekend no falls no blood thinners              -patient may shower             -ELOS/Goals: 7-10 days/supervision             -Patient continues CIR therapies  including PT, OT, and SLP    -CT results reviewed with NS, family. Appear to be evolution of SDH, SAH, contusions and not "strokes" as identified on report. 2.  Antithrombotics: -DVT/anticoagulation:  Mechanical: Sequential compression devices, below knee Bilateral lower extremities             -antiplatelet therapy: N/A 3. Persistent Headaches/Pain Management: Getting a little better --continue Oxycodone prn.               -plan to transition to nonnarcotics given history.  -topamax trial for headaches 25mg  bid started 7/19 4. Mood: LCSW to follow for evaluation and support.              -  antipsychotic agents: N/A  -continue sleep chart 5. Neuropsych: This patient is not capable of making decisions on his own behalf. 6. Skin/Wound Care: Routine Routine pressure relief measures.  7. Fluids/Electrolytes/Nutrition: doesn't like diet, has sore throat  -continue IVF but change to HS  -treat sore throat  -adv diet as possible 8. Tobacco abuse and Alcohol abuse with DTs: resumed thiamine and folic acid.   -clonidine taper--BP stable.               -pt reported that he had a regular whiskey and coke at home. 9.  Dysphagia: Diet modified to dysphagia 1, thin liquids on 07/16.             Advance diet as tolerated per SLP 10. H/o RA: Managed with Meloxicam--changed to low dose prednisone, 1mg  11. Hyponatremia/Hypomagnesemia: Likely due to ETOH abuse.              sodium 134 7/16 12. Hepatic steatosis/Chronic pancreatitis: Continue Thiamine and Folic acid.              LFT's nl 7/19    LOS: 4 days A FACE TO FACE EVALUATION WAS  PERFORMED  Meredith Staggers 11/14/2019, 9:51 AM

## 2019-11-14 NOTE — Progress Notes (Addendum)
Patient ID: ARIO MCDIARMID, male   DOB: [birth year], 74 y.o.   MRN: [id number]  SW met with pt in room to provide updates from team conference, and d/c date 7/29.   SW called pt wife [name] [phone]) to inform on above. SW discussed current d/c recs for outpatient therapies, pt needs 3in1 BSC, and 24/7 care. Family education on 7/28 9am-12am. SW informed there will continue to be updates on pt care needs.   Loralee Pacas, MSW, [place] Office: [phone] Cell: [phone] Fax: [phone]

## 2019-11-14 NOTE — Plan of Care (Signed)
  Problem: Consults Goal: RH BRAIN INJURY PATIENT EDUCATION Description: Description: See Patient Education module for eduction specifics Outcome: Progressing Goal: Skin Care Protocol Initiated - if Braden Score 18 or less Description: If consults are not indicated, leave blank or document N/A Outcome: Progressing Goal: Nutrition Consult-if indicated Outcome: Progressing Goal: Diabetes Guidelines if Diabetic/Glucose > 140 Description: If diabetic or lab glucose is > 140 mg/dl - Initiate Diabetes/Hyperglycemia Guidelines & Document Interventions  Outcome: Progressing   Problem: RH BOWEL ELIMINATION Goal: RH STG MANAGE BOWEL WITH ASSISTANCE Description: STG Manage Bowel with Assistance. Outcome: Progressing Goal: RH STG MANAGE BOWEL W/MEDICATION W/ASSISTANCE Description: STG Manage Bowel with Medication with Assistance. Outcome: Progressing   Problem: RH BLADDER ELIMINATION Goal: RH STG MANAGE BLADDER WITH ASSISTANCE Description: STG Manage Bladder With Assistance Outcome: Progressing Goal: RH STG MANAGE BLADDER WITH MEDICATION WITH ASSISTANCE Description: STG Manage Bladder With Medication With Assistance. Outcome: Progressing Goal: RH STG MANAGE BLADDER WITH EQUIPMENT WITH ASSISTANCE Description: STG Manage Bladder With Equipment With Assistance Outcome: Progressing   Problem: RH SKIN INTEGRITY Goal: RH STG SKIN FREE OF INFECTION/BREAKDOWN Description: With moderate assistance. Outcome: Progressing   Problem: RH SAFETY Goal: RH STG ADHERE TO SAFETY PRECAUTIONS W/ASSISTANCE/DEVICE Description: STG Adhere to Safety Precautions With Assistance/Device. Outcome: Progressing

## 2019-11-14 NOTE — Patient Care Conference (Signed)
Inpatient RehabilitationTeam Conference and Plan of Care Update Date: 11/14/2019   Time: 2:51 PM    Patient Name: William Gibson      Medical Record Number: [id number]  Date of Birth: 1946-01-04 Sex: Male         Room/Bed: 4W13C/4W13C-01 Payor Info: Payor: [employer] / Plan: [employer] OTHER / Product Type: *No Product type* /    Admit Date/Time:  11/10/2019  3:40 PM  Primary Diagnosis:  TBI (traumatic brain injury) [facility])  Hospital Problems: Principal Problem:   TBI (traumatic brain injury) [facility])    Expected Discharge Date: Expected Discharge Date: 11/23/19  Team Members Present: Physician leading conference: Dr. Alger Simons Care Coodinator Present: Loralee Pacas, LCSWA;Andrell Bergeson Creig Hines, RN, BSN, [place] Nurse Present: Other (comment) Tommie Sams) PT Present: Lavone Nian, PT OT Present: Cherylynn Ridges, OT SLP Present: Weston Anna, SLP PPS Coordinator present : Gunnar Fusi, SLP     Current Status/Progress Goal Weekly Team Focus  Bowel/Bladder   Continent of bowel and bladder LBM 11/12/19  To maintain continence of bowel and bladder  Assess q shift and PRN   Swallow/Nutrition/ Hydration   Dys. 1 textures with nectar-thick liquids via spoon, Mod A  Supervision  use of swallowing compensatory strategies, pharyngeal strengthening exercises, trials of upgraded liquids/textures   ADL's   Min A overall  Supervision overall  general strengthening, activity tolerance, pt/family education, dc planning, cognitive retraining, self-care retraining   Mobility   min assist overall except mod assist gait without AD  supervision overall with LRAD  endurance, activity tolerance, transfers, balance, gait, stair negotiation, NMR, pt/family education, d/c planning   Communication   Supervision  Supervision  use of speech intelligibility strategies   Safety/Cognition/ Behavioral Observations  Min-Mod A  Superviseion  basic problem solving, recall, attention,  awareness   Pain   Complains of headache 5/10  Less than 2/10  Assess pain q shift and prn   Skin   no current skin issues  maintain skin integrity  assess q shift and prn     Team Discussion:  Discharge Planning/Teaching Needs:  d/c to home with 24/7 care from his wife, and support from dtrs and various family members  Family education as recommended by therapy   Current Update:  none  Current Barriers to Discharge:  Pain, safety.  Possible Resolutions to Barriers: On-going pain management, continuing patient and family education on fall and safety awareness.  Patient on target to meet rehab goals: yes, Continent of B/B, complaints of headache. Poor safety awareness. Min-mod assist with PT, supervision goals. Min assist with OT. SLP, not ready at this time for another modified swallow test.  *See Care Plan and progress notes for long and short-term goals.   Revisions to Treatment Plan:  none    Medical Summary Current Status: bilateral SAH/TBI after fall. heavy ETOH abuse with prior gait disorder. tobacco abuse Weekly Focus/Goal: pain mgt, TBI treatment considerations, nutrition  Barriers to Discharge: Behavior;Medical stability   Possible Resolutions to Barriers: ongoing medical mgt, education re: substance abuse, TBI recovery   Continued Need for Acute Rehabilitation Level of Care: The patient requires daily medical management by a physician with specialized training in physical medicine and rehabilitation for the following reasons: Direction of a multidisciplinary physical rehabilitation program to maximize functional independence : Yes Medical management of patient stability for increased activity during participation in an intensive rehabilitation regime.: Yes Analysis of laboratory values and/or radiology reports with any subsequent need for medication adjustment and/or medical  intervention. : Yes   I attest that I was present, lead the team conference, and concur  with the assessment and plan of the team.   Cristi Loron 11/14/2019, 2:51 PM

## 2019-11-14 NOTE — Progress Notes (Signed)
Occupational Therapy Session Note  Patient Details  Name: William Gibson MRN: [id number] Date of Birth: May 16, 1945  Today's Date: 11/14/2019 OT Individual Time: 1300-1330 OT Individual Time Calculation (min): 30 min    Short Term Goals: Week 1:  OT Short Term Goal 1 (Week 1): Pt will stand at sink wiht MIN A for grooming to demo improved balance OT Short Term Goal 2 (Week 1): pt will complete ambualtory transfer to toilet iwht MIN A and LRAD OT Short Term Goal 3 (Week 1): Pt will sequence bathing 10/10 body parts with min VC  Skilled Therapeutic Interventions/Progress Updates:    Patient seated in w/c, alert and ready for therapy session.   He denies pain, states that he doesn't want to be here as long as the projected discharge date.  Reviewed goals for therapy, mobility/balance and safety/decision making during daily routine that need be achieved in order to go home and ensure a safe transition - he verbally agreed to plan and was cooperative t/o session.  Completed ambulation activities with RW CGA.  Completed seated core exercises with min cues for technique initially and then able to demonstrate good carryover.   Completed standing reach and balance activity with CS.  He ambulated from therapy gym to room CS/CGA with RW, remained seated in w/c at close of session with seat belt alarm set, telesitter in place, call bell in hand.    Therapy Documentation Precautions:  Precautions Precautions: Fall, Other (comment) Precaution Comments: forward trunk flexion in standing/gait Restrictions Weight Bearing Restrictions: No  Therapy/Group: Individual Therapy  Carlos Levering 11/14/2019, 7:44 AM

## 2019-11-14 NOTE — Progress Notes (Signed)
Physical Therapy Session Note  Patient Details  Name: William Gibson MRN: [id number] Date of Birth: 1946/04/07  Today's Date: 11/14/2019 PT Individual Time: 1404-1500 PT Individual Time Calculation (min): 56 min   Short Term Goals: Week 1:  PT Short Term Goal 1 (Week 1): Pt will perform supine<>sit with CGA PT Short Term Goal 2 (Week 1): Pt will perform functional transfers using LRAD with CGA PT Short Term Goal 3 (Week 1): Pt will ambulate at least 143ft using LRAD with CGA PT Short Term Goal 4 (Week 1): Pt will ascend/descend 4 steps using HRs per home set-up with CGA  Skilled Therapeutic Interventions/Progress Updates:  Pt received in w/c & agreeable to tx. Gait x 100 ft with RW & min assist + 100 ft with min/mod assist without AD. Pt demonstrates forward trunk lean that worsens without AD, causing LOB with pt unable to self correct. Pt transferred to prone on mat table for trunk/hip flexor stretch & was able to progress to prone on elbows for additional stretch. Pt transitioned to tall kneeling at bench & engaged in pipe tree activity, correctly assembling 2 simple shapes with only min cuing x 1 time. Pt performed hip/knee flexion>full extension in upright tall kneeling position to focus on glute/hamstring strengthening & upright posture with PT facilitating correct movement/posture. Pt performed step taps on 6" step with min/mod assist for standing balance without UE support with focus on weight shifting L<>R, LE coordination, & dynamic balance. At end of session pt left in w/c with chair alarm donned & call bell in reach, telesitter in room.  Therapy Documentation Precautions:  Precautions Precautions: Fall, Other (comment) Precaution Comments: forward trunk flexion in standing/gait Restrictions Weight Bearing Restrictions: No  Pain: Pt c/o "mild" HA in front of head & behind eyes, reports he received medication earlier.   Therapy/Group: Individual Therapy  [name] 11/14/2019, 4:12 PM

## 2019-11-14 NOTE — Plan of Care (Signed)
Behavioral Plan   [place] Level: Six  Behavior to decrease/ eliminate: Impulsivity (especially when needing to use bathroom) Lethargy   Changes to environment:  Pt already transferred from [place] to [place], near the nurses station   Interventions: Telesitter Timed toileting every 2 hours Sleep/wake chart Offer pain medications prior to sleep to help ease HA    Recommendations for interactions with patient: Ask if he needs to use the bathroom Offer nectar thick coffee via teaspoon Pt is hard of hearing so speak loudly (pt hears better on the right side)   Attendees: Weston Anna, MS, CCC-SLP Cherylynn Ridges, OTR/L Lavone Nian, PT, DPT

## 2019-11-14 NOTE — Progress Notes (Signed)
Occupational Therapy Session Note  Patient Details  Name: William Gibson MRN: [id number] Date of Birth: [birth year]  Today's Date: 11/14/2019 OT Individual Time: 0932-1029 OT Individual Time Calculation (min): 57 min    Short Term Goals: Week 1:  OT Short Term Goal 1 (Week 1): Pt will stand at sink wiht MIN A for grooming to demo improved balance OT Short Term Goal 2 (Week 1): pt will complete ambualtory transfer to toilet iwht MIN A and LRAD OT Short Term Goal 3 (Week 1): Pt will sequence bathing 10/10 body parts with min VC  Skilled Therapeutic Interventions/Progress Updates:    Pt greeted semi-reclined in bed and agreeable to OT treatment session. Pt agreeable to shower today. OT had nursing hold IV for shower. Pt completed bed mobility with min A. He took extra time at EOB due to dizziness. Pt then completed stand with min A, and ambulated into bathroom w/ RW and min/mod A. Pt needed mod cues for RW management and balance. Pt completed bathing from shower seat with min A overall for balance. Stand-pivot out of shower min A. Worked on dressing from wc at the sink with pt needing min A when standing to pull up pants 2/2 posterior LOB. Pt then reported need to urinate and ambulated back into bathroom w/ RW and min/mod A. Pt stood to urinate with min A for balance. Pt left seated in wc at end of session with alarm belt on, call bell in reach, and needs met.   Therapy Documentation Precautions:  Precautions Precautions: Fall, Other (comment) Precaution Comments: forward trunk flexion in standing/gait Restrictions Weight Bearing Restrictions: No Pain:  Pt reports mild headache, did not give number. Pt stated headache much better than this morning  Therapy/Group: Individual Therapy  Valma Cava 11/14/2019, 10:20 AM

## 2019-11-15 ENCOUNTER — Inpatient Hospital Stay (HOSPITAL_COMMUNITY): Payer: 59 | Admitting: Speech Pathology

## 2019-11-15 ENCOUNTER — Inpatient Hospital Stay (HOSPITAL_COMMUNITY): Payer: 59 | Admitting: Physical Therapy

## 2019-11-15 ENCOUNTER — Inpatient Hospital Stay (HOSPITAL_COMMUNITY): Payer: 59 | Admitting: Occupational Therapy

## 2019-11-15 DIAGNOSIS — S069X3S Unspecified intracranial injury with loss of consciousness of 1 hour to 5 hours 59 minutes, sequela: Secondary | ICD-10-CM

## 2019-11-15 MED ORDER — BIOTENE DRY MOUTH MT LIQD: 15.0000 mL | OROMUCOSAL | Status: DC | PRN

## 2019-11-15 MED ORDER — ENSURE MAX PROTEIN PO LIQD
11.0000 [oz_av] | Freq: Every day | ORAL | Status: DC
  Administered 2019-11-15 – 2019-11-23 (×9): 11 [oz_av] via ORAL
  Filled 2019-11-15 (×9): qty 330

## 2019-11-15 MED ORDER — ACETAMINOPHEN 325 MG PO TABS: 325.0000 mg | ORAL_TABLET | ORAL | Status: DC | PRN

## 2019-11-15 MED ORDER — SODIUM CHLORIDE 0.45 % IV SOLN
INTRAVENOUS | Status: DC
  Filled 2019-11-15: qty 1000

## 2019-11-15 NOTE — Progress Notes (Signed)
Speech Language Pathology Daily Session Note  Patient Details  Name: William Gibson MRN: [id number] Date of Birth: 25-Nov-1945  Today's Date: 11/15/2019 SLP Individual Time: 1330-1410 SLP Individual Time Calculation (min): 40 min  Short Term Goals: Week 1: SLP Short Term Goal 1 (Week 1): Patient will consume current diet with minimal overt s/s of aspiration and Min A verbal cues for use of swallowing compensatory strategies. SLP Short Term Goal 2 (Week 1): Patient will perform pharyngeal strengthening exercises with Min verbal cues for accuracy. SLP Short Term Goal 3 (Week 1): Patient will utilize speech intelligibility strategies to achieve ~90% intelligibility at the sentence level with Min verbal cues. SLP Short Term Goal 4 (Week 1): Patient will demonstrate functional problem solving for basic and familiar tasks with Min A verbal cues. SLP Short Term Goal 5 (Week 1): Patient will recall new, daily information with Mod A verbal and visual cues. SLP Short Term Goal 6 (Week 1): Patient will demonstrate selective attention to functional tasks in a mildly distracting enviornment for 30 minutes with Min verbal cues for redirection.  Skilled Therapeutic Interventions: Skilled treatment session focused on dysphagia goals. SLP facilitated session by providing Min A verbal cues for accuracy with pharyngeal strengthening exercises. Patient also consumed trials of ice chips without overt s/s of aspiration with Min A verbal cues needed to "swallow hard." Recommend repeat MBS tomorrow to assess swallow function. Patient also consumed nectar-thick liquids via tsp (10 oz of NTL coffee) without overt s/s of aspiration. Patient's family present and educated on current plan of care, they verbalize understanding. Patient left upright in wheelchair with alarm on and all needs within reach. Continue with current plan of care.          Pain Pain Assessment Pain Scale: 0-10 Pain Score: 3  Pain Location:  Head  Therapy/Group: Individual Therapy  Natara Monfort 11/15/2019, 3:30 PM

## 2019-11-15 NOTE — Progress Notes (Signed)
[place] PHYSICAL MEDICINE & REHABILITATION PROGRESS NOTE   Subjective/Complaints: Having headaches but perhaps a bit better. Participating with therapies  ROS: Limited due to cognitive/behavioral     Objective:   CT HEAD WO CONTRAST  Result Date: 11/13/2019 CLINICAL DATA:  Stroke, follow-up. EXAM: CT HEAD WITHOUT CONTRAST TECHNIQUE: Contiguous axial images were obtained from the base of the skull through the vertex without intravenous contrast. COMPARISON:  11/04/2019 head CT. FINDINGS: Brain: Prior subarachnoid hemorrhage overlying the bilateral cerebral convexities is less conspicuous than prior exam. Increased conspicuity of intermediate density right subdural collection measuring up to 5 mm (5:41). New low-density subdural collection overlying the left cerebral convexity measures up to 6.6 mm (3:21). New low-density appearance of the right greater than left bilateral inferior frontal lobes (3:12, 13). Right cerebellar low-density foci are more conspicuous than prior exam and may reflect evolving infarcts (5:60). No mass lesion. No midline shift or ventriculomegaly. Background scattered and confluent supratentorial white matter hypodense foci likely reflect chronic microvascular ischemic changes. Vascular: No hyperdense vessel. Bilateral carotid siphon atherosclerotic calcifications. Skull: Nondisplaced right parasagittal occipital fracture is unchanged. Sinuses/Orbits: Normal orbits. Right sphenoid sinus mucous retention cyst. No mastoid effusion. Other: Tiny right frontal metallic density is unchanged. IMPRESSION: Acute bilateral inferior frontal infarcts. Evolving right cerebellar hypodensities, possibly subacute infarcts. Increased 5 mm right cerebral convexity subdural collection. New 6.6 mm left cerebral convexity subdural collection. Bilateral cerebral convexity subarachnoid hemorrhage is less conspicuous than prior exam. These results were called by telephone at the time of  interpretation on 11/13/2019 at 9:28 am to provider Alysia Penna , who verbally acknowledged these results. Electronically Signed   By: Primitivo Gauze M.D.   On: 11/13/2019 09:36   Recent Labs    11/13/19 1105  WBC 8.4  HGB 14.4  HCT 42.7  PLT 270   Recent Labs    11/13/19 1105  NA 134*  K 4.3  CL 100  CO2 24  GLUCOSE 111*  BUN 16  CREATININE 0.86  CALCIUM 9.4    Intake/Output Summary (Last 24 hours) at 11/15/2019 0839 Last data filed at 11/15/2019 0657 Gross per 24 hour  Intake 695 ml  Output 425 ml  Net 270 ml     Physical Exam: Vital Signs Blood pressure 131/82, pulse 62, temperature (!) 97.3 F (36.3 C), temperature source Oral, resp. rate 18, height 5\' 9"  (1.753 m), weight 72.2 kg, SpO2 98 %.  Constitutional: No distress . Vital signs reviewed. HEENT: EOMI, oral membranes moist Neck: supple Cardiovascular: RRR without murmur. No JVD    Respiratory/Chest: CTA Bilaterally without wheezes or rales. Normal effort    GI/Abdomen: BS +, non-tender, non-distended Ext: no clubbing, cyanosis, or edema Psych: pleasant and cooperative for the most part Skin: Clean and intact without signs of breakdown, prominent central suture no skin lesions on scalp, seborrheic dermatitis on face Neuro: alert, conversational. Normal language. Oriented to person not place. Moves all 4's. Senses pain in all 4's.  Musculoskeletal: Full ROM, No pain with AROM or PROM in the neck, trunk, or extremities.        Assessment/Plan: 1. Functional deficits secondary to TBI which require 3+ hours per day of interdisciplinary therapy in a comprehensive inpatient rehab setting.  Physiatrist is providing close team supervision and 24 hour management of active medical problems listed below.  Physiatrist and rehab team continue to assess barriers to discharge/monitor patient progress toward functional and medical goals  Care Tool:  Bathing  Bathing activity did not occur: Refused  Body  parts bathed by patient: Right arm, Left arm, Chest, Abdomen, Front perineal area, Buttocks, Right upper leg, Left upper leg, Right lower leg, Left lower leg, Face         Bathing assist Assist Level: Minimal Assistance - Patient > 75%     Upper Body Dressing/Undressing Upper body dressing   What is the patient wearing?: Pull over shirt    Upper body assist Assist Level: Minimal Assistance - Patient > 75%    Lower Body Dressing/Undressing Lower body dressing      What is the patient wearing?: Pants     Lower body assist Assist for lower body dressing: Minimal Assistance - Patient > 75%     Toileting Toileting    Toileting assist Assist for toileting: Minimal Assistance - Patient > 75%     Transfers Chair/bed transfer  Transfers assist     Chair/bed transfer assist level: Contact Guard/Touching assist     Locomotion Ambulation   Ambulation assist      Assist level: Minimal Assistance - Patient > 75% Assistive device: Walker-rolling Max distance: 100 ft   Walk 10 feet activity   Assist     Assist level: Minimal Assistance - Patient > 75% Assistive device: Walker-rolling   Walk 50 feet activity   Assist    Assist level: Minimal Assistance - Patient > 75% Assistive device: Walker-rolling    Walk 150 feet activity   Assist    Assist level: Moderate Assistance - Patient - 50 - 74% Assistive device: No Device    Walk 10 feet on uneven surface  activity   Assist     Assist level: Minimal Assistance - Patient > 75% Assistive device: Other (comment) (railing on ramp)   Wheelchair     Assist Will patient use wheelchair at discharge?: No             Wheelchair 50 feet with 2 turns activity    Assist            Wheelchair 150 feet activity     Assist          Blood pressure 131/82, pulse 62, temperature (!) 97.3 F (36.3 C), temperature source Oral, resp. rate 18, height 5\' 9"  (1.753 m), weight 72.2 kg,  SpO2 98 %.  Medical Problem List and Plan: 1.  Balance deficits, flexed posture, shuffling gait, cognitive deficits, impairments in attention, dysphagia secondary to TBI with bilateral SAH. Per wife pt with increased HA and dizziness over the weekend no falls no blood thinners              -patient may shower             -ELOS/Goals: 7-10 days/supervision             -Patient continues CIR therapies  including PT, OT, and SLP    -CT results have been reviewed with NS, family. Appear to be evolution of SDH, SAH, contusions and not "strokes" as identified on report. 2.  Antithrombotics: -DVT/anticoagulation:  Mechanical: Sequential compression devices, below knee Bilateral lower extremities             -antiplatelet therapy: N/A 3. Persistent Headaches/Pain Management: Getting a little better --continue Oxycodone prn.               -plan to transition to nonnarcotics given history.  -topamax trial for headaches 25mg  bid started 7/19 with some benefit 4. Mood: LCSW to follow for evaluation and support.              -  antipsychotic agents: N/A  -continue sleep chart 5. Neuropsych: This patient is not capable of making decisions on his own behalf. 6. Skin/Wound Care: Routine Routine pressure relief measures.  7. Fluids/Electrolytes/Nutrition: doesn't like diet, has sore throat  -IVF at [facility] for now--will dc 7/21  -treat sore throat  -adv diet as possible  -check labs 7/22 8. Tobacco abuse and Alcohol abuse with DTs: resumed thiamine and folic acid.   -clonidine taper--BP stable.               -pt reported that he had a regular whiskey and coke at home. 9.  Dysphagia: Diet modified to dysphagia 1, thin liquids on 07/16.             Advance diet as tolerated per SLP 10. H/o RA: Managed with Meloxicam--changed to low dose prednisone, 1mg  11. Hyponatremia/Hypomagnesemia: Likely due to ETOH abuse.              sodium 134 7/16 12. Hepatic steatosis/Chronic pancreatitis: Continue Thiamine and Folic  acid.              LFT's nl 7/19    LOS: 5 days A FACE TO FACE EVALUATION WAS PERFORMED  Meredith Staggers 11/15/2019, 8:39 AM

## 2019-11-15 NOTE — Progress Notes (Signed)
Occupational Therapy Session Note  Patient Details  Name: William Gibson MRN: [id number] Date of Birth: [birth year]  Today's Date: 11/15/2019 OT Individual Time: 1105-1205 OT Individual Time Calculation (min): 60 min    Short Term Goals: Week 1:  OT Short Term Goal 1 (Week 1): Pt will stand at sink wiht MIN A for grooming to demo improved balance OT Short Term Goal 2 (Week 1): pt will complete ambualtory transfer to toilet iwht MIN A and LRAD OT Short Term Goal 3 (Week 1): Pt will sequence bathing 10/10 body parts with min VC Week 2:     Skilled Therapeutic Interventions/Progress Updates:    1:1 self care retraining at shower level. Pt navigated around the room with contact guard without AD. Focus on sequencing and organizing self in familiar tasks. Pt able to shower sit to stand with supervision with moderate distraction of conversation. Pt did doff and donn clothing in standing position with contact guard/ supervision with use of wall to support at times. Pt reported seeing gnats all night in his room but not present in the day- not able to reason this out. Was not oriented to day of week but knew he wanted to talk to [name] about d/c earlier than his set date. Able to carry on conversation during the familiar tasks. Ambulated to the dayroom with contact guard without AD. Performed the Pill Box test. Pt able to perform the [facility] needed for the test but did make omission, misplaced movement and commission errors requiring max A to completely complete the task correctly. Pt with difficulty with working memory to hold to information of how the pill organizer was laid out.   Pt returned to his room and left sitting in w/c with self belt, wife and telesitter in room.  Therapy Documentation Precautions:  Precautions Precautions: Fall, Other (comment) Precaution Comments: forward trunk flexion in standing/gait Restrictions Weight Bearing Restrictions: No General:   Vital Signs: Therapy  Vitals Temp: 98 F (36.7 C) Pulse Rate: 90 Resp: 16 BP: 120/76 Patient Position (if appropriate): Sitting Oxygen Therapy SpO2: 98 % O2 Device: Room Air Pain: Pain Assessment Pain Scale: 0-10 Pain Score: 3  Pain Location: Head  Rest breaks as needed  Therapy/Group: Individual Therapy  Willeen Cass [facility] 11/15/2019, 4:18 PM

## 2019-11-15 NOTE — Progress Notes (Signed)
Physical Therapy Session Note  Patient Details  Name: William Gibson MRN: [id number] Date of Birth: 1946/02/21  Today's Date: 11/15/2019 PT Individual Time: 0802-0859 PT Individual Time Calculation (min): 57 min   Short Term Goals: Week 1:  PT Short Term Goal 1 (Week 1): Pt will perform supine<>sit with CGA PT Short Term Goal 2 (Week 1): Pt will perform functional transfers using LRAD with CGA PT Short Term Goal 3 (Week 1): Pt will ambulate at least 147ft using LRAD with CGA PT Short Term Goal 4 (Week 1): Pt will ascend/descend 4 steps using HRs per home set-up with CGA  Skilled Therapeutic Interventions/Progress Updates:  Pt received in bed & agreeable to tx. Supine>sit with supervision and HOB elevated. Pt doffs non skid socks & dons socks & shoes sitting EOB with supervision. Gait in room with RW & CGA and pt stands at sink to brush & don dentures. Gait room>gym>dayroom>room with RW & close supervision<>intermittent CGA with forward trunk lean with cuing to ambulate within base of AD vs pushing it out in front. PT adjusted height of RW for improved positioning & upright posture. Berg Balance Test with pt scoring 32/56; educated pt on interpretation of score. Patient demonstrates increased fall risk as noted by score of 32/56 on Berg Balance Scale.  (<36= high risk for falls, close to 100%; 37-45 significant >80%; 46-51 moderate >50%; 52-55 lower >25%). Gait without AD & heavy min assist with pt demonstrating anterior lean but less than yesterday, impaired foot clearance, impaired step length & width (L>RLE), impaired weight shifting & impaired balance. PT provides cuing for increased heel strike but pt with difficulty & impaired balance when attempting. Retrograde gait x 40 ft x 2 without AD & min assist with cuing for increased foot clearance with pt demonstrating less LOB with decreased speed of movement. At end of session pt left in w/c with chair alarm donned, call bell in reach & telesitter in  room.  During session, PT offered to assist pt with breakfast & offered him coffee but pt declines. PT encourages pt to eat/drink as much as possible.   Therapy Documentation Precautions:  Precautions Precautions: Fall, Other (comment) Precaution Comments: forward trunk flexion in standing/gait Restrictions Weight Bearing Restrictions: No  Pain: Pt c/o 4/10 HA but states he received pain meds prior to PT session.  Balance: Balance Balance Assessed: Yes Standardized Balance Assessment Standardized Balance Assessment: Berg Balance Test Berg Balance Test Sit to Stand: Needs minimal aid to stand or to stabilize (stabilizes with back of BLE on mat table) Standing Unsupported: Able to stand 2 minutes with supervision Sitting with Back Unsupported but Feet Supported on Floor or Stool: Able to sit safely and securely 2 minutes Stand to Sit: Sits safely with minimal use of hands Transfers: Able to transfer with verbal cueing and /or supervision Standing Unsupported with Eyes Closed: Able to stand 10 seconds with supervision Standing Ubsupported with Feet Together: Able to place feet together independently and stand for 1 minute with supervision From Standing, Reach Forward with Outstretched Arm: Can reach forward >12 cm safely (5") From Standing Position, Pick up Object from Floor: Able to pick up shoe, needs supervision From Standing Position, Turn to Look Behind Over each Shoulder: Needs supervision when turning (but is able to turn to both sides & shift weight, just requires supervision for safety) Turn 360 Degrees: Needs close supervision or verbal cueing Standing Unsupported, Alternately Place Feet on Step/Stool: Able to complete >2 steps/needs minimal assist Standing Unsupported,  One Foot in Front: Able to take small step independently and hold 30 seconds Standing on One Leg: Tries to lift leg/unable to hold 3 seconds but remains standing independently Total Score:  32     Therapy/Group: Individual Therapy  [name] 11/15/2019, 9:07 AM

## 2019-11-15 NOTE — Progress Notes (Signed)
Patient ID: William Gibson, male   DOB: 1945-10-19, 74 y.o.   MRN: [id number]  Per Nursing Supervisor, Verdis Frederickson, pt wife requested SW to call to discuss adding an additional day of therapy.  SW left message for pt wife [name] [phone]) to discuss family education. SW waiting on follow-up.   Loralee Pacas, MSW, [place] Office: [phone] Cell: [phone] Fax: [phone]

## 2019-11-16 ENCOUNTER — Inpatient Hospital Stay (HOSPITAL_COMMUNITY): Payer: 59 | Admitting: Occupational Therapy

## 2019-11-16 ENCOUNTER — Inpatient Hospital Stay (HOSPITAL_COMMUNITY): Payer: 59

## 2019-11-16 ENCOUNTER — Encounter (HOSPITAL_COMMUNITY): Payer: 59 | Admitting: Speech Pathology

## 2019-11-16 LAB — BASIC METABOLIC PANEL
Anion gap: 8 (ref 5–15)
BUN: 13 mg/dL (ref 8–23)
CO2: 23 mmol/L (ref 22–32)
Calcium: 9 mg/dL (ref 8.9–10.3)
Chloride: 102 mmol/L (ref 98–111)
Creatinine, Ser: 0.91 mg/dL (ref 0.61–1.24)
GFR calc Af Amer: >60 mL/min (ref >60)
GFR calc non Af Amer: >60 mL/min (ref >60)
Glucose, Bld: 101 mg/dL — ABNORMAL HIGH (ref 70–99)
Potassium: 3.7 mmol/L (ref 3.5–5.1)
Sodium: 133 mmol/L — ABNORMAL LOW (ref 135–145)

## 2019-11-16 LAB — PREALBUMIN: Prealbumin: 16.9 mg/dL — ABNORMAL LOW (ref 18–38)

## 2019-11-16 MED ORDER — TOPIRAMATE 25 MG PO TABS
50.0000 mg | ORAL_TABLET | Freq: Two times a day (BID) | ORAL | Status: DC
  Administered 2019-11-16 – 2019-11-23 (×14): 50 mg via ORAL
  Filled 2019-11-16 (×14): qty 2

## 2019-11-16 MED ORDER — SORBITOL 70 % SOLN
60.0000 mL | Status: AC
  Administered 2019-11-16: 60 mL via ORAL
  Filled 2019-11-16: qty 60

## 2019-11-16 MED ORDER — MEGESTROL ACETATE 400 MG/10ML PO SUSP
400.0000 mg | Freq: Two times a day (BID) | ORAL | Status: DC
  Administered 2019-11-16 – 2019-11-23 (×15): 400 mg via ORAL
  Filled 2019-11-16 (×15): qty 10

## 2019-11-16 MED ORDER — TOPIRAMATE 25 MG PO TABS
25.0000 mg | ORAL_TABLET | Freq: Once | ORAL | Status: DC
  Filled 2019-11-16: qty 1

## 2019-11-16 NOTE — Progress Notes (Signed)
Modified Barium Swallow Progress Note  Patient Details  Name: William Gibson MRN: [id number] Date of Birth: August 21, 1945  Today's Date: 11/16/2019  Modified Barium Swallow completed.  Full report located under Chart Review in the Imaging Section.  Brief recommendations include the following:  Clinical Impression  Patient's swallowing function has improved since initial MBS but patient continues to demonstrate a mild oropharyngeal residue.  Oral phase is characterized by lingual residue across all consistencies. Pharyngeal phase is characterized by a delayed swallow initiation and mild vallecular residue that patient independently clears with a second swallow. Patient with 2 episodes of deep, trace silent penetration of thin liquids via cup, suspect due to hyperextension of neck. Penetration events were eliminated with liquids via straw assisting with head remaining in a neutral position. Recommend patient upgrade to Dys. 3 textures with thin liquids via straw and continue full supervision for utilization of swallowing compensatory strategies.   Swallow Evaluation Recommendations       SLP Diet Recommendations: Dysphagia 3 (Mech soft) solids;Thin liquid   Liquid Administration via: Cup;Straw   Medication Administration: Crushed with puree   Supervision: Patient able to self feed;Full supervision/cueing for compensatory strategies   Compensations: Slow rate;Small sips/bites;Minimize environmental distractions;Effortful swallow   Postural Changes: Seated upright at 90 degrees   Oral Care Recommendations: Oral care BID     Weston Anna, MA, [place]    Eryanna Regal 11/16/2019,12:03 PM

## 2019-11-16 NOTE — Progress Notes (Signed)
Physical Therapy Session Note  Patient Details  Name: William Gibson MRN: [id number] Date of Birth: 10-21-1945  Today's Date: 11/16/2019 PT Individual Time: 1350-1500 PT Individual Time Calculation (min): 70 min   Short Term Goals: Week 1:  PT Short Term Goal 1 (Week 1): Pt will perform supine<>sit with CGA PT Short Term Goal 2 (Week 1): Pt will perform functional transfers using LRAD with CGA PT Short Term Goal 3 (Week 1): Pt will ambulate at least 176ft using LRAD with CGA PT Short Term Goal 4 (Week 1): Pt will ascend/descend 4 steps using HRs per home set-up with CGA  Skilled Therapeutic Interventions/Progress Updates:    Handoff from NT for toileting needs. Functional gait training on unit with RW with close supervision to CGA > 150' with intermittent assist during turns/obstacle negotiation or when speed increases occurred. Cues for upright posture and safety. Without AD, performed gait with CGA to occasional min assist for mild LOB's especially as speed increased, trunk flexion also increased and shuffled gait pattern.   NMR to address balance retraining, coordination, and postural control while in standing on Kinetron progressing to performing functional reaching task without UE support (initially with mirror for visual feedback and then progressed without visual feedback).  NMR during gait without AD to challenge balance and focus on coordination and balance while performing turning, toe taps during gait, and then stepping over cones during gait (trials x 2 each with R and L LLE). Balance retraining ad heel cord stretching while on foam wedge (increased angle) with cues for upright posture through trunk without UE support and then progressed to dynamic task while on airex pad to toss/catch beach ball. Overall min assist except 2 episodes of mod assist to recover from LOB anteriorly during ball toss activity.  Administered TUG and FTSS for fall risk assessment (see below):   TUG = 20.67  sec (without AD) with average of 3 trials   Five times Sit to Stand Test (FTSS)  Method:  Use a straight back chair with a solid seat that is 16-18" high. Ask participant to sit on the chair with arms folded across their chest.    Instructions:  "Stand up and sit down as quickly as possible 5 times, keeping your arms folded across your chest."    Measurement:  Stop timing when the participant stands the 5th time.   TIME: __21____ (in seconds)   Times > 13.6 seconds is associated with increased disability and morbidity (Guralnik, 2000)  Times > 15 seconds is predictive of recurrent falls in healthy individuals aged 38 and older (Buatois, et al., 2008)  Normal performance values in community dwelling individuals aged 29 and older (Bohannon, 2006): o 60-69 years: 11.4 seconds o 70-79 years: 12.6 seconds o 80-89 years: 14.8 seconds   MCID: ? 2.3 seconds for Vestibular Disorders (Meretta, 2006) End of session pt requesting to use bathroom again with continent BM. Performed gait in and out of bathroom without AD with min assist, more flexed posture as fatigued and decreased foot clearance. Close supervision for dynamic standing balance for hand hygiene and transfer back to w/c. Handoff to next OT for session.  Therapy Documentation Precautions:  Precautions Precautions: Fall, Other (comment) Precaution Comments: forward trunk flexion in standing/gait Restrictions Weight Bearing Restrictions: No  Pain:  c/o headache - 4/10 and premedicated    Therapy/Group: Individual Therapy  Canary Brim Ivory Broad, PT, DPT, CBIS  11/16/2019, 3:09 PM

## 2019-11-16 NOTE — Progress Notes (Signed)
Occupational Therapy Session Note  Patient Details  Name: William Gibson MRN: [id number] Date of Birth: 11-06-1945  Today's Date: 11/16/2019 OT Individual Time: 1047-1130 OT Individual Time Calculation (min): 43 min   Short Term Goals: Week 1:  OT Short Term Goal 1 (Week 1): Pt will stand at sink wiht MIN A for grooming to demo improved balance OT Short Term Goal 2 (Week 1): pt will complete ambualtory transfer to toilet iwht MIN A and LRAD OT Short Term Goal 3 (Week 1): Pt will sequence bathing 10/10 body parts with min VC  Skilled Therapeutic Interventions/Progress Updates:    Pt greeted semi-reclined in bed and agreeable to OT treatment session. Pt declined bathing and dressing this am but agreeable to go to the gym. Pt came to sitting EOB with supervision. PT then able to don shoes with set-up assist including tying shoes. Pt needed verbal cues for safe sit<>stand w/ RW pushing off of bed. PT then ambulated to therapy gym with RW and close supervision. Pt needed intermittent CGA for balance when turning corners and min cues for safety. Worked on standing balance/endurance with standing on foam block and sorting cards. OT had pt reach outside base of support to collect cards. Also incorporated crossing midline to place cards. Fine motor activity with trunk rotation using graded clothes pin task with clothes pins placed behind pt on mat. Pt with intermittent LOB requiring min A to correct. Pt ambulated back to room and was able to find his way back to room without cues. Pt left seated in wc with alarm belt on, call bell in reach, and needs met.   Therapy Documentation Precautions:  Precautions Precautions: Fall, Other (comment) Precaution Comments: forward trunk flexion in standing/gait Restrictions Weight Bearing Restrictions: No Pain: Pain Assessment Pain Scale: 0-10 Pain Score: 4  Pain Type: Chronic pain Pain Location: Head Pain Orientation: Anterior Pain Descriptors / Indicators:  Headache Pain Onset: On-going Patients Stated Pain Goal: 1 Pain Intervention(s): Repositioned, rest   Therapy/Group: Individual Therapy  Valma Cava 11/16/2019, 11:25 AM

## 2019-11-16 NOTE — Progress Notes (Addendum)
[facility] PHYSICAL MEDICINE & REHABILITATION PROGRESS NOTE   Subjective/Complaints: States he still has a dull, constant headache. Complains of being constipated yet he is not eating much because he doesn't like consistency of the food.   ROS: Patient denies fever, rash, sore throat, blurred vision, nausea, vomiting, diarrhea, cough, shortness of breath or chest pain, joint or back pain,   or mood change.     Objective:   No results found. Recent Labs    11/13/19 1105  WBC 8.4  HGB 14.4  HCT 42.7  PLT 270   Recent Labs    11/13/19 1105 11/16/19 0551  NA 134* 133*  K 4.3 3.7  CL 100 102  CO2 24 23  GLUCOSE 111* 101*  BUN 16 13  CREATININE 0.86 0.91  CALCIUM 9.4 9.0    Intake/Output Summary (Last 24 hours) at 11/16/2019 0928 Last data filed at 11/16/2019 0748 Gross per 24 hour  Intake 280 ml  Output 500 ml  Net -220 ml     Physical Exam: Vital Signs Blood pressure 130/84, pulse 72, temperature 98.4 F (36.9 C), resp. rate 18, height 5\' 9"  (1.753 m), weight 72.2 kg, SpO2 97 %.  Constitutional: No distress . Vital signs reviewed. HEENT: EOMI, oral membranes moist Neck: supple Cardiovascular: RRR without murmur. No JVD    Respiratory/Chest: CTA Bilaterally without wheezes or rales. Normal effort    GI/Abdomen: BS +, non-tender, non-distended Ext: no clubbing, cyanosis, or edema Psych: irritable Skin: Clean and intact without signs of breakdown, prominent central suture no skin lesions on scalp, seborrheic dermatitis on face Neuro: normal language, poor insight. Oriented to person not place. Moves all 4's. Senses pain in all 4's.  Musculoskeletal: Full ROM, No pain with AROM or PROM in the neck, trunk, or extremities.        Assessment/Plan: 1. Functional deficits secondary to TBI which require 3+ hours per day of interdisciplinary therapy in a comprehensive inpatient rehab setting.  Physiatrist is providing close team supervision and 24 hour management of  active medical problems listed below.  Physiatrist and rehab team continue to assess barriers to discharge/monitor patient progress toward functional and medical goals  Care Tool:  Bathing  Bathing activity did not occur: Refused Body parts bathed by patient: Right arm, Left arm, Chest, Abdomen, Front perineal area, Buttocks, Right upper leg, Left upper leg, Right lower leg, Left lower leg, Face         Bathing assist Assist Level: Minimal Assistance - Patient > 75%     Upper Body Dressing/Undressing Upper body dressing   What is the patient wearing?: Pull over shirt    Upper body assist Assist Level: Minimal Assistance - Patient > 75%    Lower Body Dressing/Undressing Lower body dressing      What is the patient wearing?: Pants     Lower body assist Assist for lower body dressing: Minimal Assistance - Patient > 75%     Toileting Toileting    Toileting assist Assist for toileting: Minimal Assistance - Patient > 75%     Transfers Chair/bed transfer  Transfers assist     Chair/bed transfer assist level: Contact Guard/Touching assist     Locomotion Ambulation   Ambulation assist      Assist level: Contact Guard/Touching assist Assistive device: Walker-rolling Max distance: 150 ft   Walk 10 feet activity   Assist     Assist level: Contact Guard/Touching assist Assistive device: Walker-rolling   Walk 50 feet activity   Assist  Assist level: Contact Guard/Touching assist Assistive device: Walker-rolling    Walk 150 feet activity   Assist    Assist level: Contact Guard/Touching assist Assistive device: Walker-rolling    Walk 10 feet on uneven surface  activity   Assist     Assist level: Minimal Assistance - Patient > 75% Assistive device: Other (comment) (railing on ramp)   Wheelchair     Assist Will patient use wheelchair at discharge?: No             Wheelchair 50 feet with 2 turns activity    Assist             Wheelchair 150 feet activity     Assist          Blood pressure 130/84, pulse 72, temperature 98.4 F (36.9 C), resp. rate 18, height 5\' 9"  (1.753 m), weight 72.2 kg, SpO2 97 %.  Medical Problem List and Plan: 1.  Balance deficits, flexed posture, shuffling gait, cognitive deficits, impairments in attention, dysphagia secondary to TBI with bilateral SAH. Per wife pt with increased HA and dizziness over the weekend no falls no blood thinners              -patient may shower             -ELOS/Goals: 7-10 days/supervision             -Patient continues CIR therapies  including PT, OT, and SLP    -recent CT demonstrates evolution of SDH, SAH, contusions and not "strokes" as identified on report. 2.  Antithrombotics: -DVT/anticoagulation:  Mechanical: Sequential compression devices, below knee Bilateral lower extremities             -antiplatelet therapy: N/A 3. Persistent Headaches/Pain Management: Getting a little better --continue Oxycodone prn.               -plan to transition to nonnarcotics given history.  -topamax trial for headaches 25mg  bid started 7/19 with some benefit 4. Mood: LCSW to follow for evaluation and support.              -antipsychotic agents: N/A  -continue sleep chart 5. Neuropsych: This patient is not capable of making decisions on his own behalf. 6. Skin/Wound Care: Routine Routine pressure relief measures.  7. Fluids/Electrolytes/Nutrition: doesn't like diet, has sore throat  -7/22 bmet relatively normal, prealbumin only 16.    -he's drinking fairly well   - I discussed importance of po with patient who mostly disregarded   -will begin trial of megace   -follow up labs next week. 8. Tobacco abuse and Alcohol abuse with DTs: resumed thiamine and folic acid.   -clonidine tapered off--BP stable.               -pt reports that he has a daily whiskey and coke at home. 9.  Dysphagia: Diet modified to dysphagia 1, thin liquids on 07/16.              Advance diet as tolerated per SLP 10. H/o RA: Managed with Meloxicam--changed to low dose prednisone, 1mg  11. Hyponatremia/Hypomagnesemia: Likely due to ETOH abuse.              sodium 134 7/16 12. Hepatic steatosis/Chronic pancreatitis: Continue Thiamine and Folic acid.              LFT's nl 7/19    LOS: 6 days A FACE TO FACE EVALUATION WAS PERFORMED  Meredith Staggers 11/16/2019, 9:28 AM

## 2019-11-16 NOTE — Progress Notes (Signed)
Occupational Therapy Session Note  Patient Details  Name: JOSEDEJESUS MARCUM MRN: [id number] Date of Birth: 31-Dec-1945  Today's Date: 11/16/2019 OT Individual Time: 1300-1330 OT Individual Time Calculation (min): 30 min    Short Term Goals: Week 1:  OT Short Term Goal 1 (Week 1): Pt will stand at sink wiht MIN A for grooming to demo improved balance OT Short Term Goal 2 (Week 1): pt will complete ambualtory transfer to toilet iwht MIN A and LRAD OT Short Term Goal 3 (Week 1): Pt will sequence bathing 10/10 body parts with min VC  Skilled Therapeutic Interventions/Progress Updates:    Patient seated in w/c, alert and ready for therapy session.   Ambulation with RW to/from therapy gym CS.  Completed dynavision activity in stance - 2 minute, whole screen:  Trial 1 with right hand = 2.14 sec, trial 2 with left hand = 1.88 sec.  Upon return to room he was able to relay activity to wife.  No LOB, appropriate behavior t/o session.  He returned to the w/c, seat belt alarm set, telesitter and call bell in reach.    Therapy Documentation Precautions:  Precautions Precautions: Fall, Other (comment) Precaution Comments: forward trunk flexion in standing/gait Restrictions Weight Bearing Restrictions: No   Therapy/Group: Individual Therapy  Carlos Levering 11/16/2019, 7:45 AM

## 2019-11-16 NOTE — Plan of Care (Signed)
  Problem: Consults Goal: RH BRAIN INJURY PATIENT EDUCATION Description: Description: See Patient Education module for eduction specifics Outcome: Progressing Goal: Skin Care Protocol Initiated - if Braden Score 18 or less Description: If consults are not indicated, leave blank or document N/A Outcome: Progressing Goal: Nutrition Consult-if indicated Outcome: Progressing Goal: Diabetes Guidelines if Diabetic/Glucose > 140 Description: If diabetic or lab glucose is > 140 mg/dl - Initiate Diabetes/Hyperglycemia Guidelines & Document Interventions  Outcome: Progressing   Problem: RH BOWEL ELIMINATION Goal: RH STG MANAGE BOWEL WITH ASSISTANCE Description: STG Manage Bowel with Assistance. Outcome: Progressing Goal: RH STG MANAGE BOWEL W/MEDICATION W/ASSISTANCE Description: STG Manage Bowel with Medication with Assistance. Outcome: Progressing   Problem: RH BLADDER ELIMINATION Goal: RH STG MANAGE BLADDER WITH ASSISTANCE Description: STG Manage Bladder With Assistance Outcome: Progressing Goal: RH STG MANAGE BLADDER WITH MEDICATION WITH ASSISTANCE Description: STG Manage Bladder With Medication With Assistance. Outcome: Progressing Goal: RH STG MANAGE BLADDER WITH EQUIPMENT WITH ASSISTANCE Description: STG Manage Bladder With Equipment With Assistance Outcome: Progressing   Problem: RH SKIN INTEGRITY Goal: RH STG SKIN FREE OF INFECTION/BREAKDOWN Description: With moderate assistance. Outcome: Progressing   Problem: RH SAFETY Goal: RH STG ADHERE TO SAFETY PRECAUTIONS W/ASSISTANCE/DEVICE Description: STG Adhere to Safety Precautions With Assistance/Device. Outcome: Progressing

## 2019-11-17 ENCOUNTER — Inpatient Hospital Stay (HOSPITAL_COMMUNITY): Payer: 59 | Admitting: Occupational Therapy

## 2019-11-17 ENCOUNTER — Inpatient Hospital Stay (HOSPITAL_COMMUNITY): Payer: 59 | Admitting: Physical Therapy

## 2019-11-17 ENCOUNTER — Inpatient Hospital Stay (HOSPITAL_COMMUNITY): Payer: 59 | Admitting: Speech Pathology

## 2019-11-17 DIAGNOSIS — G441 Vascular headache, not elsewhere classified: Secondary | ICD-10-CM

## 2019-11-17 DIAGNOSIS — S069X0S Unspecified intracranial injury without loss of consciousness, sequela: Secondary | ICD-10-CM

## 2019-11-17 DIAGNOSIS — R1310 Dysphagia, unspecified: Secondary | ICD-10-CM

## 2019-11-17 NOTE — Progress Notes (Signed)
Occupational Therapy Weekly Progress Note  Patient Details  Name: William Gibson MRN: [id number] Date of Birth: August 08, 1945  Beginning of progress report period: November 11, 2019 End of progress report period: November 17, 2019  Today's Date: 11/17/2019 OT Individual Time: 1000-1115 OT Individual Time Calculation (min): 75 min    Patient has met 3 of 3 short term goals.  Pt is making steady progress towards OT goals. Pt is at an overall min/CGA level for BADL Tasks with improved activity tolerance, balance, and awareness. Pt is ambulating with RW at supervision/CGA within room for BADL Tasks. Continue current POC.  Patient continues to demonstrate the following deficits: muscle weakness, decreased awareness, decreased problem solving, decreased safety awareness and decreased memory and decreased sitting balance, decreased standing balance, decreased postural control and decreased balance strategies and therefore will continue to benefit from skilled OT intervention to enhance overall performance with BADL.  Patient progressing toward long term goals..  Continue plan of care.  OT Short Term Goals Week 1:  OT Short Term Goal 1 (Week 1): Pt will stand at sink wiht MIN A for grooming to demo improved balance OT Short Term Goal 1 - Progress (Week 1): Met OT Short Term Goal 2 (Week 1): pt will complete ambualtory transfer to toilet iwht MIN A and LRAD OT Short Term Goal 2 - Progress (Week 1): Met OT Short Term Goal 3 (Week 1): Pt will sequence bathing 10/10 body parts with min VC OT Short Term Goal 3 - Progress (Week 1): Met Week 2:  OT Short Term Goal 1 (Week 2): LTG=STG 2/2 ELOS  Skilled Therapeutic Interventions/Progress Updates:    Pt greeted semi-reclined in bed and agreeable to OT treatment session. Pt agreeable to shower this morning. Pt came to sitting EOB with supervision. He needed verbal cues to push up from bed and not pull up on RW to stand, but overall CGA to come to standing. Pt then  ambulated into bathroom w/ RW and close supervision. Pt needed CGA when turning to place RW over commode to urinate. Pt went to pull socks off in standing. OT educated on high falls risk and need to sit down to step out of pants and remove socks. Pt sat down on commode to complete these tasks, then ambulated into shower without AD and CGA. Bathing completed with overall supervision/set-up A. CGA for ambulating out of bathroom w/ RW. Dressing from wc with set-up A and CGA for balance when standing to pull up pants. Worked on standing balance/endurance with standing grooming tasks at the sink w/ overall supervision. Pt ambulated to dayroom and completed 10 mins on [employer] arm bike on level 4. Worked on dynamic balance with functional ambulation without AD while completed obstacle course holding onto medium exercise ball. Ball toss seated on therapy mat with oblique twist for coordination, balance, and core strengthening. Pt returned to room at end of session and left seated in wc with alarm belt on, call bell in reach, and needs met.   Therapy Documentation Precautions:  Precautions Precautions: Fall, Other (comment) Precaution Comments: forward trunk flexion in standing/gait Restrictions Weight Bearing Restrictions: No Pain: Pain Assessment Pain Scale: 0-10 Pain Score: 0-No pain   Therapy/Group: Individual Therapy  Valma Cava 11/17/2019, 10:35 AM

## 2019-11-17 NOTE — Progress Notes (Signed)
Physical Therapy Weekly Progress Note  Patient Details  Name: William Gibson MRN: [id number] Date of Birth: 10/05/1945  Beginning of progress report period: November 11, 2019 End of progress report period: November 17, 2019  Today's Date: 11/17/2019 PT Individual Time: 0626-9485 PT Individual Time Calculation (min): 55 min   Patient has met 3 of 4 short term goals.  Pt is making good progress towards LTG's as he currently requires supervision<>CGA for gait with RW & can negotiate stairs with B rails & CGA. Pt would benefit from ongoing skilled PT treatment to focus on balance & safety awareness, progress independence during functional mobility with LRAD & for pt/family education prior to d/c.  Patient continues to demonstrate the following deficits muscle weakness, decreased cardiorespiratoy endurance, decreased coordination, decreased attention, decreased awareness, decreased problem solving, decreased safety awareness and decreased memory, and decreased standing balance, decreased postural control and decreased balance strategies and therefore will continue to benefit from skilled PT intervention to increase functional independence with mobility.  Patient progressing toward long term goals..  Continue plan of care.  PT Short Term Goals Week 1:  PT Short Term Goal 1 (Week 1): Pt will perform supine<>sit with CGA PT Short Term Goal 1 - Progress (Week 1): Met PT Short Term Goal 2 (Week 1): Pt will perform functional transfers using LRAD with CGA PT Short Term Goal 2 - Progress (Week 1): Met PT Short Term Goal 3 (Week 1): Pt will ambulate at least 155f using LRAD with CGA PT Short Term Goal 3 - Progress (Week 1): Met PT Short Term Goal 4 (Week 1): Pt will ascend/descend 4 steps using HRs per home set-up with CGA PT Short Term Goal 4 - Progress (Week 1): Other (comment) (adequate to meet goal - pt negotiates 24 steps with B rails & CGA, unsure of rail set up at home) Week 2:  PT Short Term Goal 1  (Week 2): STG = LTG due to estimated d/c date.  Skilled Therapeutic Interventions/Progress Updates:  Pt received in w/c & agreeable to tx. Pt reports "slight HA" but declines pain meds at this time. Sit<>stand transfers throughout session with close supervision with cuing for safe hand placement as pt frequently sits without reaching back. Gait throughout unit with RW & close supervision with education to ambulate within base of AD. Gait weaving between cones focusing on ambulating within base of AD during turns with good demo by pt. Pt negotiates 24 steps (3" + 6") with B rails and CGA. Pt performs trampoline ball toss while standing on foam with task focusing on dynamic balance with pt requiring CGA<>min assist. Pt transferred to mat table & performs bird dog exercises with tactile/verbal cuing for technique with focus on core/trunk control and NMR. Kinetron in sitting then standing with BUE support>no UE support with mirror for visual feedback & min assist with cuing for upright posture as pt demonstrates forward trunk lean & flexed hips & knees; activity focused on dynamic balance, BLE strengthening, weight shifting L<>R & NMR. Pt stands on foam & attempts to perform PNF patterns with theraball for balance training but pt with difficulty following commands despite multimodal cuing and hand over hand assist. At end of session pt left in w/c with chair alarm donned, call bell in reach, telesitter in room.  Therapy Documentation Precautions:  Precautions Precautions: Fall, Other (comment) Precaution Comments: forward trunk flexion in standing/gait Restrictions Weight Bearing Restrictions: No   Therapy/Group: Individual Therapy  VWaunita Schooner7/23/2021, 3:34 PM

## 2019-11-17 NOTE — Progress Notes (Signed)
Speech Language Pathology Weekly Progress and Session Note  Patient Details  Name: William Gibson MRN: [id number] Date of Birth: 03-08-1946  Beginning of progress report period: November 10, 2019 End of progress report period: November 17, 2019  Today's Date: 11/17/2019 SLP Individual Time: 0725-0825 SLP Individual Time Calculation (min): 60 min  Short Term Goals: Week 1: SLP Short Term Goal 1 (Week 1): Patient will consume current diet with minimal overt s/s of aspiration and Min A verbal cues for use of swallowing compensatory strategies. SLP Short Term Goal 1 - Progress (Week 1): Met SLP Short Term Goal 2 (Week 1): Patient will perform pharyngeal strengthening exercises with Min verbal cues for accuracy. SLP Short Term Goal 2 - Progress (Week 1): Met SLP Short Term Goal 3 (Week 1): Patient will utilize speech intelligibility strategies to achieve ~90% intelligibility at the sentence level with Min verbal cues. SLP Short Term Goal 3 - Progress (Week 1): Met SLP Short Term Goal 4 (Week 1): Patient will demonstrate functional problem solving for basic and familiar tasks with Min A verbal cues. SLP Short Term Goal 4 - Progress (Week 1): Met SLP Short Term Goal 5 (Week 1): Patient will recall new, daily information with Mod A verbal and visual cues. SLP Short Term Goal 5 - Progress (Week 1): Met SLP Short Term Goal 6 (Week 1): Patient will demonstrate selective attention to functional tasks in a mildly distracting enviornment for 30 minutes with Min verbal cues for redirection. SLP Short Term Goal 6 - Progress (Week 1): Met    New Short Term Goals: Week 2: SLP Short Term Goal 1 (Week 2): STGs=LTGs due to ELOS  Weekly Progress Updates: Patient has made excellent gains and has met 6 of 6 STGs this reporting period. Currently, patient demonstrates behaviors consistent with a Rancho Level VI-emerging VII and requires overall Min-Mod A verbal cues for recall of daily information, selective attention,  and problem solving. Patient had a repeat MBS yesterday with improved swallowing function, therefore, patient was upgraded to Dys. 3 textures with thin liquids. Patient also demonstrates improved speech intelligibility and is 100% intelligible at the sentence level. Patient and family education ongoing. Patient would benefit from continued skilled SLP intervention to maximize his cognitive and swallowing function prior to discharge.      Intensity: Minumum of 1-2 x/day, 30 to 90 minutes Frequency: 3 to 5 out of 7 days Duration/Length of Stay: 11/23/19 Treatment/Interventions: Cognitive remediation/compensation;Dysphagia/aspiration precaution training;Internal/external aids;Speech/Language facilitation;Therapeutic Activities;Environmental controls;Cueing hierarchy;Functional tasks;Patient/family education   Daily Session  Skilled Therapeutic Interventions: Skilled treatment session focused on dysphagia and cognitive goals. SLP facilitated session by providing skilled observation with breakfast meal of Dys. 3 textures with thin liquids. Patient consumed meal without overt s/s of aspiration and was Mod I for tray set-up and for use of swallowing compensatory strategies. Recommend patient continue current diet. SLP also facilitated session by providing supervision level verbal cues for use recall of the functions of his current medications and will attempt to organize a BID pill box during next session. Patient was also overall Mod I for problem solving during a basic money management task. Patient left upright in bed with alarm on and all needs within reach. Continue with current plan of care.      Pain No/Denies Pain   Therapy/Group: Individual Therapy  Egan Berkheimer 11/17/2019, 6:48 AM

## 2019-11-17 NOTE — Progress Notes (Signed)
[place] PHYSICAL MEDICINE & REHABILITATION PROGRESS NOTE   Subjective/Complaints: Patient seen sitting up in bed this morning, working with therapies.  He states he slept on and off overnight.  Discussed headaches with patient and therapies.  ROS: Denies CP, SOB, N/V/D  Objective:   DG Swallowing Func-Speech Pathology  Result Date: 11/16/2019 Objective Swallowing Evaluation: Type of Study: MBS-Modified Barium Swallow Study  Patient Details Name: KENDREW PACI MRN: [id number] Date of Birth: Jan 22, 1946 Today's Date: 11/16/2019 Time: SLP Start Time (ACUTE ONLY): 7829 -SLP Stop Time (ACUTE ONLY): 0930 SLP Time Calculation (min) (ACUTE ONLY): 25 min Past Medical History: Past Medical History: Diagnosis Date . Abnormal liver function tests 12/15/2018 . Alcoholic liver disease ([place]) 12/15/2018 . Alcoholism ([place])  . Elevated LDL cholesterol level 05/22/2013 . Epistaxis  . Erectile dysfunction 05/22/2013 . Frequent falls 11/09/2018 . GERD (gastroesophageal reflux disease)  . Hyperammonemia ([place]) 11/15/2018 . Memory change 08/17/2017 . RA (rheumatoid arthritis) ([place]) 08/17/2017 . RBBB 12/15/2018 . Rheumatoid arthritis ([place]) 2010 . Shuffling gait 12/19/2018 . Tobacco abuse 04/26/2012 . Weight loss, unintentional 11/09/2018 . 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 NASAL ENDOSCOPY WITH EPISTAXIS CONTROL N/A 04/12/2018  Procedure: NASAL ENDOSCOPY WITH EPISTAXIS CONTROL;  Surgeon: Melissa Montane, MD;  Location: [facility];  Service: ENT;  Laterality: N/A; . VASECTOMY   . VASECTOMY REVERSAL   HPI: See H&P  Subjective: alert, cooperative, says his throat hurts Assessment / Plan / Recommendation CHL IP CLINICAL IMPRESSIONS 11/16/2019 Clinical Impression Patient's swallowing function has improved since initial MBS but patient continues to demonstrate a mild oropharyngeal residue.  Oral phase is characterized by lingual residue across all  consistencies. Pharyngeal phase is characterized by a delayed swallow initiation and mild vallecular residue that patient independently clears with a second swallow. Patient with 2 episodes of deep, trace silent penetration of thin liquids via cup, suspect due to hyperextension of neck. Penetration events were eliminated with liquids via straw assisting with head remaining in a neutral position. Recommend patient upgrade to Dys. 3 textures with thin liquids via straw and continue full supervision for utilization of swallowing compensatory strategies. SLP Visit Diagnosis Dysphagia, oropharyngeal phase (R13.12) Attention and concentration deficit following -- Frontal lobe and executive function deficit following -- Impact on safety and function Mild aspiration risk   CHL IP TREATMENT RECOMMENDATION 11/16/2019 Treatment Recommendations Therapy as outlined in treatment plan below   Prognosis 11/16/2019 Prognosis for Safe Diet Advancement Good Barriers to Reach Goals -- Barriers/Prognosis Comment -- CHL IP DIET RECOMMENDATION 11/16/2019 SLP Diet Recommendations Dysphagia 3 (Mech soft) solids;Thin liquid Liquid Administration via Cup;Straw Medication Administration Crushed with puree Compensations Slow rate;Small sips/bites;Minimize environmental distractions;Effortful swallow Postural Changes Seated upright at 90 degrees   CHL IP OTHER RECOMMENDATIONS 11/16/2019 Recommended Consults -- Oral Care Recommendations Oral care BID Other Recommendations --   CHL IP FOLLOW UP RECOMMENDATIONS 11/16/2019 Follow up Recommendations Inpatient Rehab   CHL IP FREQUENCY AND DURATION 11/16/2019 Speech Therapy Frequency (ACUTE ONLY) min 3x week Treatment Duration 2 weeks      CHL IP ORAL PHASE 11/16/2019 Oral Phase Impaired Oral - Pudding Teaspoon -- Oral - Pudding Cup -- Oral - Honey Teaspoon -- Oral - Honey Cup NT Oral - Nectar Teaspoon Lingual/palatal residue Oral - Nectar Cup Lingual/palatal residue Oral - Nectar Straw -- Oral - Thin  Teaspoon WFL Oral - Thin Cup Lingual/palatal residue Oral - Thin Straw Lingual/palatal residue Oral - Puree Lingual/palatal residue Oral -  Mech Soft Lingual/palatal residue;Impaired mastication Oral - Regular -- Oral - Multi-Consistency -- Oral - Pill -- Oral Phase - Comment --  CHL IP PHARYNGEAL PHASE 11/16/2019 Pharyngeal Phase Impaired Pharyngeal- Pudding Teaspoon -- Pharyngeal -- Pharyngeal- Pudding Cup -- Pharyngeal -- Pharyngeal- Honey Teaspoon -- Pharyngeal -- Pharyngeal- Honey Cup NT Pharyngeal -- Pharyngeal- Nectar Teaspoon Delayed swallow initiation-vallecula Pharyngeal -- Pharyngeal- Nectar Cup Delayed swallow initiation-vallecula Pharyngeal Material does not enter airway Pharyngeal- Nectar Straw -- Pharyngeal -- Pharyngeal- Thin Teaspoon Delayed swallow initiation-pyriform sinuses Pharyngeal Material does not enter airway Pharyngeal- Thin Cup Delayed swallow initiation-pyriform sinuses;Pharyngeal residue - valleculae;Penetration/Aspiration during swallow Pharyngeal Material does not enter airway;Material enters airway, remains ABOVE vocal cords and not ejected out;Material enters airway, CONTACTS cords and not ejected out Pharyngeal- Thin Straw Delayed swallow initiation-pyriform sinuses;Pharyngeal residue - valleculae Pharyngeal Material does not enter airway Pharyngeal- Puree Delayed swallow initiation-vallecula;Pharyngeal residue - valleculae Pharyngeal -- Pharyngeal- Mechanical Soft Delayed swallow initiation-vallecula;Pharyngeal residue - valleculae Pharyngeal Material does not enter airway Pharyngeal- Regular -- Pharyngeal -- Pharyngeal- Multi-consistency -- Pharyngeal -- Pharyngeal- Pill -- Pharyngeal -- Pharyngeal Comment --  CHL IP CERVICAL ESOPHAGEAL PHASE 11/10/2019 Cervical Esophageal Phase Impaired Pudding Teaspoon -- Pudding Cup -- Honey Teaspoon -- Honey Cup Reduced cricopharyngeal relaxation Nectar Teaspoon Reduced cricopharyngeal relaxation Nectar Cup Reduced cricopharyngeal relaxation  Nectar Straw -- Thin Teaspoon -- Thin Cup -- Thin Straw -- Puree Reduced cricopharyngeal relaxation Mechanical Soft Reduced cricopharyngeal relaxation Regular -- Multi-consistency -- Pill -- Cervical Esophageal Comment -- PAYNE, COURTNEY 11/16/2019, 12:04 PM    Weston Anna, MA, CCC-SLP [phone]           No results for input(s): WBC, HGB, HCT, PLT in the last 72 hours. Recent Labs    11/16/19 0551  NA 133*  K 3.7  CL 102  CO2 23  GLUCOSE 101*  BUN 13  CREATININE 0.91  CALCIUM 9.0    Intake/Output Summary (Last 24 hours) at 11/17/2019 1136 Last data filed at 11/16/2019 2005 Gross per 24 hour  Intake 250 ml  Output 0 ml  Net 250 ml     Physical Exam: Vital Signs Blood pressure (!) 126/86, pulse 71, temperature 97.8 F (36.6 C), resp. rate 20, height 5\' 9"  (1.753 m), weight 70.3 kg, SpO2 97 %. Constitutional: No distress . Vital signs reviewed. HENT: Normocephalic.  Atraumatic. Eyes: EOMI. No discharge. Cardiovascular: No JVD.  RRR.  Respiratory: Normal effort.  No stridor.  Bilateral clear to auscultation. GI: Non-distended.  BS +. Skin: Warm and dry.  Intact. Psych: Normal mood.  Normal behavior. Musc: No edema in extremities.  No tenderness in extremities. Neuro: Alert and oriented, needing cues for date. Motor: Grossly 5/5 throughout  Assessment/Plan: 1. Functional deficits secondary to TBI which require 3+ hours per day of interdisciplinary therapy in a comprehensive inpatient rehab setting.  Physiatrist is providing close team supervision and 24 hour management of active medical problems listed below.  Physiatrist and rehab team continue to assess barriers to discharge/monitor patient progress toward functional and medical goals  Care Tool:  Bathing  Bathing activity did not occur: Refused Body parts bathed by patient: Right arm, Left arm, Chest, Abdomen, Front perineal area, Buttocks, Right upper leg, Left upper leg, Face, Left lower leg, Right lower leg          Bathing assist Assist Level: Contact Guard/Touching assist     Upper Body Dressing/Undressing Upper body dressing   What is the patient wearing?: Pull over shirt    Upper body assist  Assist Level: Set up assist    Lower Body Dressing/Undressing Lower body dressing      What is the patient wearing?: Pants, Underwear/pull up     Lower body assist Assist for lower body dressing: Contact Guard/Touching assist     Toileting Toileting    Toileting assist Assist for toileting: Contact Guard/Touching assist     Transfers Chair/bed transfer  Transfers assist     Chair/bed transfer assist level: Contact Guard/Touching assist     Locomotion Ambulation   Ambulation assist      Assist level: Contact Guard/Touching assist Assistive device: Walker-rolling Max distance: 150 ft   Walk 10 feet activity   Assist     Assist level: Contact Guard/Touching assist Assistive device: Walker-rolling   Walk 50 feet activity   Assist    Assist level: Contact Guard/Touching assist Assistive device: Walker-rolling    Walk 150 feet activity   Assist    Assist level: Contact Guard/Touching assist Assistive device: Walker-rolling    Walk 10 feet on uneven surface  activity   Assist     Assist level: Minimal Assistance - Patient > 75% Assistive device: Other (comment) (railing on ramp)   Wheelchair     Assist Will patient use wheelchair at discharge?: No             Wheelchair 50 feet with 2 turns activity    Assist            Wheelchair 150 feet activity     Assist          Blood pressure (!) 126/86, pulse 71, temperature 97.8 F (36.6 C), resp. rate 20, height 5\' 9"  (1.753 m), weight 70.3 kg, SpO2 97 %.  Medical Problem List and Plan: 1.  Balance deficits, flexed posture, shuffling gait, cognitive deficits, impairments in attention, dysphagia secondary to TBI with bilateral SAH. Per wife pt with increased HA and dizziness  over the weekend no falls no blood thinners   Continue CIR  -recent CT demonstrates evolution of SDH, SAH, contusions and not "strokes" as identified on report. 2.  Antithrombotics: -DVT/anticoagulation:  Mechanical: Sequential compression devices, below knee Bilateral lower extremities             -antiplatelet therapy: N/A 3. Persistent Headaches/Pain Management: Oxycodone prn.               -plan to transition to nonnarcotics given history.  Topamax trial for headaches 25mg  bid started 7/19 with some benefit, increased to 50 BID 4. Mood: LCSW to follow for evaluation and support.              -antipsychotic agents: N/A  -continue sleep chart 5. Neuropsych: This patient is not capable of making decisions on his own behalf. 6. Skin/Wound Care: Routine Routine pressure relief measures.  7. Fluids/Electrolytes/Nutrition: doesn't like diet, has sore throat  -Trial of megace, ?improving on 7/23 8. Tobacco abuse and Alcohol abuse with DTs: resumed thiamine and folic acid.   -clonidine tapered off--BP stable.               -pt reports that he has a daily whiskey and coke at home. 9.  Dysphagia: Diet modified advanced to D3 thins.             Advance diet as tolerated per SLP 10. H/o RA: Managed with Meloxicam--changed to low dose prednisone, 1mg  11. Hyponatremia/Hypomagnesemia: Likely due to ETOH abuse.              Sodium  133 on 7/22, continue to monitor 12. Hepatic steatosis/Chronic pancreatitis: Continue Thiamine and Folic acid.              LFT's nl 7/19    LOS: 7 days A FACE TO FACE EVALUATION WAS PERFORMED  Corey Laski Lorie Phenix 11/17/2019, 11:36 AM

## 2019-11-18 DIAGNOSIS — G441 Vascular headache, not elsewhere classified: Secondary | ICD-10-CM

## 2019-11-18 DIAGNOSIS — R296 Repeated falls: Secondary | ICD-10-CM

## 2019-11-18 DIAGNOSIS — S069X0D Unspecified intracranial injury without loss of consciousness, subsequent encounter: Secondary | ICD-10-CM

## 2019-11-18 DIAGNOSIS — E871 Hypo-osmolality and hyponatremia: Secondary | ICD-10-CM

## 2019-11-18 DIAGNOSIS — R1310 Dysphagia, unspecified: Secondary | ICD-10-CM

## 2019-11-18 NOTE — Progress Notes (Signed)
[place] PHYSICAL MEDICINE & REHABILITATION PROGRESS NOTE   Subjective/Complaints: Patient seen laying in bed this morning.  He states he slept well overnight.,  Confirmed with sleep chart.  He denies complaints.  ROS: Denies CP, SOB, N/V/D  Objective:   No results found. No results for input(s): WBC, HGB, HCT, PLT in the last 72 hours. Recent Labs    11/16/19 0551  NA 133*  K 3.7  CL 102  CO2 23  GLUCOSE 101*  BUN 13  CREATININE 0.91  CALCIUM 9.0    Intake/Output Summary (Last 24 hours) at 11/18/2019 1552 Last data filed at 11/18/2019 1200 Gross per 24 hour  Intake 600 ml  Output --  Net 600 ml     Physical Exam: Vital Signs Blood pressure 125/81, pulse 80, temperature 98.1 F (36.7 C), resp. rate 18, height 5\' 9"  (1.753 m), weight 70.3 kg, SpO2 100 %. Constitutional: No distress . Vital signs reviewed. HENT: Normocephalic.  Atraumatic. Eyes: EOMI. No discharge. Cardiovascular: No JVD.  RRR. Respiratory: Normal effort.  No stridor.  Bilateral clear to auscultation GI: Non-distended.  BS +. Skin: Warm and dry.  Intact. Psych: Normal mood.  Normal behavior. Musc: No edema in extremities.  No tenderness in extremities. Neuro: Alert and oriented, needing cues for date. Motor: Grossly 5/5 throughout, unchanged  Assessment/Plan: 1. Functional deficits secondary to TBI which require 3+ hours per day of interdisciplinary therapy in a comprehensive inpatient rehab setting.  Physiatrist is providing close team supervision and 24 hour management of active medical problems listed below.  Physiatrist and rehab team continue to assess barriers to discharge/monitor patient progress toward functional and medical goals  Care Tool:  Bathing  Bathing activity did not occur: Refused Body parts bathed by patient: Right arm, Left arm, Chest, Abdomen, Front perineal area, Buttocks, Right upper leg, Left upper leg, Face, Left lower leg, Right lower leg         Bathing assist  Assist Level: Contact Guard/Touching assist     Upper Body Dressing/Undressing Upper body dressing   What is the patient wearing?: Pull over shirt    Upper body assist Assist Level: Set up assist    Lower Body Dressing/Undressing Lower body dressing      What is the patient wearing?: Pants, Underwear/pull up     Lower body assist Assist for lower body dressing: Contact Guard/Touching assist     Toileting Toileting    Toileting assist Assist for toileting: Contact Guard/Touching assist     Transfers Chair/bed transfer  Transfers assist     Chair/bed transfer assist level: Contact Guard/Touching assist     Locomotion Ambulation   Ambulation assist      Assist level: Supervision/Verbal cueing Assistive device: Walker-rolling Max distance: 150 ft   Walk 10 feet activity   Assist     Assist level: Supervision/Verbal cueing Assistive device: Walker-rolling   Walk 50 feet activity   Assist    Assist level: Supervision/Verbal cueing Assistive device: Walker-rolling    Walk 150 feet activity   Assist    Assist level: Supervision/Verbal cueing Assistive device: Walker-rolling    Walk 10 feet on uneven surface  activity   Assist     Assist level: Minimal Assistance - Patient > 75% Assistive device: Other (comment) (railing on ramp)   Wheelchair     Assist Will patient use wheelchair at discharge?: No             Wheelchair 50 feet with 2 turns activity  Assist            Wheelchair 150 feet activity     Assist          Blood pressure 125/81, pulse 80, temperature 98.1 F (36.7 C), resp. rate 18, height 5\' 9"  (1.753 m), weight 70.3 kg, SpO2 100 %.  Medical Problem List and Plan: 1.  Balance deficits, flexed posture, shuffling gait, cognitive deficits, impairments in attention, dysphagia secondary to TBI with bilateral SAH. Per wife pt with increased HA and dizziness over the weekend no falls no blood  thinners   Continue CIR  -recent CT demonstrates evolution of SDH, SAH, contusions and not "strokes" as identified on report. 2.  Antithrombotics: -DVT/anticoagulation:  Mechanical: Sequential compression devices, below knee Bilateral lower extremities             -antiplatelet therapy: N/A 3. Persistent Headaches/Pain Management: Oxycodone prn.               -plan to transition to nonnarcotics given history.  Topamax trial for headaches 25mg  bid started 7/19 with some benefit, increased to 50 BID  Appears to be improving on 7/24 4. Mood: LCSW to follow for evaluation and support.              -antipsychotic agents: N/A  -continue sleep chart 5. Neuropsych: This patient is not capable of making decisions on his own behalf. 6. Skin/Wound Care: Routine Routine pressure relief measures.  7. Fluids/Electrolytes/Nutrition: doesn't like diet, has sore throat  Trial of megace   P.o. intake improving on 7/24 8. Tobacco abuse and Alcohol abuse with DTs: resumed thiamine and folic acid.   -clonidine tapered off.               -pt reports that he has a daily whiskey and coke at home. 9.  Dysphagia: Diet modified advanced to D3 thins.             Advance diet as tolerated per SLP 10. H/o RA: Managed with Meloxicam--changed to low dose prednisone, 1mg  11. Hyponatremia/Hypomagnesemia: Likely due to ETOH abuse.              Sodium 133 on 7/22, continue to monitor 12. Hepatic steatosis/Chronic pancreatitis: Continue Thiamine and Folic acid.              LFT's nl 7/19    LOS: 8 days A FACE TO FACE EVALUATION WAS PERFORMED  Melodee Lupe Lorie Phenix 11/18/2019, 3:52 PM

## 2019-11-19 ENCOUNTER — Inpatient Hospital Stay (HOSPITAL_COMMUNITY): Payer: 59

## 2019-11-19 ENCOUNTER — Inpatient Hospital Stay (HOSPITAL_COMMUNITY): Payer: 59 | Admitting: Occupational Therapy

## 2019-11-19 ENCOUNTER — Inpatient Hospital Stay (HOSPITAL_COMMUNITY): Payer: 59 | Admitting: Speech Pathology

## 2019-11-19 NOTE — Plan of Care (Signed)
  Problem: Consults Goal: RH BRAIN INJURY PATIENT EDUCATION Description: Description: See Patient Education module for eduction specifics Outcome: Progressing Goal: Skin Care Protocol Initiated - if Braden Score 18 or less Description: If consults are not indicated, leave blank or document N/A Outcome: Progressing Goal: Nutrition Consult-if indicated Outcome: Progressing Goal: Diabetes Guidelines if Diabetic/Glucose > 140 Description: If diabetic or lab glucose is > 140 mg/dl - Initiate Diabetes/Hyperglycemia Guidelines & Document Interventions  Outcome: Progressing   Problem: RH BOWEL ELIMINATION Goal: RH STG MANAGE BOWEL WITH ASSISTANCE Description: STG Manage Bowel with Assistance. Min Outcome: Progressing Goal: RH STG MANAGE BOWEL W/MEDICATION W/ASSISTANCE Description: STG Manage Bowel with Medication with Assistance. Min Outcome: Progressing   Problem: RH BLADDER ELIMINATION Goal: RH STG MANAGE BLADDER WITH ASSISTANCE Description: STG Manage Bladder With Assistance Min Outcome: Progressing   Problem: RH SKIN INTEGRITY Goal: RH STG SKIN FREE OF INFECTION/BREAKDOWN Description: With moderate assistance. Outcome: Progressing   Problem: RH SAFETY Goal: RH STG ADHERE TO SAFETY PRECAUTIONS W/ASSISTANCE/DEVICE Description: STG Adhere to Safety Precautions With Assistance/Device. Mod Outcome: Progressing

## 2019-11-19 NOTE — Progress Notes (Addendum)
Physical Therapy Session Note  Patient Details  Name: William Gibson MRN: [id number] Date of Birth: December 24, 1945  Today's Date: 11/19/2019 PT Individual Time:1515  - 1600, 45 min     Short Term Goals: Week 1:  PT Short Term Goal 1 (Week 1): Pt will perform supine<>sit with CGA PT Short Term Goal 1 - Progress (Week 1): Met PT Short Term Goal 2 (Week 1): Pt will perform functional transfers using LRAD with CGA PT Short Term Goal 2 - Progress (Week 1): Met PT Short Term Goal 3 (Week 1): Pt will ambulate at least 163f using LRAD with CGA PT Short Term Goal 3 - Progress (Week 1): Met PT Short Term Goal 4 (Week 1): Pt will ascend/descend 4 steps using HRs per home set-up with CGA PT Short Term Goal 4 - Progress (Week 1): Other (comment) (adequate to meet goal - pt negotiates 24 steps with B rails & CGA, unsure of rail set up at home) Week 2:  PT Short Term Goal 1 (Week 2): STG = LTG due to estimated d/c date.     Skilled Therapeutic Interventions/Progress Updates:  Pt washing hands at sink with NT supervising.  Gait training with RW to therapy room, CGA/supervision.  Standing balance activities: standing on Airex mat while manipulating playing cards from knee height to overhead placement of vertical board in front of him, without LOB.  Standing on wedge while manipulating small figures with L hand, x 5 minutes without LOB.  Clockwise/counterclockwise stepping through 4 square arrangement of 2 canes, with frequent LOB 1st trial each q direction, improved by 2nd trial.  Gait training without AD over level tile and carpet with numerous turns, with min assist for occasional LOB during turns.  Floor transfer to floor mat with CGA assist, safely.   neuromuscular re-education via multimodal cues for core strengthening on hands and knees>< contralateral hip and shoulder extensions (bird dog movements) x 4 each, with difficulty but no LOB.  Gait training to return to room, RW, kicking Yoga block with  R/L feet.  Pt was impulsive when kicking with L foot, with 3 LOB requiring min assist to regain.  At end of session, pt seated in w/c with seat belt alarm set and needs at hand.  Wife present.     Therapy Documentation Precautions:  Precautions Precautions: Fall, Other (comment) Precaution Comments: forward trunk flexion in standing/gait Restrictions Weight Bearing Restrictions: No  Pain: Pain Assessment Pain Scale: 0-10 Pain Score: 0-No pain     Therapy/Group: Individual Therapy  Shanan Fitzpatrick 11/19/2019, 12:28 PM

## 2019-11-19 NOTE — Progress Notes (Signed)
Speech Language Pathology Daily Session Note  Patient Details  Name: William Gibson MRN: [id number] Date of Birth: 1945-08-25  Today's Date: 11/19/2019 SLP Individual Time: 1105-1200 SLP Individual Time Calculation (min): 55 min  Short Term Goals: Week 2: SLP Short Term Goal 1 (Week 2): STGs=LTGs due to ELOS  Skilled Therapeutic Interventions:  Pt was seen for skilled ST targeting cognitive goals.  Pt was seated in wheelchair upon arrival, awake, alert, and willing to participate in therapy.  SLP facilitated the session with a medication management task to address problem solving goals.  Pt initially needed mod assist to organize once daily pills into a twice daily pill box; however, with increased task familiarity therapist was able to fade cues to supervision until pt reached a twice daily scheduled medication, at which point pt again needed up to mod assist multimodal cues.  Pt reports eagerness to discharge as his family vacation to the beach is scheduled for this upcoming week but understands if he needs to remain in the hospital until the 29th.  Pt reports he will have someone with him all the time at home and he could verbalize some basic safety precautions for when he is mobilizing with min question cues.  Pt requested to use the bathroom before therapist departed and needed supervision for safety awareness when transferring to commode.  Pt was left in wheelchair with seat belt alarm set and telesitter in place, call bell within reach.  Continue per current plan of care.    Pain Pain Assessment Pain Scale: 0-10 Pain Score: 0-No pain  Therapy/Group: Individual Therapy  William Gibson, Selinda Orion 11/19/2019, 12:16 PM

## 2019-11-19 NOTE — Progress Notes (Signed)
Occupational Therapy Session Note  Patient Details  Name: William Gibson MRN: [id number] Date of Birth: 1945-05-09  Today's Date: 11/19/2019 OT Individual Time: 2505-3976 OT Individual Time Calculation (min): 45 min    Short Term Goals: Week 2:  OT Short Term Goal 1 (Week 2): LTG=STG 2/2 ELOS  Skilled Therapeutic Interventions/Progress Updates:  Patient met attempting to wheel self to sink despite belt alarm around his waist. When this OT asked the patient what he was trying to do, he did not answer. Patient in agreement with OT treatment session with focus on dynamic standing balance, household mobility with and without AD, and therapeutic exercise a detailed below. Patient declining BADLs in room but in agreement with therapeutic exercise and therapeutic activity in gym. Patient ambulated to ortho gym with use of RW and CGA. Dynamic obstacle course with cones without use of AD with focus on safety awareness and balance. Patient demonstrates shuffle-like gait causing LOB x2 requiring Mod A for correction. Pt. completed 10 minutes on SciFit arm ergometer with focus on BUE strengthening. Session concluded with patient seated in wc with call bell within reach, belt alarm activated, and all needs met.    Therapy Documentation Precautions:  Precautions Precautions: Fall, Other (comment) Precaution Comments: forward trunk flexion in standing/gait Restrictions Weight Bearing Restrictions: No General:    Therapy/Group: Individual Therapy  Tabithia Stroder R [place]-Davis 11/19/2019, 7:14 AM

## 2019-11-19 NOTE — Progress Notes (Signed)
[place] PHYSICAL MEDICINE & REHABILITATION PROGRESS NOTE   Subjective/Complaints: Patient seen laying in bed this morning.  He is states he slept well overnight.  He states he wants to go home.  No reported issues overnight  ROS: Denies CP, SOB, N/V/D  Objective:   No results found. No results for input(s): WBC, HGB, HCT, PLT in the last 72 hours. No results for input(s): NA, K, CL, CO2, GLUCOSE, BUN, CREATININE, CALCIUM in the last 72 hours.  Intake/Output Summary (Last 24 hours) at 11/19/2019 0854 Last data filed at 11/19/2019 0827 Gross per 24 hour  Intake 920 ml  Output --  Net 920 ml     Physical Exam: Vital Signs Blood pressure (!) 146/95, pulse 75, temperature 97.8 F (36.6 C), temperature source Oral, resp. rate 18, height 5\' 9"  (1.753 m), weight 70.3 kg, SpO2 100 %. Constitutional: No distress . Vital signs reviewed. HENT: Normocephalic.  Atraumatic. Eyes: EOMI. No discharge. Cardiovascular: No JVD.  RRR Respiratory: Normal effort.  No stridor.  Bilateral clear to auscultation GI: Non-distended.  BS +. Skin: Warm and dry.  Intact. Psych: Normal mood.  Normal behavior. Musc: No edema in extremities.  No tenderness in extremities. Neuro: Alert Motor: Grossly 5/5 throughout, stable  Assessment/Plan: 1. Functional deficits secondary to TBI which require 3+ hours per day of interdisciplinary therapy in a comprehensive inpatient rehab setting.  Physiatrist is providing close team supervision and 24 hour management of active medical problems listed below.  Physiatrist and rehab team continue to assess barriers to discharge/monitor patient progress toward functional and medical goals  Care Tool:  Bathing  Bathing activity did not occur: Refused Body parts bathed by patient: Right arm, Left arm, Chest, Abdomen, Front perineal area, Buttocks, Right upper leg, Left upper leg, Face, Left lower leg, Right lower leg         Bathing assist Assist Level: Contact  Guard/Touching assist     Upper Body Dressing/Undressing Upper body dressing   What is the patient wearing?: Pull over shirt    Upper body assist Assist Level: Set up assist    Lower Body Dressing/Undressing Lower body dressing      What is the patient wearing?: Pants, Underwear/pull up     Lower body assist Assist for lower body dressing: Contact Guard/Touching assist     Toileting Toileting    Toileting assist Assist for toileting: Contact Guard/Touching assist     Transfers Chair/bed transfer  Transfers assist     Chair/bed transfer assist level: Contact Guard/Touching assist     Locomotion Ambulation   Ambulation assist      Assist level: Supervision/Verbal cueing Assistive device: Walker-rolling Max distance: 150 ft   Walk 10 feet activity   Assist     Assist level: Supervision/Verbal cueing Assistive device: Walker-rolling   Walk 50 feet activity   Assist    Assist level: Supervision/Verbal cueing Assistive device: Walker-rolling    Walk 150 feet activity   Assist    Assist level: Supervision/Verbal cueing Assistive device: Walker-rolling    Walk 10 feet on uneven surface  activity   Assist     Assist level: Minimal Assistance - Patient > 75% Assistive device: Other (comment) (railing on ramp)   Wheelchair     Assist Will patient use wheelchair at discharge?: No             Wheelchair 50 feet with 2 turns activity    Assist  Wheelchair 150 feet activity     Assist          Blood pressure (!) 146/95, pulse 75, temperature 97.8 F (36.6 C), temperature source Oral, resp. rate 18, height 5\' 9"  (1.753 m), weight 70.3 kg, SpO2 100 %.  Medical Problem List and Plan: 1.  Balance deficits, flexed posture, shuffling gait, cognitive deficits, impairments in attention, dysphagia secondary to TBI with bilateral SAH. Per wife pt with increased HA and dizziness over the weekend no falls no  blood thinners   Continue CIR  -recent CT demonstrates evolution of SDH, SAH, contusions and not "strokes" as identified on report. 2.  Antithrombotics: -DVT/anticoagulation:  Mechanical: Sequential compression devices, below knee Bilateral lower extremities             -antiplatelet therapy: N/A 3. Persistent Headaches/Pain Management: Oxycodone prn.               -plan to transition to nonnarcotics given history.  Topamax trial for headaches 25mg  bid started 7/19 with some benefit, increased to 50 BID  Appears controlled on 7/25 4. Mood: LCSW to follow for evaluation and support.              -antipsychotic agents: N/A  -continue sleep chart 5. Neuropsych: This patient is not capable of making decisions on his own behalf.  Telesitter for safety 6. Skin/Wound Care: Routine Routine pressure relief measures.  7. Fluids/Electrolytes/Nutrition: doesn't like diet, has sore throat  Trial of megace   Oral intake improved 8. Tobacco abuse and Alcohol abuse with DTs: resumed thiamine and folic acid.   -clonidine tapered off.               -pt reports that he has a daily whiskey and coke at home. 9.  Dysphagia: Diet modified advanced to D3 thins.             Advance diet as tolerated per SLP 10. H/o RA: Managed with Meloxicam--changed to low dose prednisone, 1mg  11. Hyponatremia/Hypomagnesemia: Likely due to ETOH abuse.              Sodium 133 on 7/22, continue to monitor 12. Hepatic steatosis/Chronic pancreatitis: Continue Thiamine and Folic acid.              LFT's nl 7/19    LOS: 9 days A FACE TO FACE EVALUATION WAS PERFORMED  Angella Montas Lorie Phenix 11/19/2019, 8:54 AM

## 2019-11-20 ENCOUNTER — Inpatient Hospital Stay (HOSPITAL_COMMUNITY): Payer: 59 | Admitting: Physical Therapy

## 2019-11-20 ENCOUNTER — Inpatient Hospital Stay (HOSPITAL_COMMUNITY): Payer: 59 | Admitting: Occupational Therapy

## 2019-11-20 DIAGNOSIS — S069X0A Unspecified intracranial injury without loss of consciousness, initial encounter: Secondary | ICD-10-CM

## 2019-11-20 LAB — BASIC METABOLIC PANEL
Anion gap: 10 (ref 5–15)
BUN: 23 mg/dL (ref 8–23)
CO2: 21 mmol/L — ABNORMAL LOW (ref 22–32)
Calcium: 9.5 mg/dL (ref 8.9–10.3)
Chloride: 107 mmol/L (ref 98–111)
Creatinine, Ser: 1.22 mg/dL (ref 0.61–1.24)
GFR calc Af Amer: >60 mL/min (ref >60)
GFR calc non Af Amer: 58 mL/min — ABNORMAL LOW (ref >60)
Glucose, Bld: 108 mg/dL — ABNORMAL HIGH (ref 70–99)
Potassium: 4.4 mmol/L (ref 3.5–5.1)
Sodium: 138 mmol/L (ref 135–145)

## 2019-11-20 NOTE — Progress Notes (Signed)
Occupational Therapy Session Note  Patient Details  Name: William Gibson MRN: [id number] Date of Birth: 12-18-1945  Today's Date: 11/20/2019 OT Individual Time: 1402-1502 OT Individual Time Calculation (min): 60 min   Short Term Goals: Week 2:  OT Short Term Goal 1 (Week 2): LTG=STG 2/2 ELOS  Skilled Therapeutic Interventions/Progress Updates:    Pt greeted seated in wc and agreeable to OT treatment session. Pt declined to go to the bathroom prior to leaving room. Pt ambulated from room to ortho gym w/ RW and close supervision. No overt LOB that pt could not correct. UB there-ex using UE Ergometer on level 5. Pt completed 2 sets of 5 minutes. Worked on balance strategies standing on foam block and used Dynavision as balance challenge reaching outside base of support. Obstacle course in hallway stepping over and around objects with correct use of RW. UB there-ex using 4 lb dowel rod in standing, 3 sets of 10 chest press, bicep curl, and straight arm raises.  Pt ambulated back to room at end of session and left seated in wc with alarm belt on, call bell in reach, and needs met.  Therapy Documentation Precautions:  Precautions Precautions: Fall, Other (comment) Precaution Comments: forward trunk flexion in standing/gait Restrictions Weight Bearing Restrictions: No Pain:  denies pain  Therapy/Group: Individual Therapy  Valma Cava 11/20/2019, 2:20 PM

## 2019-11-20 NOTE — Progress Notes (Signed)
LBM 7/22. Pt received Miralax PRN on 7/25, however has had no results throughout this shift. RN offered suppository to pt, how pt refused. Pt was educated on proper bowel emptying and possible complications.

## 2019-11-20 NOTE — Progress Notes (Signed)
Physical Therapy Session Note  Patient Details  Name: William Gibson MRN: [id number] Date of Birth: 1945-11-23  Today's Date: 11/20/2019 PT Individual Time: 3335-4562 PT Individual Time Calculation (min): 55 min   Short Term Goals: Week 2:  PT Short Term Goal 1 (Week 2): STG = LTG due to estimated d/c date.  Skilled Therapeutic Interventions/Progress Updates:  Pt received in w/c & agreeable to tx. Sit<>stand throughout session with supervision. Gait throughout unit with RW and supervision with pt demonstrating improved ability to ambulate within base of AD at all times & during turns. Pt does demonstrate intermittent impaired L heel strike with cuing to correct. Pt completes sit<>stand from various surfaces (low, compliant couch and standard chair without armrests) with supervision. Pt completes car transfer at SUV elevated height with supervision and floor transfer with supervision. During floor transfer pt reports dizziness/lightheadedness with quick transitional movements but reports he waits until these symptoms go away completely before moving again, demonstrating increasing safety awareness. Educated pt on fall recovery, need for supervision especially when ambulating outdoors, and recommendation to carry cell phone in his pocket. Discussed PT f/u and need to use RW at all times with pt voicing no questions/concerns re: d/c home & is eager to d/c soon. Pt negotiates 12 steps (6") with B rails & supervision with pt self selecting gait pattern. Pt engages in wii bowling (reports he plays at home with grandson) without BUE support with task focusing on dynamic balance & pt requiring CGA<>Min assist & no LOB noted. Pt utilized nu-step on level 4 x 5 minutes with all four extremities with focus on global strengthening & endurance training, coordination of reciprocal movements and L NMR. At end of session pt left in w/c with chair alarm donned, call bell in reach, telesitter in room.  Therapy  Documentation Precautions:  Precautions Precautions: Fall, Other (comment) Precaution Comments: forward trunk flexion in standing/gait Restrictions Weight Bearing Restrictions: No  Pain: Pt reports "slight HA" - reports he's premedicated   Therapy/Group: Individual Therapy  [name] 11/20/2019, 11:41 AM

## 2019-11-20 NOTE — Progress Notes (Signed)
[place] PHYSICAL MEDICINE & REHABILITATION PROGRESS NOTE   Subjective/Complaints: DOing well aware of todays date and d/c, mild HA  ROS: Denies CP, SOB, N/V/D  Objective:   No results found. No results for input(s): WBC, HGB, HCT, PLT in the last 72 hours. Recent Labs    11/20/19 0646  NA 138  K 4.4  CL 107  CO2 21*  GLUCOSE 108*  BUN 23  CREATININE 1.22  CALCIUM 9.5    Intake/Output Summary (Last 24 hours) at 11/20/2019 1123 Last data filed at 11/20/2019 0700 Gross per 24 hour  Intake 598 ml  Output --  Net 598 ml     Physical Exam: Vital Signs Blood pressure 115/83, pulse 88, temperature 97.7 F (36.5 C), temperature source Oral, resp. rate 17, height 5\' 9"  (1.753 m), weight 70.3 kg, SpO2 99 %. Constitutional: No distress . Vital signs reviewed. HENT: Normocephalic.  Atraumatic. Eyes: EOMI. No discharge. Cardiovascular: No JVD.  RRR Respiratory: Normal effort.  No stridor.  Bilateral clear to auscultation GI: Non-distended.  BS +. Skin: Warm and dry.  Intact. Psych: Normal mood.  Normal behavior. Musc: No edema in extremities.  No tenderness in extremities. Neuro: Alert Motor: Grossly 5/5 throughout, stable  Assessment/Plan: 1. Functional deficits secondary to TBI which require 3+ hours per day of interdisciplinary therapy in a comprehensive inpatient rehab setting.  Physiatrist is providing close team supervision and 24 hour management of active medical problems listed below.  Physiatrist and rehab team continue to assess barriers to discharge/monitor patient progress toward functional and medical goals  Care Tool:  Bathing  Bathing activity did not occur: Refused Body parts bathed by patient: Right arm, Left arm, Chest, Abdomen, Front perineal area, Buttocks, Right upper leg, Left upper leg, Face, Left lower leg, Right lower leg         Bathing assist Assist Level: Contact Guard/Touching assist     Upper Body Dressing/Undressing Upper body  dressing   What is the patient wearing?: Button up shirt    Upper body assist Assist Level: Contact Guard/Touching assist (standing)    Lower Body Dressing/Undressing Lower body dressing      What is the patient wearing?: Pants, Underwear/pull up     Lower body assist Assist for lower body dressing: Contact Guard/Touching assist     Toileting Toileting Toileting Activity did not occur Landscape architect and hygiene only): Refused  Toileting assist Assist for toileting: Contact Guard/Touching assist     Transfers Chair/bed transfer  Transfers assist     Chair/bed transfer assist level: Contact Guard/Touching assist     Locomotion Ambulation   Ambulation assist      Assist level: Contact Guard/Touching assist Assistive device: Walker-rolling Max distance: 150   Walk 10 feet activity   Assist     Assist level: Contact Guard/Touching assist Assistive device: Walker-rolling   Walk 50 feet activity   Assist    Assist level: Contact Guard/Touching assist Assistive device: Walker-rolling    Walk 150 feet activity   Assist    Assist level: Contact Guard/Touching assist Assistive device: Walker-rolling    Walk 10 feet on uneven surface  activity   Assist     Assist level: Minimal Assistance - Patient > 75% Assistive device: Other (comment) (railing on ramp)   Wheelchair     Assist Will patient use wheelchair at discharge?: No             Wheelchair 50 feet with 2 turns activity    Assist  Wheelchair 150 feet activity     Assist          Blood pressure 115/83, pulse 88, temperature 97.7 F (36.5 C), temperature source Oral, resp. rate 17, height 5\' 9"  (1.753 m), weight 70.3 kg, SpO2 99 %.  Medical Problem List and Plan: 1.  Balance deficits, flexed posture, shuffling gait, cognitive deficits, impairments in attention, dysphagia secondary to TBI with bilateral SAH. Per wife pt with increased HA and  dizziness over the weekend no falls no blood thinners   Continue CIR- plan is for d/c 7/29  -recent CT demonstrates evolution of SDH, SAH, contusions and not "strokes" as identified on report. 2.  Antithrombotics: -DVT/anticoagulation:  Mechanical: Sequential compression devices, below knee Bilateral lower extremities             -antiplatelet therapy: N/A 3. Persistent Headaches/Pain Management: Oxycodone prn.               -plan to transition to nonnarcotics given history.  Topamax trial for headaches 25mg  bid started 7/19 with some benefit, increased to 50 BID  Appears controlled on 7/25 4. Mood: LCSW to follow for evaluation and support.              -antipsychotic agents: N/A  -continue sleep chart 5. Neuropsych: This patient is not capable of making decisions on his own behalf.  Telesitter for safety 6. Skin/Wound Care: Routine Routine pressure relief measures.  7. Fluids/Electrolytes/Nutrition: doesn't like diet, has sore throat  Trial of megace   Oral intake improved 8. Tobacco abuse and Alcohol abuse with DTs: resumed thiamine and folic acid.   -clonidine tapered off.               -pt reports that he has a daily whiskey and coke at home. 9.  Dysphagia: Diet modified advanced to D3 thins.             Advance diet as tolerated per SLP 10. H/o RA: Managed with Meloxicam--changed to low dose prednisone, 1mg  no joint pain- f/u PCP/RHeum 11. Hyponatremia/Hypomagnesemia: Likely due to ETOH abuse.              Sodium 133 on 7/22, continue to monitor 12. Hepatic steatosis/Chronic pancreatitis: Continue Thiamine and Folic acid.              LFT's nl 7/19    LOS: 10 days A FACE TO FACE EVALUATION WAS PERFORMED  Charlett Blake 11/20/2019, 11:23 AM

## 2019-11-20 NOTE — Progress Notes (Signed)
Patient ID: William Gibson, male   DOB: 1945/07/03, 74 y.o.   MRN: [id number]  SW received phone call from pt wife William Gibson [phone]) who called to ask questions related to husband's discharge. Discussion on benefits of OPT vs HH. Wife amenable to outpatient. SW informed referral sent to [facility]. SW also informed will follow-up with updates tomorrow after team conference. Fam edu remains for [place], MSW, [place] Office: [phone] Cell: [phone] Fax: [phone]

## 2019-11-20 NOTE — Progress Notes (Signed)
Occupational Therapy Session Note  Patient Details  Name: William Gibson MRN: [id number] Date of Birth: 1946-04-26  Today's Date: 11/20/2019 OT Individual Time: 9295-7473 OT Individual Time Calculation (min): 55 min   Skilled Therapeutic Interventions/Progress Updates:    Pt greeted while standing at the sink with NT, washing his hands. Close supervision for standing balance while completing oral care afterwards. His meal tray was in the room and he wanted to start the session by eating breakfast. Close supervision for ambulatory transfer to EOB using RW for support. OT then minimized distractions in the room. Pt able to set himself up with condiments with cues to initiate. He did need A to remove the lid from his coffee cup. Vcs for hard swallows while he ate his meal, no coughs. Afterwards pt ambulated with RW over to a chair in the room to retrieve a long sleeve shirt. CGA for dynamic standing while donning shirt. CGA for balance also when stooping to floor to retrieve his shoes. He then donned them while EOB unassisted. To work on higher level balance and functional cognition, had pt make up his bed with clean linen and self sequence steps needed to complete this IADL task. CGA for dynamic balance while bending outside base of support, ambulating around bed without AD, and when carrying pillows across the room. Afterwards pt wanted to sit to rest, full supervision provided while he drank a cup of water. Pt left in care of RN at end of session to receive his morning medicine.    Therapy Documentation Precautions:  Precautions Precautions: Fall, Other (comment) Precaution Comments: forward trunk flexion in standing/gait Restrictions Weight Bearing Restrictions: No Pain: pt denied having pain during tx Pain Assessment Pain Scale: 0-10 Pain Score: 0-No pain ADL:        Therapy/Group: Individual Therapy  Hildegard Hlavac A Ivoree Felmlee 11/20/2019, 12:09 PM

## 2019-11-20 NOTE — Plan of Care (Signed)
°  Problem: Consults Goal: RH BRAIN INJURY PATIENT EDUCATION Description: Description: See Patient Education module for eduction specifics Outcome: Progressing Goal: Skin Care Protocol Initiated - if Braden Score 18 or less Description: If consults are not indicated, leave blank or document N/A Outcome: Progressing Goal: Nutrition Consult-if indicated Outcome: Progressing Goal: Diabetes Guidelines if Diabetic/Glucose > 140 Description: If diabetic or lab glucose is > 140 mg/dl - Initiate Diabetes/Hyperglycemia Guidelines & Document Interventions  Outcome: Progressing   Problem: RH BOWEL ELIMINATION Goal: RH STG MANAGE BOWEL WITH ASSISTANCE Description: STG Manage Bowel with Assistance. Min Outcome: Progressing Goal: RH STG MANAGE BOWEL W/MEDICATION W/ASSISTANCE Description: STG Manage Bowel with Medication with Assistance. Min Outcome: Progressing   Problem: RH BLADDER ELIMINATION Goal: RH STG MANAGE BLADDER WITH ASSISTANCE Description: STG Manage Bladder With Assistance Min Outcome: Progressing   Problem: RH SKIN INTEGRITY Goal: RH STG SKIN FREE OF INFECTION/BREAKDOWN Description: With moderate assistance. Outcome: Progressing   Problem: RH SAFETY Goal: RH STG ADHERE TO SAFETY PRECAUTIONS W/ASSISTANCE/DEVICE Description: STG Adhere to Safety Precautions With Assistance/Device. Mod Outcome: Progressing

## 2019-11-21 ENCOUNTER — Inpatient Hospital Stay (HOSPITAL_COMMUNITY): Payer: 59 | Admitting: Physical Therapy

## 2019-11-21 ENCOUNTER — Inpatient Hospital Stay (HOSPITAL_COMMUNITY): Payer: 59 | Admitting: Occupational Therapy

## 2019-11-21 ENCOUNTER — Inpatient Hospital Stay (HOSPITAL_COMMUNITY): Payer: 59

## 2019-11-21 NOTE — Plan of Care (Signed)
  Problem: Consults Goal: RH BRAIN INJURY PATIENT EDUCATION Description: Description: See Patient Education module for eduction specifics Outcome: Progressing Goal: Skin Care Protocol Initiated - if Braden Score 18 or less Description: If consults are not indicated, leave blank or document N/A Outcome: Progressing Goal: Nutrition Consult-if indicated Outcome: Progressing   Problem: RH BOWEL ELIMINATION Goal: RH STG MANAGE BOWEL WITH ASSISTANCE Description: STG Manage Bowel with Assistance. Min Outcome: Progressing Goal: RH STG MANAGE BOWEL W/MEDICATION W/ASSISTANCE Description: STG Manage Bowel with Medication with Assistance. Min Outcome: Progressing   Problem: RH BLADDER ELIMINATION Goal: RH STG MANAGE BLADDER WITH ASSISTANCE Description: STG Manage Bladder With Assistance Min Outcome: Progressing   Problem: RH SKIN INTEGRITY Goal: RH STG SKIN FREE OF INFECTION/BREAKDOWN Description: With moderate assistance. Outcome: Progressing   Problem: RH SAFETY Goal: RH STG ADHERE TO SAFETY PRECAUTIONS W/ASSISTANCE/DEVICE Description: STG Adhere to Safety Precautions With Assistance/Device. Mod Outcome: Progressing

## 2019-11-21 NOTE — Progress Notes (Signed)
Physical Therapy Session Note  Patient Details  Name: William Gibson MRN: [id number] Date of Birth: 03-Jul-1945  Today's Date: 11/21/2019 PT Individual Time: 1455-1550 PT Individual Time Calculation (min): 55 min   Short Term Goals: Week 2:  PT Short Term Goal 1 (Week 2): STG = LTG due to estimated d/c date.  Skilled Therapeutic Interventions/Progress Updates:  Pt received in w/c & agreeable to tx. Sit<>stand with supervision & RW & ambulation throughout unit with RW & supervision with improved ability to ambulate within base of AD. Pt completed Berg Balance Test & scored [phone]; educated pt on change in score compared to 32/56 on 11/15/19, interpretation of score & current fall risk. Patient demonstrates increased fall risk as noted by score of 45/56 on Berg Balance Scale.  (<36= high risk for falls, close to 100%; 37-45 significant >80%; 46-51 moderate >50%; 52-55 lower >25%). Pt performed standing cone taps with CGA<>min assist (2 LOB & min assist to correct) with activity focusing on L NMR, dynamic balance, and weight shifting L<>R. Pt engaged in single leg stance with min assist for challenges to balance. Pt utilized nu-step on level 5 x 10 minutes with all four extremities for global strengthening & endurance training, coordination of reciprocal movements & NMR. Pt returned to room & wife present; educated her on recommendation for pt to use RW at all times (she plans to check to make sure they have one at home & will notify PT tomorrow) & reviewed home entrances (pt can use front entrance with B rails, which this PT recommends). Pt left in w/c with chair alarm donned, call bell in reach, & wife in room.   Therapy Documentation Precautions:  Precautions Precautions: Fall, Other (comment) Precaution Comments: forward trunk flexion in standing/gait Restrictions Weight Bearing Restrictions: No  Pain: Pt denies c/o pain   Balance: Balance Balance Assessed: Yes Standardized Balance  Assessment Standardized Balance Assessment: Berg Balance Test Berg Balance Test Sit to Stand: Able to stand without using hands and stabilize independently Standing Unsupported: Able to stand safely 2 minutes Sitting with Back Unsupported but Feet Supported on Floor or Stool: Able to sit safely and securely 2 minutes Stand to Sit: Sits safely with minimal use of hands Transfers: Able to transfer safely, minor use of hands Standing Unsupported with Eyes Closed: Able to stand 10 seconds with supervision Standing Ubsupported with Feet Together: Able to place feet together independently and stand for 1 minute with supervision From Standing, Reach Forward with Outstretched Arm: Can reach confidently >25 cm (10") From Standing Position, Pick up Object from Floor: Able to pick up shoe, needs supervision From Standing Position, Turn to Look Behind Over each Shoulder: Looks behind from both sides and weight shifts well Turn 360 Degrees: Able to turn 360 degrees safely but slowly Standing Unsupported, Alternately Place Feet on Step/Stool: Able to complete 4 steps without aid or supervision (completes 8 steps with supervision) Standing Unsupported, One Foot in Front: Able to plae foot ahead of the other independently and hold 30 seconds Standing on One Leg: Tries to lift leg/unable to hold 3 seconds but remains standing independently Total Score: 45    Therapy/Group: Individual Therapy  William Gibson 11/21/2019, 3:53 PM

## 2019-11-21 NOTE — Progress Notes (Signed)
Occupational Therapy Session Note  Patient Details  Name: William Gibson MRN: [id number] Date of Birth: November 01, 1945  Today's Date: 11/21/2019 OT Individual Time: 1300-1358 OT Individual Time Calculation (min): 58 min    Short Term Goals: Week 2:  OT Short Term Goal 1 (Week 2): LTG=STG 2/2 ELOS  Skilled Therapeutic Interventions/Progress Updates:    Pt greeted seated in wc and agreeable to OT treatment session focused on self-care retraining. Pt ambulated in the room without AD to begin with, requiring CGA. Educated on safety awareness and balance strategies for accessing dresser drawers to collect clothing. Pt doffed clothes seated on commode with supervision and min verbal cues for safety when stepping out of pants. Pt completed bathing with overall set-up A/supervision with min cues for safety techniques. Pt then dressed seated on BSC in bathroom with increased time, but overall close supervision. Pt ambulated to therapy dayroom w/ RW and supervision. Worked on Dynamic standing balance with Wii bowling activity. Pt with good recall of how to play game as he had played Wii bowling with his grandchildren. Pt only needed occasional min cues to do what game was asking him to do. Pt needed CGA for balance without AD when stepping outside base of support to toss bowling ball. Pt returned to room at end of session and left seated in wc with alarm belt on, call bell in reach, and needs met.   Therapy Documentation Precautions:  Precautions Precautions: Fall, Other (comment) Precaution Comments: forward trunk flexion in standing/gait Restrictions Weight Bearing Restrictions: No Pain: Pain Assessment Pain Score: 0-No pain   Therapy/Group: Individual Therapy  Valma Cava 11/21/2019, 1:55 PM

## 2019-11-21 NOTE — Progress Notes (Signed)
Physical Therapy Discharge Summary  Patient Details  Name: William Gibson MRN: [id number] Date of Birth: Jun 06, 1945  Today's Date: 11/22/2019   Patient has met 9 of 9 long term goals due to improved activity tolerance, improved balance, improved postural control, increased strength, ability to compensate for deficits, functional use of  left upper extremity and left lower extremity, improved awareness and improved coordination.  Patient to discharge at an ambulatory level Supervision with RW.  Patient's care partner is independent to provide the necessary physical and cognitive assistance at discharge.  Reasons goals not met: n/a  Recommendation:  Patient will benefit from ongoing skilled PT services in outpatient setting to continue to advance safe functional mobility, address ongoing impairments in balance, progress gait with LRAD, cognition, endurance, strength, and minimize fall risk.  Equipment: No equipment provided - pt already has a RW  Reasons for discharge: treatment goals met and discharge from hospital  Patient/family agrees with progress made and goals achieved: Yes  PT Discharge Precautions/Restrictions Precautions Precautions: Fall Restrictions Weight Bearing Restrictions: No  Vision/Perception  Pt wears glasses for reading only at baseline. Pt denies blurry vision at this time.  Perception Perception: Within Functional Limits Praxis Praxis: Intact   Cognition Overall Cognitive Status: Impaired/Different from baseline Arousal/Alertness: Awake/alert Orientation Level: Oriented X4 Focused Attention: Appears intact Memory: Impaired Memory Impairment: Decreased recall of new information Awareness: Impaired Awareness Impairment: Anticipatory impairment (this is improving as pt has reported he knows he needs to rest if he ever feels dizzy/lightheaded before proceeding with movement) Problem Solving: Impaired Problem Solving Impairment: Functional  complex Behaviors: Impulsive Safety/Judgment: Impaired [employer] Scales of Cognitive Functioning: Purposeful/appropriate  Sensation Sensation Light Touch: Appears Intact Proprioception: Not tested Coordination Gross Motor Movements are Fluid and Coordinated: Yes Fine Motor Movements are Fluid and Coordinated: Yes  Motor  Motor Motor: Abnormal postural alignment and control Motor - Discharge Observations: forward flexed trunk standing/ambulating but improved since evaluation, generalized deconditioning   Mobility Bed Mobility Bed Mobility: Supine to Sit;Sit to Supine;Rolling Left Rolling Left: Independent Supine to Sit: Independent Sit to Supine: Independent Transfers Transfers: Sit to Stand;Stand to Sit;Stand Pivot Transfers Sit to Stand: Supervision/Verbal cueing Stand to Sit: Supervision/Verbal cueing Stand Pivot Transfers: Supervision/Verbal cueing Transfer (Assistive device): Rolling walker   Locomotion  Gait Ambulation: Yes Gait Assistance: Supervision/Verbal cueing Gait Distance (Feet):  (>150 ft) Assistive device: Rolling walker Gait Assistance Details: occasional verbal cuing to ambulate within base of AD Gait Gait: Yes Gait Pattern: Decreased step length - left;Decreased dorsiflexion - left (slight forward trunk lean on RW) Stairs / Additional Locomotion Stairs: Yes Stairs Assistance: Supervision/Verbal cueing Stair Management Technique: Two rails Number of Stairs: 12 Height of Stairs: 6 Ramp: Supervision/Verbal cueing (ambulatory with RW) Wheelchair Mobility Wheelchair Mobility: No   Trunk/Postural Assessment  Cervical Assessment Cervical Assessment: Exceptions to [facility] (forward head) Thoracic Assessment Thoracic Assessment: Exceptions to [facility] (rounded shoulders, kyphosis) Lumbar Assessment Lumbar Assessment: Exceptions to [facility] (posterior pelvic tilt) Postural Control Righting Reactions: slightly delayed Protective Responses: slightly  delayed Postural Limitations: decreased due to anteriorly displaced center of mass due to trunk posture   Balance Balance Balance Assessed: Yes Standardized Balance Assessment Standardized Balance Assessment: Berg Balance Test Berg Balance Test Sit to Stand: Able to stand without using hands and stabilize independently Standing Unsupported: Able to stand safely 2 minutes Sitting with Back Unsupported but Feet Supported on Floor or Stool: Able to sit safely and securely 2 minutes Stand to Sit: Sits safely with minimal use of  hands Transfers: Able to transfer safely, minor use of hands Standing Unsupported with Eyes Closed: Able to stand 10 seconds with supervision Standing Ubsupported with Feet Together: Able to place feet together independently and stand for 1 minute with supervision From Standing, Reach Forward with Outstretched Arm: Can reach confidently >25 cm (10") From Standing Position, Pick up Object from Floor: Able to pick up shoe, needs supervision From Standing Position, Turn to Look Behind Over each Shoulder: Looks behind from both sides and weight shifts well Turn 360 Degrees: Able to turn 360 degrees safely but slowly Standing Unsupported, Alternately Place Feet on Step/Stool: Able to complete 4 steps without aid or supervision (completes 8 steps with supervision) Standing Unsupported, One Foot in Front: Able to plae foot ahead of the other independently and hold 30 seconds Standing on One Leg: Tries to lift leg/unable to hold 3 seconds but remains standing independently Total Score: 45  Extremity Assessment  RUE Assessment RUE Assessment: Within Functional Limits General Strength Comments: generalized weakness 4/5 LUE Assessment LUE Assessment: Within Functional Limits General Strength Comments: generalized weakness 4/5  RLE Assessment RLE Assessment: Within Functional Limits (not formally tested) LLE Assessment LLE Assessment: Within Functional Limits (not formally  tested)     William Gibson 11/22/2019, 2:50 PM

## 2019-11-21 NOTE — Progress Notes (Signed)
Occupational Therapy Discharge Summary  Patient Details  Name: William Gibson MRN: [id number] Date of Birth: February 08, 1946   Patient has met 64 of 13 long term goals due to improved activity tolerance, improved balance, postural control, ability to compensate for deficits, improved attention, improved awareness and improved coordination.  Patient to discharge at overall Supervision level.  Patient's care partner is independent to provide the necessary physical and cognitive assistance at discharge.    Reasons goals not met: n/a  Recommendation:  Patient will benefit from ongoing skilled OT services in outpatient setting to continue to advance functional skills in the area of BADL.  Equipment: 3-in-1 BSC  Reasons for discharge: treatment goals met and discharge from hospital  Patient/family agrees with progress made and goals achieved: Yes  OT Discharge Precautions/Restrictions  Precautions Precautions: Fall;Other (comment) Restrictions Weight Bearing Restrictions: No Pain  denies pain ADL ADL Eating: Independent Grooming: Independent Upper Body Bathing: Supervision/safety Lower Body Bathing: Supervision/safety Upper Body Dressing: Supervision/safety Lower Body Dressing: Supervision/safety Toileting: Supervision/safety Toilet Transfer: Close supervision Social research officer, government: Close supervision Perception  Perception: Within Functional Limits Praxis Praxis: Intact Cognition Arousal/Alertness: Awake/alert Orientation Level: Oriented X4 Focused Attention: Appears intact Memory: Impaired Awareness: Impaired Behaviors: Impulsive Safety/Judgment: Impaired [employer] Scales of Cognitive Functioning: Purposeful/appropriate Sensation Sensation Light Touch: Appears Intact Proprioception: Appears Intact Coordination Gross Motor Movements are Fluid and Coordinated: No Fine Motor Movements are Fluid and Coordinated: Yes Coordination and Movement Description:  improved smoothness and accuracy Motor  Motor Motor - Discharge Observations: continues to have generalized weakness Mobility  Bed Mobility Bed Mobility: Sit to Supine;Supine to Sit Rolling Right: Supervision/verbal cueing Rolling Left: Supervision/Verbal cueing Supine to Sit: Supervision/Verbal cueing Sit to Supine: Supervision/Verbal cueing Transfers Sit to Stand: Supervision/Verbal cueing Stand to Sit: Supervision/Verbal cueing  Balance Balance Balance Assessed: Yes Standardized Balance Assessment Dynamic Sitting Balance Dynamic Sitting - Balance Support: During functional activity Dynamic Sitting - Level of Assistance: 7: Independent Static Standing Balance Static Standing - Balance Support: During functional activity Static Standing - Level of Assistance: 6: Modified independent (Device/Increase time) Dynamic Standing Balance Dynamic Standing - Balance Support: During functional activity;No upper extremity supported Dynamic Standing - Level of Assistance: 5: Stand by assistance Extremity/Trunk Assessment RUE Assessment RUE Assessment: Within Functional Limits General Strength Comments: generalized weakness 4/5 LUE Assessment LUE Assessment: Within Functional Limits General Strength Comments: generalized weakness 4/5   Daneen Schick Tevan Marian 11/21/2019, 3:39 PM

## 2019-11-21 NOTE — Progress Notes (Signed)
Physical Therapy Session Note  Patient Details  Name: William Gibson MRN: [id number] Date of Birth: 04/04/1946  Today's Date: 11/21/2019 PT Individual Time: 0830-0900 PT Individual Time Calculation (min): 30 min   Short Term Goals: Week 1:  PT Short Term Goal 1 (Week 1): Pt will perform supine<>sit with CGA PT Short Term Goal 1 - Progress (Week 1): Met PT Short Term Goal 2 (Week 1): Pt will perform functional transfers using LRAD with CGA PT Short Term Goal 2 - Progress (Week 1): Met PT Short Term Goal 3 (Week 1): Pt will ambulate at least 133f using LRAD with CGA PT Short Term Goal 3 - Progress (Week 1): Met PT Short Term Goal 4 (Week 1): Pt will ascend/descend 4 steps using HRs per home set-up with CGA PT Short Term Goal 4 - Progress (Week 1): Other (comment) (adequate to meet goal - pt negotiates 24 steps with B rails & CGA, unsure of rail set up at home) Week 2:  PT Short Term Goal 1 (Week 2): STG = LTG due to estimated d/c date.  Skilled Therapeutic Interventions/Progress Updates:   Pt received sitting EOB and agreeable to PT, once h has finished breakfast. PT took over for NT to provide supervision assist while pt eating. Min cues for attention to task as pt internally distracted about wanted to d/c home today. Constant redirection and encouragement over following planned d/c date to allow for safe d/c home with family, but pt continued to express readiness to go home today.   Gait training in hall with RW 2 x 1038fwith supervision assist and only min cues for safe use of AD in turns to prepare for transfer to sitting. Dynamic gait training performed with no AD to weave through 8 cones x 2 and step over cane/4 inch step x 4. Supervision assist overall from PT with cues for decreased speed and adequate step length to prevent LOB. Patient returned to room and [facility] in reach and all needs met.         Therapy Documentation Precautions:   Precautions Precautions: Fall, Other (comment) Precaution Comments: forward trunk flexion in standing/gait Restrictions Weight Bearing Restrictions: No   Pain: denies   Therapy/Group: Individual Therapy  AuLorie Phenix/27/2021, 9:04 AM

## 2019-11-21 NOTE — Patient Care Conference (Signed)
Inpatient RehabilitationTeam Conference and Plan of Care Update Date: 11/21/2019   Time: 10:44 PM    Patient Name: William Gibson      Medical Record Number: [id number]  Date of Birth: Dec 30, 1945 Sex: Male         Room/Bed: 4W13C/4W13C-01 Payor Info: Payor: Theme park manager / Plan: [employer] OTHER / Product Type: *No Product type* /    Admit Date/Time:  11/10/2019  3:40 PM  Primary Diagnosis:  TBI (traumatic brain injury) [facility])  Hospital Problems: Principal Problem:   TBI (traumatic brain injury) [facility]) Active Problems:   Vascular headache    Expected Discharge Date: Expected Discharge Date: 11/23/19  Team Members Present: Physician leading conference: Dr. Leeroy Cha Care Coodinator Present: Loralee Pacas, LCSWA;Stacey Creig Hines, RN, BSN, [place] Nurse Present: Renda Rolls, LPN PT Present: Lavone Nian, PT OT Present: Cherylynn Ridges, OT SLP Present: Charolett Bumpers, SLP PPS Coordinator present : Gunnar Fusi, SLP     Current Status/Progress Goal Weekly Team Focus  Bowel/Bladder   Continent of B/B, LBM 7/26  Remain continent  Assess toileting q shift and prn   Swallow/Nutrition/ Hydration   dys 3 and thin, intermittent supervision  Supervision  trial tray of regular prior to upgrade   ADL's   Supervision/CGA  Supervision overall  self-care retraining, pt/family education, dc planning, balance, activity tolerance   Mobility   CGA<>supervision overall, gait with RW, stairs with rails  supervision overall with LRAD  balance, NMR, awareness, memory, pt/family education, d/c planning, gait, stairs   Communication   supervision A  Supervision - goal met      Safety/Cognition/ Behavioral Observations  Min A (average), 26 out 30 on SLUMs but still impairments in memory, attention, safety awareness, problem solving  Supervision A  eductaion pertaining to deficits   Pain   C/O headache, has sch med  Pain less than 3  Assess pain q shift and prn   Skin   Skin  intact  Prevent skin breakdown  Assess skin q shift and prn     Team Discussion:  Discharge Planning/Teaching Needs:  D/c to home with support from various family members. Wife does still work during the day.  Family education as recommended by therapy. Fam edu Wed 7/28 9am-12pm.   Current Update:  None  Current Barriers to Discharge:  No barriers noted.  Possible Resolutions to Barriers: none  Patient on target to meet rehab goals: yes, Continent B/B, Cognition and safety awareness improving. SLP intermit supervision, trialing regular textures tomorrow.   *See Care Plan and progress notes for long and short-term goals.   Revisions to Treatment Plan:  Dc'd telesitter, IV, use safety belt, vs chair alarm,    Medical Summary Current Status: Continue safety belt, wife needs medical update, safty awareness improving, labs and vitals stable, dysphagia 3/thin diet Weekly Focus/Goal: May d/c telesitter, will provide medical update to wife today, advance diet as tolerated, vitals TID, labs weekly  Barriers to Discharge: Medical stability;Decreased family/caregiver support;Home enviroment access/layout  Barriers to Discharge Comments: higher level attention deficits, impulsivity, impaired problem solving Possible Resolutions to Barriers: Family education   Continued Need for Acute Rehabilitation Level of Care: The patient requires daily medical management by a physician with specialized training in physical medicine and rehabilitation for the following reasons: Direction of a multidisciplinary physical rehabilitation program to maximize functional independence : Yes Medical management of patient stability for increased activity during participation in an intensive rehabilitation regime.: Yes Analysis of laboratory values and/or radiology reports with  any subsequent need for medication adjustment and/or medical intervention. : Yes   I attest that I was present, lead the team conference,  and concur with the assessment and plan of the team.   Cristi Loron 11/21/2019, 2:34 PM

## 2019-11-21 NOTE — Progress Notes (Signed)
[facility] PHYSICAL MEDICINE & REHABILITATION PROGRESS NOTE   Subjective/Complaints: No more headache Happy to go home soon. His wife would like to know when Harlan Stains will be seeing him as she would like to be here at the same time- I will let Auria know and will call the wife with update today  ROS: Denies CP, SOB, N/V/D  Objective:   No results found. No results for input(s): WBC, HGB, HCT, PLT in the last 72 hours. Recent Labs    11/20/19 0646  NA 138  K 4.4  CL 107  CO2 21*  GLUCOSE 108*  BUN 23  CREATININE 1.22  CALCIUM 9.5    Intake/Output Summary (Last 24 hours) at 11/21/2019 0903 Last data filed at 11/20/2019 1700 Gross per 24 hour  Intake 480 ml  Output --  Net 480 ml     Physical Exam: Vital Signs Blood pressure (!) 137/90, pulse 84, temperature 97.9 F (36.6 C), temperature source Oral, resp. rate 17, height 5\' 9"  (1.753 m), weight 70.3 kg, SpO2 98 %. General: Alert and oriented x 3, No apparent distress HEENT: Head is normocephalic, atraumatic, PERRLA, EOMI, sclera anicteric, oral mucosa pink and moist, dentition intact, ext ear canals clear,  Neck: Supple without JVD or lymphadenopathy Heart: Reg rate and rhythm. No murmurs rubs or gallops Chest: CTA bilaterally without wheezes, rales, or rhonchi; no distress Abdomen: Soft, non-tender, non-distended, bowel sounds positive. Extremities: No clubbing, cyanosis, or edema. Pulses are 2+ Skin: Clean and intact without signs of breakdown Neuro: Pt is cognitively appropriate with normal insight, memory, and awareness. Cranial nerves 2-12 are intact. Sensory exam is normal. Reflexes are 2+ in all 4's. Fine motor coordination is intact. No tremors. Motor function is grossly 5/5.  Musculoskeletal: Full ROM, No pain with AROM or PROM in the neck, trunk, or extremities. Posture appropriate Psych: Pt's affect is appropriate. Pt is cooperative   Assessment/Plan: 1. Functional deficits secondary to TBI which require 3+  hours per day of interdisciplinary therapy in a comprehensive inpatient rehab setting.  Physiatrist is providing close team supervision and 24 hour management of active medical problems listed below.  Physiatrist and rehab team continue to assess barriers to discharge/monitor patient progress toward functional and medical goals  Care Tool:  Bathing  Bathing activity did not occur: Refused Body parts bathed by patient: Right arm, Left arm, Chest, Abdomen, Front perineal area, Buttocks, Right upper leg, Left upper leg, Face, Left lower leg, Right lower leg         Bathing assist Assist Level: Contact Guard/Touching assist     Upper Body Dressing/Undressing Upper body dressing   What is the patient wearing?: Button up shirt    Upper body assist Assist Level: Contact Guard/Touching assist (standing)    Lower Body Dressing/Undressing Lower body dressing      What is the patient wearing?: Pants, Underwear/pull up     Lower body assist Assist for lower body dressing: Contact Guard/Touching assist     Toileting Toileting Toileting Activity did not occur (Clothing management and hygiene only): Refused  Toileting assist Assist for toileting: Contact Guard/Touching assist     Transfers Chair/bed transfer  Transfers assist     Chair/bed transfer assist level: Contact Guard/Touching assist     Locomotion Ambulation   Ambulation assist      Assist level: Supervision/Verbal cueing Assistive device: Walker-rolling Max distance: 150   Walk 10 feet activity   Assist     Assist level: Supervision/Verbal cueing Assistive device: Walker-rolling  Walk 50 feet activity   Assist    Assist level: Supervision/Verbal cueing Assistive device: Walker-rolling    Walk 150 feet activity   Assist    Assist level: Supervision/Verbal cueing Assistive device: Walker-rolling    Walk 10 feet on uneven surface  activity   Assist     Assist level: Minimal  Assistance - Patient > 75% Assistive device: Other (comment) (railing on ramp)   Wheelchair     Assist Will patient use wheelchair at discharge?: No             Wheelchair 50 feet with 2 turns activity    Assist            Wheelchair 150 feet activity     Assist          Blood pressure (!) 137/90, pulse 84, temperature 97.9 F (36.6 C), temperature source Oral, resp. rate 17, height 5\' 9"  (1.753 m), weight 70.3 kg, SpO2 98 %.  Medical Problem List and Plan: 1.  Balance deficits, flexed posture, shuffling gait, cognitive deficits, impairments in attention, dysphagia secondary to TBI with bilateral SAH. Per wife pt with increased HA and dizziness over the weekend no falls no blood thinners   Continue CIR- plan is for d/c 7/29  -recent CT demonstrates evolution of SDH, SAH, contusions and not "strokes" as identified on report. 2.  Antithrombotics: -DVT/anticoagulation:  Mechanical: Sequential compression devices, below knee Bilateral lower extremities             -antiplatelet therapy: N/A 3. Persistent Headaches/Pain Management: Oxycodone prn.               -plan to transition to nonnarcotics given history.  Topamax trial for headaches 25mg  bid started 7/19 with some benefit, increased to 50 BID  Controlled 7/27 4. Mood: LCSW to follow for evaluation and support.              -antipsychotic agents: N/A  -continue sleep chart 5. Neuropsych: This patient is not capable of making decisions on his own behalf.  Continue telesitter for safety 6. Skin/Wound Care: Routine Routine pressure relief measures.  7. Fluids/Electrolytes/Nutrition: doesn't like diet, has sore throat  Trial of megace   Oral intake improved 8. Tobacco abuse and Alcohol abuse with DTs: resumed thiamine and folic acid.   -clonidine tapered off.               -pt reports that he has a daily whiskey and coke at home. 9.  Dysphagia: Diet modified advanced to D3 thins.             Advance diet  as tolerated per SLP 10. H/o RA: Managed with Meloxicam--changed to low dose prednisone, 1mg  no joint pain- f/u PCP/RHeum 11. Hyponatremia/Hypomagnesemia: Likely due to ETOH abuse.              Sodium 133 on 7/22, 138 on 7/26 12. Hepatic steatosis/Chronic pancreatitis: Continue Thiamine and Folic acid.              LFT's nl 7/19    LOS: 11 days A FACE TO FACE EVALUATION WAS PERFORMED  Haydon Dorris P Adella Manolis 11/21/2019, 9:03 AM

## 2019-11-21 NOTE — Progress Notes (Signed)
Speech Language Pathology Daily Session Note  Patient Details  Name: [name] MRN: [id number] Date of Birth: [birth year]  Today's Date: 11/21/2019 SLP Individual Time: 1003-1029 SLP Individual Time Calculation (min): 26 min  Short Term Goals: Week 2: SLP Short Term Goal 1 (Week 2): STGs=LTGs due to ELOS  Skilled Therapeutic Interventions: Skilled ST services focused on swallow and cognitive skills. SLP facilitated PO consumption of dys 3 and then regular textures snack. Pt demonstrated appropriate oral clearance and swallow appeared timely. SLP reduced supervision to intermittent on current diet and will assess regular texture advancement with trial try in next session piror to upgrade. SLP also facilitated reassessment of cognitive linguistic skills utilizing SLUMs, pt scored 26 out 30 (n=>27), great improvement from evaluation with a score of 17 out 30. SLP provided education on continued deficits in short term recall, problem solving, higher level attention and safety awareness. Pt was left in room with call bell within reach and chair alarm set. ST recommends to continue skilled ST services.      Pain Pain Assessment Pain Scale: 0-10 Pain Score: 0-No pain  Therapy/Group: Individual Therapy  Deanette Tullius  [facility] 11/21/2019, 10:57 AM

## 2019-11-22 ENCOUNTER — Ambulatory Visit (HOSPITAL_COMMUNITY): Payer: 59 | Admitting: Physical Therapy

## 2019-11-22 ENCOUNTER — Encounter (HOSPITAL_COMMUNITY): Payer: 59

## 2019-11-22 ENCOUNTER — Inpatient Hospital Stay (HOSPITAL_COMMUNITY): Payer: 59 | Admitting: Physical Therapy

## 2019-11-22 NOTE — Progress Notes (Signed)
Patient ID: William Gibson, male   DOB: 24-Apr-1946, 74 y.o.   MRN: [id number]  SW spoke with pt dtr Alyse Low to discuss concerns related from benefits of OPT vs HH. SW provided updates from team conference and DME that will be ordered and outpatient therapies that will be put in place.   SW faxed outpatient referral to [facility] for PT/OT/SLP (p:[phone]/f:[phone]).  SW called pt wife to discuss above with regard to outpatient referral, and DME: [employer] . She stated she will be picking him up tomorrow. She state after speaking with therapy they recommended using a RW. She states she may have one. SW informed DME will be ordered and if she has item, the item can be returned.   Loralee Pacas, MSW, [place] Office: [phone] Cell: [phone] Fax: [phone]

## 2019-11-22 NOTE — Progress Notes (Signed)
Occupational Therapy Session Note  Patient Details  Name: William Gibson MRN: [id number] Date of Birth: 1945/08/02  Today's Date: 11/22/2019 OT Individual Time: 7289-7915 OT Individual Time Calculation (min): 60 min    Short Term Goals: Week 1:  OT Short Term Goal 1 (Week 1): Pt will stand at sink wiht MIN A for grooming to demo improved balance OT Short Term Goal 1 - Progress (Week 1): Met OT Short Term Goal 2 (Week 1): pt will complete ambualtory transfer to toilet iwht MIN A and LRAD OT Short Term Goal 2 - Progress (Week 1): Met OT Short Term Goal 3 (Week 1): Pt will sequence bathing 10/10 body parts with min VC OT Short Term Goal 3 - Progress (Week 1): Met  Skilled Therapeutic Interventions/Progress Updates:    1;1. Pt and wife NOT present for family education Upon calling later with wife, "Im not feeling well, I think its nerves. Its coming from both ends if you know what I mean." Pt with no pain this morning. Pt set up for both walk in shower transfer and step over tub ledge transfer as well as all mobilty. Pt requires intermittent cuing to reach back to seat for safety and L attention when walking in hallway. Pt completes alternating attention/visuospatial task with 3 tasks: card matching, lego figure building, and pipe tree figure for visual attention. Pt able to complete with no VC and is able to switch directions/order when told with no VC for RW management. Pt completes 2 task alternating attention with shift in challenge to visual memory between pipe tree figure and lego block. Pt able to compelte pipe tree with no errors but requires MAX VC and use of visual aide to recall how to build lego figure selection. Pt wife reached via telephone and would prefer written instructions for family education to refer back to as needed. Provided info to primary OT for further additions to instrucitons and to pass on to Pt wife. Exited session with pt seated in bed, exi tlaarm on and call light in  reach  Therapy Documentation Precautions:  Precautions Precautions: Fall, Other (comment) Precaution Comments: forward trunk flexion in standing/gait Restrictions Weight Bearing Restrictions: No General:   Vital Signs:   Pain:   ADL: ADL Eating: Independent Grooming: Independent Upper Body Bathing: Supervision/safety Lower Body Bathing: Supervision/safety Upper Body Dressing: Supervision/safety Lower Body Dressing: Supervision/safety Toileting: Supervision/safety Toilet Transfer: Close supervision Gaffer Transfer: Close supervision Vision   Perception    Praxis   Exercises:   Other Treatments:     Therapy/Group: Individual Therapy  Tonny Branch 11/22/2019, 8:40 AM

## 2019-11-22 NOTE — Progress Notes (Signed)
Speech Language Pathology Discharge Summary  Patient Details  Name: William Gibson MRN: [id number] Date of Birth: 1945-10-14  Today's Date: 11/22/2019 SLP Individual Time: 1132-1200 SLP Individual Time Calculation (min): 28 min   Skilled Therapeutic Interventions:  Skilled ST services focused on education and swallow skills. Pt's wife was not present for education, therefore education was provided to pt and written handout given. SLP facilitated PO consumption of regular trial lunch tray. Pt demonstrated prolonged mastication with advanced textures, however was able to clear oral cavity, swallow appeared timely and no overt s/s aspiration. SLP upgraded diet to regular textures and thin liquids with intermittent supervision due to discharge plan tomorrow. SLP provided education pertaining to swallow strategies and continued cognitive linguistic deficits. All questions answered to satisfaction. Pt was left in room with call bell within reach and bed alarm set.     Patient has met 8 of 8 long term goals.  Patient to discharge at overall Min;Supervision level.  Reasons goals not met:     Clinical Impression/Discharge Summary:   Pt made great progress meeting 6 out 6 goals discharging at min-supervision A. Pt demonstrated dramatic improvements in cognitive skills, scoring 17 out 30 on SLUMS at evaluation and at discharge 26 ot 30 (n=>26). Pt demonstrated improvements in sustained/selective attention, basic problem solving, emergent awareness, safety awareness and short term recall in functional tasks such as medication management. Pt demonstrated improvement is swallow function discharging at a diet of regular textures and thin liquids with intermittent supervision A to limit distractions, check of oral clearance and preform effortful swallow. SLP recommends to continue skilled ST services to assessment diet tolerance and further cognitive linguistic skills. Family education was completed via handouts,  highlighting need for 24 hour supervision due to safety awareness as well as other deficits. Family did not attend scheduled education. Pt benefited from skills ST services in order to maximize functional independence and reduce burden of care, requiring 24 hour supervision and continued ST services.  Care Partner:  Caregiver Able to Provide Assistance: Yes  Type of Caregiver Assistance: Physical;Cognitive  Recommendation:  Home Health SLP;Outpatient SLP;24 hour supervision/assistance  Rationale for SLP Follow Up: Maximize cognitive function and independence;Maximize swallowing safety;Maximize functional communication;Reduce caregiver burden   Equipment: N/A   Reasons for discharge: Discharged from hospital   Patient/Family Agrees with Progress Made and Goals Achieved: Yes    Petula Rotolo  [facility] 11/22/2019, 12:45 PM

## 2019-11-22 NOTE — Progress Notes (Signed)
[place] PHYSICAL MEDICINE & REHABILITATION PROGRESS NOTE   Subjective/Complaints: Denies complaints. I spoke with wife by phone yesterday and reassured her that I would complete FMLA paperwork for her- will do so tmw morning with patient and wife at 8:30am.   ROS: Denies CP, SOB, N/V/D  Objective:   No results found. No results for input(s): WBC, HGB, HCT, PLT in the last 72 hours. Recent Labs    11/20/19 0646  NA 138  K 4.4  CL 107  CO2 21*  GLUCOSE 108*  BUN 23  CREATININE 1.22  CALCIUM 9.5    Intake/Output Summary (Last 24 hours) at 11/22/2019 1955 Last data filed at 11/22/2019 1700 Gross per 24 hour  Intake 840 ml  Output 425 ml  Net 415 ml     Physical Exam: Vital Signs Blood pressure 115/84, pulse 91, temperature 98.2 F (36.8 C), temperature source Oral, resp. rate 16, height 5\' 9"  (1.753 m), weight 70.1 kg, SpO2 97 %. General: Alert and oriented x 3, No apparent distress HEENT: Head is normocephalic, atraumatic, PERRLA, EOMI, sclera anicteric, oral mucosa pink and moist, dentition intact, ext ear canals clear,  Neck: Supple without JVD or lymphadenopathy Heart: Reg rate and rhythm. No murmurs rubs or gallops Chest: CTA bilaterally without wheezes, rales, or rhonchi; no distress Abdomen: Soft, non-tender, non-distended, bowel sounds positive. Extremities: No clubbing, cyanosis, or edema. Pulses are 2+ Skin: Clean and intact without signs of breakdown Neuro: Pt is cognitively appropriate with normal insight, memory, and awareness. Cranial nerves 2-12 are intact. Sensory exam is normal. Reflexes are 2+ in all 4's. Fine motor coordination is intact. No tremors. Motor function is grossly 5/5.  Musculoskeletal: Full ROM, No pain with AROM or PROM in the neck, trunk, or extremities. Posture appropriate Psych: Pt's affect is appropriate. Pt is cooperative  Assessment/Plan: 1. Functional deficits secondary to TBI which require 3+ hours per day of interdisciplinary  therapy in a comprehensive inpatient rehab setting.  Physiatrist is providing close team supervision and 24 hour management of active medical problems listed below.  Physiatrist and rehab team continue to assess barriers to discharge/monitor patient progress toward functional and medical goals  Care Tool:  Bathing  Bathing activity did not occur: Refused Body parts bathed by patient: Right arm, Left arm, Chest, Abdomen, Buttocks, Right upper leg, Left lower leg, Right lower leg, Left upper leg, Front perineal area, Face         Bathing assist Assist Level: Supervision/Verbal cueing     Upper Body Dressing/Undressing Upper body dressing   What is the patient wearing?: Pull over shirt, Button up shirt    Upper body assist Assist Level: Supervision/Verbal cueing    Lower Body Dressing/Undressing Lower body dressing      What is the patient wearing?: Underwear/pull up, Pants     Lower body assist Assist for lower body dressing: Supervision/Verbal cueing     Toileting Toileting Toileting Activity did not occur (Clothing management and hygiene only): Refused  Toileting assist Assist for toileting: Supervision/Verbal cueing     Transfers Chair/bed transfer  Transfers assist     Chair/bed transfer assist level: Supervision/Verbal cueing     Locomotion Ambulation   Ambulation assist      Assist level: Supervision/Verbal cueing Assistive device: Walker-rolling Max distance: 150   Walk 10 feet activity   Assist     Assist level: Supervision/Verbal cueing Assistive device: Walker-rolling   Walk 50 feet activity   Assist    Assist level: Supervision/Verbal cueing  Assistive device: Walker-rolling    Walk 150 feet activity   Assist    Assist level: Supervision/Verbal cueing Assistive device: Walker-rolling    Walk 10 feet on uneven surface  activity   Assist     Assist level: Supervision/Verbal cueing Assistive device: Horticulturist, commercial Will patient use wheelchair at discharge?: No   Wheelchair activity did not occur: N/A (pt ambulatory)         Wheelchair 50 feet with 2 turns activity    Assist    Wheelchair 50 feet with 2 turns activity did not occur: N/A       Wheelchair 150 feet activity     Assist  Wheelchair 150 feet activity did not occur: N/A       Blood pressure 115/84, pulse 91, temperature 98.2 F (36.8 C), temperature source Oral, resp. rate 16, height 5\' 9"  (1.753 m), weight 70.1 kg, SpO2 97 %.  Medical Problem List and Plan: 1.  Balance deficits, flexed posture, shuffling gait, cognitive deficits, impairments in attention, dysphagia secondary to TBI with bilateral SAH. Per wife pt with increased HA and dizziness over the weekend no falls no blood thinners   Continue CIR- plan is for d/c 7/29  -recent CT demonstrates evolution of SDH, SAH, contusions and not "strokes" as identified on report. 2.  Antithrombotics: -DVT/anticoagulation:  Mechanical: Sequential compression devices, below knee Bilateral lower extremities             -antiplatelet therapy: N/A 3. Persistent Headaches/Pain Management: Oxycodone prn.               -plan to transition to nonnarcotics given history.  Topamax trial for headaches 25mg  bid started 7/19 with some benefit, increased to 50 BID  Controlled 7/28. 4. Mood: LCSW to follow for evaluation and support. In positive mood.             -antipsychotic agents: N/A  -continue sleep chart 5. Neuropsych: This patient is not capable of making decisions on his own behalf.  Continue telesitter for safety 6. Skin/Wound Care: Routine Routine pressure relief measures.  7. Fluids/Electrolytes/Nutrition: doesn't like diet, has sore throat  Trial of megace   Oral intake improved 8. Tobacco abuse and Alcohol abuse with DTs: resumed thiamine and folic acid.   -clonidine tapered off.               -pt reports that he has a daily whiskey and  coke at home. 9.  Dysphagia: Diet modified advanced to D3 thins.             Advance diet as tolerated per SLP 10. H/o RA: Managed with Meloxicam--changed to low dose prednisone, 1mg  no joint pain- f/u PCP/RHeum 11. Hyponatremia/Hypomagnesemia: Likely due to ETOH abuse.              Sodium 133 on 7/22, 138 on 7/26 12. Hepatic steatosis/Chronic pancreatitis: Continue Thiamine and Folic acid.              LFT's nl 7/19    LOS: 12 days A FACE TO FACE EVALUATION WAS PERFORMED  Clide Deutscher Faige Seely 11/22/2019, 7:55 PM

## 2019-11-22 NOTE — Plan of Care (Signed)
Problem: RH Balance Goal: LTG: Patient will maintain dynamic sitting balance (OT) Description: LTG:  Patient will maintain dynamic sitting balance with assistance during activities of daily living (OT) Outcome: Completed/Met Goal: LTG Patient will maintain dynamic standing with ADLs (OT) Description: LTG:  Patient will maintain dynamic standing balance with assist during activities of daily living (OT)  Outcome: Completed/Met   Problem: RH Eating Goal: LTG Patient will perform eating w/assist, cues/equip (OT) Description: LTG: Patient will perform eating with assist, with/without cues using equipment (OT) Outcome: Completed/Met   Problem: RH Grooming Goal: LTG Patient will perform grooming w/assist,cues/equip (OT) Description: LTG: Patient will perform grooming with assist, with/without cues using equipment (OT) Outcome: Completed/Met   Problem: RH Bathing Goal: LTG Patient will bathe all body parts with assist levels (OT) Description: LTG: Patient will bathe all body parts with assist levels (OT) Outcome: Completed/Met   Problem: RH Dressing Goal: LTG Patient will perform upper body dressing (OT) Description: LTG Patient will perform upper body dressing with assist, with/without cues (OT). Outcome: Completed/Met Goal: LTG Patient will perform lower body dressing w/assist (OT) Description: LTG: Patient will perform lower body dressing with assist, with/without cues in positioning using equipment (OT) Outcome: Completed/Met   Problem: RH Toileting Goal: LTG Patient will perform toileting task (3/3 steps) with assistance level (OT) Description: LTG: Patient will perform toileting task (3/3 steps) with assistance level (OT)  Outcome: Completed/Met   Problem: RH Toilet Transfers Goal: LTG Patient will perform toilet transfers w/assist (OT) Description: LTG: Patient will perform toilet transfers with assist, with/without cues using equipment (OT) Outcome: Completed/Met    Problem: RH Tub/Shower Transfers Goal: LTG Patient will perform tub/shower transfers w/assist (OT) Description: LTG: Patient will perform tub/shower transfers with assist, with/without cues using equipment (OT) Outcome: Completed/Met   Problem: RH Memory Goal: LTG Patient will demonstrate ability for day to day recall/carry over during activities of daily living with assistance level (OT) Description: LTG:  Patient will demonstrate ability for day to day recall/carry over during activities of daily living with assistance level (OT). Outcome: Completed/Met   Problem: RH Attention Goal: LTG Patient will demonstrate this level of attention during functional activites (OT) Description: LTG:  Patient will demonstrate this level of attention during functional activites  (OT) Outcome: Completed/Met   Problem: RH Awareness Goal: LTG: Patient will demonstrate awareness during functional activites type of (OT) Description: LTG: Patient will demonstrate awareness during functional activites type of (OT) Outcome: Completed/Met   

## 2019-11-22 NOTE — Progress Notes (Signed)
Physical Therapy Session Note  Patient Details  Name: William Gibson MRN: [id number] Date of Birth: 05-05-1945  Today's Date: 11/22/2019 PT Individual Time: 5009-3818 and 2993-7169 PT Individual Time Calculation (min): 70 min and 40 min  Short Term Goals: Week 2:  PT Short Term Goal 1 (Week 2): STG = LTG due to estimated d/c date.  Skilled Therapeutic Interventions/Progress Updates:  Treatment 1: Pt received in room & agreeable to tx. No c/o pain reported. Pt's wife not present for family ed as pt reports she's home sick 2/2 anxiety. PT called William Gibson on phone & she requested I write down all information for her so PT documented safety recommendations & gave pt copy of handout (paper copy in pt's paper chart). Pt completes transfers throughout session with supervision, ambulating around unit with RW & supervision. Pt negotiates ramp & mulch with RW & close supervision, completes car transfer at elevated SUV height with supervision, and bed mobility independently. Pt negotiates 12 steps (6") with B rails & supervision. Issued pt [place] Level B HEP & pt performed all exercises with PT providing instructional cuing & educating him on exercises. Pt utilized kinetron in standing with BUE support & task focusing on BLE strengthening with pt taking rest breaks PRN 2/2 his "knees giving out"; pt provides cuing for upright posture but poor demo from pt. At end of session pt left sitting on EOB awaiting SLP arrival.  Treatment 2: Pt received in bed & agreeable to tx. Contacted pt's wife via telephone per her request but she appears to not recall speaking to PT earlier this morning. Educated her on pt's need for supervision at all times at d/c, mobility with RW at supervision level including car transfer & reviewed d/c tomorrow. Pt's wife tearful but reports she's okay & phone call ended. Pt transfers [place] then ambulates into bathroom with RW & supervision. Pt stands & has continent void & performs  hand hygiene standing with supervision. Gait from unit<>outside [employer]	[place] with focus on community mobility with pt becoming impulsive & in a hurry when getting onto elevator & ambulating into lobby. PT educated pt on need to reduce speed & be mindful of transitions over various surfaces, especially rugs as his RW caught on one, as well as cuing to ambulate within base of AD. Pt with improving safety with mobility when ambulating back through lobby & elevator. Pt does require cuing to ambulate within base of AD vs pushing it out in front & leaning on it. Reviewed d/c tomorrow & pt voices no concerns re: d/c. Pt does demonstrate still decreased safety awareness as he pushes RW to side and ambulates a few steps to sit in w/c with PT reiterating need to use AD at all times. Pt left in w/c with alarm belt donned & call bell & all needs in reach.  Therapy Documentation Precautions:  Precautions Precautions: Fall, Other (comment) Precaution Comments: forward trunk flexion in standing/gait Restrictions Weight Bearing Restrictions: No   Therapy/Group: Individual Therapy  William Gibson 11/22/2019, 2:50 PM

## 2019-11-22 NOTE — Plan of Care (Signed)
  Problem: Consults Goal: RH BRAIN INJURY PATIENT EDUCATION Description: Description: See Patient Education module for eduction specifics Outcome: Progressing Goal: Skin Care Protocol Initiated - if Braden Score 18 or less Description: If consults are not indicated, leave blank or document N/A Outcome: Progressing Goal: Nutrition Consult-if indicated Outcome: Progressing   Problem: RH BOWEL ELIMINATION Goal: RH STG MANAGE BOWEL WITH ASSISTANCE Description: STG Manage Bowel with Assistance. Min Outcome: Progressing Goal: RH STG MANAGE BOWEL W/MEDICATION W/ASSISTANCE Description: STG Manage Bowel with Medication with Assistance. Min Outcome: Progressing   Problem: RH BLADDER ELIMINATION Goal: RH STG MANAGE BLADDER WITH ASSISTANCE Description: STG Manage Bladder With Assistance Min Outcome: Progressing   Problem: RH SKIN INTEGRITY Goal: RH STG SKIN FREE OF INFECTION/BREAKDOWN Description: With moderate assistance. Outcome: Progressing   Problem: RH SAFETY Goal: RH STG ADHERE TO SAFETY PRECAUTIONS W/ASSISTANCE/DEVICE Description: STG Adhere to Safety Precautions With Assistance/Device. Mod Outcome: Progressing

## 2019-11-23 MED ORDER — PANTOPRAZOLE SODIUM 40 MG PO TBEC: 40.0000 mg | DELAYED_RELEASE_TABLET | Freq: Every day | ORAL | 0 refills | Status: DC

## 2019-11-23 MED ORDER — MEGESTROL ACETATE 400 MG/10ML PO SUSP: 400.0000 mg | Freq: Two times a day (BID) | ORAL | 0 refills | Status: DC

## 2019-11-23 MED ORDER — NICOTINE 21 MG/24HR TD PT24: 21.0000 mg | MEDICATED_PATCH | Freq: Every day | TRANSDERMAL | 0 refills | Status: DC

## 2019-11-23 MED ORDER — TRAZODONE HCL 50 MG PO TABS: 25.0000 mg | ORAL_TABLET | Freq: Every evening | ORAL | 0 refills | Status: DC | PRN

## 2019-11-23 MED ORDER — ESCITALOPRAM OXALATE 10 MG PO TABS
10.0000 mg | ORAL_TABLET | Freq: Every day | ORAL | 1 refills | Status: DC
Start: 2019-11-23 — End: 2019-12-26

## 2019-11-23 MED ORDER — PREDNISONE 1 MG PO TABS: 0.5000 mg | ORAL_TABLET | Freq: Every day | ORAL | 0 refills | Status: DC

## 2019-11-23 MED ORDER — FOLIC ACID 1 MG PO TABS: 1.0000 mg | ORAL_TABLET | Freq: Every day | ORAL | 0 refills | Status: DC

## 2019-11-23 MED ORDER — VITAMIN B-1 100 MG PO TABS: 100.0000 mg | ORAL_TABLET | Freq: Every day | ORAL | 0 refills | Status: DC

## 2019-11-23 MED ORDER — TOPIRAMATE 50 MG PO TABS: 50.0000 mg | ORAL_TABLET | Freq: Two times a day (BID) | ORAL | 0 refills | Status: DC

## 2019-11-23 NOTE — Progress Notes (Signed)
[facility] PHYSICAL MEDICINE & REHABILITATION PROGRESS NOTE   Subjective/Complaints: Denies complaints. I have completed [facility] paperwork and handed to his wife. His wife asks about medication for his anxiety as he will be very anxious that he needs to stay in the house more due to the heat.   ROS: Denies CP, SOB, N/V/D  Objective:   No results found. No results for input(s): WBC, HGB, HCT, PLT in the last 72 hours. No results for input(s): NA, K, CL, CO2, GLUCOSE, BUN, CREATININE, CALCIUM in the last 72 hours.  Intake/Output Summary (Last 24 hours) at 11/23/2019 0900 Last data filed at 11/23/2019 0700 Gross per 24 hour  Intake 600 ml  Output --  Net 600 ml     Physical Exam: Vital Signs Blood pressure (!) 125/88, pulse 84, temperature 97.7 F (36.5 C), temperature source Oral, resp. rate 16, height 5\' 9"  (1.753 m), weight 70.1 kg, SpO2 98 %.  General: Alert and oriented x 3, No apparent distress HEENT: Head is normocephalic, atraumatic, PERRLA, EOMI, sclera anicteric, oral mucosa pink and moist, dentition intact, ext ear canals clear,  Neck: Supple without JVD or lymphadenopathy Heart: Reg rate and rhythm. No murmurs rubs or gallops Chest: CTA bilaterally without wheezes, rales, or rhonchi; no distress Abdomen: Soft, non-tender, non-distended, bowel sounds positive. Extremities: No clubbing, cyanosis, or edema. Pulses are 2+ Skin: Clean and intact without signs of breakdown Neuro: Pt is cognitively appropriate with normal insight, memory, and awareness. Cranial nerves 2-12 are intact. Sensory exam is normal. Reflexes are 2+ in all 4's. Fine motor coordination is intact. No tremors. Motor function is grossly 5/5.  Musculoskeletal: Full ROM, No pain with AROM or PROM in the neck, trunk, or extremities. Posture appropriate Psych: Pt's affect is appropriate. Pt is cooperative   Assessment/Plan: 1. Functional deficits secondary to TBI which require 3+ hours per day of  interdisciplinary therapy in a comprehensive inpatient rehab setting.  Physiatrist is providing close team supervision and 24 hour management of active medical problems listed below.  Physiatrist and rehab team continue to assess barriers to discharge/monitor patient progress toward functional and medical goals  Care Tool:  Bathing  Bathing activity did not occur: Refused Body parts bathed by patient: Right arm, Left arm, Chest, Abdomen, Buttocks, Right upper leg, Left lower leg, Right lower leg, Left upper leg, Front perineal area, Face         Bathing assist Assist Level: Supervision/Verbal cueing     Upper Body Dressing/Undressing Upper body dressing   What is the patient wearing?: Pull over shirt, Button up shirt    Upper body assist Assist Level: Supervision/Verbal cueing    Lower Body Dressing/Undressing Lower body dressing      What is the patient wearing?: Underwear/pull up, Pants     Lower body assist Assist for lower body dressing: Supervision/Verbal cueing     Toileting Toileting Toileting Activity did not occur (Clothing management and hygiene only): Refused  Toileting assist Assist for toileting: Supervision/Verbal cueing     Transfers Chair/bed transfer  Transfers assist     Chair/bed transfer assist level: Supervision/Verbal cueing     Locomotion Ambulation   Ambulation assist      Assist level: Supervision/Verbal cueing Assistive device: Walker-rolling Max distance: 150   Walk 10 feet activity   Assist     Assist level: Supervision/Verbal cueing Assistive device: Walker-rolling   Walk 50 feet activity   Assist    Assist level: Supervision/Verbal cueing Assistive device: Walker-rolling  Walk 150 feet activity   Assist    Assist level: Supervision/Verbal cueing Assistive device: Walker-rolling    Walk 10 feet on uneven surface  activity   Assist     Assist level: Supervision/Verbal cueing Assistive device:  Walker-rolling   Wheelchair     Assist Will patient use wheelchair at discharge?: No   Wheelchair activity did not occur: N/A (pt ambulatory)         Wheelchair 50 feet with 2 turns activity    Assist    Wheelchair 50 feet with 2 turns activity did not occur: N/A       Wheelchair 150 feet activity     Assist  Wheelchair 150 feet activity did not occur: N/A       Blood pressure (!) 125/88, pulse 84, temperature 97.7 F (36.5 C), temperature source Oral, resp. rate 16, height 5\' 9"  (1.753 m), weight 70.1 kg, SpO2 98 %.  Medical Problem List and Plan: 1.  Balance deficits, flexed posture, shuffling gait, cognitive deficits, impairments in attention, dysphagia secondary to TBI with bilateral SAH. Per wife pt with increased HA and dizziness over the weekend no falls no blood thinners   DC home with wife today, f/u with me in 2 weeks.   -recent CT demonstrates evolution of SDH, SAH, contusions and not "strokes" as identified on report. 2.  Antithrombotics: -DVT/anticoagulation:  Mechanical: Sequential compression devices, below knee Bilateral lower extremities             -antiplatelet therapy: N/A 3. Persistent Headaches/Pain Management: No need for oxy  Topamax trial for headaches 25mg  bid started 7/19 with some benefit, increased to 50 BID  Controlled 7/29 4. Mood: LCSW to follow for evaluation and support. In positive mood.             -antipsychotic agents: N/A  -continue sleep chart  -start Lexapro low dose daily for anxiety. 5. Neuropsych: This patient is not capable of making decisions on his own behalf.  Continue telesitter for safety 6. Skin/Wound Care: Routine Routine pressure relief measures.  7. Fluids/Electrolytes/Nutrition: doesn't like diet, has sore throat  Trial of megace   Oral intake improved 8. Tobacco abuse and Alcohol abuse with DTs: resumed thiamine and folic acid.   -clonidine tapered off.               -pt reports that he has a daily  whiskey and coke at home. 9.  Dysphagia: Diet modified advanced to D3 thins.             Advance diet as tolerated per SLP 10. H/o RA: Managed with Meloxicam--changed to low dose prednisone, 1mg  no joint pain- f/u PCP/[facility] 11. Hyponatremia/Hypomagnesemia: Likely due to ETOH abuse.              Sodium 133 on 7/22, 138 on 7/26 12. Hepatic steatosis/Chronic pancreatitis: Continue Thiamine and Folic acid.              LFT's nl 7/19 13. Disposition:completedFMLApaperwork for wife   >30 minutes spent in discharge of patient including review of medications and follow-up appointments, physical examination, completing FMLA paperwork, discussing anxiety,importance of time outdoors, and in discussion of pain and modification of meloxicam to prednisone due to [facility], and in answering all patient's questions    LOS: 13 days A FACE TO [place] 11/23/2019, 9:00 AM

## 2019-11-23 NOTE — Discharge Instructions (Signed)
Inpatient Rehab Discharge Instructions  [facility] Gibson Discharge date and time:  11/23/19  Activities/Precautions/ Functional Status: Activity: no lifting, driving, or strenuous exercise for till cleared by MD Diet: Regular. Needs to drink liquids with a straw. Medications need to be crushed and administered in puree.  Wound Care: none needed   Functional status:  ___ No restrictions     ___ Walk up steps independently _X__ 24/7 supervision/assistance   ___ Walk up steps with assistance ___ Intermittent supervision/assistance  ___ Bathe/dress independently ___ Walk with walker     ___ Bathe/dress with assistance ___ Walk Independently    ___ Shower independently ___ Walk with assistance    _X__ Shower with assistance _X__ No alcohol     ___ Return to work/school ________   COMMUNITY REFERRALS UPON DISCHARGE:    Outpatient: PT     OT    ST                                    Agency:[facility] Phone: [phone]              Appointment Date/Time:*Please expect follow-up within 7-10 business days to schedule your appointment. If you have not received follow-up, be sure to contact the site directly.*  Medical Equipment/Items Ordered: 3in1 bedside commode, and rolling walker                                                 Agency/Supplier: [employer] [phone]    Special Instructions: 1. No aspirin, ibuprofen, motrin or aspirin containing products.  2. Absolutely no alcohol.  3. Family needs to help administer medications.  4.Will need to call PCP for post hospital follow up in 1-2 weeks.   My questions have been answered and I understand these instructions. I will adhere to these goals and the provided educational materials after my discharge from the hospital.  Patient/Caregiver Signature _______________________________ Date __________  Clinician Signature _______________________________________ Date __________  Please bring this form and your medication list with  you to all your follow-up doctor's appointments.

## 2019-11-23 NOTE — Discharge Summary (Signed)
Physician Discharge Summary  Patient ID: William Gibson MRN: [id number] DOB/AGE: [birth year] 74 y.o.  Admit date: 11/10/2019 Discharge date: 11/23/2019  Discharge Diagnoses:  Principal Problem:   TBI (traumatic brain injury) ([place]) Active Problems:   Alcoholism ([place])   RA (rheumatoid arthritis) ([place])   Dysphagia   Vascular headache   Discharged Condition: stable    Significant Diagnostic Studies: CT HEAD WO CONTRAST  Result Date: 11/13/2019 CLINICAL DATA:  Stroke, follow-up. EXAM: CT HEAD WITHOUT CONTRAST TECHNIQUE: Contiguous axial images were obtained from the base of the skull through the vertex without intravenous contrast. COMPARISON:  11/04/2019 head CT. FINDINGS: Brain: Prior subarachnoid hemorrhage overlying the bilateral cerebral convexities is less conspicuous than prior exam. Increased conspicuity of intermediate density right subdural collection measuring up to 5 mm (5:41). New low-density subdural collection overlying the left cerebral convexity measures up to 6.6 mm (3:21). New low-density appearance of the right greater than left bilateral inferior frontal lobes (3:12, 13). Right cerebellar low-density foci are more conspicuous than prior exam and may reflect evolving infarcts (5:60). No mass lesion. No midline shift or ventriculomegaly. Background scattered and confluent supratentorial white matter hypodense foci likely reflect chronic microvascular ischemic changes. Vascular: No hyperdense vessel. Bilateral carotid siphon atherosclerotic calcifications. Skull: Nondisplaced right parasagittal occipital fracture is unchanged. Sinuses/Orbits: Normal orbits. Right sphenoid sinus mucous retention cyst. No mastoid effusion. Other: Tiny right frontal metallic density is unchanged. IMPRESSION: Acute bilateral inferior frontal infarcts. Evolving right cerebellar hypodensities, possibly subacute infarcts. Increased 5 mm right cerebral convexity subdural collection. New 6.6 mm left cerebral  convexity subdural collection. Bilateral cerebral convexity subarachnoid hemorrhage is less conspicuous than prior exam. These results were called by telephone at the time of interpretation on 11/13/2019 at 9:28 am to provider Alysia Penna , who verbally acknowledged these results. Electronically Signed   By: Primitivo Gauze M.D.   On: 11/13/2019 09:36   DG Swallowing Func-Speech Pathology  Result Date: 11/16/2019 Objective Swallowing Evaluation: Type of Study: MBS-Modified Barium Swallow Study  Patient Details Name: William Gibson MRN: [id number] Date of Birth: 1946/04/21 Today's Date: 11/16/2019 Time: SLP Start Time (ACUTE ONLY): 5170 -SLP Stop Time (ACUTE ONLY): 0930 SLP Time Calculation (min) (ACUTE ONLY): 25 min Past Medical History: Past Medical History: Diagnosis Date . Abnormal liver function tests 12/15/2018 . Alcoholic liver disease ([place]) 12/15/2018 . Alcoholism ([place])  . Elevated LDL cholesterol level 05/22/2013 . Epistaxis  . Erectile dysfunction 05/22/2013 . Frequent falls 11/09/2018 . GERD (gastroesophageal reflux disease)  . Hyperammonemia ([place]) 11/15/2018 . Memory change 08/17/2017 . RA (rheumatoid arthritis) ([place]) 08/17/2017 . RBBB 12/15/2018 . Rheumatoid arthritis ([place]) 2010 . Shuffling gait 12/19/2018 . Tobacco abuse 04/26/2012 . Weight loss, unintentional 11/09/2018 . 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 NASAL ENDOSCOPY WITH EPISTAXIS CONTROL N/A 04/12/2018  Procedure: NASAL ENDOSCOPY WITH EPISTAXIS CONTROL;  Surgeon: Melissa Montane, MD;  Location: [facility];  Service: ENT;  Laterality: N/A; . VASECTOMY   . VASECTOMY REVERSAL   HPI: See H&P  Subjective: alert, cooperative, says his throat hurts Assessment / Plan / Recommendation CHL IP CLINICAL IMPRESSIONS 11/16/2019 Clinical Impression Patient's swallowing function has improved since initial MBS but patient continues to demonstrate a mild oropharyngeal residue.   Oral phase is characterized by lingual residue across all consistencies. Pharyngeal phase is characterized by a delayed swallow initiation and mild vallecular residue that patient independently clears with a second swallow. Patient with 2 episodes of deep, trace  silent penetration of thin liquids via cup, suspect due to hyperextension of neck. Penetration events were eliminated with liquids via straw assisting with head remaining in a neutral position. Recommend patient upgrade to Dys. 3 textures with thin liquids via straw and continue full supervision for utilization of swallowing compensatory strategies. SLP Visit Diagnosis Dysphagia, oropharyngeal phase (R13.12) Attention and concentration deficit following -- Frontal lobe and executive function deficit following -- Impact on safety and function Mild aspiration risk   CHL IP TREATMENT RECOMMENDATION 11/16/2019 Treatment Recommendations Therapy as outlined in treatment plan below   Prognosis 11/16/2019 Prognosis for Safe Diet Advancement Good Barriers to Reach Goals -- Barriers/Prognosis Comment -- CHL IP DIET RECOMMENDATION 11/16/2019 SLP Diet Recommendations Dysphagia 3 (Mech soft) solids;Thin liquid Liquid Administration via Cup;Straw Medication Administration Crushed with puree Compensations Slow rate;Small sips/bites;Minimize environmental distractions;Effortful swallow Postural Changes Seated upright at 90 degrees   CHL IP OTHER RECOMMENDATIONS 11/16/2019 Recommended Consults -- Oral Care Recommendations Oral care BID Other Recommendations --   CHL IP FOLLOW UP RECOMMENDATIONS 11/16/2019 Follow up Recommendations Inpatient Rehab   CHL IP FREQUENCY AND DURATION 11/16/2019 Speech Therapy Frequency (ACUTE ONLY) min 3x week Treatment Duration 2 weeks      CHL IP ORAL PHASE 11/16/2019 Oral Phase Impaired Oral - Pudding Teaspoon -- Oral - Pudding Cup -- Oral - Honey Teaspoon -- Oral - Honey Cup NT Oral - Nectar Teaspoon Lingual/palatal residue Oral - Nectar Cup  Lingual/palatal residue Oral - Nectar Straw -- Oral - Thin Teaspoon WFL Oral - Thin Cup Lingual/palatal residue Oral - Thin Straw Lingual/palatal residue Oral - Puree Lingual/palatal residue Oral - Mech Soft Lingual/palatal residue;Impaired mastication Oral - Regular -- Oral - Multi-Consistency -- Oral - Pill -- Oral Phase - Comment --  CHL IP PHARYNGEAL PHASE 11/16/2019 Pharyngeal Phase Impaired Pharyngeal- Pudding Teaspoon -- Pharyngeal -- Pharyngeal- Pudding Cup -- Pharyngeal -- Pharyngeal- Honey Teaspoon -- Pharyngeal -- Pharyngeal- Honey Cup NT Pharyngeal -- Pharyngeal- Nectar Teaspoon Delayed swallow initiation-vallecula Pharyngeal -- Pharyngeal- Nectar Cup Delayed swallow initiation-vallecula Pharyngeal Material does not enter airway Pharyngeal- Nectar Straw -- Pharyngeal -- Pharyngeal- Thin Teaspoon Delayed swallow initiation-pyriform sinuses Pharyngeal Material does not enter airway Pharyngeal- Thin Cup Delayed swallow initiation-pyriform sinuses;Pharyngeal residue - valleculae;Penetration/Aspiration during swallow Pharyngeal Material does not enter airway;Material enters airway, remains ABOVE vocal cords and not ejected out;Material enters airway, CONTACTS cords and not ejected out Pharyngeal- Thin Straw Delayed swallow initiation-pyriform sinuses;Pharyngeal residue - valleculae Pharyngeal Material does not enter airway Pharyngeal- Puree Delayed swallow initiation-vallecula;Pharyngeal residue - valleculae Pharyngeal -- Pharyngeal- Mechanical Soft Delayed swallow initiation-vallecula;Pharyngeal residue - valleculae Pharyngeal Material does not enter airway Pharyngeal- Regular -- Pharyngeal -- Pharyngeal- Multi-consistency -- Pharyngeal -- Pharyngeal- Pill -- Pharyngeal -- Pharyngeal Comment --  CHL IP CERVICAL ESOPHAGEAL PHASE 11/10/2019 Cervical Esophageal Phase Impaired Pudding Teaspoon -- Pudding Cup -- Honey Teaspoon -- Honey Cup Reduced cricopharyngeal relaxation Nectar Teaspoon Reduced cricopharyngeal  relaxation Nectar Cup Reduced cricopharyngeal relaxation Nectar Straw -- Thin Teaspoon -- Thin Cup -- Thin Straw -- Puree Reduced cricopharyngeal relaxation Mechanical Soft Reduced cricopharyngeal relaxation Regular -- Multi-consistency -- Pill -- Cervical Esophageal Comment -- PAYNE, COURTNEY 11/16/2019, 12:04 PM    Weston Anna, MA, CCC-SLP [phone]           DG Swallowing Func-Speech Pathology  Result Date: 11/10/2019 Objective Swallowing Evaluation: Type of Study: MBS-Modified Barium Swallow Study  Patient Details Name: William Gibson MRN: [id number] Date of Birth: 19-May-1945 Today's Date: 11/10/2019 Time: SLP Start Time (ACUTE ONLY): 1137 -SLP Stop  Time (ACUTE ONLY): 1205 SLP Time Calculation (min) (ACUTE ONLY): 28 min Past Medical History: Past Medical History: Diagnosis Date . Abnormal liver function tests 12/15/2018 . Alcoholic liver disease ([place]) 12/15/2018 . Alcoholism ([place])  . Elevated LDL cholesterol level 05/22/2013 . Epistaxis  . Erectile dysfunction 05/22/2013 . Frequent falls 11/09/2018 . GERD (gastroesophageal reflux disease)  . Hyperammonemia ([place]) 11/15/2018 . Memory change 08/17/2017 . RA (rheumatoid arthritis) ([place]) 08/17/2017 . RBBB 12/15/2018 . Rheumatoid arthritis ([place]) 2010 . Shuffling gait 12/19/2018 . Tobacco abuse 04/26/2012 . Weight loss, unintentional 11/09/2018 . 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 NASAL ENDOSCOPY WITH EPISTAXIS CONTROL N/A 04/12/2018  Procedure: NASAL ENDOSCOPY WITH EPISTAXIS CONTROL;  Surgeon: Melissa Montane, MD;  Location: [facility];  Service: ENT;  Laterality: N/A; . VASECTOMY   . VASECTOMY REVERSAL   HPI: Patient is a 74 year old white male with a history of alcoholism, rheumatoid arthritis, alcoholic liver disease, tobacco use, previous CVA, GERD.  Patient was at home doing some work outside when he fell and hit the back of his head on the concrete driveway with positive LOC.  Head CT  positive for subarachnoid hemorrhage and basilar skull fracture.  Subjective: alert, cooperative, says his throat hurts Assessment / Plan / Recommendation CHL IP CLINICAL IMPRESSIONS 11/10/2019 Clinical Impression Pt has a moderate oropharyngeal dysphagia secondary to impaired timing, coordination, and strength. His oral preparation is disorganized, not collecting liquids into well formed boluses. There is sublingual and posterior spilling with mild oral residue. He also has reduced hyolaryngeal movement, epiglottic inversion, base of tongue retraction, and laryngeal vestibule closure, so when nectar thick liquids reach beyond his epiglottis before the swallow, he silently aspirates. He also aspirates after the swallow on moderate amounts of residue left from thickened liquids and soft solids. A chin tuck does not significantly change function, but he has improved bolus cohesion, timing, and pharyngeal efficiency when cued to "swallow hard" with purees and nectar thick liquids via spoon. Recommend downgrading diet to purees and continuing nectar thick liquids by spoon only. Full supervision should be provided to provide cues to swallow hard throughout meals to further increase safety. Daughter, [name], was also present and educated on results.  SLP Visit Diagnosis Dysphagia, oropharyngeal phase (R13.12) Attention and concentration deficit following -- Frontal lobe and executive function deficit following -- Impact on safety and function Moderate aspiration risk   CHL IP TREATMENT RECOMMENDATION 11/10/2019 Treatment Recommendations Therapy as outlined in treatment plan below   Prognosis 11/10/2019 Prognosis for Safe Diet Advancement Good Barriers to Reach Goals Cognitive deficits Barriers/Prognosis Comment -- CHL IP DIET RECOMMENDATION 11/10/2019 SLP Diet Recommendations Dysphagia 1 (Puree) solids;Nectar thick liquid Liquid Administration via Spoon Medication Administration Crushed with puree Compensations Slow  rate;Small sips/bites;Minimize environmental distractions;Effortful swallow Postural Changes Seated upright at 90 degrees   CHL IP OTHER RECOMMENDATIONS 11/10/2019 Recommended Consults -- Oral Care Recommendations Oral care QID Other Recommendations Order thickener from pharmacy;Prohibited food (jello, ice cream, thin soups);Remove water pitcher   CHL IP FOLLOW UP RECOMMENDATIONS 11/10/2019 Follow up Recommendations Inpatient Rehab   CHL IP FREQUENCY AND DURATION 11/10/2019 Speech Therapy Frequency (ACUTE ONLY) min 2x/week Treatment Duration 2 weeks      CHL IP ORAL PHASE 11/10/2019 Oral Phase Impaired Oral - Pudding Teaspoon -- Oral - Pudding Cup -- Oral - Honey Teaspoon -- Oral - Honey Cup Reduced posterior propulsion;Delayed oral transit;Decreased bolus cohesion;Premature spillage;Lingual/palatal residue Oral - Nectar Teaspoon Reduced posterior propulsion;Delayed oral  transit;Lingual/palatal residue Oral - Nectar Cup Reduced posterior propulsion;Delayed oral transit;Decreased bolus cohesion;Premature spillage;Lingual/palatal residue Oral - Nectar Straw -- Oral - Thin Teaspoon -- Oral - Thin Cup -- Oral - Thin Straw -- Oral - Puree Reduced posterior propulsion;Delayed oral transit;Decreased bolus cohesion;Lingual/palatal residue Oral - Mech Soft Reduced posterior propulsion;Delayed oral transit;Decreased bolus cohesion;Lingual/palatal residue;Impaired mastication Oral - Regular -- Oral - Multi-Consistency -- Oral - Pill -- Oral Phase - Comment --  CHL IP PHARYNGEAL PHASE 11/10/2019 Pharyngeal Phase Impaired Pharyngeal- Pudding Teaspoon -- Pharyngeal -- Pharyngeal- Pudding Cup -- Pharyngeal -- Pharyngeal- Honey Teaspoon -- Pharyngeal -- Pharyngeal- Honey Cup Reduced epiglottic inversion;Reduced anterior laryngeal mobility;Reduced laryngeal elevation;Reduced airway/laryngeal closure;Reduced tongue base retraction;Pharyngeal residue - valleculae;Pharyngeal residue - pyriform;Delayed swallow  initiation-vallecula;Penetration/Apiration after swallow Pharyngeal Material enters airway, passes BELOW cords without attempt by patient to eject out (silent aspiration) Pharyngeal- Nectar Teaspoon Reduced epiglottic inversion;Reduced anterior laryngeal mobility;Reduced laryngeal elevation;Reduced airway/laryngeal closure;Reduced tongue base retraction;Compensatory strategies attempted (with notebox);Pharyngeal residue - valleculae Pharyngeal -- Pharyngeal- Nectar Cup Reduced epiglottic inversion;Reduced anterior laryngeal mobility;Reduced laryngeal elevation;Reduced airway/laryngeal closure;Reduced tongue base retraction;Pharyngeal residue - valleculae;Pharyngeal residue - pyriform;Delayed swallow initiation-vallecula;Delayed swallow initiation-pyriform sinuses;Penetration/Aspiration during swallow;Penetration/Apiration after swallow Pharyngeal Material enters airway, passes BELOW cords without attempt by patient to eject out (silent aspiration) Pharyngeal- Nectar Straw -- Pharyngeal -- Pharyngeal- Thin Teaspoon -- Pharyngeal -- Pharyngeal- Thin Cup -- Pharyngeal -- Pharyngeal- Thin Straw -- Pharyngeal -- Pharyngeal- Puree Reduced epiglottic inversion;Reduced anterior laryngeal mobility;Reduced laryngeal elevation;Reduced airway/laryngeal closure;Reduced tongue base retraction;Pharyngeal residue - valleculae;Pharyngeal residue - pyriform;Delayed swallow initiation-vallecula;Compensatory strategies attempted (with notebox) Pharyngeal -- Pharyngeal- Mechanical Soft Reduced epiglottic inversion;Reduced anterior laryngeal mobility;Reduced laryngeal elevation;Reduced airway/laryngeal closure;Reduced tongue base retraction;Pharyngeal residue - valleculae;Pharyngeal residue - pyriform;Penetration/Apiration after swallow;Penetration/Aspiration during swallow Pharyngeal Material enters airway, passes BELOW cords without attempt by patient to eject out (silent aspiration) Pharyngeal- Regular -- Pharyngeal -- Pharyngeal-  Multi-consistency -- Pharyngeal -- Pharyngeal- Pill -- Pharyngeal -- Pharyngeal Comment --  CHL IP CERVICAL ESOPHAGEAL PHASE 11/10/2019 Cervical Esophageal Phase Impaired Pudding Teaspoon -- Pudding Cup -- Honey Teaspoon -- Honey Cup Reduced cricopharyngeal relaxation Nectar Teaspoon Reduced cricopharyngeal relaxation Nectar Cup Reduced cricopharyngeal relaxation Nectar Straw -- Thin Teaspoon -- Thin Cup -- Thin Straw -- Puree Reduced cricopharyngeal relaxation Mechanical Soft Reduced cricopharyngeal relaxation Regular -- Multi-consistency -- Pill -- Cervical Esophageal Comment -- Osie Bond., M.A. CCC-SLP Acute Rehabilitation Services Pager [phone] Office [phone] 11/10/2019, 2:49 PM               Labs:  Basic Metabolic Panel: BMP Latest Ref Rng & Units 11/20/2019 11/16/2019 11/13/2019  Glucose 70 - 99 mg/dL 108(H) 101(H) 111(H)  BUN 8 - 23 mg/dL 23 13 16   Creatinine 0.61 - 1.24 mg/dL 1.22 0.91 0.86  BUN/Creat Ratio 6 - 22 (calc) - - -  Sodium 135 - 145 mmol/L 138 133(L) 134(L)  Potassium 3.5 - 5.1 mmol/L 4.4 3.7 4.3  Chloride 98 - 111 mmol/L 107 102 100  CO2 22 - 32 mmol/L 21(L) 23 24  Calcium 8.9 - 10.3 mg/dL 9.5 9.0 9.4    CBC: CBC Latest Ref Rng & Units 11/13/2019 11/10/2019 11/07/2019  WBC 4.0 - 10.5 K/uL 8.4 9.6 9.7  Hemoglobin 13.0 - 17.0 g/dL 14.4 13.7 14.6  Hematocrit 39 - 52 % 42.7 40.8 43.5  Platelets 150 - 400 K/uL 270 169 209    CBG: No results for input(s): GLUCAP in the last 168 hours.  Brief HPI:   William Gibson is a 74 y.o. male with history  of RA, HTN, fatty liver due to EtOH abuse, gait disorder, report of increased falls x1 week was admitted on 11/03/2019 after a fall in his driveway.  Neighbor witnessed the fall and reported patient struck his head and had 10 minutes loss of consciousness followed by headaches.  Patient was intoxicated at admission with ETOH level 190 3 Bilateral mild to moderate SAH along bilateral tentorium and along right temporal and left  frontal lobe as well as acute nondisplaced fracture of occipital region extending inferiorly to foramen magnum.  Dr. Vertell Limber was consulted for input and recommended conservative care.  Follow-up CT was stable.  Hospital course was significant for issues with DTs requiring multiple medications and was eventually placed on clonidine taper.  Lethargy improving however patient continued to have balance deficits with shuffling gait, moderate dysarthria with dysphagia as well as cognitive deficits.  CIR was recommended due to functional decline.   Hospital Course: William Gibson was admitted to rehab 11/10/2019 for inpatient therapies to consist of PT, ST and OT at least three hours five days a week. Past admission physiatrist, therapy team and rehab RN have worked together to provide customized collaborative inpatient rehab.  He continued to have issues with headaches therefore Topamax added.  Follow-up CT head done on 07/19 due to concerns reports of increased HA and dizziness. This showed acute bilateral inferior frontal infarction involving right cerebellar hypodensities.  Neurology was consulted for input and felt that the changes were consistent with resolving TBI rather than new strokes.  Mobic was discontinued and low-dose prednisone also added to help with pain.  This was tapered to 0.5 mg at discharge.  Wife expressed concerns about patient's anxiety levels therefore Lexapro added for mood stabilization at discharge.  He completed clonidine taper without evidence of hypotension and no further withdrawal symptoms noted during his stay.  Blood pressures to be monitored on 3 times daily basis and have been controlled without medications.  Follow up check of lytes showed that hyponatremia has resolved with improvement in stress induced hyperglycemia. His po intake was poor and has improved with advance to dysphagia 3, thins. Megace was added to help with appetite and wife advised to discontinue this as appetite  improves. Follow up CBC showed that H/H and platelets are stable.  He continues to require supervision to min assist due to balance deficits as well as poor safety awareness and easy distractibility.  He will continue to receive further follow-up outpatient PT, OT and ST at [facility] after discharge   Rehab course: During patient's stay in rehab weekly team conferences were held to monitor patient's progress, set goals and discuss barriers to discharge. At admission, patient required min to mod assist with basic self-care task and mod assist with mobility. He exhibited cognitive deficits consistent with [place].  He was found to have deficits in attention, problem-solving, recall with low vocal intensity as well as signs of dysphagia with delay in swallow initiation. He  has had improvement in activity tolerance, balance, postural control as well as ability to compensate for deficits. He requires supervision with cueing for safety with transfers and to ambulate greater than 150 feet with rolling walker. He requires supervision to min assist for cognitive tasks.  He continues to demonstrate prolonged patient with current textures however he is able to clear oral cavity and swallowing timely manner without signs of aspiration.  Wife was contacted for in-person family education but declined this and preferred to have written instructions/handouts which were  provided by OT, PT and ST.   Disposition:  Home  Diet: Regular. Straws with liquids. Limit distractions.   Special Instructions: 1.  Absolutely no alcohol or aspirin products. 2.  No driving or strenuous activity till cleared by MD.   Discharge Instructions    Ambulatory referral to Occupational Therapy   Complete by: As directed    Eval and treat   Ambulatory referral to Physical Medicine Rehab   Complete by: As directed    1-2 weeks TC appt   Ambulatory referral to Physical Therapy   Complete by: As directed    Eval and treat    Ambulatory referral to Speech Therapy   Complete by: As directed    Eval and treat     Allergies as of 11/23/2019   No Known Allergies     Medication List    STOP taking these medications   cloNIDine 0.1 MG tablet Commonly known as: CATAPRES   hydrOXYzine 25 MG tablet Commonly known as: ATARAX/VISTARIL   meloxicam 15 MG tablet Commonly known as: MOBIC   omeprazole 20 MG tablet Commonly known as: PRILOSEC OTC     TAKE these medications   acetaminophen 325 MG tablet Commonly known as: TYLENOL Take 1-2 tablets (325-650 mg total) by mouth every 4 (four) hours as needed for mild pain.   antiseptic oral rinse Liqd 15 mLs by Mouth Rinse route as needed for dry mouth.   escitalopram 10 MG tablet Commonly known as: Lexapro Take 1 tablet (10 mg total) by mouth daily.   folic acid 1 MG tablet Commonly known as: FOLVITE Take 1 tablet (1 mg total) by mouth daily.   megestrol 400 MG/10ML suspension Commonly known as: MEGACE Take 10 mLs (400 mg total) by mouth 2 (two) times daily. Before lunch and supper   multivitamin with minerals Tabs tablet Take 1 tablet by mouth daily.   nicotine 21 mg/24hr patch Commonly known as: NICODERM CQ - dosed in mg/24 hours Place 1 patch (21 mg total) onto the skin daily. Start taking on: November 24, 2019   pantoprazole 40 MG tablet Commonly known as: PROTONIX Take 1 tablet (40 mg total) by mouth daily. Start taking on: November 24, 2019   predniSONE 1 MG tablet Commonly known as: DELTASONE Take 0.5 tablets (0.5 mg total) by mouth daily with breakfast. Start taking on: November 24, 2019   thiamine 100 MG tablet Commonly known as: Vitamin B-1 Take 1 tablet (100 mg total) by mouth daily.   topiramate 50 MG tablet Commonly known as: TOPAMAX Take 1 tablet (50 mg total) by mouth 2 (two) times daily.   traZODone 50 MG tablet Commonly known as: DESYREL Take 0.5-1 tablets (25-50 mg total) by mouth at bedtime as needed for sleep.   VITAMIN B-12  PO Take 1 tablet by mouth daily.       Follow-up Information    Meredith Staggers, MD Follow up.   Specialty: Physical Medicine and Rehabilitation Why: office will call you with follow up appointment Contact information: [address] [place] [address] [phone]        Erline Levine, MD. Call in 1 day(s).   Specialty: Neurosurgery Why: for post op appointment Contact information: [address] [place] [address] [phone]        Marin Olp, MD. Call in 1 day(s).   Specialty: Family Medicine Why: for post hospital follow up in 1-2 weeks Contact information: [place]  [place] [address] [phone]               Signed: Bary Leriche 11/23/2019, 4:36 PM

## 2019-11-23 NOTE — Progress Notes (Signed)
Patient to be discharged today; meds given for am ; dr Ranell Patrick in room and answered questions from wife.

## 2019-11-24 ENCOUNTER — Telehealth: Payer: Self-pay | Admitting: General Practice

## 2019-11-24 NOTE — Telephone Encounter (Deleted)
Patient's wife called in to schedule husband a hospital follow up, after being admitted for 3 weeks. Has a TOC on 9/17 with Dr.Hunter, but the doctor at the hospital wants him to be seen sooner than that. Next available for a hospital f/u is not until December, can we work patient in?

## 2019-11-27 ENCOUNTER — Telehealth: Payer: Self-pay

## 2019-11-27 NOTE — Telephone Encounter (Signed)
The appt concerns was about the specialist appts. I did tell patient wife that it was beyond our control. She was concerned about PCP being so far out and wanted to know if you think she needs to find another PCP for him that can see him sooner? The appt with Zella Ball is fine and can stay as is. Also can she give him something for constipation?

## 2019-11-27 NOTE — Telephone Encounter (Signed)
It would be fine for her to keep that PCP appointment and we can provide any refills he may need in the interim. For constipation he can take colace three times per day and senna at night. If this regimen is not working for him and he needs something stronger, she can try prune juice, milk of magnesia, suppository, enema, or call me at [phone] to discuss.

## 2019-11-27 NOTE — Progress Notes (Signed)
Inpatient Rehabilitation Care Coordinator  Discharge Note  The overall goal for the admission was met for:   Discharge location: Yes. D/c to home.  Length of Stay: Yes. 12 days.   Discharge activity level: Yes. Supervision.  Home/community participation: Yes. Limited.   Services provided included: MD, RD, PT, OT, SLP, RN, CM, TR, Pharmacy, Neuropsych and SW  Financial Services: Private Insurance: [facility]  Follow-up services arranged: Outpatient: [facility] for PT/OT/SLP and DME: [facility]  Comments (or additional information): contact pt wife [name] [phone]  Patient/Family verbalized understanding of follow-up arrangements: Yes  Individual responsible for coordination of the follow-up plan: Pt to have assistance coordinating care needs.   Confirmed correct DME delivered: Rana Snare 11/27/2019    Rana Snare

## 2019-11-27 NOTE — Telephone Encounter (Signed)
Thanks for your message Lysle Morales. I would be fine if you scheduled his follow-up with me a littler earlier if she would prefer. Lattie Haw, I would be ok with opening up 1:40 on Tuesday August 17th if she would prefer earlier appointment with me after seeing Zella Ball. Unfortunately we can't control the time of her appointments with other specialists.

## 2019-11-27 NOTE — Telephone Encounter (Signed)
Patient wife had some concerns, question 1 and 5    Transitional Care call--Wife Teresa    1. Are you/is patient experiencing any problems since coming home? Wife feels that appts are too far out, [facility] 8/16, Neurosurgeon 8/18 and PCP 9/17-thought Dr. Ranell Patrick said need to see PCP soon, also bleeding in mouth with brushing teeth in the morning, advised to seek a dentist, mood changes, advised pt may just be getting used to the changes in his life Are there any questions regarding any aspect of care? No 2. Are there any questions regarding medications administration/dosing? No Are meds being taken as prescribed? Yes Patient should review meds with caller to confirm 3. Have there been any falls? No 4. Has Home Health been to the house and/or have they contacted you? Outpt rehab If not, have you tried to contact them? Can we help you contact them? 5. Are bowels and bladder emptying properly? Constipated-can wife give him something Are there any unexpected incontinence issues? NoIf applicable, is patient following bowel/bladder programs? 6. Any fevers, problems with breathing, unexpected pain? No 7. Are there any skin problems or new areas of breakdown? No 8. Has the patient/family member arranged specialty MD follow up (ie cardiology/neurology/renal/surgical/etc)? Yes  Can we help arrange? 9. Does the patient need any other services or support that we can help arrange? No 10. Are caregivers following through as expected in assisting the patient? Yes 11. Has the patient quit smoking, drinking alcohol, or using drugs as recommended? Started back smoking  Appointment time 10:00 am, arrive time 9:40 am with Zella Ball then Dr. Ranell Patrick [address] [phone]

## 2019-11-28 NOTE — Telephone Encounter (Signed)
Error

## 2019-12-01 NOTE — Telephone Encounter (Signed)
Tried calling wife at the only number that is in [place]. No answer and mailbox full.

## 2019-12-06 ENCOUNTER — Encounter: Payer: 59 | Attending: Registered Nurse | Admitting: Registered Nurse

## 2019-12-06 ENCOUNTER — Encounter: Payer: Self-pay | Admitting: Registered Nurse

## 2019-12-06 ENCOUNTER — Other Ambulatory Visit: Payer: Self-pay

## 2019-12-06 VITALS — BP 136/79 | HR 86 | Temp 98.0°F | Ht 69.0 in | Wt 148.0 lb

## 2019-12-06 DIAGNOSIS — Z6821 Body mass index (BMI) 21.0-21.9, adult: Secondary | ICD-10-CM | POA: Diagnosis not present

## 2019-12-06 DIAGNOSIS — S069X9A Unspecified intracranial injury with loss of consciousness of unspecified duration, initial encounter: Secondary | ICD-10-CM | POA: Diagnosis not present

## 2019-12-06 DIAGNOSIS — G441 Vascular headache, not elsewhere classified: Secondary | ICD-10-CM | POA: Insufficient documentation

## 2019-12-06 DIAGNOSIS — R634 Abnormal weight loss: Secondary | ICD-10-CM | POA: Insufficient documentation

## 2019-12-06 DIAGNOSIS — R443 Hallucinations, unspecified: Secondary | ICD-10-CM | POA: Diagnosis not present

## 2019-12-06 DIAGNOSIS — R531 Weakness: Secondary | ICD-10-CM | POA: Diagnosis not present

## 2019-12-06 DIAGNOSIS — W19XXXA Unspecified fall, initial encounter: Secondary | ICD-10-CM | POA: Insufficient documentation

## 2019-12-06 DIAGNOSIS — M069 Rheumatoid arthritis, unspecified: Secondary | ICD-10-CM | POA: Diagnosis not present

## 2019-12-06 DIAGNOSIS — R251 Tremor, unspecified: Secondary | ICD-10-CM | POA: Insufficient documentation

## 2019-12-06 DIAGNOSIS — R296 Repeated falls: Secondary | ICD-10-CM | POA: Diagnosis not present

## 2019-12-06 DIAGNOSIS — F329 Major depressive disorder, single episode, unspecified: Secondary | ICD-10-CM | POA: Diagnosis not present

## 2019-12-06 DIAGNOSIS — G911 Obstructive hydrocephalus: Secondary | ICD-10-CM | POA: Diagnosis present

## 2019-12-06 DIAGNOSIS — F1020 Alcohol dependence, uncomplicated: Secondary | ICD-10-CM

## 2019-12-06 DIAGNOSIS — S069X0A Unspecified intracranial injury without loss of consciousness, initial encounter: Secondary | ICD-10-CM | POA: Insufficient documentation

## 2019-12-06 DIAGNOSIS — F17210 Nicotine dependence, cigarettes, uncomplicated: Secondary | ICD-10-CM | POA: Insufficient documentation

## 2019-12-06 DIAGNOSIS — R202 Paresthesia of skin: Secondary | ICD-10-CM | POA: Diagnosis not present

## 2019-12-06 DIAGNOSIS — H9190 Unspecified hearing loss, unspecified ear: Secondary | ICD-10-CM | POA: Diagnosis not present

## 2019-12-06 MED ORDER — TRAZODONE HCL 50 MG PO TABS: 25.0000 mg | ORAL_TABLET | Freq: Every evening | ORAL | 0 refills | Status: DC | PRN

## 2019-12-06 NOTE — Progress Notes (Signed)
Subjective:    Patient ID: William Gibson, male    DOB: 06-06-1945, 74 y.o.   MRN: [id number]  HPI: William Gibson is a 74 y.o. male who is here for Transitional Care visit for follow up of his Traumatic Brain Injury, Vascular Headache and Alcoholism. William Gibson was brought to [facility] on 11/03/2019 via EMS after a fall in his driveway, the fall was witnessed by a neighbor. He fell backwards and had prolonged loss of consciousness ~ 10 minutes, his wife reports frequent falls and unsteady gait. William Gibson admitted to daily consumption of ETOH, ETOH level on 11/03/2019 was 190.  CT Head WO Contrast: CT Cervical Spine WO Contrast:  IMPRESSION: 1. Acute nondisplaced fracture involving the occipital region of the skull, to the right of midline. This extends inferiorly along the skull base to the level of the foramen magnum. 2. No acute fracture involving the cervical spine.  ADDENDUM: Upon further evaluation, an acute nondisplaced fracture is seen involving the occipital region of the skull, to the right of midline. This extends inferiorly along the skull base to the level of the foramen magnum. Neurosurgery was consulted.  He was admitted to inpatient rehabilitation on o7/16/2021 and discharged home on 11/23/2019.he has scheduled appointment with [facility] Neuro [facility] rehabilitation. He denies any pain. He rated his pain 0.  William Gibson denies be depressed admits to feeling anxious due to being "stuck in the house". Mrs. Wease states she has noticed William Gibson becomes anxious when there are people around and William Gibson denies her statement. All questions were answered and emotional support given. Discussed with William Gibson, he will remained on 24 hour supervision until his appointment with Dr Ranell Patrick, they verbalize understanding.   William Gibson reports he hasn't had an alcohol consumption since discharge from hospital.   Pain Inventory Average Pain No pain Pain Right Now  No pain My pain is No pain  In the last 24 hours, has pain interfered with the following? General activity 0 Relation with others 0 Enjoyment of life 0 What TIME of day is your pain at its worst? No pain Sleep (in general) Good  Pain is worse with: No pain Pain improves with: No pain Relief from Meds: No pain medicine  Mobility use a walker how many minutes can you walk? unknown ability to climb steps?  no do you drive?  no  Function retired Do you have any goals in this area?  yes  Neuro/Psych dizziness depression loss of taste or smell  Prior Studies New Patient  Physicians involved in your care Any changes since last visit?  no New patient   Family History  Problem Relation Age of Onset  . Lung cancer Father   . Heart disease Mother   . Colon cancer Neg Hx   . Esophageal cancer Neg Hx   . Stomach cancer Neg Hx    Social History   Socioeconomic History  . Marital status: Married    Spouse name: Not on file  . Number of children: Not on file  . Years of education: Not on file  . Highest education level: Not on file  Occupational History  . Not on file  Tobacco Use  . Smoking status: Current Every Day Smoker    Packs/day: 1.50    Years: 58.00    Pack years: 87.00    Types: Cigarettes  . Smokeless tobacco: Never Used  Vaping Use  . Vaping Use: Never used  Substance  and Sexual Activity  . Alcohol use: Not Currently    Comment: 1/5 whiskey a day  . Drug use: No  . Sexual activity: Not Currently  Other Topics Concern  . Not on file  Social History Narrative   Does not have a living will.   Desires CPR, does not want prolonged life support if futile.            Social Determinants of Health   Financial Resource Strain:   . Difficulty of Paying Living Expenses:   Food Insecurity:   . Worried About Charity fundraiser in the Last Year:   . Arboriculturist in the Last Year:   Transportation Needs:   . Film/video editor (Medical):   Marland Kitchen  Lack of Transportation (Non-Medical):   Physical Activity:   . Days of Exercise per Week:   . Minutes of Exercise per Session:   Stress:   . Feeling of Stress :   Social Connections:   . Frequency of Communication with Friends and Family:   . Frequency of Social Gatherings with Friends and Family:   . Attends Religious Services:   . Active Member of Clubs or Organizations:   . Attends Archivist Meetings:   Marland Kitchen Marital Status:    Past Surgical History:  Procedure Laterality Date  . NASAL ENDOSCOPY WITH EPISTAXIS CONTROL N/A 04/12/2018   Procedure: NASAL ENDOSCOPY WITH EPISTAXIS CONTROL;  Surgeon: Melissa Montane, MD;  Location: [facility];  Service: ENT;  Laterality: N/A;  . VASECTOMY    . VASECTOMY REVERSAL     Past Medical History:  Diagnosis Date  . Abnormal liver function tests 12/15/2018  . Alcoholic liver disease ([place]) 12/15/2018  . Alcoholism ([place])   . Elevated LDL cholesterol level 05/22/2013  . Epistaxis   . Erectile dysfunction 05/22/2013  . Frequent falls 11/09/2018  . GERD (gastroesophageal reflux disease)   . Hyperammonemia ([place]) 11/15/2018  . Memory change 08/17/2017  . RA (rheumatoid arthritis) ([place]) 08/17/2017  . RBBB 12/15/2018  . Rheumatoid arthritis ([place]) 2010  . Shuffling gait 12/19/2018  . Tobacco abuse 04/26/2012  . Weight loss, unintentional 11/09/2018  . 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 12/15/2018   BP 136/79   Pulse 86   Temp 98 F (36.7 C)   Ht 5\' 9"  (1.753 m)   Wt 148 lb (67.1 kg)   SpO2 96%   BMI 21.86 kg/m   Opioid Risk Score:   Fall Risk Score:  `1  Depression screen PHQ 2/9  Depression screen [facility] 2/9 12/06/2019 11/16/2018 06/01/2017 01/30/2016 05/22/2013 05/22/2013  Decreased Interest 0 0 0 0 0 0  Down, Depressed, Hopeless 0 0 0 0 0 0  PHQ - 2 Score 0 0 0 0 0 0  Altered sleeping 3 - - - - -  Tired, decreased energy 1 - - - - -  Change in appetite 0 - - - - -  Feeling bad or failure about yourself  0 - - - - -  Trouble  concentrating 1 - - - - -  Moving slowly or fidgety/restless 1 - - - - -  Suicidal thoughts 0 - - - - -  PHQ-9 Score 6 - - - - -   Review of Systems  Constitutional: Negative.   HENT: Negative.   Eyes: Negative.   Respiratory: Negative.   Cardiovascular: Negative.   Gastrointestinal: Negative.   Endocrine: Negative.   Genitourinary: Negative.   Musculoskeletal: Negative.  Skin: Negative.   Allergic/Immunologic: Negative.   Neurological: Positive for dizziness.       Dizzy, depression, anxiety  Hematological: Negative.   Psychiatric/Behavioral: Negative.        Objective:   Physical Exam Vitals and nursing note reviewed.  Constitutional:      Appearance: Normal appearance.  Cardiovascular:     Rate and Rhythm: Normal rate and regular rhythm.     Pulses: Normal pulses.     Heart sounds: Normal heart sounds.  Pulmonary:     Effort: Pulmonary effort is normal.     Breath sounds: Normal breath sounds.  Musculoskeletal:     Cervical back: Normal range of motion and neck supple.     Comments: Normal Muscle Bulk and Muscle Testing Reveals:  Upper Extremities: Full ROM and Muscle Strength 5/5  Lower Extremities: Full ROM and Muscle Strength 5/5 Arises from Table with ease Narrow Based  Gait   Skin:    General: Skin is warm and dry.  Neurological:     Mental Status: He is alert and oriented to person, place, and time.  Psychiatric:        Mood and Affect: Mood normal.        Behavior: Behavior normal.           Assessment & Plan:  1.Traumatic Brain Injury: Continue with Outpatient Therapy with Neuro Rehabilitation. He has a scheduled appointment with Neurosurgery.  2. Vascular Headache: Continue current medication regimen. Continue to monitor.  3. Alcoholism.Mr. Gayler reports he hasn't consume and ETOH since his discharge. Will continue to monitor.   20 minutes of face to face patient care time was spent during this visit. All questions were encouraged and  answered.  F/U with Dr Ranell Patrick in 4- 6 weeks

## 2019-12-10 ENCOUNTER — Encounter: Payer: Self-pay | Admitting: Registered Nurse

## 2019-12-11 ENCOUNTER — Other Ambulatory Visit: Payer: Self-pay

## 2019-12-11 ENCOUNTER — Ambulatory Visit: Payer: 59 | Attending: Physical Medicine and Rehabilitation

## 2019-12-11 ENCOUNTER — Ambulatory Visit: Payer: 59

## 2019-12-11 VITALS — BP 112/68

## 2019-12-11 DIAGNOSIS — M6281 Muscle weakness (generalized): Secondary | ICD-10-CM | POA: Diagnosis present

## 2019-12-11 DIAGNOSIS — R2689 Other abnormalities of gait and mobility: Secondary | ICD-10-CM | POA: Insufficient documentation

## 2019-12-11 DIAGNOSIS — R41841 Cognitive communication deficit: Secondary | ICD-10-CM | POA: Diagnosis present

## 2019-12-11 DIAGNOSIS — R1312 Dysphagia, oropharyngeal phase: Secondary | ICD-10-CM

## 2019-12-11 DIAGNOSIS — R2681 Unsteadiness on feet: Secondary | ICD-10-CM | POA: Insufficient documentation

## 2019-12-11 NOTE — Therapy (Signed)
[facility] [address] [place], [place], [address] Phone: [phone]   Fax:  [phone]  Physical Therapy Evaluation  Patient Details  Name: William Gibson MRN: [id number] Date of Birth: 01/20/1946 Referring Provider (PT): Reesa Chew   Encounter Date: 12/11/2019   PT End of Session - 12/11/19 1453    Visit Number 1    Number of Visits 17    Date for PT Re-Evaluation 38/25/05   90 day cert but 39 day poc   Authorization Type [employer] and med A secondary so 10th visit progress note    PT Start Time 1450    PT Stop Time 1530    PT Time Calculation (min) 40 min    Activity Tolerance Patient tolerated treatment well    Behavior During Therapy [facility] for tasks assessed/performed           Past Medical History:  Diagnosis Date  . Abnormal liver function tests 12/15/2018  . Alcoholic liver disease ([place]) 12/15/2018  . Alcoholism ([place])   . Elevated LDL cholesterol level 05/22/2013  . Epistaxis   . Erectile dysfunction 05/22/2013  . Frequent falls 11/09/2018  . GERD (gastroesophageal reflux disease)   . Hyperammonemia ([place]) 11/15/2018  . Memory change 08/17/2017  . RA (rheumatoid arthritis) ([place]) 08/17/2017  . RBBB 12/15/2018  . Rheumatoid arthritis ([place]) 2010  . Shuffling gait 12/19/2018  . Tobacco abuse 04/26/2012  . Weight loss, unintentional 11/09/2018  . 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 12/15/2018    Past Surgical History:  Procedure Laterality Date  . NASAL ENDOSCOPY WITH EPISTAXIS CONTROL N/A 04/12/2018   Procedure: NASAL ENDOSCOPY WITH EPISTAXIS CONTROL;  Surgeon: Melissa Montane, MD;  Location: [facility];  Service: ENT;  Laterality: N/A;  . VASECTOMY    . VASECTOMY REVERSAL      Vitals:   12/11/19 1513 12/11/19 1514  BP: 118/72 112/68      Subjective Assessment - 12/11/19 1453    Subjective 74 y.o. male with history of RA, HTN, fatty liver due to EtOH abuse, gait disorder, report of increased falls x1  week was admitted on 11/03/2019 after a fall in his driveway.  Neighbor witnessed the fall and reported patient struck his head and had 10 minutes loss of consciousness followed by headaches.  Patient was intoxicated at admission with ETOH level 190 3Bilateral mild to moderate SAH along bilateral tentorium and along right temporal and left frontal lobe as well as acute nondisplaced fracture of occipital region extending inferiorly to foramen magnum. Conservative treatment. Admitted to rehab 11/10/19-11/23/19. He continued to have issues with headaches therefore Topamax added.  Follow-up CT head done on 07/19 due to concerns reports of increased HA and dizziness. This showed acute bilateral inferior frontal infarction involving right cerebellar hypodensities.  Neurology was consulted for input and felt that the changes were consistent with resolving TBI rather than new strokes. Pt reports that he is doing much better. He reports still having minor headaches. Only dizziness is some lightheadedness when first gets up. After sitting a bit goes away. Pt denies any alcohol use since last fall. No falls since coming home. Pt is using walker when goes out but has been going without in house and on porch. Does keep it near by at night when goes to bathroom. Pt reports that his energy level is not as good.    Patient is accompained by: Family member   daughter   Pertinent History RA, HTN, fatty liver due  to EtOH abuse, gait disorder, report of increased falls x1 week    Patient Stated Goals Pt wants to gain some weight back to get stronger.    Currently in Pain? No/denies              [facility] PT Assessment - 12/11/19 1456      Assessment   Medical Diagnosis frequent falls and head injury    Referring Provider (PT) Reesa Chew    Onset Date/Surgical Date 11/03/19    Hand Dominance Right    Prior Therapy inpatient rehab      Precautions   Precautions Fall      Balance Screen   Has the patient fallen in the  past 6 months Yes    How many times? 8   pt denies always drinking when falling and sometimes balance   Has the patient had a decrease in activity level because of a fear of falling?  No    Is the patient reluctant to leave their home because of a fear of falling?  No      Home Environment   Living Environment Private residence    Living Arrangements Spouse/significant other    Available Help at Discharge Family    Type of [place] to enter    Entrance Stairs-Number of Steps 3    Entrance Stairs-Rails Right;Left;Can reach both   but are wide   Home Layout One level    [place] - 2 wheels;Bedside commode      Prior Function   Level of Independence Independent   used walking stick out in yard   Vocation Retired    Leisure work in yard, garden, work on old cars      Cognition   Overall Cognitive Status --   daughter reports he is a little more quiet   Area of Risk manager;Awareness;Problem solving    Problem Solving --   daughter reports processing seems about the same     Observation/Other Assessments   Observations Smooth pursuit and saccades intact, Peripheral vision intact. Had some end gaze nystagmus. Denied any dizziness.      Sensation   Light Touch Appears Intact      Coordination   Gross Motor Movements are Fluid and Coordinated Yes   RAMs intact   Fine Motor Movements are Fluid and Coordinated Yes   finger opposition intact just decreased due to arthritis     ROM / Strength   AROM / PROM / Strength Strength      Strength   Strength Assessment Site Shoulder;Elbow;Hand;Hip;Knee;Ankle    Right/Left Shoulder Right;Left    Right Shoulder Flexion 5/5    Left Shoulder Flexion 5/5    Right/Left Elbow Right;Left    Right Elbow Flexion 5/5    Right Elbow Extension 5/5    Left Elbow Flexion 5/5    Left Elbow Extension 5/5    Right/Left hand Right;Left    Right Hand Gross Grasp Functional    Left Hand Gross Grasp Functional     Right/Left Hip Right;Left    Right Hip Flexion 4+/5    Left Hip Flexion 4+/5    Right/Left Knee Right;Left    Right Knee Flexion 4+/5    Right Knee Extension 5/5    Left Knee Flexion 4+/5    Left Knee Extension 5/5    Right Ankle Dorsiflexion 4+/5    Left Ankle Dorsiflexion 4+/5      Transfers  Transfers Sit to Stand;Stand to Sit    Sit to Stand 5: Supervision    Five time sit to stand comments  13.51 sec without hands from chair    Stand to Sit 5: Supervision      Ambulation/Gait   Ambulation/Gait Yes    Ambulation/Gait Assistance 5: Supervision    Ambulation/Gait Assistance Details Pt ambulated 61' with RW and 83' without AD.     Ambulation Distance (Feet) 80 Feet    Assistive device Rolling walker;None    Gait Pattern Step-through pattern;Decreased arm swing - right;Decreased arm swing - left    Ambulation Surface Level;Indoor    Gait velocity 9.86 sec =1.37m/s with RW and 10.64 sec=0.59m/s without AD      Standardized Balance Assessment   Standardized Balance Assessment Berg Balance Test      Berg Balance Test   Sit to Stand Able to stand without using hands and stabilize independently    Standing Unsupported Able to stand safely 2 minutes    Sitting with Back Unsupported but Feet Supported on Floor or Stool Able to sit safely and securely 2 minutes    Stand to Sit Sits safely with minimal use of hands    Transfers Able to transfer safely, minor use of hands    Standing Unsupported with Eyes Closed Able to stand 10 seconds safely    Standing Unsupported with Feet Together Able to place feet together independently and stand for 1 minute with supervision    From Standing, Reach Forward with Outstretched Arm Can reach confidently >25 cm (10")    From Standing Position, Pick up Object from Floor Able to pick up shoe safely and easily    From Standing Position, Turn to Look Behind Over each Shoulder Needs supervision when turning    Turn 360 Degrees Able to turn 360 degrees  safely but slowly    Standing Unsupported, Alternately Place Feet on Step/Stool Able to complete 4 steps without aid or supervision    Standing Unsupported, One Foot in Front Able to take small step independently and hold 30 seconds    Standing on One Leg Tries to lift leg/unable to hold 3 seconds but remains standing independently    Total Score 43                      Objective measurements completed on examination: See above findings.               PT Education - 12/11/19 1712    Education Details Pt to continue with current HEP from inpatient rehab. Discussed PT plan of care and results of testing indicating still fall risk.    Person(s) Educated Patient;Child(ren)    Methods Explanation    Comprehension Verbalized understanding            PT Short Term Goals - 12/11/19 1724      PT SHORT TERM GOAL #1   Title Pt will be able to perform initial HEP with supervision of family for strengthening and balance.    Time 4    Period Weeks    Status New    Target Date 01/10/20      PT SHORT TERM GOAL #2   Title Pt will increase Berg from 43/56 to >47/56 for improved balance and decreased fall risk.    Baseline 43/56 on 12/11/19    Time 4    Period Weeks    Status New    Target Date 01/10/20  PT SHORT TERM GOAL #3   Title Pt will ambulate >500' on level surfaces without AD independently for improved household mobility and short community distances.    Time 4    Period Weeks    Status New    Target Date 01/10/20      PT SHORT TERM GOAL #4   Title Pt will decreased 5 x sit to stand from 13.51 sec to <11 sec for improved functional strength and balance.    Baseline 13.51 sec from chair without hands on 12/11/19    Time 4    Period Weeks    Status New    Target Date 01/10/20      PT SHORT TERM GOAL #5   Title FGA will be assessed and LTG written.    Time 4    Period Weeks    Status New    Target Date 01/10/20             PT Long Term  Goals - 12/11/19 1729      PT LONG TERM GOAL #1   Title Pt will be independent with progressive HEP for balance and strengthening to continue gains on own.    Time 8    Period Weeks    Status New    Target Date 02/09/20      PT LONG TERM GOAL #2   Title Pt will ambulate >1000' on varied surfaces independently for improved community mobility.    Time 8    Period Weeks    Status New    Target Date 02/09/20      PT LONG TERM GOAL #3   Title Pt will increase gait speed from 0.49m/s to >1.10m/s for improved gait safety.    Baseline 0.44m/s on 12/11/19 without AD    Time 8    Period Weeks    Status New    Target Date 02/09/20      PT LONG TERM GOAL #4   Title FGA goal will be written    Time 8    Period Weeks    Status New    Target Date 02/09/20                  Plan - 12/11/19 1714    Clinical Impression Statement Pt is 74 y/o male who presents after fall with Bilateral mild to moderate SAH along bilateral tentorium and along right temporal and left frontal lobe as well as acute nondisplaced fracture of occipital region extending inferiorly to foramen magnum. Pt with multiple falls prior. Does have history of EtOH abuse. Denies drinking since hospitalization. Pt strength testing with MMT grossly 4+/5 in BLE but functionally demonstrates more weakness. Based on 5 x sit to stand score of 13.51 sec and Berg Balance of 43/56 pt is fall risk still. He is ambulating some with RW and some without. Without AD gait speed of 0.57m/s shows he is ambulating at community ambulator speed but decreased from norms. Pt will benefit from skilled PT to address strength, balance and functional mobility deficits.    Personal Factors and Comorbidities Comorbidity 3+    Comorbidities RA, HTN, fatty liver due to EtOH abuse, gait disorder, report of increased fall    Examination-Activity Limitations Stand;Locomotion Level;Transfers    Examination-Participation Restrictions Community Activity;Yard  Work;Medication Management    Stability/Clinical Decision Making Evolving/Moderate complexity    Clinical Decision Making Moderate    Rehab Potential Good    PT Frequency 2x / week    PT  Duration 8 weeks    PT Treatment/Interventions ADLs/Self Care Home Management;DME Instruction;Gait training;Stair training;Functional mobility training;Therapeutic activities;Therapeutic exercise;Balance training;Patient/family education;Neuromuscular re-education;Vestibular;Passive range of motion    PT Next Visit Plan Check pt's current HEP from inpatient rehab that I asked him to bring in and adjust as needed. Assess FGA so LTG can be written. Gait training without walker, balance training.    Consulted and Agree with Plan of Care Patient;Family member/caregiver    Family Member Consulted daughter           Patient will benefit from skilled therapeutic intervention in order to improve the following deficits and impairments:  Abnormal gait, Decreased cognition, Decreased balance, Decreased activity tolerance, Decreased strength, Decreased mobility, Decreased knowledge of use of DME, Decreased endurance  Visit Diagnosis: Other abnormalities of gait and mobility  Muscle weakness (generalized)  Unsteadiness on feet     Problem List Patient Active Problem List   Diagnosis Date Noted  . Vascular headache   . TBI (traumatic brain injury) ([place])   . Alcohol-induced chronic pancreatitis ([place])   . Dysphagia   . Fall   . Hypomagnesemia   . Hyponatremia   . Delirium tremens ([place])   . Subarachnoid hemorrhage ([place]) 11/04/2019  . Skull fracture ([place]) 11/04/2019  . Head injury 11/04/2019  . Fall at home, initial encounter 11/04/2019  . Loss of consciousness ([place]) 11/04/2019  . Hyperlipidemia 02/06/2019  . Shuffling gait 12/19/2018  . Abnormal liver function tests 12/15/2018  . Alcoholic liver disease ([place]) 12/15/2018  . RBBB 12/15/2018  . 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 12/15/2018  .  Hyperammonemia ([place]) 11/15/2018  . Weight loss, unintentional 11/09/2018  . Frequent falls 11/09/2018  . RA (rheumatoid arthritis) ([place]) 08/17/2017  . Memory change 08/17/2017  . Alcoholism ([place]) 12/15/2016  . Osteoarthritis 01/30/2016  . GERD (gastroesophageal reflux disease) 05/23/2014  . Elevated LDL cholesterol level 05/22/2013  . Erectile dysfunction 05/22/2013  . Tobacco abuse 04/26/2012    Electa Sniff, PT, DPT, NCS 12/11/2019, 5:32 PM  [facility] [address] [place], [place], [address] Phone: [phone]   Fax:  [phone]  Name: William Gibson MRN: [id number] Date of Birth: [birth year]

## 2019-12-11 NOTE — Therapy (Addendum)
[facility] [address] [place], [place], [address] Phone: [phone]   Fax:  [phone]  Speech Language Pathology Evaluation  Patient Details  Name: William Gibson MRN: [id number] Date of Birth: Aug 12, 1945 Referring Provider (SLP): Reesa Chew, [place]   Encounter Date: 12/11/2019   End of Session - 12/11/19 1702    Visit Number 1    Number of Visits 17    Date for SLP Re-Evaluation 03/08/20   90 days   SLP Start Time 9326    SLP Stop Time  1622    SLP Time Calculation (min) 45 min    Activity Tolerance Patient tolerated treatment well           Past Medical History:  Diagnosis Date  . Abnormal liver function tests 12/15/2018  . Alcoholic liver disease ([place]) 12/15/2018  . Alcoholism ([place])   . Elevated LDL cholesterol level 05/22/2013  . Epistaxis   . Erectile dysfunction 05/22/2013  . Frequent falls 11/09/2018  . GERD (gastroesophageal reflux disease)   . Hyperammonemia ([place]) 11/15/2018  . Memory change 08/17/2017  . RA (rheumatoid arthritis) ([place]) 08/17/2017  . RBBB 12/15/2018  . Rheumatoid arthritis ([place]) 2010  . Shuffling gait 12/19/2018  . Tobacco abuse 04/26/2012  . Weight loss, unintentional 11/09/2018  . 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 12/15/2018    Past Surgical History:  Procedure Laterality Date  . NASAL ENDOSCOPY WITH EPISTAXIS CONTROL N/A 04/12/2018   Procedure: NASAL ENDOSCOPY WITH EPISTAXIS CONTROL;  Surgeon: Melissa Montane, MD;  Location: [facility];  Service: ENT;  Laterality: N/A;  . VASECTOMY    . VASECTOMY REVERSAL      There were no vitals filed for this visit.   Subjective Assessment - 12/11/19 1607    Patient is accompained by: Family member   [name] - daughter   Currently in Pain? No/denies                   HPI               history of RA, HTN, fatty liver due to EtOH abuse,          gait disorder, report of increased falls x1 week was          admitted on 11/03/2019  after a fall in his driveway.         Neighbor witnessed the fall and reported patient struck         his head and had 10 minutes loss of consciousness         followed by headaches.  Patient was intoxicated at         admission with ETOH level 190.3. Bilateral mild to         moderate SAH along bilateral tentorium and along right         temporal and left frontal lobe as well as acute         nondisplaced fracture of occipital region extending         inferiorly to foramen magnum.Follow-up CT head done         on 07/19 due to concerns reports of increased HA and         dizziness. This showed acute bilateral inferior frontal         infarction involving right cerebellar hypodensities.         Neurology was consulted for input and felt that the  changes were consistent with resolving TBI rather         than new strokes.        SLP Evaluation [facility] - 12/11/19 1607      SLP Visit Information   SLP Received On 12/11/19    Referring Provider (SLP) Reesa Chew, PA-C    Onset Date 11-03-19    Medical Diagnosis TBI      Subjective   Subjective Pt unable to give swallow precautions - daughter assisted pt.       Balance Screen   Has the patient fallen in the past 6 months --   Pt with PT eval prior to this eval.     Prior Functional Status   Cognitive/Linguistic Baseline Within functional limits    Type of Home House     Lives With Spouse    Available Support Family      Cognition   Overall Cognitive Status Impaired/Different from baseline    Area of Impairment Memory;Awareness;Problem solving    Memory Decreased short-term memory    Memory Comments Wife administers meds to patient following his hospitalization whereas pt retrieved his own meds prior to fall/TBI. Pt was looking to dtr for details of both swallow safety precautions as well as details of ST sessions "She had me read some things."     Awareness Intellectual    Awareness Comments Pt did not name any cognitive  deficits post-TBI when asked by SLP. He did not notice obvious errors nor changed any errors on the assessment today. After affirming memory problems post-TBI when filling out the questionnaire section of the SAGE pt answered "no" to "Have you had any changes to your memory or tihnking?" Splinter skills noted with emergent awarenss as pt endorsed he is not ready to drive yet but "I'm haven't tried that yet. I'll know when I'm ready."     Problem Solving Comments Pt did not attempt executive function/problem solving section of the SAGE, saying "I don't konw what they want to do here."     Memory --    Memory Impairment --    Behaviors Verbal agitation   mild     Auditory Comprehension   Overall Auditory Comprehension Appears within functional limits for tasks assessed      Verbal Expression   Overall Verbal Expression Appears within functional limits for tasks assessed      Motor Speech   Overall Motor Speech Appears within functional limits for tasks assessed      Standardized Assessments   Standardized Assessments  Self-Administered Gero-Cognitive Examination    Self-Administered Gero-Cognitive Examination  11/22 (WNL is > 17.8)                           SLP Education - 12/11/19 1702    Education Details pt would benefit from ST for thinking skills    Person(s) Educated Patient;Child(ren)    Methods Explanation;Handout    Comprehension Verbalized understanding;Need further instruction            SLP Short Term Goals - 12/11/19 1711      SLP SHORT TERM GOAL #1   Title pt indicate affirmation of cogntive linguistic deficits in 3 therapy sessions    Time 4    Period Weeks   or 9 total sessions, for all STGs   Status New      SLP SHORT TERM GOAL #2   Title pt demonstrate appropriate problem solving skills for functional situations  with occasional min A in 3 sessions    Time 4    Period Weeks    Status New      SLP SHORT TERM GOAL #3   Title pt will indicate  need for memory copmensation techniques x3 sessions    Time 4    Period Weeks    Status New      SLP SHORT TERM GOAL #4   Title pt will demonstrate improvement in medication administration by correctly indicating to wife when he needs meds for 5 consecutive days as reported by pt and/or wife    Time 4    Period Weeks    Status New            SLP Long Term Goals - 12/11/19 1714      SLP LONG TERM GOAL #1   Title pt will demo evidence of emergent awareness (correcting his errors) on written or verbal tasks in 85% of opportunities in 3 sessions    Time 8    Period Weeks   or 17 total sessions, for all LTGs   Status New      SLP LONG TERM GOAL #2   Title pt will appropriately solve problems with finances, medication, etc with modified independence in 3 sessions    Time 8    Period Weeks    Status New      SLP LONG TERM GOAL #3   Title pt will administer his own medicine for 7 consecutive days with modified independence    Time 8    Period Weeks    Status New      SLP LONG TERM GOAL #4   Title pt will use of a memory compensatoin system successfully between 4 sessions    Time 8    Period Weeks    Status New      SLP LONG TERM GOAL #5   Title pt/family will indicate 3 overt s/sx aspiraiton PNA with modifeid independence    Time 8    Period Weeks    Status New            Plan - 12/11/19 1703    Clinical Impression Statement Pt presents today with cognitive communication deficits in the areas of (as tested) awareness, memory, and problem solving/executive function. Pt scored 11/22 on the Self-Administered Gero-Cognitive Evaluation [employer] 2007-2021) where scores of 17.8 or higher are considered WNL.Pt's deficits are affecting practical activities such as his wife now administers his medicine to him, and pt is unable to work in the yard. Pt was d/c'd with 24 hour supervision recommended. SLP recommends and pt was agreeable today to skiled ST x2/week  x8 weeks, adjusting the schedule as needed depending on pt progress. Pt was d/c;d from CIR with recommendation of straw sips with water but today looked fine with cup sips x3 (multiple, consecutive sips). SLP recommended to pt/daughter that pt can take liquids via cup, or a straw if desired.    Speech Therapy Frequency 2x / week    Duration --   8 weeks or 17 total visits   Treatment/Interventions Diet toleration management by SLP;Trials of upgraded texture/liquids;Cueing hierarchy;Cognitive reorganization;Internal/external aids;Patient/family education;Compensatory strategies;SLP instruction and feedback;Functional tasks;Environmental controls    Potential to Achieve Goals Good    Potential Considerations Cooperation/participation level    Consulted and Agree with Plan of Care Patient           Patient will benefit from skilled therapeutic intervention in order to  improve the following deficits and impairments:   Cognitive communication deficit  Dysphagia, oropharyngeal phase    Problem List Patient Active Problem List   Diagnosis Date Noted  . Vascular headache   . TBI (traumatic brain injury) ([place])   . Alcohol-induced chronic pancreatitis ([place])   . Dysphagia   . Fall   . Hypomagnesemia   . Hyponatremia   . Delirium tremens ([place])   . Subarachnoid hemorrhage ([place]) 11/04/2019  . Skull fracture ([place]) 11/04/2019  . Head injury 11/04/2019  . Fall at home, initial encounter 11/04/2019  . Loss of consciousness ([place]) 11/04/2019  . Hyperlipidemia 02/06/2019  . Shuffling gait 12/19/2018  . Abnormal liver function tests 12/15/2018  . Alcoholic liver disease ([place]) 12/15/2018  . RBBB 12/15/2018  . 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 12/15/2018  . Hyperammonemia ([place]) 11/15/2018  . Weight loss, unintentional 11/09/2018  . Frequent falls 11/09/2018  . RA (rheumatoid arthritis) ([place]) 08/17/2017  . Memory change 08/17/2017  . Alcoholism ([place]) 12/15/2016  . Osteoarthritis  01/30/2016  . GERD (gastroesophageal reflux disease) 05/23/2014  . Elevated LDL cholesterol level 05/22/2013  . Erectile dysfunction 05/22/2013  . Tobacco abuse 04/26/2012    [facility] ,[place], [place]  12/11/2019, 5:30 PM  [facility] [address] [place], [place], [address] Phone: [phone]   Fax:  [phone]  Name: William Gibson MRN: [id number] Date of Birth: 21-Dec-1945

## 2019-12-11 NOTE — Patient Instructions (Signed)
  Please complete the assigned speech therapy homework prior to your next session and return it to the speech therapist at your next visit.  

## 2019-12-13 ENCOUNTER — Other Ambulatory Visit: Payer: Self-pay | Admitting: Physical Medicine and Rehabilitation

## 2019-12-13 DIAGNOSIS — G44329 Chronic post-traumatic headache, not intractable: Secondary | ICD-10-CM | POA: Insufficient documentation

## 2019-12-14 ENCOUNTER — Other Ambulatory Visit: Payer: Self-pay | Admitting: Physical Medicine and Rehabilitation

## 2019-12-14 ENCOUNTER — Other Ambulatory Visit: Payer: Self-pay | Admitting: Neurosurgery

## 2019-12-14 DIAGNOSIS — I6200 Nontraumatic subdural hemorrhage, unspecified: Secondary | ICD-10-CM

## 2019-12-21 ENCOUNTER — Other Ambulatory Visit: Payer: Self-pay

## 2019-12-21 ENCOUNTER — Ambulatory Visit: Payer: 59 | Admitting: Physical Therapy

## 2019-12-21 ENCOUNTER — Encounter: Payer: Self-pay | Admitting: Physical Therapy

## 2019-12-21 DIAGNOSIS — R2689 Other abnormalities of gait and mobility: Secondary | ICD-10-CM | POA: Diagnosis not present

## 2019-12-21 DIAGNOSIS — M6281 Muscle weakness (generalized): Secondary | ICD-10-CM

## 2019-12-21 DIAGNOSIS — R2681 Unsteadiness on feet: Secondary | ICD-10-CM

## 2019-12-21 NOTE — Patient Instructions (Signed)
Access Code: [email or web address] URL: [email or web address] Date: 12/21/2019 Prepared by: Willow Ora  Exercises Sit to Stand - 1 x daily - 5 x weekly - 1 sets - 10 reps Side Stepping with Counter Support - 1 x daily - 5 x weekly - 1 sets - 3 reps Walking March - 1 x daily - 5 x weekly - 1 sets - 3 reps Tandem Walking with Counter Support - 1 x daily - 5 x weekly - 1 sets - 3 reps Standing Balance with Eyes Closed on Foam - 1 x daily - 5 x weekly - 1 sets - 3 reps - 30 hold Wide Stance with Head Nods on Foam Pad - 1 x daily - 5 x weekly - 1 sets - 10 reps Wide Stance with Head Rotation on Foam Pad - 1 x daily - 5 x weekly - 1 sets - 10 reps

## 2019-12-22 ENCOUNTER — Ambulatory Visit
Admission: RE | Admit: 2019-12-22 | Discharge: 2019-12-22 | Disposition: A | Discharge status: Home / Self Care | Payer: 59 | Source: Ambulatory Visit | Attending: Neurosurgery | Admitting: Neurosurgery

## 2019-12-22 DIAGNOSIS — I6200 Nontraumatic subdural hemorrhage, unspecified: Secondary | ICD-10-CM

## 2019-12-22 NOTE — Therapy (Signed)
[facility] [address] [place], [place], [address] Phone: [phone]   Fax:  [phone]  Physical Therapy Treatment  Patient Details  Name: William Gibson MRN: [id number] Date of Birth: November 08, 1945 Referring Provider (PT): Reesa Chew   Encounter Date: 12/21/2019   PT End of Session - 12/21/19 1454    Visit Number 2    Number of Visits 17    Date for PT Re-Evaluation 79/48/01   90 day cert but 47 day poc   Authorization Type [employer] and med A secondary so 10th visit progress note    PT Start Time 1448    PT Stop Time 1527    PT Time Calculation (min) 39 min    Equipment Utilized During Treatment Gait belt    Activity Tolerance Patient tolerated treatment well    Behavior During Therapy [facility] for tasks assessed/performed           Past Medical History:  Diagnosis Date  . Abnormal liver function tests 12/15/2018  . Alcoholic liver disease ([place]) 12/15/2018  . Alcoholism ([place])   . Elevated LDL cholesterol level 05/22/2013  . Epistaxis   . Erectile dysfunction 05/22/2013  . Frequent falls 11/09/2018  . GERD (gastroesophageal reflux disease)   . Hyperammonemia ([place]) 11/15/2018  . Memory change 08/17/2017  . RA (rheumatoid arthritis) ([place]) 08/17/2017  . RBBB 12/15/2018  . Rheumatoid arthritis ([place]) 2010  . Shuffling gait 12/19/2018  . Tobacco abuse 04/26/2012  . Weight loss, unintentional 11/09/2018  . 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 12/15/2018    Past Surgical History:  Procedure Laterality Date  . NASAL ENDOSCOPY WITH EPISTAXIS CONTROL N/A 04/12/2018   Procedure: NASAL ENDOSCOPY WITH EPISTAXIS CONTROL;  Surgeon: Melissa Montane, MD;  Location: [facility];  Service: ENT;  Laterality: N/A;  . VASECTOMY    . VASECTOMY REVERSAL      There were no vitals filed for this visit.   Subjective Assessment - 12/21/19 1451    Subjective No new complaints. No new falls. C/o's of bil knee/foot pain.    Patient is  accompained by: Family member   daughter   Pertinent History RA, HTN, fatty liver due to EtOH abuse, gait disorder, report of increased falls x1 week    Patient Stated Goals Pt wants to gain some weight back to get stronger.    Currently in Pain? Yes    Pain Score --   unable to rate   Pain Location Generalized    Pain Orientation Right;Left    Pain Descriptors / Indicators Aching    Pain Type Chronic pain;Other (Comment)   OA pain   Pain Onset More than a month ago    Pain Frequency Constant    Aggravating Factors  certain movements    Pain Relieving Factors resting, tylenol               [facility] Adult PT Treatment/Exercise - 12/21/19 1518      Transfers   Transfers Sit to Stand;Stand to Sit    Sit to Stand 5: Supervision    Stand to Sit 5: Supervision      Ambulation/Gait   Ambulation/Gait Yes    Ambulation/Gait Assistance 5: Supervision;4: Min guard    Ambulation/Gait Assistance Details around gym in session    Assistive device None    Gait Pattern Step-through pattern;Decreased arm swing - right;Decreased arm swing - left    Ambulation Surface Level;Indoor      Exercises  Exercises Other Exercises    Other Exercises  issued ex's for strengthening and balance. Refer to [place] for full details. Cues needed on correct for and technique.           issued the following to HEP today.    Access Code: [email or web address] URL: [email or web address] Date: 12/21/2019 Prepared by: Willow Ora  Exercises Sit to Stand - 1 x daily - 5 x weekly - 1 sets - 10 reps Side Stepping with Counter Support - 1 x daily - 5 x weekly - 1 sets - 3 reps Walking March - 1 x daily - 5 x weekly - 1 sets - 3 reps Tandem Walking with Counter Support - 1 x daily - 5 x weekly - 1 sets - 3 reps Standing Balance with Eyes Closed on Foam - 1 x daily - 5 x weekly - 1 sets - 3 reps - 30 hold Wide Stance with Head Nods on Foam Pad - 1 x daily - 5 x weekly - 1 sets - 10 reps Wide Stance with Head  Rotation on Foam Pad - 1 x daily - 5 x weekly - 1 sets - 10 reps       PT Education - 12/21/19 1612    Education Details HEP issued for balance and strengthening    Person(s) Educated Patient;Child(ren)    Methods Explanation;Demonstration;Verbal cues;Handout    Comprehension Verbalized understanding;Returned demonstration;Verbal cues required;Need further instruction            PT Short Term Goals - 12/11/19 1724      PT SHORT TERM GOAL #1   Title Pt will be able to perform initial HEP with supervision of family for strengthening and balance.    Time 4    Period Weeks    Status New    Target Date 01/10/20      PT SHORT TERM GOAL #2   Title Pt will increase Berg from 43/56 to >47/56 for improved balance and decreased fall risk.    Baseline 43/56 on 12/11/19    Time 4    Period Weeks    Status New    Target Date 01/10/20      PT SHORT TERM GOAL #3   Title Pt will ambulate >500' on level surfaces without AD independently for improved household mobility and short community distances.    Time 4    Period Weeks    Status New    Target Date 01/10/20      PT SHORT TERM GOAL #4   Title Pt will decreased 5 x sit to stand from 13.51 sec to <11 sec for improved functional strength and balance.    Baseline 13.51 sec from chair without hands on 12/11/19    Time 4    Period Weeks    Status New    Target Date 01/10/20      PT SHORT TERM GOAL #5   Title FGA will be assessed and LTG written.    Time 4    Period Weeks    Status New    Target Date 01/10/20             PT Long Term Goals - 12/11/19 1729      PT LONG TERM GOAL #1   Title Pt will be independent with progressive HEP for balance and strengthening to continue gains on own.    Time 8    Period Weeks    Status New    Target Date 02/09/20  PT LONG TERM GOAL #2   Title Pt will ambulate >1000' on varied surfaces independently for improved community mobility.    Time 8    Period Weeks    Status New     Target Date 02/09/20      PT LONG TERM GOAL #3   Title Pt will increase gait speed from 0.82m/s to >1.35m/s for improved gait safety.    Baseline 0.66m/s on 12/11/19 without AD    Time 8    Period Weeks    Status New    Target Date 02/09/20      PT LONG TERM GOAL #4   Title FGA goal will be written    Time 8    Period Weeks    Status New    Target Date 02/09/20                 Plan - 12/21/19 1454    Clinical Impression Statement Today's skilled session focused on review and revising HEP for home to address strengthening and balance. No issues noted or reported in session today. The pt is progressing toward goals and should benefit from continued PT to progress toward unmet goals.    Personal Factors and Comorbidities Comorbidity 3+    Comorbidities RA, HTN, fatty liver due to EtOH abuse, gait disorder, report of increased fall    Examination-Activity Limitations Stand;Locomotion Level;Transfers    Examination-Participation Restrictions Community Activity;Yard Work;Medication Management    Stability/Clinical Decision Making Evolving/Moderate complexity    Rehab Potential Good    PT Frequency 2x / week    PT Duration 8 weeks    PT Treatment/Interventions ADLs/Self Care Home Management;DME Instruction;Gait training;Stair training;Functional mobility training;Therapeutic activities;Therapeutic exercise;Balance training;Patient/family education;Neuromuscular re-education;Vestibular;Passive range of motion    PT Next Visit Plan Assess FGA so LTG can be written. Gait training without walker, balance training.    Consulted and Agree with Plan of Care Patient;Family member/caregiver    Family Member Consulted daughter           Patient will benefit from skilled therapeutic intervention in order to improve the following deficits and impairments:  Abnormal gait, Decreased cognition, Decreased balance, Decreased activity tolerance, Decreased strength, Decreased mobility, Decreased  knowledge of use of DME, Decreased endurance  Visit Diagnosis: Other abnormalities of gait and mobility  Muscle weakness (generalized)  Unsteadiness on feet     Problem List Patient Active Problem List   Diagnosis Date Noted  . Vascular headache   . TBI (traumatic brain injury) ([place])   . Alcohol-induced chronic pancreatitis ([place])   . Dysphagia   . Fall   . Hypomagnesemia   . Hyponatremia   . Delirium tremens ([place])   . Subarachnoid hemorrhage ([place]) 11/04/2019  . Skull fracture ([place]) 11/04/2019  . Head injury 11/04/2019  . Fall at home, initial encounter 11/04/2019  . Loss of consciousness ([place]) 11/04/2019  . Hyperlipidemia 02/06/2019  . Shuffling gait 12/19/2018  . Abnormal liver function tests 12/15/2018  . Alcoholic liver disease ([place]) 12/15/2018  . RBBB 12/15/2018  . 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 12/15/2018  . Hyperammonemia ([place]) 11/15/2018  . Weight loss, unintentional 11/09/2018  . Frequent falls 11/09/2018  . RA (rheumatoid arthritis) ([place]) 08/17/2017  . Memory change 08/17/2017  . Alcoholism ([place]) 12/15/2016  . Osteoarthritis 01/30/2016  . GERD (gastroesophageal reflux disease) 05/23/2014  . Elevated LDL cholesterol level 05/22/2013  . Erectile dysfunction 05/22/2013  . Tobacco abuse 04/26/2012    Willow Ora, PTA, CLT Outpatient Neuro  [facility] [address] [place], [place] [address] [phone] 12/22/19, 4:14 PM   Name: AARIN BLUETT MRN: [id number] Date of Birth: [birth year]

## 2019-12-26 ENCOUNTER — Other Ambulatory Visit: Payer: Self-pay

## 2019-12-26 ENCOUNTER — Encounter (HOSPITAL_BASED_OUTPATIENT_CLINIC_OR_DEPARTMENT_OTHER): Payer: 59 | Admitting: Physical Medicine and Rehabilitation

## 2019-12-26 ENCOUNTER — Encounter: Payer: Self-pay | Admitting: Physical Medicine and Rehabilitation

## 2019-12-26 VITALS — BP 121/82 | HR 96 | Temp 97.7°F | Ht 68.0 in | Wt 140.0 lb

## 2019-12-26 DIAGNOSIS — F1020 Alcohol dependence, uncomplicated: Secondary | ICD-10-CM

## 2019-12-26 DIAGNOSIS — S069X0S Unspecified intracranial injury without loss of consciousness, sequela: Secondary | ICD-10-CM

## 2019-12-26 DIAGNOSIS — G441 Vascular headache, not elsewhere classified: Secondary | ICD-10-CM

## 2019-12-26 DIAGNOSIS — G911 Obstructive hydrocephalus: Secondary | ICD-10-CM

## 2019-12-26 DIAGNOSIS — W19XXXA Unspecified fall, initial encounter: Secondary | ICD-10-CM

## 2019-12-26 MED ORDER — ESCITALOPRAM OXALATE 10 MG PO TABS: 10.0000 mg | ORAL_TABLET | Freq: Every day | ORAL | 1 refills | Status: DC

## 2019-12-26 MED ORDER — MEGESTROL ACETATE 400 MG/10ML PO SUSP
400.0000 mg | Freq: Two times a day (BID) | ORAL | 0 refills | Status: DC
Start: 2019-12-26 — End: 2020-01-12

## 2019-12-26 MED ORDER — PREDNISONE 1 MG PO TABS
0.5000 mg | ORAL_TABLET | Freq: Every day | ORAL | 0 refills | Status: DC
Start: 2019-12-26 — End: 2020-01-12

## 2019-12-26 MED ORDER — TOPIRAMATE 50 MG PO TABS
50.0000 mg | ORAL_TABLET | Freq: Two times a day (BID) | ORAL | 0 refills | Status: DC
Start: 2019-12-26 — End: 2020-01-12

## 2019-12-26 MED ORDER — MIRTAZAPINE 7.5 MG PO TABS: 7.5000 mg | ORAL_TABLET | Freq: Every day | ORAL | 3 refills | Status: DC

## 2019-12-26 NOTE — Progress Notes (Signed)
Subjective:    Patient ID: William Gibson, male    DOB: Jan 15, 1946, 74 y.o.   MRN: [id number]  HPI  William Gibson is a 74 year old man who presents for follow-up of TBI.  He has been having a lot of weight loss since admission. He has lost 20 lbs. For breakfast he eats cereal with banana, applesauce, sausage egg and cheese biscuit, one bottle of Ensure per day. Most of the time he tells his wife that he is not hungry. Denies depression. BMI reviewed and is 21.29- advised that this is a healthy BMI but his rate of weight loss is significant. Discussed megace as an appetite stimulant. He was prescribed Megace in the hospital and his wife is still giving this to him.  His wife says that he is tired all the time. His head and legs and feet are hurting all the time. Patient says that he is sleeping well at night.  Last week he was getting confused and hallucinating and he had severe headache. Rubbing did not help. His wife gave him Tylenol.   CT scan reviewed with wife and patient.    Pain Inventory Average Pain 5 Pain Right Now 7 My pain is aching  LOCATION OF PAIN  All over the body.   BOWEL Number of stools per week: 7 Oral laxative use No  Type of laxative  Enema or suppository use No  History of colostomy No  Incontinent No   BLADDER Normal In and out cath, frequency  Able to self cath n/a Bladder incontinence No  Frequent urination No  Leakage with coughing No  Difficulty starting stream No  Incomplete bladder emptying No    Mobility walk with assistance use a walker do you drive?  no  Function retired  Neuro/Psych weakness tremor tingling confusion  Prior Studies Any changes since last visit?  no  Physicians involved in your care Any changes since last visit?  no   Family History  Problem Relation Age of Onset  . Lung cancer Father   . Heart disease Mother   . Colon cancer Neg Hx   . Esophageal cancer Neg Hx   . Stomach cancer Neg Hx    Social  History   Socioeconomic History  . Marital status: Married    Spouse name: Not on file  . Number of children: Not on file  . Years of education: Not on file  . Highest education level: Not on file  Occupational History  . Not on file  Tobacco Use  . Smoking status: Current Every Day Smoker    Packs/day: 1.50    Years: 58.00    Pack years: 87.00    Types: Cigarettes  . Smokeless tobacco: Never Used  Vaping Use  . Vaping Use: Never used  Substance and Sexual Activity  . Alcohol use: Not Currently    Comment: 1/5 whiskey a day  . Drug use: No  . Sexual activity: Not Currently  Other Topics Concern  . Not on file  Social History Narrative   Does not have a living will.   Desires CPR, does not want prolonged life support if futile.            Social Determinants of Health   Financial Resource Strain:   . Difficulty of Paying Living Expenses: Not on file  Food Insecurity:   . Worried About Charity fundraiser in the Last Year: Not on file  . Ran Out of Food in the  Last Year: Not on file  Transportation Needs:   . Lack of Transportation (Medical): Not on file  . Lack of Transportation (Non-Medical): Not on file  Physical Activity:   . Days of Exercise per Week: Not on file  . Minutes of Exercise per Session: Not on file  Stress:   . Feeling of Stress : Not on file  Social Connections:   . Frequency of Communication with Friends and Family: Not on file  . Frequency of Social Gatherings with Friends and Family: Not on file  . Attends Religious Services: Not on file  . Active Member of Clubs or Organizations: Not on file  . Attends Archivist Meetings: Not on file  . Marital Status: Not on file   Past Surgical History:  Procedure Laterality Date  . NASAL ENDOSCOPY WITH EPISTAXIS CONTROL N/A 04/12/2018   Procedure: NASAL ENDOSCOPY WITH EPISTAXIS CONTROL;  Surgeon: Melissa Montane, MD;  Location: [facility];  Service: ENT;  Laterality: N/A;  . VASECTOMY    .  VASECTOMY REVERSAL     Past Medical History:  Diagnosis Date  . Abnormal liver function tests 12/15/2018  . Alcoholic liver disease ([place]) 12/15/2018  . Alcoholism ([place])   . Elevated LDL cholesterol level 05/22/2013  . Epistaxis   . Erectile dysfunction 05/22/2013  . Frequent falls 11/09/2018  . GERD (gastroesophageal reflux disease)   . Hyperammonemia ([place]) 11/15/2018  . Memory change 08/17/2017  . RA (rheumatoid arthritis) ([place]) 08/17/2017  . RBBB 12/15/2018  . Rheumatoid arthritis ([place]) 2010  . Shuffling gait 12/19/2018  . Tobacco abuse 04/26/2012  . Weight loss, unintentional 11/09/2018  . 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 12/15/2018   BP 121/82   Pulse 96   Temp 97.7 F (36.5 C)   Ht 5\' 8"  (1.727 m)   Wt 140 lb (63.5 kg)   SpO2 96%   BMI 21.29 kg/m   Opioid Risk Score:   Fall Risk Score:  `1  Depression screen PHQ 2/9  Depression screen [facility] 2/9 12/06/2019 11/16/2018 06/01/2017 01/30/2016 05/22/2013 05/22/2013  Decreased Interest 0 0 0 0 0 0  Down, Depressed, Hopeless 0 0 0 0 0 0  PHQ - 2 Score 0 0 0 0 0 0  Altered sleeping 3 - - - - -  Tired, decreased energy 1 - - - - -  Change in appetite 0 - - - - -  Feeling bad or failure about yourself  0 - - - - -  Trouble concentrating 1 - - - - -  Moving slowly or fidgety/restless 1 - - - - -  Suicidal thoughts 0 - - - - -  PHQ-9 Score 6 - - - - -    Review of Systems  Constitutional: Positive for unexpected weight change.  HENT: Negative.   Eyes: Negative.   Respiratory: Negative.   Cardiovascular: Negative.   Gastrointestinal: Negative.   Endocrine: Negative.   Genitourinary: Negative.   Musculoskeletal: Positive for gait problem.  Skin: Negative.   Allergic/Immunologic: Negative.   Neurological: Positive for tremors, weakness and headaches.       Tingling   Psychiatric/Behavioral: Positive for confusion.  All other systems reviewed and are negative.      Objective:   Physical Exam Gen: no distress,  normal appearing HEENT: oral mucosa pink and moist, hard of hearing Cardio: Reg rate Chest: normal effort, normal rate of breathing Abd: soft, non-distended Ext: no edema Skin: intact Neuro: Alert  Musculoskeletal: Wide based  gait w/o AD Psych: pleasant, does not initiate.      Assessment & Plan:  William Gibson is a 74 year old man who presents for follow-up of TBI following fall after drinking alcohol.   Hydrocephalus: -Mild on CT. Have provided neurosurgery urgent referral.   Impaired mobility and ADLs post-TBI -Continue ouptatient PT and OT  -Discussed FMLA paperwork. He cannot be left alone. He needs 24/7 supervision from his wife.   Decreased appetite: -continue megace. -add mirtazepine 7.5mg  HS to stimulate appetite.   Depression: -Refilled Lexapro. -Mirtazepine will also help.  Hard of hearing: -He is not willing to follow-up with audiology.   Alcoholism: Continue thiamine and folic acid  All questions answered. 40 minutes spent in discussing loss of weight, the amount of care he needs from his wife, hard of hearing, discussing FMLA paperwork, mobility, reviewing head CT and discussing diagnosis and management of hydrocephalus, providing NSGY referral, discussing mirtazepine for depression/decreased appetite/to replace trazodone for insomnia

## 2019-12-27 ENCOUNTER — Telehealth: Payer: Self-pay | Admitting: Physical Medicine and Rehabilitation

## 2019-12-27 ENCOUNTER — Telehealth: Payer: Self-pay | Admitting: *Deleted

## 2019-12-27 NOTE — Telephone Encounter (Signed)
Left voicemail that FMLA paperwork is ready to be picked up and there is a $25 charge.  Will leave at front desk.

## 2019-12-27 NOTE — Telephone Encounter (Addendum)
Patients wife [name] contacted our office.  She is deeply concerned that her husband is suffering from severe depression.  She says he is staying in bed all the time. Does not want to get out of bed.  He is telling his grand kids to keep quiet. He is getting upset over loud noise.  Daughter was over and witnessed patient vomiting of the deck.  Wife is very concerned and would like Dr. Ranell Patrick to please call.

## 2019-12-28 ENCOUNTER — Telehealth: Payer: Self-pay

## 2019-12-28 MED ORDER — PANTOPRAZOLE SODIUM 40 MG PO TBEC
40.0000 mg | DELAYED_RELEASE_TABLET | Freq: Every day | ORAL | 0 refills | Status: DC
Start: 2019-12-28 — End: 2020-01-12

## 2019-12-28 MED ORDER — MIRTAZAPINE 15 MG PO TABS: 15.0000 mg | ORAL_TABLET | Freq: Every day | ORAL | 3 refills | Status: DC

## 2019-12-28 NOTE — Telephone Encounter (Signed)
Hi Robbin,  I called her earlier today to discuss his depression, will not get a chance to again today. Can you please let her know that I put in an urgent referral for her on Wednesday? April, were you able to send this referral? Thank you both! Best, Martha Clan

## 2019-12-28 NOTE — Telephone Encounter (Signed)
William Gibson is still waiting on the Urgent referral to Neuro Surgery for shunt placement. He has an appointment at Dr. Melven Sartorius office on 01/10/2020.  William Gibson would like him seen sooner for surgical evaluation. Can you please call her?   Call back phone number is [phone].  Thank you

## 2019-12-28 NOTE — Telephone Encounter (Signed)
I have called her, thank you!

## 2019-12-28 NOTE — Addendum Note (Signed)
Addended by: Izora Ribas on: 12/28/2019 04:58 PM   Modules accepted: Orders

## 2019-12-29 ENCOUNTER — Ambulatory Visit: Payer: 59 | Attending: Physical Medicine and Rehabilitation

## 2019-12-29 ENCOUNTER — Other Ambulatory Visit: Payer: Self-pay

## 2019-12-29 ENCOUNTER — Ambulatory Visit: Payer: 59

## 2019-12-29 VITALS — BP 108/62

## 2019-12-29 DIAGNOSIS — R2681 Unsteadiness on feet: Secondary | ICD-10-CM

## 2019-12-29 DIAGNOSIS — R1312 Dysphagia, oropharyngeal phase: Secondary | ICD-10-CM | POA: Insufficient documentation

## 2019-12-29 DIAGNOSIS — M6281 Muscle weakness (generalized): Secondary | ICD-10-CM | POA: Insufficient documentation

## 2019-12-29 DIAGNOSIS — R296 Repeated falls: Secondary | ICD-10-CM | POA: Insufficient documentation

## 2019-12-29 DIAGNOSIS — R41841 Cognitive communication deficit: Secondary | ICD-10-CM | POA: Diagnosis not present

## 2019-12-29 DIAGNOSIS — R2689 Other abnormalities of gait and mobility: Secondary | ICD-10-CM

## 2019-12-29 NOTE — Telephone Encounter (Signed)
I will give her a call now!

## 2019-12-29 NOTE — Therapy (Signed)
[facility] [address] [place], [place], [address] Phone: [phone]   Fax:  [phone]  Physical Therapy Treatment  Patient Details  Name: William Gibson MRN: [id number] Date of Birth: 27-Feb-1946 Referring Provider (PT): Reesa Chew   Encounter Date: 12/29/2019   PT End of Session - 12/29/19 1409    Visit Number 3    Number of Visits 17    Date for PT Re-Evaluation 79/02/40   90 day cert but 60 day [place] and med A secondary so 10th visit progress note    PT Start Time 1408   late getting out of ST.   PT Stop Time 1446    PT Time Calculation (min) 38 min    Equipment Utilized During Treatment Gait belt    Activity Tolerance Patient tolerated treatment well    Behavior During Therapy WFL for tasks assessed/performed           Past Medical History:  Diagnosis Date  . Abnormal liver function tests 12/15/2018  . Alcoholic liver disease ([place]) 12/15/2018  . Alcoholism ([place])   . Elevated LDL cholesterol level 05/22/2013  . Epistaxis   . Erectile dysfunction 05/22/2013  . Frequent falls 11/09/2018  . GERD (gastroesophageal reflux disease)   . Hyperammonemia ([place]) 11/15/2018  . Memory change 08/17/2017  . RA (rheumatoid arthritis) ([place]) 08/17/2017  . RBBB 12/15/2018  . Rheumatoid arthritis ([place]) 2010  . Shuffling gait 12/19/2018  . Tobacco abuse 04/26/2012  . Weight loss, unintentional 11/09/2018  . 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 12/15/2018    Past Surgical History:  Procedure Laterality Date  . NASAL ENDOSCOPY WITH EPISTAXIS CONTROL N/A 04/12/2018   Procedure: NASAL ENDOSCOPY WITH EPISTAXIS CONTROL;  Surgeon: Melissa Montane, MD;  Location: [facility];  Service: ENT;  Laterality: N/A;  . VASECTOMY    . VASECTOMY REVERSAL      Vitals:   12/29/19 1408  BP: 108/62     Subjective Assessment - 12/29/19 1408    Subjective Pt just finished ST. ST said processing a little slower. Chart  did say shunt may be needed so he is sending an inbasket to MD. Not scheduled to see Dr. Khoa Pore until 9/16. Pt's daughter reports that he has lost 8lbs the last 2 weeks.    Patient is accompained by: Family member   wife   Pertinent History RA, HTN, fatty liver due to EtOH abuse, gait disorder, report of increased falls x1 week    Patient Stated Goals Pt wants to gain some weight back to get stronger.    Currently in Pain? Yes    Pain Score 4     Pain Location Leg    Pain Orientation Right;Left    Pain Descriptors / Indicators Aching    Pain Type Chronic pain    Pain Onset More than a month ago              [facility] PT Assessment - 12/29/19 1412      Functional Gait  Assessment   Gait assessed  Yes    Gait Level Surface Walks 20 ft, slow speed, abnormal gait pattern, evidence for imbalance or deviates 10-15 in outside of the 12 in walkway width. Requires more than 7 sec to ambulate 20 ft.   8 sec   Change in Gait Speed Able to change speed, demonstrates mild gait deviations, deviates 6-10 in outside of the 12 in walkway width, or no gait  deviations, unable to achieve a major change in velocity, or uses a change in velocity, or uses an assistive device.    Gait with Horizontal Head Turns Performs head turns with moderate changes in gait velocity, slows down, deviates 10-15 in outside 12 in walkway width but recovers, can continue to walk.    Gait with Vertical Head Turns Performs task with moderate change in gait velocity, slows down, deviates 10-15 in outside 12 in walkway width but recovers, can continue to walk.    Gait and Pivot Turn Turns slowly, requires verbal cueing, or requires several small steps to catch balance following turn and stop    Step Over Obstacle Is able to step over one shoe box (4.5 in total height) without changing gait speed. No evidence of imbalance.    Gait with Narrow Base of Support Ambulates less than 4 steps heel to toe or cannot perform without assistance.     Gait with Eyes Closed Walks 20 ft, slow speed, abnormal gait pattern, evidence for imbalance, deviates 10-15 in outside 12 in walkway width. Requires more than 9 sec to ambulate 20 ft.    Ambulating Backwards Walks 20 ft, uses assistive device, slower speed, mild gait deviations, deviates 6-10 in outside 12 in walkway width.    Steps Alternating feet, must use rail.    Total Score 13                         [facility] Adult PT Treatment/Exercise - 12/29/19 1412      Transfers   Transfers Sit to Stand;Stand to Sit    Sit to Stand 5: Supervision    Stand to Sit 5: Supervision      Ambulation/Gait   Ambulation/Gait Yes    Ambulation/Gait Assistance 5: Supervision    Ambulation/Gait Assistance Details Pt ambulated first bout without AD with verbal cues to increase step length and arm swing. Then ambulated 2nd bout with RW. Pt was cued to stay up in walker. Did better with controlling and increased cadence and step length with walker compared to when first walked in to session.    Ambulation Distance (Feet) 345 Feet   115' with RW supervision.   Assistive device Rolling walker;None    Gait Pattern Step-through pattern;Decreased step length - right;Decreased step length - left    Ambulation Surface Level;Indoor    Gait Comments BP=130/80 after FGA testing      Self-Care   Self-Care Other Self-Care Comments    Other Self-Care Comments  Pt was encouraged to eat snacks more frequently during day as not hungry for big meals. Pt also getting high protein ensure 1x/day. Wife may add another one.      Neuro Re-ed    Neuro Re-ed Details  Pt performed gait with RW weaving around 4 cones and over red mat x 4 close SBA. 1 stumble around cones needing walker to help stabilize.                   PT Education - 12/29/19 1548    Education Details PT discussed with patient and wife that she wants him to use walker right now and not be outside by himself. Explained results of FGA  indicating that he is high fall risk.    Person(s) Educated Patient;Spouse    Methods Explanation    Comprehension Verbalized understanding            PT Short Term Goals - 12/29/19 1549  PT SHORT TERM GOAL #1   Title Pt will be able to perform initial HEP with supervision of family for strengthening and balance.    Time 4    Period Weeks    Status New    Target Date 01/10/20      PT SHORT TERM GOAL #2   Title Pt will increase Berg from 43/56 to >47/56 for improved balance and decreased fall risk.    Baseline 43/56 on 12/11/19    Time 4    Period Weeks    Status New    Target Date 01/10/20      PT SHORT TERM GOAL #3   Title Pt will ambulate >500' on level surfaces without AD independently for improved household mobility and short community distances.    Time 4    Period Weeks    Status New    Target Date 01/10/20      PT SHORT TERM GOAL #4   Title Pt will decreased 5 x sit to stand from 13.51 sec to <11 sec for improved functional strength and balance.    Baseline 13.51 sec from chair without hands on 12/11/19    Time 4    Period Weeks    Status New    Target Date 01/10/20      PT SHORT TERM GOAL #5   Title FGA will be assessed and LTG written.    Baseline FGA performed on 12/29/19 with 13/30    Time 4    Period Weeks    Status Achieved    Target Date 01/10/20             PT Long Term Goals - 12/29/19 1550      PT LONG TERM GOAL #1   Title Pt will be independent with progressive HEP for balance and strengthening to continue gains on own.    Time 8    Period Weeks    Status New      PT LONG TERM GOAL #2   Title Pt will ambulate >1000' on varied surfaces independently for improved community mobility.    Time 8    Period Weeks    Status New      PT LONG TERM GOAL #3   Title Pt will increase gait speed from 0.71m/s to >1.65m/s for improved gait safety.    Baseline 0.57m/s on 12/11/19 without AD    Time 8    Period Weeks    Status New      PT LONG  TERM GOAL #4   Title Pt will increase FGA from 13/30 to >19/30 for improved gait safety and decreased fall risk.    Baseline 13/30 on 12/29/19    Time 8    Period Weeks    Status New                 Plan - 12/29/19 1550    Clinical Impression Statement PT assessed FGA today with score of 13/30 indicating high fall risk. Advised pt and wife that he should use walker right now. We will work with AD more in therapy.    Personal Factors and Comorbidities Comorbidity 3+    Comorbidities RA, HTN, fatty liver due to EtOH abuse, gait disorder, report of increased fall    Examination-Activity Limitations Stand;Locomotion Level;Transfers    Examination-Participation Restrictions Community Activity;Yard Work;Medication Management    Stability/Clinical Decision Making Evolving/Moderate complexity    Rehab Potential Good    PT Frequency 2x / week  PT Duration 8 weeks    PT Treatment/Interventions ADLs/Self Care Home Management;DME Instruction;Gait training;Stair training;Functional mobility training;Therapeutic activities;Therapeutic exercise;Balance training;Patient/family education;Neuromuscular re-education;Vestibular;Passive range of motion    PT Next Visit Plan Gait training without walker (I did advise him to use right now at home), balance training. Pt is hard of hearing. Was a little slower with responses at visit today and ST reaching out to MD as was mention of possible shunt need in chart.    Consulted and Agree with Plan of Care Patient;Family member/caregiver    Family Member Consulted wife           Patient will benefit from skilled therapeutic intervention in order to improve the following deficits and impairments:  Abnormal gait, Decreased cognition, Decreased balance, Decreased activity tolerance, Decreased strength, Decreased mobility, Decreased knowledge of use of DME, Decreased endurance  Visit Diagnosis: Other abnormalities of gait and mobility  Muscle weakness  (generalized)  Unsteadiness on feet     Problem List Patient Active Problem List   Diagnosis Date Noted  . Vascular headache   . TBI (traumatic brain injury) ([place])   . Alcohol-induced chronic pancreatitis ([place])   . Dysphagia   . Fall   . Hypomagnesemia   . Hyponatremia   . Delirium tremens ([place])   . Subarachnoid hemorrhage ([place]) 11/04/2019  . Skull fracture ([place]) 11/04/2019  . Head injury 11/04/2019  . Fall at home, initial encounter 11/04/2019  . Loss of consciousness ([place]) 11/04/2019  . Hyperlipidemia 02/06/2019  . Shuffling gait 12/19/2018  . Abnormal liver function tests 12/15/2018  . Alcoholic liver disease ([place]) 12/15/2018  . RBBB 12/15/2018  . 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 12/15/2018  . Hyperammonemia ([place]) 11/15/2018  . Weight loss, unintentional 11/09/2018  . Frequent falls 11/09/2018  . RA (rheumatoid arthritis) ([place]) 08/17/2017  . Memory change 08/17/2017  . Alcoholism ([place]) 12/15/2016  . Osteoarthritis 01/30/2016  . GERD (gastroesophageal reflux disease) 05/23/2014  . Elevated LDL cholesterol level 05/22/2013  . Erectile dysfunction 05/22/2013  . Tobacco abuse 04/26/2012    Electa Sniff, PT, DPT, NCS 12/29/2019, 3:54 PM  [facility] [address] [place], [place], [address] Phone: [phone]   Fax:  [phone]  Name: William Gibson MRN: [id number] Date of Birth: 1946-03-07

## 2019-12-29 NOTE — Therapy (Signed)
[facility] [address] [place], [place], [address] Phone: [phone]   Fax:  [phone]  Speech Language Pathology Treatment  Patient Details  Name: William Gibson MRN: [id number] Date of Birth: [birth year] Referring Provider (SLP): Reesa Chew, [place]   Encounter Date: 12/29/2019   End of Session - 12/29/19 2352    Visit Number 2    Number of Visits 17    Date for SLP Re-Evaluation 03/08/20   90 days   SLP Start Time 1319    SLP Stop Time  1400    SLP Time Calculation (min) 41 min    Activity Tolerance Patient tolerated treatment well           Past Medical History:  Diagnosis Date  . Abnormal liver function tests 12/15/2018  . Alcoholic liver disease ([place]) 12/15/2018  . Alcoholism ([place])   . Elevated LDL cholesterol level 05/22/2013  . Epistaxis   . Erectile dysfunction 05/22/2013  . Frequent falls 11/09/2018  . GERD (gastroesophageal reflux disease)   . Hyperammonemia ([place]) 11/15/2018  . Memory change 08/17/2017  . RA (rheumatoid arthritis) ([place]) 08/17/2017  . RBBB 12/15/2018  . Rheumatoid arthritis ([place]) 2010  . Shuffling gait 12/19/2018  . Tobacco abuse 04/26/2012  . Weight loss, unintentional 11/09/2018  . 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 12/15/2018    Past Surgical History:  Procedure Laterality Date  . NASAL ENDOSCOPY WITH EPISTAXIS CONTROL N/A 04/12/2018   Procedure: NASAL ENDOSCOPY WITH EPISTAXIS CONTROL;  Surgeon: Melissa Montane, MD;  Location: [facility];  Service: ENT;  Laterality: N/A;  . VASECTOMY    . VASECTOMY REVERSAL      There were no vitals filed for this visit.   Subjective Assessment - 12/29/19 2351    Subjective SLP notes pt's chart about concern re: depression and shunt placement. "Sometimes when he wants to say something he can't get it out."    Patient is accompained by: Family member   wife - [name]   Currently in Pain? Yes    Pain Score 4     Pain Location Abdomen     Pain Orientation Right;Left    Pain Descriptors / Indicators Aching    Pain Type Chronic pain                 ADULT SLP TREATMENT - 12/29/19 1326      General Information   Behavior/Cognition Hard of hearing;Alert    HPI Wife: "Dr. Leafy Half office called and said there was fluid built up and he would need a shunt placed."      Treatment Provided   Treatment provided Cognitive-Linquistic      Cognitive-Linquistic Treatment   Treatment focused on Cognition    Skilled Treatment Wife reports symptoms of aphasia beginning in last two weeks, however no hx of any aphasia in pt's previous rehabilitative hx. Pt's processing today appears much slower than at evaluation. Wife also reports pt stood in front of his bed early this morning and "didn't do anything - just stared at it for a while." SLP targeted pt's attention as he suspects this is basis for pt's word finding and pt req'd mod-max A to name yard tools, and mod A to name brands of cars - pt thought of 3/4 of the brands he owns - req'd total A to think of the last car.       Assessment / Recommendations / Plan   Plan Continue with current plan of care  Progression Toward Goals   Progression toward goals Not progressing toward goals (comment)   pt presenting with worse processing than eval             SLP Short Term Goals - 12/29/19 2354      SLP SHORT TERM GOAL #1   Title pt indicate affirmation of cogntive linguistic deficits in 3 therapy sessions    Time 4    Period Weeks   or 9 total sessions, for all STGs   Status On-going      SLP SHORT TERM GOAL #2   Title pt demonstrate appropriate problem solving skills for functional situations with occasional min A in 3 sessions    Time 4    Period Weeks    Status On-going      SLP SHORT TERM GOAL #3   Title pt will indicate need for memory copmensation techniques x3 sessions    Time 4    Period Weeks    Status On-going      SLP SHORT TERM GOAL #4   Title pt will  demonstrate improvement in medication administration by correctly indicating to wife when he needs meds for 5 consecutive days as reported by pt and/or wife    Time 4    Period Weeks    Status On-going            SLP Long Term Goals - 12/29/19 2354      SLP LONG TERM GOAL #1   Title pt will demo evidence of emergent awareness (correcting his errors) on written or verbal tasks in 85% of opportunities in 3 sessions    Time 8    Period Weeks   or 17 total sessions, for all LTGs   Status On-going      SLP LONG TERM GOAL #2   Title pt will appropriately solve problems with finances, medication, etc with modified independence in 3 sessions    Time 8    Period Weeks    Status On-going      SLP LONG TERM GOAL #3   Title pt will administer his own medicine for 7 consecutive days with modified independence    Time 8    Period Weeks    Status On-going      SLP LONG TERM GOAL #4   Title pt will use of a memory compensatoin system successfully between 4 sessions    Time 8    Period Weeks    Status On-going      SLP LONG TERM GOAL #5   Title pt/family will indicate 3 overt s/sx aspiraiton PNA with modifeid independence    Time 8    Period Weeks    Status On-going            Plan - 12/29/19 2353    Clinical Impression Statement Pt presents today with cognitive communication deficits in the areas of (as tested) awareness, memory, and problem solving/executive function. Pt with significant difficulty with attention/processing today, not observed on eval. SLP contacted pt's physiatrist to make her aware - pt has appointment with neurosurgeon on 01-10-20. Pt's deficits are affecting practical activities such as his wife now administers his medicine to him, and pt is unable to work in the yard. Pt was d/c'd with 24 hour supervision recommended. SLP recommends and pt was agreeable today to skiled ST x2/week x8 weeks, adjusting the schedule as needed depending on pt progress. Pt was d/c;d from  [facility] with recommendation of straw sips with water but  today looked fine with cup sips x3 (multiple, consecutive sips). SLP recommended to pt/daughter that pt can take liquids via cup, or a straw if desired.    Speech Therapy Frequency 2x / week    Duration --   8 weeks or 17 total visits   Treatment/Interventions Diet toleration management by SLP;Trials of upgraded texture/liquids;Cueing hierarchy;Cognitive reorganization;Internal/external aids;Patient/family education;Compensatory strategies;SLP instruction and feedback;Functional tasks;Environmental controls    Potential to Achieve Goals Good    Potential Considerations Cooperation/participation level    Consulted and Agree with Plan of Care Patient           Patient will benefit from skilled therapeutic intervention in order to improve the following deficits and impairments:   Cognitive communication deficit  Dysphagia, oropharyngeal phase    Problem List Patient Active Problem List   Diagnosis Date Noted  . Vascular headache   . TBI (traumatic brain injury) ([place])   . Alcohol-induced chronic pancreatitis ([place])   . Dysphagia   . Fall   . Hypomagnesemia   . Hyponatremia   . Delirium tremens ([place])   . Subarachnoid hemorrhage ([place]) 11/04/2019  . Skull fracture ([place]) 11/04/2019  . Head injury 11/04/2019  . Fall at home, initial encounter 11/04/2019  . Loss of consciousness ([place]) 11/04/2019  . Hyperlipidemia 02/06/2019  . Shuffling gait 12/19/2018  . Abnormal liver function tests 12/15/2018  . Alcoholic liver disease ([place]) 12/15/2018  . RBBB 12/15/2018  . 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 12/15/2018  . Hyperammonemia ([place]) 11/15/2018  . Weight loss, unintentional 11/09/2018  . Frequent falls 11/09/2018  . RA (rheumatoid arthritis) ([place]) 08/17/2017  . Memory change 08/17/2017  . Alcoholism ([place]) 12/15/2016  . Osteoarthritis 01/30/2016  . GERD (gastroesophageal reflux disease) 05/23/2014  . Elevated LDL  cholesterol level 05/22/2013  . Erectile dysfunction 05/22/2013  . Tobacco abuse 04/26/2012    [facility] ,[place], [place]  12/29/2019, 11:55 PM  [facility] [address] [place], [place], [address] Phone: [phone]   Fax:  [phone]   Name: LOYS SHUGARS MRN: [id number] Date of Birth: October 10, 1945

## 2020-01-03 NOTE — Telephone Encounter (Signed)
Hi April, the referral was for neurosurgery!

## 2020-01-05 ENCOUNTER — Other Ambulatory Visit: Payer: Self-pay

## 2020-01-05 ENCOUNTER — Ambulatory Visit: Payer: 59

## 2020-01-05 VITALS — BP 113/74 | HR 84

## 2020-01-05 DIAGNOSIS — M6281 Muscle weakness (generalized): Secondary | ICD-10-CM

## 2020-01-05 DIAGNOSIS — R1312 Dysphagia, oropharyngeal phase: Secondary | ICD-10-CM

## 2020-01-05 DIAGNOSIS — R2689 Other abnormalities of gait and mobility: Secondary | ICD-10-CM

## 2020-01-05 DIAGNOSIS — R2681 Unsteadiness on feet: Secondary | ICD-10-CM

## 2020-01-05 DIAGNOSIS — R296 Repeated falls: Secondary | ICD-10-CM

## 2020-01-05 DIAGNOSIS — R41841 Cognitive communication deficit: Secondary | ICD-10-CM

## 2020-01-05 NOTE — Therapy (Signed)
[facility] [address] [place], [place], [address] Phone: [phone]   Fax:  [phone]  Physical Therapy Treatment  Patient Details  Name: William Gibson MRN: [id number] Date of Birth: 27-Sep-1945 Referring Provider (PT): Reesa Chew   Encounter Date: 01/05/2020   PT End of Session - 01/05/20 1452    Visit Number 4    Number of Visits 17    Date for PT Re-Evaluation 58/83/25   90 day cert but 60 day poc   Authorization Type [employer] and med A secondary so 10th visit progress note    PT Start Time 1401    PT Stop Time 1445    PT Time Calculation (min) 44 min    Equipment Utilized During Treatment Gait belt    Activity Tolerance Patient tolerated treatment well    Behavior During Therapy [facility] for tasks assessed/performed           Past Medical History:  Diagnosis Date  . Abnormal liver function tests 12/15/2018  . Alcoholic liver disease ([place]) 12/15/2018  . Alcoholism ([place])   . Elevated LDL cholesterol level 05/22/2013  . Epistaxis   . Erectile dysfunction 05/22/2013  . Frequent falls 11/09/2018  . GERD (gastroesophageal reflux disease)   . Hyperammonemia ([place]) 11/15/2018  . Memory change 08/17/2017  . RA (rheumatoid arthritis) ([place]) 08/17/2017  . RBBB 12/15/2018  . Rheumatoid arthritis ([place]) 2010  . Shuffling gait 12/19/2018  . Tobacco abuse 04/26/2012  . Weight loss, unintentional 11/09/2018  . 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 12/15/2018    Past Surgical History:  Procedure Laterality Date  . NASAL ENDOSCOPY WITH EPISTAXIS CONTROL N/A 04/12/2018   Procedure: NASAL ENDOSCOPY WITH EPISTAXIS CONTROL;  Surgeon: Melissa Montane, MD;  Location: [facility];  Service: ENT;  Laterality: N/A;  . VASECTOMY    . VASECTOMY REVERSAL      Vitals:   01/05/20 1442  BP: 113/74  Pulse: 84     Subjective Assessment - 01/05/20 1404    Subjective Patient just finished up with ST. Wife reports that patient had a fall last  night coming through the door without the RW and landed on the knees. No injuries.    Patient is accompained by: Family member   wife   Pertinent History RA, HTN, fatty liver due to EtOH abuse, gait disorder, report of increased falls x1 week    Patient Stated Goals Pt wants to gain some weight back to get stronger.    Currently in Pain? No/denies    Pain Onset More than a month ago                             [facility] Adult PT Treatment/Exercise - 01/05/20 0001      Ambulation/Gait   Ambulation/Gait Yes    Ambulation/Gait Assistance 4: Min guard;5: Supervision    Ambulation/Gait Assistance Details completed ambulation with RW x 115 ft, supervision level. Progressed to completing ambulation without RW, patient requiring CGA throughout with patient demonstrating small shuffled steps and increased forward lean at times requiring assistance from PT to keep from losing balance forward. Completed ambulation without AD x 345 ft. Verbal cues for improved step length and slowed speed to promote control with ambulation    Ambulation Distance (Feet) 345 Feet   x1, 115 x1 with supervision with RW   Assistive device Rolling walker;None    Gait Pattern Step-through pattern;Decreased step length -  right;Decreased step length - left    Ambulation Surface Level;Indoor    Gait Comments BP after activities = 113/74, HR = 84      Self-Care   Self-Care Other Self-Care Comments    Other Self-Care Comments  ST informed PT that patient wanted to work out in yard this weekend. PT advising against yard work at this time due to increased risk for falls espeically on outdoor unlevel surfaces. Wife verbalized agreement.       Neuro Re-ed    Neuro Re-ed Details  Completed entire review of current HEP per wife's request.      Exercises   Exercises Other Exercises    Other Exercises  completed hamstring stretch on BLE's, 2 x 30 seconds each. Added to HEP due to patient reports of tightness. Educated on  proper form with completion.                Balance Exercises - 01/05/20 0001      Balance Exercises: Standing   Standing Eyes Opened Narrow base of support (BOS);Head turns;Foam/compliant surface;Limitations    Standing Eyes Opened Limitations horizontal/vertical head turns 2 x 10 repetitions each. CGA     Standing Eyes Closed Narrow base of support (BOS);Foam/compliant surface;3 reps;Time    Standing Eyes Closed Time 20-25 seconds    Tandem Stance Eyes open;Intermittent upper extremity support;3 reps;30 secs;Limitations    Tandem Stance Time alteranting foot position with repetition, modiefied tandem required due to imbalance with full tandem stance    Tandem Gait Forward;Intermittent upper extremity support;3 reps;Limitations    Tandem Gait Limitations completed at countertop with light UE support    Marching Solid surface;Intermittent upper extremity assist;Forwards;5 reps;Limitations    Marching Limitations completed at countertop with light UE support             PT Education - 01/05/20 1453    Education Details HEP update (hamstring stretch). Continued education on fall risk and use of RW, educating to not complete outdoor yard work at this time.    Person(s) Educated Patient;Spouse    Methods Explanation;Demonstration;Handout    Comprehension Verbalized understanding;Returned demonstration;Need further instruction            PT Short Term Goals - 12/29/19 1549      PT SHORT TERM GOAL #1   Title Pt will be able to perform initial HEP with supervision of family for strengthening and balance.    Time 4    Period Weeks    Status New    Target Date 01/10/20      PT SHORT TERM GOAL #2   Title Pt will increase Berg from 43/56 to >47/56 for improved balance and decreased fall risk.    Baseline 43/56 on 12/11/19    Time 4    Period Weeks    Status New    Target Date 01/10/20      PT SHORT TERM GOAL #3   Title Pt will ambulate >500' on level surfaces without AD  independently for improved household mobility and short community distances.    Time 4    Period Weeks    Status New    Target Date 01/10/20      PT SHORT TERM GOAL #4   Title Pt will decreased 5 x sit to stand from 13.51 sec to <11 sec for improved functional strength and balance.    Baseline 13.51 sec from chair without hands on 12/11/19    Time 4    Period Weeks  Status New    Target Date 01/10/20      PT SHORT TERM GOAL #5   Title FGA will be assessed and LTG written.    Baseline FGA performed on 12/29/19 with 13/30    Time 4    Period Weeks    Status Achieved    Target Date 01/10/20             PT Long Term Goals - 12/29/19 1550      PT LONG TERM GOAL #1   Title Pt will be independent with progressive HEP for balance and strengthening to continue gains on own.    Time 8    Period Weeks    Status New      PT LONG TERM GOAL #2   Title Pt will ambulate >1000' on varied surfaces independently for improved community mobility.    Time 8    Period Weeks    Status New      PT LONG TERM GOAL #3   Title Pt will increase gait speed from 0.59m/s to >1.31m/s for improved gait safety.    Baseline 0.26m/s on 12/11/19 without AD    Time 8    Period Weeks    Status New      PT LONG TERM GOAL #4   Title Pt will increase FGA from 13/30 to >19/30 for improved gait safety and decreased fall risk.    Baseline 13/30 on 12/29/19    Time 8    Period Weeks    Status New                 Plan - 01/05/20 1502    Clinical Impression Statement Today's session included gait trianing without AD, patient continued to demo increased forward lean and increased cadence require CGA to maintain balance. Continued education on use of RW for improved safety and to reduce falls. Will continue to progress toward goals.    Personal Factors and Comorbidities Comorbidity 3+    Comorbidities RA, HTN, fatty liver due to EtOH abuse, gait disorder, report of increased fall    Examination-Activity  Limitations Stand;Locomotion Level;Transfers    Examination-Participation Restrictions Community Activity;Yard Work;Medication Management    Stability/Clinical Decision Making Evolving/Moderate complexity    Rehab Potential Good    PT Frequency 2x / week    PT Duration 8 weeks    PT Treatment/Interventions ADLs/Self Care Home Management;DME Instruction;Gait training;Stair training;Functional mobility training;Therapeutic activities;Therapeutic exercise;Balance training;Patient/family education;Neuromuscular re-education;Vestibular;Passive range of motion    PT Next Visit Plan How was HEP addition? Gait training without walker, balance training. Pt is hard of hearing. Was a little slower with responses at visit today and ST reaching out to MD as was mention of possible shunt need in chart.    Consulted and Agree with Plan of Care Patient;Family member/caregiver    Family Member Consulted wife           Patient will benefit from skilled therapeutic intervention in order to improve the following deficits and impairments:  Abnormal gait, Decreased cognition, Decreased balance, Decreased activity tolerance, Decreased strength, Decreased mobility, Decreased knowledge of use of DME, Decreased endurance  Visit Diagnosis: Other abnormalities of gait and mobility  Muscle weakness (generalized)  Unsteadiness on feet  Frequent falls     Problem List Patient Active Problem List   Diagnosis Date Noted  . Vascular headache   . TBI (traumatic brain injury) ([place])   . Alcohol-induced chronic pancreatitis ([place])   . Dysphagia   . Fall   .  Hypomagnesemia   . Hyponatremia   . Delirium tremens ([place])   . Subarachnoid hemorrhage ([place]) 11/04/2019  . Skull fracture ([place]) 11/04/2019  . Head injury 11/04/2019  . Fall at home, initial encounter 11/04/2019  . Loss of consciousness ([place]) 11/04/2019  . Hyperlipidemia 02/06/2019  . Shuffling gait 12/19/2018  . Abnormal liver function tests 12/15/2018    . Alcoholic liver disease ([place]) 12/15/2018  . RBBB 12/15/2018  . 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 12/15/2018  . Hyperammonemia ([place]) 11/15/2018  . Weight loss, unintentional 11/09/2018  . Frequent falls 11/09/2018  . RA (rheumatoid arthritis) ([place]) 08/17/2017  . Memory change 08/17/2017  . Alcoholism ([place]) 12/15/2016  . Osteoarthritis 01/30/2016  . GERD (gastroesophageal reflux disease) 05/23/2014  . Elevated LDL cholesterol level 05/22/2013  . Erectile dysfunction 05/22/2013  . Tobacco abuse 04/26/2012    Jones Bales, PT, DPT 01/05/2020, 3:08 PM  [facility] [address] [place], [place], [address] Phone: [phone]   Fax:  [phone]  Name: ALASTAIR HENNES MRN: [id number] Date of Birth: 1946/03/22

## 2020-01-05 NOTE — Patient Instructions (Signed)
Access Code: [email or web address] URL: [email or web address] Date: 01/05/2020 Prepared by: Baldomero Lamy  Exercises Sit to Stand - 1 x daily - 5 x weekly - 1 sets - 10 reps Side Stepping with Counter Support - 1 x daily - 5 x weekly - 1 sets - 3 reps Walking March - 1 x daily - 5 x weekly - 1 sets - 3 reps Tandem Walking with Counter Support - 1 x daily - 5 x weekly - 1 sets - 3 reps Standing Balance with Eyes Closed on Foam - 1 x daily - 5 x weekly - 1 sets - 3 reps - 30 hold Wide Stance with Head Nods on Foam Pad - 1 x daily - 5 x weekly - 1 sets - 10 reps Wide Stance with Head Rotation on Foam Pad - 1 x daily - 5 x weekly - 1 sets - 10 reps Seated Hamstring Stretch - 1 x daily - 5 x weekly - 1 sets - 3 reps - 30 seconds hold

## 2020-01-05 NOTE — Therapy (Signed)
[facility] [address] [place], [place], [address] Phone: [phone]   Fax:  [phone]  Speech Language Pathology Treatment  Patient Details  Name: William Gibson MRN: [id number] Date of Birth: 1945-08-02 Referring Provider (SLP): Reesa Chew, [place]   Encounter Date: 01/05/2020   End of Session - 01/05/20 1402    Visit Number 3    Number of Visits 17    Date for SLP Re-Evaluation 03/08/20   90 days   SLP Start Time 0962    SLP Stop Time  8366    SLP Time Calculation (min) 37 min    Activity Tolerance Patient tolerated treatment well           Past Medical History:  Diagnosis Date  . Abnormal liver function tests 12/15/2018  . Alcoholic liver disease ([place]) 12/15/2018  . Alcoholism ([place])   . Elevated LDL cholesterol level 05/22/2013  . Epistaxis   . Erectile dysfunction 05/22/2013  . Frequent falls 11/09/2018  . GERD (gastroesophageal reflux disease)   . Hyperammonemia ([place]) 11/15/2018  . Memory change 08/17/2017  . RA (rheumatoid arthritis) ([place]) 08/17/2017  . RBBB 12/15/2018  . Rheumatoid arthritis ([place]) 2010  . Shuffling gait 12/19/2018  . Tobacco abuse 04/26/2012  . Weight loss, unintentional 11/09/2018  . 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 12/15/2018    Past Surgical History:  Procedure Laterality Date  . NASAL ENDOSCOPY WITH EPISTAXIS CONTROL N/A 04/12/2018   Procedure: NASAL ENDOSCOPY WITH EPISTAXIS CONTROL;  Surgeon: Melissa [place]ane, MD;  Location: [facility];  Service: ENT;  Laterality: N/A;  . VASECTOMY    . VASECTOMY REVERSAL      There were no vitals filed for this visit.   Subjective Assessment - 01/05/20 1321    Subjective Pt walked back to ST room without cues, with walker.    Patient is accompained by: Family member   [name]   Currently in Pain? No/denies                 ADULT SLP TREATMENT - 01/05/20 1325      General Information   Behavior/Cognition  Alert;Cooperative;Hard of hearing      Treatment Provided   Treatment provided Cognitive-Linquistic      Cognitive-Linquistic Treatment   Treatment focused on Cognition    Skilled Treatment (wife) "He don't like to be asked a lot of questions." Pt hard of hearing so SLP used loud voice with pt and checked occasionally if SLP was loud enough by asking pt if SLP was speaking at a level pt could understand. William Gibson appears more clear-headed than previous session as he is able to hold conversation with SLP about his garden and sequencing questions about what to plant first and what will come in first, etc, with good success. With cues, pt answered questions about timing of of appointments today.  Pt told SLP he would like to get outside tomorrow and work in the yard. SLP asked pt why that might not be advised and pt was vague in his response. SLP explained that pt may not be safe yet to walk predominantly on unstable ground with his walker. Told pt to ask PT about this. Pt did not remember to ask PT about this - PT recommended pt absolutely not attempt working in the yard at this time.      Assessment / Recommendations / Plan   Plan Continue with current plan of care      Progression Toward Goals  Progression toward goals Progressing toward goals            SLP Education - 01/05/20 1803    Education Details Ask PT about working in the yard, SLP does not think PT will recommend    Person(s) Educated Patient;Spouse    Methods Explanation    Comprehension Verbalized understanding;Need further instruction            SLP Short Term Goals - 01/05/20 1805      SLP SHORT TERM GOAL #1   Title pt indicate affirmation of cogntive linguistic deficits in 3 therapy sessions    Time 3    Period Weeks   or 9 total sessions, for all STGs   Status On-going      SLP SHORT TERM GOAL #2   Title pt demonstrate appropriate problem solving skills for functional situations with occasional min A in 3 sessions     Time 3    Period Weeks    Status On-going      SLP SHORT TERM GOAL #3   Title pt will indicate need for memory copmensation techniques x3 sessions    Time 3    Period Weeks    Status On-going      SLP SHORT TERM GOAL #4   Title pt will demonstrate improvement in medication administration by correctly indicating to wife when he needs meds for 5 consecutive days as reported by pt and/or wife    Time 3    Period Weeks    Status On-going            SLP Long Term Goals - 01/05/20 1806      SLP LONG TERM GOAL #1   Title pt will demo evidence of emergent awareness (correcting his errors) on written or verbal tasks in 85% of opportunities in 3 sessions    Time 7    Period Weeks   or 17 total sessions, for all LTGs   Status On-going      SLP LONG TERM GOAL #2   Title pt will appropriately solve problems with finances, medication, etc with modified independence in 3 sessions    Time 7    Period Weeks    Status On-going      SLP LONG TERM GOAL #3   Title pt will administer his own medicine for 7 consecutive days with modified independence    Time 7    Period Weeks    Status On-going      SLP LONG TERM GOAL #4   Title pt will use of a memory compensatoin system successfully between 4 sessions    Time 7    Period Weeks    Status On-going      SLP LONG TERM GOAL #5   Title pt/family will indicate 3 overt s/sx aspiraiton PNA with modifeid independence    Time 7    Period Weeks    Status On-going            Plan - 01/05/20 1804    Clinical Impression Statement Pt presents today with cognitive communication deficits in the areas of (as tested) awareness, memory, and problem solving/executive function. Pt attention/processing appeared better today. Today pt indicated pt is considering working in the yard tomorrow - SLP told pt to ask PT which pt did not (remember to?) do. Pt's deficits are affecting practical activities such as his wife now administers his medicine to him,  and pt is unable to work in the yard. Pt was d/c'd  with 24 hour supervision recommended. SLP recommends and pt was agreeable today to skiled ST x2/week x8 weeks, adjusting the schedule as needed depending on pt progress. Pt was d/c;d from CIR with recommendation of straw sips with water but today looked fine with cup sips x3 (multiple, consecutive sips). SLP recommended to pt/daughter that pt can take liquids via cup, or a straw if desired.    Speech Therapy Frequency 2x / week    Duration --   8 weeks or 17 total visits   Treatment/Interventions Diet toleration management by SLP;Trials of upgraded texture/liquids;Cueing hierarchy;Cognitive reorganization;Internal/external aids;Patient/family education;Compensatory strategies;SLP instruction and feedback;Functional tasks;Environmental controls    Potential to Achieve Goals Good    Potential Considerations Cooperation/participation level    Consulted and Agree with Plan of Care Patient           Patient will benefit from skilled therapeutic intervention in order to improve the following deficits and impairments:   Cognitive communication deficit  Dysphagia, oropharyngeal phase    Problem List Patient Active Problem List   Diagnosis Date Noted  . Vascular headache   . TBI (traumatic brain injury) ([place])   . Alcohol-induced chronic pancreatitis ([place])   . Dysphagia   . Fall   . Hypomagnesemia   . Hyponatremia   . Delirium tremens ([place])   . Subarachnoid hemorrhage ([place]) 11/04/2019  . Skull fracture ([place]) 11/04/2019  . Head injury 11/04/2019  . Fall at home, initial encounter 11/04/2019  . Loss of consciousness ([place]) 11/04/2019  . Hyperlipidemia 02/06/2019  . Shuffling gait 12/19/2018  . Abnormal liver function tests 12/15/2018  . Alcoholic liver disease ([place]) 12/15/2018  . RBBB 12/15/2018  . 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 12/15/2018  . Hyperammonemia ([place]) 11/15/2018  . Weight loss, unintentional 11/09/2018  .  Frequent falls 11/09/2018  . RA (rheumatoid arthritis) ([place]) 08/17/2017  . Memory change 08/17/2017  . Alcoholism ([place]) 12/15/2016  . Osteoarthritis 01/30/2016  . GERD (gastroesophageal reflux disease) 05/23/2014  . Elevated LDL cholesterol level 05/22/2013  . Erectile dysfunction 05/22/2013  . Tobacco abuse 04/26/2012    [facility] ,[place], [place]  01/05/2020, 6:07 PM  [facility] [address] [place], [place], [address] Phone: [phone]   Fax:  [phone]   Name: William Gibson MRN: [id number] Date of Birth: Sep 28, 1945

## 2020-01-09 ENCOUNTER — Other Ambulatory Visit: Payer: Self-pay

## 2020-01-09 ENCOUNTER — Ambulatory Visit: Payer: 59 | Admitting: Speech Pathology

## 2020-01-09 ENCOUNTER — Ambulatory Visit: Payer: 59

## 2020-01-09 VITALS — BP 122/79 | HR 82

## 2020-01-09 DIAGNOSIS — R2689 Other abnormalities of gait and mobility: Secondary | ICD-10-CM

## 2020-01-09 DIAGNOSIS — R296 Repeated falls: Secondary | ICD-10-CM

## 2020-01-09 DIAGNOSIS — R41841 Cognitive communication deficit: Secondary | ICD-10-CM | POA: Diagnosis not present

## 2020-01-09 DIAGNOSIS — R2681 Unsteadiness on feet: Secondary | ICD-10-CM

## 2020-01-09 DIAGNOSIS — M6281 Muscle weakness (generalized): Secondary | ICD-10-CM

## 2020-01-09 NOTE — Therapy (Signed)
[facility] [address] [place], [place], [address] Phone: [phone]   Fax:  [phone]  Speech Language Pathology Treatment  Patient Details  Name: William Gibson MRN: [id number] Date of Birth: 08/03/1945 Referring Provider (SLP): Reesa Chew, [place]   Encounter Date: 01/09/2020   End of Session - 01/09/20 1722    Visit Number 4    Number of Visits 17    Date for SLP Re-Evaluation 03/08/20    SLP Start Time 1622    SLP Stop Time  5400    SLP Time Calculation (min) 36 min    Activity Tolerance Patient tolerated treatment well           Past Medical History:  Diagnosis Date  . Abnormal liver function tests 12/15/2018  . Alcoholic liver disease ([place]) 12/15/2018  . Alcoholism ([place])   . Elevated LDL cholesterol level 05/22/2013  . Epistaxis   . Erectile dysfunction 05/22/2013  . Frequent falls 11/09/2018  . GERD (gastroesophageal reflux disease)   . Hyperammonemia ([place]) 11/15/2018  . Memory change 08/17/2017  . RA (rheumatoid arthritis) ([place]) 08/17/2017  . RBBB 12/15/2018  . Rheumatoid arthritis ([place]) 2010  . Shuffling gait 12/19/2018  . Tobacco abuse 04/26/2012  . Weight loss, unintentional 11/09/2018  . 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 12/15/2018    Past Surgical History:  Procedure Laterality Date  . NASAL ENDOSCOPY WITH EPISTAXIS CONTROL N/A 04/12/2018   Procedure: NASAL ENDOSCOPY WITH EPISTAXIS CONTROL;  Surgeon: Melissa Montane, MD;  Location: [facility];  Service: ENT;  Laterality: N/A;  . VASECTOMY    . VASECTOMY REVERSAL      There were no vitals filed for this visit.   Subjective Assessment - 01/09/20 1627    Subjective "No" re: whether homework or not    Patient is accompained by: Family member    Currently in Pain? No/denies                 ADULT SLP TREATMENT - 01/09/20 1717      General Information   Behavior/Cognition Alert;Cooperative;Hard of hearing      Treatment  Provided   Treatment provided Cognitive-Linquistic      Pain Assessment   Pain Assessment No/denies pain      Cognitive-Linquistic Treatment   Treatment focused on Cognition    Skilled Treatment Patient reports PT told him he should not work in the yard but disagrees with this. He stated he doesn't think he should have to pay someone to mow or clean the pool. SLP cont to educate pt on deficits and how this may impact his safety. Daughter present and agreed with/reinforced this to pt. He denies difficulties doing anything aside from outdoor activities. SLP used simple cognitive linguistic tasks targeting attention and awareness, with pt requiring cues for awareness of errors. Provided printout of pt's therapy calendar; he required usual mod-max cues to reference/ use this in response to simple questions.       Progression Toward Goals   Progression toward goals Not progressing toward goals (comment)   severity of deficits, awareness             SLP Short Term Goals - 01/09/20 1723      SLP SHORT TERM GOAL #1   Title pt indicate affirmation of cogntive linguistic deficits in 3 therapy sessions    Time 2    Period Weeks   or 9 total sessions, for all STGs   Status On-going  SLP SHORT TERM GOAL #2   Title pt demonstrate appropriate problem solving skills for functional situations with occasional min A in 3 sessions    Time 2    Period Weeks    Status On-going      SLP SHORT TERM GOAL #3   Title pt will indicate need for memory copmensation techniques x3 sessions    Time 2    Period Weeks    Status On-going      SLP SHORT TERM GOAL #4   Title pt will demonstrate improvement in medication administration by correctly indicating to wife when he needs meds for 5 consecutive days as reported by pt and/or wife    Time 2    Period Weeks    Status On-going            SLP Long Term Goals - 01/09/20 1723      SLP LONG TERM GOAL #1   Title pt will demo evidence of emergent  awareness (correcting his errors) on written or verbal tasks in 85% of opportunities in 3 sessions    Time 6    Period Weeks   or 17 total sessions, for all LTGs   Status On-going      SLP LONG TERM GOAL #2   Title pt will appropriately solve problems with finances, medication, etc with modified independence in 3 sessions    Time 6    Period Weeks    Status On-going      SLP LONG TERM GOAL #3   Title pt will administer his own medicine for 7 consecutive days with modified independence    Time 6    Period Weeks    Status On-going      SLP LONG TERM GOAL #4   Title pt will use of a memory compensatoin system successfully between 4 sessions    Time 6    Period Weeks    Status On-going      SLP LONG TERM GOAL #5   Title pt/family will indicate 3 overt s/sx aspiraiton PNA with modifeid independence    Time 6    Period Weeks    Status On-going            Plan - 01/09/20 1722    Clinical Impression Statement Pt presents today with cognitive communication deficits in the areas of (as tested) awareness, memory, and problem solving/executive function. Pt attention/processing appeared better today. Today pt indicated pt is considering working in the yard tomorrow - SLP told pt to ask PT which pt did not (remember to?) do. Pt's deficits are affecting practical activities such as his wife now administers his medicine to him, and pt is unable to work in the yard. Pt was d/c'd with 24 hour supervision recommended. SLP recommends and pt was agreeable today to skiled ST x2/week x8 weeks, adjusting the schedule as needed depending on pt progress. Pt was d/c;d from CIR with recommendation of straw sips with water but today looked fine with cup sips x3 (multiple, consecutive sips). SLP recommended to pt/daughter that pt can take liquids via cup, or a straw if desired.    Speech Therapy Frequency 2x / week    Duration --   8 weeks or 17 total visits   Treatment/Interventions Diet toleration  management by SLP;Trials of upgraded texture/liquids;Cueing hierarchy;Cognitive reorganization;Internal/external aids;Patient/family education;Compensatory strategies;SLP instruction and feedback;Functional tasks;Environmental controls    Potential to Achieve Goals Good    Potential Considerations Cooperation/participation level    Consulted and Agree  with Plan of Care Patient           Patient will benefit from skilled therapeutic intervention in order to improve the following deficits and impairments:   Cognitive communication deficit    Problem List Patient Active Problem List   Diagnosis Date Noted  . Vascular headache   . TBI (traumatic brain injury) ([place])   . Alcohol-induced chronic pancreatitis ([place])   . Dysphagia   . Fall   . Hypomagnesemia   . Hyponatremia   . Delirium tremens ([place])   . Subarachnoid hemorrhage ([place]) 11/04/2019  . Skull fracture ([place]) 11/04/2019  . Head injury 11/04/2019  . Fall at home, initial encounter 11/04/2019  . Loss of consciousness ([place]) 11/04/2019  . Hyperlipidemia 02/06/2019  . Shuffling gait 12/19/2018  . Abnormal liver function tests 12/15/2018  . Alcoholic liver disease ([place]) 12/15/2018  . RBBB 12/15/2018  . 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 12/15/2018  . Hyperammonemia ([place]) 11/15/2018  . Weight loss, unintentional 11/09/2018  . Frequent falls 11/09/2018  . RA (rheumatoid arthritis) ([place]) 08/17/2017  . Memory change 08/17/2017  . Alcoholism ([place]) 12/15/2016  . Osteoarthritis 01/30/2016  . GERD (gastroesophageal reflux disease) 05/23/2014  . Elevated LDL cholesterol level 05/22/2013  . Erectile dysfunction 05/22/2013  . Tobacco abuse 04/26/2012   Deneise Lever, [place], [place] Speech-Language Pathologist  Aliene Altes 01/09/2020, 5:24 PM  [facility] [address] [place], [place], [address] Phone: [phone]   Fax:  [phone]   Name: William Gibson MRN: [id number] Date of Birth: [birth year]

## 2020-01-09 NOTE — Therapy (Signed)
[facility] [address] [place], [place], [address] Phone: [phone]   Fax:  [phone]  Physical Therapy Treatment  Patient Details  Name: William Gibson MRN: [id number] Date of Birth: 1945-11-09 Referring Provider (PT): Reesa Chew   Encounter Date: 01/09/2020   PT End of Session - 01/09/20 1534    Visit Number 5    Number of Visits 17    Date for PT Re-Evaluation 40/81/44   90 day cert but 60 day poc   Authorization Type [employer] and med A secondary so 10th visit progress note    PT Start Time 1531    PT Stop Time 1614    PT Time Calculation (min) 43 min    Equipment Utilized During Treatment Gait belt    Activity Tolerance Patient tolerated treatment well    Behavior During Therapy [facility] for tasks assessed/performed           Past Medical History:  Diagnosis Date  . Abnormal liver function tests 12/15/2018  . Alcoholic liver disease ([place]) 12/15/2018  . Alcoholism ([place])   . Elevated LDL cholesterol level 05/22/2013  . Epistaxis   . Erectile dysfunction 05/22/2013  . Frequent falls 11/09/2018  . GERD (gastroesophageal reflux disease)   . Hyperammonemia ([place]) 11/15/2018  . Memory change 08/17/2017  . RA (rheumatoid arthritis) ([place]) 08/17/2017  . RBBB 12/15/2018  . Rheumatoid arthritis ([place]) 2010  . Shuffling gait 12/19/2018  . Tobacco abuse 04/26/2012  . Weight loss, unintentional 11/09/2018  . 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 12/15/2018    Past Surgical History:  Procedure Laterality Date  . NASAL ENDOSCOPY WITH EPISTAXIS CONTROL N/A 04/12/2018   Procedure: NASAL ENDOSCOPY WITH EPISTAXIS CONTROL;  Surgeon: Melissa Montane, MD;  Location: [facility];  Service: ENT;  Laterality: N/A;  . VASECTOMY    . VASECTOMY REVERSAL      Vitals:   01/09/20 1552  BP: 122/79  Pulse: 82  SpO2: 98%     Subjective Assessment - 01/09/20 1533    Subjective Patient reports no falls or new complaints over the weekend.  Reports has MD appointment tomorrow. No pain.    Patient is accompained by: Family member   wife   Pertinent History RA, HTN, fatty liver due to EtOH abuse, gait disorder, report of increased falls x1 week    Patient Stated Goals Pt wants to gain some weight back to get stronger.    Currently in Pain? No/denies    Pain Onset More than a month ago                             [facility] Adult PT Treatment/Exercise - 01/09/20 0001      Transfers   Transfers Sit to Stand;Stand to Sit    Sit to Stand 5: Supervision    Five time sit to stand comments  11.50 secs w/o UE support from chair    Stand to Sit 5: Supervision      Ambulation/Gait   Ambulation/Gait Yes    Ambulation/Gait Assistance 5: Supervision;4: Min guard    Ambulation/Gait Assistance Details PT completed ambulation with patient without AD. Patient able to ambulate 620 ft, Pt require 1-2 instances of CGA from PT and verbal cues to slow pace due to increased cadence and small shuffled steps. Patient able to self correct with verbal cues.     Ambulation Distance (Feet) 620 Feet    Assistive device  None    Gait Pattern Step-through pattern;Decreased step length - right;Decreased step length - left    Ambulation Surface Level;Indoor      Standardized Balance Assessment   Standardized Balance Assessment Berg Balance Test      Berg Balance Test   Sit to Stand Able to stand without using hands and stabilize independently    Standing Unsupported Able to stand safely 2 minutes    Sitting with Back Unsupported but Feet Supported on Floor or Stool Able to sit safely and securely 2 minutes    Stand to Sit Sits safely with minimal use of hands    Transfers Able to transfer safely, minor use of hands    Standing Unsupported with Eyes Closed Able to stand 10 seconds with supervision    Standing Ubsupported with Feet Together Able to place feet together independently and stand for 1 minute with supervision    From Standing,  Reach Forward with Outstretched Arm Can reach confidently >25 cm (10")    From Standing Position, Pick up Object from Floor Able to pick up shoe, needs supervision    From Standing Position, Turn to Look Behind Over each Shoulder Looks behind from both sides and weight shifts well    Turn 360 Degrees Able to turn 360 degrees safely one side only in 4 seconds or less    Standing Unsupported, Alternately Place Feet on Step/Stool Able to complete 4 steps without aid or supervision    Standing Unsupported, One Foot in Front Able to plae foot ahead of the other independently and hold 30 seconds    Standing on One Leg Tries to lift leg/unable to hold 3 seconds but remains standing independently    Total Score 46               Balance Exercises - 01/09/20 0001      Balance Exercises: Standing   Stepping Strategy Anterior;Foam/compliant surface;Limitations    Stepping Strategy Limitations completed anterior stepping strategy alernating BLE, completed x 10 reps bilaterally without UE support. CGA as required.     Balance Beam Completed static standing across balance beam, initially with EO, 2 x 1 min. Progressed to completing with eyes closed 3 x 10 seconds. Patient unable to hold EC long duration before LOB requiring intermittent UE support or CGA from PT.     Tandem Gait Forward;Intermittent upper extremity support;3 reps;Limitations    Tandem Gait Limitations completed in // bars with light UE support as needed. PT providing verbal cues for improved completion as patient demo increased speed with completion often unable to correct when LOB. Require CGA as needed.              PT Education - 01/09/20 1749    Education Details Educated on progress toward [employer]) Educated Patient;Child(ren)    Methods Explanation    Comprehension Verbalized understanding            PT Short Term Goals - 01/09/20 1535      PT SHORT TERM GOAL #1   Title Pt will be able to perform initial HEP  with supervision of family for strengthening and balance.    Baseline Patient reports compliance/independence with HEP    Time 4    Period Weeks    Status Achieved    Target Date 01/10/20      PT SHORT TERM GOAL #2   Title Pt will increase Berg from 43/56 to >47/56 for improved balance and decreased fall  risk.    Baseline 43/56 on 12/11/19, 46/56 on 9/14    Time 4    Period Weeks    Status Not Met    Target Date 01/10/20      PT SHORT TERM GOAL #3   Title Pt will ambulate >500' on level surfaces without AD independently for improved household mobility and short community distances.    Baseline 620' without AD, intermittent CGA/supervision    Time 4    Period Weeks    Status Achieved    Target Date 01/10/20      PT SHORT TERM GOAL #4   Title Pt will decreased 5 x sit to stand from 13.51 sec to <11 sec for improved functional strength and balance.    Baseline 13.51 sec from chair without hands on 12/11/19, 11.50 secs w/o UE support    Time 4    Period Weeks    Status Not Met    Target Date 01/10/20      PT SHORT TERM GOAL #5   Title FGA will be assessed and LTG written.    Baseline FGA performed on 12/29/19 with 13/30    Time 4    Period Weeks    Status Achieved    Target Date 01/10/20             PT Long Term Goals - 12/29/19 1550      PT LONG TERM GOAL #1   Title Pt will be independent with progressive HEP for balance and strengthening to continue gains on own.    Time 8    Period Weeks    Status New      PT LONG TERM GOAL #2   Title Pt will ambulate >1000' on varied surfaces independently for improved community mobility.    Time 8    Period Weeks    Status New      PT LONG TERM GOAL #3   Title Pt will increase gait speed from 0.25m/s to >1.55m/s for improved gait safety.    Baseline 0.49m/s on 12/11/19 without AD    Time 8    Period Weeks    Status New      PT LONG TERM GOAL #4   Title Pt will increase FGA from 13/30 to >19/30 for improved gait safety and  decreased fall risk.    Baseline 13/30 on 12/29/19    Time 8    Period Weeks    Status New                 Plan - 01/09/20 1616    Clinical Impression Statement Today's skilled PT session included assesment of patient's progress toward STG. Patient able to meet STG #1, 3, and 5 today. Patient unable to meet Berg Balance goal and 5x sit <> stand goal but did demonstrate progress toward these goals today. Patient will continue to benefit from skilled PT services to progress toward all unmet goals.    Personal Factors and Comorbidities Comorbidity 3+    Comorbidities RA, HTN, fatty liver due to EtOH abuse, gait disorder, report of increased fall    Examination-Activity Limitations Stand;Locomotion Level;Transfers    Examination-Participation Restrictions Community Activity;Yard Work;Medication Management    Stability/Clinical Decision Making Evolving/Moderate complexity    Rehab Potential Good    PT Frequency 2x / week    PT Duration 8 weeks    PT Treatment/Interventions ADLs/Self Care Home Management;DME Instruction;Gait training;Stair training;Functional mobility training;Therapeutic activities;Therapeutic exercise;Balance training;Patient/family education;Neuromuscular re-education;Vestibular;Passive range of motion  PT Next Visit Plan How was HEP addition? Gait training without walker, balance training. Pt is hard of hearing. Was a little slower with responses at visit today and ST reaching out to MD as was mention of possible shunt need in chart.    Consulted and Agree with Plan of Care Patient;Family member/caregiver    Family Member Consulted wife           Patient will benefit from skilled therapeutic intervention in order to improve the following deficits and impairments:  Abnormal gait, Decreased cognition, Decreased balance, Decreased activity tolerance, Decreased strength, Decreased mobility, Decreased knowledge of use of DME, Decreased endurance  Visit Diagnosis: Other  abnormalities of gait and mobility  Muscle weakness (generalized)  Unsteadiness on feet  Frequent falls     Problem List Patient Active Problem List   Diagnosis Date Noted  . Vascular headache   . TBI (traumatic brain injury) ([place])   . Alcohol-induced chronic pancreatitis ([place])   . Dysphagia   . Fall   . Hypomagnesemia   . Hyponatremia   . Delirium tremens ([place])   . Subarachnoid hemorrhage ([place]) 11/04/2019  . Skull fracture ([place]) 11/04/2019  . Head injury 11/04/2019  . Fall at home, initial encounter 11/04/2019  . Loss of consciousness ([place]) 11/04/2019  . Hyperlipidemia 02/06/2019  . Shuffling gait 12/19/2018  . Abnormal liver function tests 12/15/2018  . Alcoholic liver disease ([place]) 12/15/2018  . RBBB 12/15/2018  . 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 12/15/2018  . Hyperammonemia ([place]) 11/15/2018  . Weight loss, unintentional 11/09/2018  . Frequent falls 11/09/2018  . RA (rheumatoid arthritis) ([place]) 08/17/2017  . Memory change 08/17/2017  . Alcoholism ([place]) 12/15/2016  . Osteoarthritis 01/30/2016  . GERD (gastroesophageal reflux disease) 05/23/2014  . Elevated LDL cholesterol level 05/22/2013  . Erectile dysfunction 05/22/2013  . Tobacco abuse 04/26/2012    Jones Bales, PT, DPT 01/09/2020, 5:50 PM  [facility] [address] [place], [place], [address] Phone: [phone]   Fax:  [phone]  Name: William Gibson MRN: [id number] Date of Birth: 1946/03/20

## 2020-01-10 DIAGNOSIS — R030 Elevated blood-pressure reading, without diagnosis of hypertension: Secondary | ICD-10-CM | POA: Insufficient documentation

## 2020-01-10 DIAGNOSIS — S065XAA Traumatic subdural hemorrhage with loss of consciousness status unknown, initial encounter: Secondary | ICD-10-CM | POA: Insufficient documentation

## 2020-01-11 NOTE — Patient Instructions (Addendum)
Health Maintenance Due  Topic Date Due  . COVID-19 Vaccine (1) Will call back with dates of vaccines. Never done  . TETANUS/TDAP - consider at your pharmacy (cheaper there) Never done  . Pneumovax 23- next visit Never done   High dose flu shot today  Immunization History  Administered Date(s) Administered  . Fluad Quad(high Dose 65+) 04/18/2019     We will call you within two weeks about your referral to lung cancer screening. If you do not hear within 3 weeks, give [place] a call.   Glad you are doing reasonably well  [place] job staying off alcohol! Please consider quitting smoking as next step  Please stop by lab before you go If you have mychart- we will send your results within 3 business days of [place] receiving them.  If you do not have mychart- we will call you about results within 5 business days of [place] receiving them.  *please note we are currently using [facility] which has a longer processing time than [place] typically so labs may not come back as quickly as in the past *please also note that you will see labs on mychart as soon as they post. I will later go in and write notes on them- will say "notes from Dr. Yong Channel"   With combo of psychiatric meds potential risk for   Serotonin Syndrome Serotonin is a chemical in your body (neurotransmitter) that helps to control several functions, such as:  Brain and nerve cell function.  Mood and emotions.  Memory.  Eating.  Sleeping.  Sexual activity.  Stress response. Having too much serotonin in your body can cause serotonin syndrome. This condition can be harmful to your brain and nerve cells. This can be a life-threatening condition. What are the causes? This condition may be caused by taking medicines or drugs that increase the level of serotonin in your body, such as:  Antidepressant medicines.  Migraine medicines.  Certain pain medicines.  Certain drugs, including ecstasy, LSD, cocaine, and  amphetamines.  Over-the-counter cough or cold medicines that contain dextromethorphan.  Certain herbal supplements, including St. John's wort, ginseng, and nutmeg. This condition usually occurs when you take these medicines or drugs in combination, but it can also happen with a high dose of a single medicine or drug. What increases the risk? You are more likely to develop this condition if:  You just started taking a medicine or drug that increases the level of serotonin in the body.  You recently increased the dose of a medicine or drug that increases the level of serotonin in the body.  You take more than one medicine or drug that increases the level of serotonin in the body. What are the signs or symptoms? Symptoms of this condition usually start within several hours of taking a medicine or drug. Symptoms may be mild or severe. Mild symptoms include:  Sweating.  Restlessness or agitation.  Muscle twitching or stiffness.  Rapid heart rate.  Nausea and vomiting.  Diarrhea.  Headache.  Shivering or goose bumps.  Confusion. Severe symptoms include:  Irregular heartbeat.  Seizures.  Loss of consciousness.  High fever. How is this diagnosed? This condition may be diagnosed based on:  Your medical history.  A physical exam.  Your prior use of drugs and medicines.  Blood or urine tests. These may be used to rule out other causes of your symptoms. How is this treated? The treatment for this condition depends on the severity of your symptoms.  For mild  cases, stopping the medicine or drug that caused your condition is usually all that is needed.  For moderate to severe cases, treatment in a hospital may be needed to prevent or manage life-threatening symptoms. This may include medicines to control your symptoms, IV fluids, interventions to support your breathing, and treatments to control your body temperature. Follow these instructions at  home: Medicines   Take over-the-counter and prescription medicines only as told by your health care provider. This is important.  Check with your health care provider before you start taking any new prescriptions, over-the-counter medicines, herbs, or supplements.  Avoid combining any medicines that can cause this condition to occur. Lifestyle   Maintain a healthy lifestyle. ? Eat a healthy diet that includes plenty of vegetables, fruits, whole grains, low-fat dairy products, and lean protein. Do not eat a lot of foods that are high in fat, added sugars, or salt. ? Get the right amount and quality of sleep. Most adults need 7-9 hours of sleep each night. ? Make time to exercise, even if it is only for short periods of time. Most adults should exercise for at least 150 minutes each week. ? Do not drink alcohol. ? Do not use illegal drugs, and do not take medicines for reasons other than they are prescribed. General instructions  Do not use any products that contain nicotine or tobacco, such as cigarettes and e-cigarettes. If you need help quitting, ask your health care provider.  Keep all follow-up visits as told by your health care provider. This is important. Contact a health care provider if:  Your symptoms do not improve or they get worse. Get help right away if you:  Have worsening confusion, severe headache, chest pain, high fever, seizures, or loss of consciousness.  Experience serious side effects of medicine, such as swelling of your face, lips, tongue, or throat.  Have serious thoughts about hurting yourself or others. These symptoms may represent a serious problem that is an emergency. Do not wait to see if the symptoms will go away. Get medical help right away. Call your local emergency services (911 in the [place] Do not drive yourself to the hospital. If you ever feel like you may hurt yourself or others, or have thoughts about taking your own life, get help right away.  You can go to your nearest emergency department or call:  Your local emergency services (911 in the [place]  A suicide crisis helpline, such as the [place] at (220) 352-4951. This is open 24 hours a day. Summary  Serotonin is a brain chemical that helps to regulate the nervous system. High levels of serotonin in the body can cause serotonin syndrome, which is a very dangerous condition.  This condition may be caused by taking medicines or drugs that increase the level of serotonin in your body.  Treatment depends on the severity of your symptoms. For mild cases, stopping the medicine or drug that caused your condition is usually all that is needed.  Check with your health care provider before you start taking any new prescriptions, over-the-counter medicines, herbs, or supplements. This information is not intended to replace advice given to you by your health care provider. Make sure you discuss any questions you have with your health care provider. Document Revised: 05/21/2017 Document Reviewed: 05/21/2017 Elsevier Patient Education  2020 [employer].

## 2020-01-11 NOTE — Progress Notes (Signed)
Phone: [phone]   Subjective:  Patient presents today to establish care.  Prior patient of Dr. Marjory Lies.  Chief Complaint  Patient presents with  . Transitions Of Care   See problem oriented charting  The following were reviewed and entered/updated in epic: Past Medical History:  Diagnosis Date  . Abnormal liver function tests 12/15/2018  . Alcoholic liver disease ([place]) 12/15/2018  . Alcoholism ([place])   . Elevated LDL cholesterol level 05/22/2013  . Epistaxis   . Erectile dysfunction 05/22/2013  . Frequent falls 11/09/2018  . GERD (gastroesophageal reflux disease)   . Hyperammonemia ([place]) 11/15/2018  . Memory change 08/17/2017  . RA (rheumatoid arthritis) ([place]) 08/17/2017  . RBBB 12/15/2018  . Rheumatoid arthritis ([place]) 2010  . Shuffling gait 12/19/2018  . Tobacco abuse 04/26/2012  . Weight loss, unintentional 11/09/2018  . 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 12/15/2018   Patient Active Problem List   Diagnosis Date Noted  . History of subarachnoid hemorrhage 11/04/2019    Priority: High  . Head injury 11/04/2019    Priority: High  . Alcoholic liver disease ([place]) 12/15/2018    Priority: High  . 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 12/15/2018    Priority: High  . RA (rheumatoid arthritis) ([place]) 08/17/2017    Priority: High  . Alcohol dependence in early full remission ([place]) 12/15/2016    Priority: High  . Tobacco abuse 04/26/2012    Priority: High  . Major depression in full remission ([place]) 01/12/2020    Priority: Medium  . Insomnia 01/12/2020    Priority: Medium  . Vascular headache     Priority: Medium  . Hyperlipidemia 02/06/2019    Priority: Medium  . Shuffling gait 12/19/2018    Priority: Medium  . Frequent falls 11/09/2018    Priority: Medium  . GERD (gastroesophageal reflux disease) 05/23/2014    Priority: Medium  . History of pancreatitis 01/12/2020    Priority: Low  . Hyponatremia     Priority: Low  . RBBB 12/15/2018    Priority: Low    . Hyperammonemia ([place]) 11/15/2018    Priority: Low  . Memory change 08/17/2017    Priority: Low  . Osteoarthritis 01/30/2016    Priority: Low  . Erectile dysfunction 05/22/2013    Priority: Low   Past Surgical History:  Procedure Laterality Date  . NASAL ENDOSCOPY WITH EPISTAXIS CONTROL N/A 04/12/2018   Procedure: NASAL ENDOSCOPY WITH EPISTAXIS CONTROL;  Surgeon: Melissa Montane, MD;  Location: [facility];  Service: ENT;  Laterality: N/A;  . VASECTOMY    . VASECTOMY REVERSAL      Family History  Problem Relation Age of Onset  . Lung cancer Father   . Heart disease Mother   . CAD Brother   . Aneurysm Brother   . Colon cancer Neg Hx   . Esophageal cancer Neg Hx   . Stomach cancer Neg Hx     Medications- reviewed and updated Current Outpatient Medications  Medication Sig Dispense Refill  . Cyanocobalamin (VITAMIN B-12 PO) Take 1 tablet by mouth daily.    Marland Kitchen escitalopram (LEXAPRO) 10 MG tablet Take 1 tablet (10 mg total) by mouth daily. 90 tablet 3  . folic acid (FOLVITE) 1 MG tablet Take 1 tablet (1 mg total) by mouth daily. 30 tablet 0  . mirtazapine (REMERON) 15 MG tablet Take 1 tablet (15 mg total) by mouth at bedtime. 90 tablet 3  . omeprazole (PRILOSEC) 20 MG capsule Take 20 mg by mouth  daily. OTC- transitioning from protonix 40mg     . thiamine (VITAMIN B-1) 100 MG tablet Take 1 tablet (100 mg total) by mouth daily. 30 tablet 0  . traZODone (DESYREL) 50 MG tablet Take 1 tablet (50 mg total) by mouth at bedtime. 90 tablet 3   No current facility-administered medications for this visit.    Allergies-reviewed and updated No Known Allergies  Social History   Social History Narrative   Married. 3 children. 8 grandkids. 1 greatgrandkid.       Retired from being self employed Psychologist, counselling         Does not have a living will.   Desires CPR, does not want prolonged life support if futile.    Objective  Objective:  BP 104/70   Pulse 87   Temp 98.6 F (37 C)  (Temporal)   Ht 5\' 8"  (1.727 m)   Wt 148 lb 9.6 oz (67.4 kg)   SpO2 98%   BMI 22.59 kg/m  Gen: NAD, resting comfortably CV: RRR no murmurs rubs or gallops Lungs: CTAB no crackles, wheeze, rhonchi Ext: no edema    Assessment and Plan:   #Alcoholism in remission-patient has not had a drink since November 03, 2019-major fall with head trauma/skull fracture/subarachnoid hemorrhage.  His health is much improved since stopping drinking-congratulated on progress and encouraged continued cessation  # Depression/insomnia S: Medication:lexapro 10 mg and remeron 15mg  . Also on trazodone for sleep- really helps  Feels like depression is well controlled Depression screen [facility] 2/9 12/06/2019 11/16/2018 06/01/2017  Decreased Interest 0 0 0  Down, Depressed, Hopeless 0 0 0  PHQ - 2 Score 0 0 0  Altered sleeping 3 - -  Tired, decreased energy 1 - -  Change in appetite 0 - -  Feeling bad or failure about yourself  0 - -  Trouble concentrating 1 - -  Moving slowly or fidgety/restless 1 - -  Suicidal thoughts 0 - -  PHQ-9 Score 6 - -  A/P: doing well and appetite better on remeron- we opted to continue both medicines  # GERD S:Medication: protonix 40mg   B12 levels related to PPI use: on oral b12 Lab Results  Component Value Date   VITAMINB12 1,185 (H) 11/07/2019   A/P: plans to switch back to OTC prilosec- that is reasonable as long as no worsening reflux   # Rheumatoid arthritis S:plasn to  Follow up with rheumatology about restarting meloxicam. Thankfully no worsening symptoms off of meloxicam recently. Reports last visit over a year ago- last Dr. Trudie Reed . Also has been on prednisone in past A/P: Interestingly not on any therapy for rheumatoid arthritis and appears to be largely symptom-free-this seems rather atypical and I encouraged follow-up with Dr. Trudie Reed  #hyperlipidemia S: Medication: None Lab Results  Component Value Date   CHOL 257 (H) 05/19/2018   HDL 65.10 05/19/2018   LDLCALC 170  (H) 05/19/2018   LDLDIRECT 143.8 05/15/2013   TRIG 109.0 05/19/2018   CHOLHDL 4 05/19/2018   A/P: 10-year ASCVD risk over 15%-need to strongly consider statin.  I suspect there has been some hesitancy with prior alcohol use and her disease-last LFTs were reassuring.  If reassuring today would consider statin   #Reports history of shuffling gait/frequent falls-Working with PT on shuffling gait.  There has not been prior mention of Parkinson's/parkinsonian features-thought related to alcohol use.  Seems to be improved off alcohol  #Cold natured-we will check TSH and CBC  #Reported vascular headaches- doing well off of  topiramate 50mg  BID   Recommended follow up: Return in about 3 months (around 04/12/2020) for follow up- or sooner if needed. Future Appointments  Date Time Provider [place]  01/15/2020  2:00 PM Electa Sniff, PT [facility]  01/17/2020  4:15 PM Electa Sniff, PT [facility] [place]  01/22/2020  2:00 PM Electa Sniff, PT [facility]  01/24/2020  4:15 PM Electa Sniff, PT [facility]  01/26/2020  1:00 PM Raulkar, Clide Deutscher, MD [facility]  01/29/2020  2:45 PM Jones Bales, PT [facility]  01/31/2020  4:15 PM Electa Sniff, PT [facility]  02/05/2020  2:45 PM Jones Bales, PT [facility]  02/07/2020  4:15 PM Electa Sniff, PT [facility]  02/12/2020  2:45 PM Jones Bales, PT [facility]  02/14/2020  4:15 PM Electa Sniff, PT [facility]  02/19/2020  2:45 PM Jones Bales, PT [facility]  02/21/2020  4:15 PM Electa Sniff, PT [facility]  04/12/2020  4:00 PM Yong Channel Brayton Mars, MD [facility]    Meds ordered this encounter  Medications  . DISCONTD: traZODone (DESYREL) 50 MG tablet    Sig: Take 1 tablet (50 mg total) by mouth at bedtime.    Dispense:  30 tablet    Refill:  5  . DISCONTD: mirtazapine (REMERON) 15 MG tablet    Sig: Take 1 tablet (15 mg total) by mouth at bedtime.    Dispense:  30 tablet     Refill:  3  . escitalopram (LEXAPRO) 10 MG tablet    Sig: Take 1 tablet (10 mg total) by mouth daily.    Dispense:  90 tablet    Refill:  3  . mirtazapine (REMERON) 15 MG tablet    Sig: Take 1 tablet (15 mg total) by mouth at bedtime.    Dispense:  90 tablet    Refill:  3  . traZODone (DESYREL) 50 MG tablet    Sig: Take 1 tablet (50 mg total) by mouth at bedtime.    Dispense:  90 tablet    Refill:  3    Time Spent: 51 minutes of total time (2:46 PM- 3:37 PM) was spent on the date of the encounter performing the following actions: chart review prior to seeing the patient, obtaining history, performing a medically necessary exam, counseling on the treatment plan, placing orders, and documenting in our EHR.   Return precautions advised. Garret Reddish, MD

## 2020-01-12 ENCOUNTER — Encounter: Payer: Self-pay | Admitting: Family Medicine

## 2020-01-12 ENCOUNTER — Ambulatory Visit (INDEPENDENT_AMBULATORY_CARE_PROVIDER_SITE_OTHER): Payer: 59 | Admitting: Family Medicine

## 2020-01-12 ENCOUNTER — Other Ambulatory Visit: Payer: Self-pay

## 2020-01-12 VITALS — BP 104/70 | HR 87 | Temp 98.6°F | Ht 68.0 in | Wt 148.6 lb

## 2020-01-12 DIAGNOSIS — K219 Gastro-esophageal reflux disease without esophagitis: Secondary | ICD-10-CM | POA: Diagnosis not present

## 2020-01-12 DIAGNOSIS — G4700 Insomnia, unspecified: Secondary | ICD-10-CM

## 2020-01-12 DIAGNOSIS — F17200 Nicotine dependence, unspecified, uncomplicated: Secondary | ICD-10-CM

## 2020-01-12 DIAGNOSIS — Z8679 Personal history of other diseases of the circulatory system: Secondary | ICD-10-CM

## 2020-01-12 DIAGNOSIS — F325 Major depressive disorder, single episode, in full remission: Secondary | ICD-10-CM | POA: Diagnosis not present

## 2020-01-12 DIAGNOSIS — F1021 Alcohol dependence, in remission: Secondary | ICD-10-CM

## 2020-01-12 DIAGNOSIS — Z8719 Personal history of other diseases of the digestive system: Secondary | ICD-10-CM | POA: Insufficient documentation

## 2020-01-12 DIAGNOSIS — Z23 Encounter for immunization: Secondary | ICD-10-CM | POA: Diagnosis not present

## 2020-01-12 DIAGNOSIS — M069 Rheumatoid arthritis, unspecified: Secondary | ICD-10-CM

## 2020-01-12 DIAGNOSIS — E782 Mixed hyperlipidemia: Secondary | ICD-10-CM

## 2020-01-12 MED ORDER — MIRTAZAPINE 15 MG PO TABS
15.0000 mg | ORAL_TABLET | Freq: Every day | ORAL | 3 refills | Status: DC
Start: 2020-01-12 — End: 2020-01-12

## 2020-01-12 MED ORDER — MIRTAZAPINE 15 MG PO TABS
15.0000 mg | ORAL_TABLET | Freq: Every day | ORAL | 3 refills | Status: DC
Start: 2020-01-12 — End: 2020-01-26

## 2020-01-12 MED ORDER — TRAZODONE HCL 50 MG PO TABS
50.0000 mg | ORAL_TABLET | Freq: Every day | ORAL | 3 refills | Status: DC
Start: 2020-01-12 — End: 2020-10-21

## 2020-01-12 MED ORDER — TRAZODONE HCL 50 MG PO TABS
50.0000 mg | ORAL_TABLET | Freq: Every day | ORAL | 5 refills | Status: DC
Start: 2020-01-12 — End: 2020-01-12

## 2020-01-12 MED ORDER — ESCITALOPRAM OXALATE 10 MG PO TABS: 10.0000 mg | ORAL_TABLET | Freq: Every day | ORAL | 3 refills | Status: DC

## 2020-01-12 NOTE — Assessment & Plan Note (Signed)
Discussed risks of serotonin syndrome

## 2020-01-12 NOTE — Assessment & Plan Note (Signed)
#   Rheumatoid arthritis S:plasn to  Follow up with rheumatology about restarting meloxicam. Thankfully no worsening symptoms off of meloxicam recently. Reports last visit over a year ago- last Dr. Trudie Reed . Also has been on prednisone in past A/P: Interestingly not on any therapy for rheumatoid arthritis and appears to be largely symptom-free-this seems rather atypical and I encouraged follow-up with Dr. Trudie Reed

## 2020-01-12 NOTE — Assessment & Plan Note (Signed)
S: Medication: None Lab Results  Component Value Date   CHOL 257 (H) 05/19/2018   HDL 65.10 05/19/2018   LDLCALC 170 (H) 05/19/2018   LDLDIRECT 143.8 05/15/2013   TRIG 109.0 05/19/2018   CHOLHDL 4 05/19/2018   A/P: 10-year ASCVD risk over 15%-need to strongly consider statin.  I suspect there has been some hesitancy with prior alcohol use and her disease-last LFTs were reassuring.  If reassuring today would consider statin

## 2020-01-12 NOTE — Assessment & Plan Note (Signed)
S:Medication: protonix 40mg   B12 levels related to PPI use: on oral b12 Lab Results  Component Value Date   VITAMINB12 1,185 (H) 11/07/2019   A/P: plans to switch back to OTC prilosec- that is reasonable as long as no worsening reflux

## 2020-01-12 NOTE — Assessment & Plan Note (Addendum)
Stopped after fall July 9th with no recurrence later 2021- skull fracture and subarachnoid hemorrhage 3-4 half gallons a week bourbon. Dts prior noted

## 2020-01-13 LAB — CBC WITH DIFFERENTIAL/PLATELET
Absolute Monocytes: 646 cells/uL (ref 200–950)
Basophils Absolute: 51 cells/uL (ref 0–200)
Basophils Relative: 0.6 %
Eosinophils Absolute: 298 cells/uL (ref 15–500)
Eosinophils Relative: 3.5 %
HCT: 35.3 % — ABNORMAL LOW (ref 38.5–50.0)
Hemoglobin: 12 g/dL — ABNORMAL LOW (ref 13.2–17.1)
Lymphs Abs: 1683 cells/uL (ref 850–3900)
MCH: 32.2 pg (ref 27.0–33.0)
MCHC: 34 g/dL (ref 32.0–36.0)
MCV: 94.6 fL (ref 80.0–100.0)
MPV: 10 fL (ref 7.5–12.5)
Monocytes Relative: 7.6 %
Neutro Abs: 5823 cells/uL (ref 1500–7800)
Neutrophils Relative %: 68.5 %
Platelets: 272 10*3/uL (ref 140–400)
RBC: 3.73 10*6/uL — ABNORMAL LOW (ref 4.20–5.80)
RDW: 12.1 % (ref 11.0–15.0)
Total Lymphocyte: 19.8 %
WBC: 8.5 10*3/uL (ref 3.8–10.8)

## 2020-01-13 LAB — COMPLETE METABOLIC PANEL WITH GFR
AG Ratio: 0.9 (calc) — ABNORMAL LOW (ref 1.0–2.5)
ALT: 15 U/L (ref 9–46)
AST: 11 U/L (ref 10–35)
Albumin: 3.3 g/dL — ABNORMAL LOW (ref 3.6–5.1)
Alkaline phosphatase (APISO): 70 U/L (ref 35–144)
BUN: 13 mg/dL (ref 7–25)
CO2: 21 mmol/L (ref 20–32)
Calcium: 9.2 mg/dL (ref 8.6–10.3)
Chloride: 107 mmol/L (ref 98–110)
Creat: 0.75 mg/dL (ref 0.70–1.18)
GFR, Est African American: 105 mL/min/{1.73_m2} (ref >=60)
GFR, Est Non African American: 90 mL/min/{1.73_m2} (ref >=60)
Globulin: 3.5 g/dL (calc) (ref 1.9–3.7)
Glucose, Bld: 134 mg/dL — ABNORMAL HIGH (ref 65–99)
Potassium: 4.6 mmol/L (ref 3.5–5.3)
Sodium: 136 mmol/L (ref 135–146)
Total Bilirubin: 0.3 mg/dL (ref 0.2–1.2)
Total Protein: 6.8 g/dL (ref 6.1–8.1)

## 2020-01-13 LAB — TSH: TSH: 0.82 mIU/L (ref 0.40–4.50)

## 2020-01-13 LAB — LIPID PANEL (REFL)
Cholesterol: 153 mg/dL (ref <200)
HDL: 34 mg/dL — ABNORMAL LOW (ref >=40)
LDL Cholesterol (Calc): 96 mg/dL (calc)
Non-HDL Cholesterol (Calc): 119 mg/dL (calc) (ref <130)
Total CHOL/HDL Ratio: 4.5 (calc) (ref <5.0)
Triglycerides: 129 mg/dL (ref <150)

## 2020-01-15 ENCOUNTER — Ambulatory Visit: Payer: 59

## 2020-01-15 ENCOUNTER — Other Ambulatory Visit: Payer: Self-pay

## 2020-01-15 DIAGNOSIS — R2689 Other abnormalities of gait and mobility: Secondary | ICD-10-CM

## 2020-01-15 DIAGNOSIS — R41841 Cognitive communication deficit: Secondary | ICD-10-CM | POA: Diagnosis not present

## 2020-01-15 DIAGNOSIS — M6281 Muscle weakness (generalized): Secondary | ICD-10-CM

## 2020-01-15 DIAGNOSIS — R2681 Unsteadiness on feet: Secondary | ICD-10-CM

## 2020-01-15 NOTE — Therapy (Signed)
[facility] [address] [place], [place], [address] Phone: [phone]   Fax:  [phone]  Physical Therapy Treatment  Patient Details  Name: William Gibson MRN: [id number] Date of Birth: Feb 19, 1946 Referring Provider (PT): Reesa Chew   Encounter Date: 01/15/2020   PT End of Session - 01/15/20 1414    Visit Number 6    Number of Visits 17    Date for PT Re-Evaluation 35/57/32   90 day cert but 60 day poc   Authorization Type [employer] and med A secondary so 10th visit progress note    PT Start Time 1410   PT running behind   PT Stop Time 1443    PT Time Calculation (min) 33 min    Equipment Utilized During Treatment Gait belt    Activity Tolerance Patient tolerated treatment well    Behavior During Therapy [facility] for tasks assessed/performed           Past Medical History:  Diagnosis Date  . Abnormal liver function tests 12/15/2018  . Alcoholic liver disease ([place]) 12/15/2018  . Alcoholism ([place])   . Elevated LDL cholesterol level 05/22/2013  . Epistaxis   . Erectile dysfunction 05/22/2013  . Frequent falls 11/09/2018  . GERD (gastroesophageal reflux disease)   . Hyperammonemia ([place]) 11/15/2018  . Memory change 08/17/2017  . RA (rheumatoid arthritis) ([place]) 08/17/2017  . RBBB 12/15/2018  . Rheumatoid arthritis ([place]) 2010  . Shuffling gait 12/19/2018  . Tobacco abuse 04/26/2012  . Weight loss, unintentional 11/09/2018  . 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 12/15/2018    Past Surgical History:  Procedure Laterality Date  . NASAL ENDOSCOPY WITH EPISTAXIS CONTROL N/A 04/12/2018   Procedure: NASAL ENDOSCOPY WITH EPISTAXIS CONTROL;  Surgeon: Melissa Montane, MD;  Location: [facility];  Service: ENT;  Laterality: N/A;  . VASECTOMY    . VASECTOMY REVERSAL      There were no vitals filed for this visit.   Subjective Assessment - 01/15/20 1414    Subjective Pt reports he is doing well. Saw his doctor and they told him to go  back to his normal life but just do it slow. Reports appetite has been much better.    Patient is accompained by: Family member   wife   Pertinent History RA, HTN, fatty liver due to EtOH abuse, gait disorder, report of increased falls x1 week    Patient Stated Goals Pt wants to gain some weight back to get stronger.    Currently in Pain? No/denies    Pain Onset More than a month ago                             [facility] Adult PT Treatment/Exercise - 01/15/20 1421      Transfers   Transfers Sit to Stand;Stand to Sit    Sit to Stand 6: Modified independent (Device/Increase time)    Five time sit to stand comments  11.94 sec without hands from chair.    Stand to Sit 6: Modified independent (Device/Increase time)      Ambulation/Gait   Ambulation/Gait Yes    Ambulation/Gait Assistance 5: Supervision    Ambulation/Gait Assistance Details Pt was instructed to focus on big steps and heel strike. 142/70 after gait for BP    Ambulation Distance (Feet) 590 Feet    Assistive device None    Gait Pattern Step-through pattern    Ambulation Surface Level;Indoor  Neuro Re-ed    Neuro Re-ed Details  At counter: reciprocal steps over 4 cones tapping first x 8 CGA with having to pause each step, side stepping over 4 cones x 4 laps CGA. Alternating toe taps on cone x 10 each leg with cues to go slow and controlled and tighten bottom on stance side. In corner on airex: standing with feet together x 30 sec eyes open, then feet apart with eyes closed x 30 sec with increased sway, feet apart with head turns left/right and up/down x 10 each. marching in place on airex x 10 CGA. Pt was challenged with increasing SLS time.                     PT Short Term Goals - 01/09/20 1535      PT SHORT TERM GOAL #1   Title Pt will be able to perform initial HEP with supervision of family for strengthening and balance.    Baseline Patient reports compliance/independence with HEP    Time 4      Period Weeks    Status Achieved    Target Date 01/10/20      PT SHORT TERM GOAL #2   Title Pt will increase Berg from 43/56 to >47/56 for improved balance and decreased fall risk.    Baseline 43/56 on 12/11/19, 46/56 on 9/14    Time 4    Period Weeks    Status Not Met    Target Date 01/10/20      PT SHORT TERM GOAL #3   Title Pt will ambulate >500' on level surfaces without AD independently for improved household mobility and short community distances.    Baseline 620' without AD, intermittent CGA/supervision    Time 4    Period Weeks    Status Achieved    Target Date 01/10/20      PT SHORT TERM GOAL #4   Title Pt will decreased 5 x sit to stand from 13.51 sec to <11 sec for improved functional strength and balance.    Baseline 13.51 sec from chair without hands on 12/11/19, 11.50 secs w/o UE support    Time 4    Period Weeks    Status Not Met    Target Date 01/10/20      PT SHORT TERM GOAL #5   Title FGA will be assessed and LTG written.    Baseline FGA performed on 12/29/19 with 13/30    Time 4    Period Weeks    Status Achieved    Target Date 01/10/20             PT Long Term Goals - 12/29/19 1550      PT LONG TERM GOAL #1   Title Pt will be independent with progressive HEP for balance and strengthening to continue gains on own.    Time 8    Period Weeks    Status New      PT LONG TERM GOAL #2   Title Pt will ambulate >1000' on varied surfaces independently for improved community mobility.    Time 8    Period Weeks    Status New      PT LONG TERM GOAL #3   Title Pt will increase gait speed from 0.59m/s to >1.74m/s for improved gait safety.    Baseline 0.42m/s on 12/11/19 without AD    Time 8    Period Weeks    Status New      PT  LONG TERM GOAL #4   Title Pt will increase FGA from 13/30 to >19/30 for improved gait safety and decreased fall risk.    Baseline 13/30 on 12/29/19    Time 8    Period Weeks    Status New                 Plan -  01/15/20 1452    Clinical Impression Statement Pt steadier with gait today with bigger steps. PT focused on higher level dynamic balance and standing on compliant surfaces. Pt was challenged with increasing SLS time as well as with standing on compliant surface.    Personal Factors and Comorbidities Comorbidity 3+    Comorbidities RA, HTN, fatty liver due to EtOH abuse, gait disorder, report of increased fall    Examination-Activity Limitations Stand;Locomotion Level;Transfers    Examination-Participation Restrictions Community Activity;Yard Work;Medication Management    Stability/Clinical Decision Making Evolving/Moderate complexity    Rehab Potential Good    PT Frequency 2x / week    PT Duration 8 weeks    PT Treatment/Interventions ADLs/Self Care Home Management;DME Instruction;Gait training;Stair training;Functional mobility training;Therapeutic activities;Therapeutic exercise;Balance training;Patient/family education;Neuromuscular re-education;Vestibular;Passive range of motion    PT Next Visit Plan Gait training without walker, balance training. Pt is hard of hearing.    Consulted and Agree with Plan of Care Patient;Family member/caregiver    Family Member Consulted wife           Patient will benefit from skilled therapeutic intervention in order to improve the following deficits and impairments:  Abnormal gait, Decreased cognition, Decreased balance, Decreased activity tolerance, Decreased strength, Decreased mobility, Decreased knowledge of use of DME, Decreased endurance  Visit Diagnosis: Other abnormalities of gait and mobility  Muscle weakness (generalized)  Unsteadiness on feet     Problem List Patient Active Problem List   Diagnosis Date Noted  . History of pancreatitis 01/12/2020  . Major depression in full remission ([place]) 01/12/2020  . Insomnia 01/12/2020  . Vascular headache   . Hyponatremia   . History of subarachnoid hemorrhage 11/04/2019  . Head injury  11/04/2019  . Hyperlipidemia 02/06/2019  . Shuffling gait 12/19/2018  . Alcoholic liver disease ([place]) 12/15/2018  . RBBB 12/15/2018  . 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 12/15/2018  . Hyperammonemia ([place]) 11/15/2018  . Frequent falls 11/09/2018  . RA (rheumatoid arthritis) ([place]) 08/17/2017  . Memory change 08/17/2017  . Alcohol dependence in early full remission ([place]) 12/15/2016  . Osteoarthritis 01/30/2016  . GERD (gastroesophageal reflux disease) 05/23/2014  . Erectile dysfunction 05/22/2013  . Tobacco abuse 04/26/2012    Electa Sniff, PT, DPT, NCS 01/15/2020, 2:54 PM  [facility] [address] [place], [place], [address] Phone: [phone]   Fax:  [phone]  Name: William Gibson MRN: [id number] Date of Birth: August 16, 1945

## 2020-01-16 ENCOUNTER — Other Ambulatory Visit: Payer: Self-pay

## 2020-01-16 DIAGNOSIS — D619 Aplastic anemia, unspecified: Secondary | ICD-10-CM

## 2020-01-16 DIAGNOSIS — D649 Anemia, unspecified: Secondary | ICD-10-CM

## 2020-01-17 ENCOUNTER — Ambulatory Visit: Payer: 59

## 2020-01-21 ENCOUNTER — Other Ambulatory Visit: Payer: Self-pay | Admitting: Physical Medicine and Rehabilitation

## 2020-01-22 ENCOUNTER — Ambulatory Visit: Payer: 59

## 2020-01-22 ENCOUNTER — Other Ambulatory Visit: Payer: Self-pay

## 2020-01-22 DIAGNOSIS — R41841 Cognitive communication deficit: Secondary | ICD-10-CM | POA: Diagnosis not present

## 2020-01-22 DIAGNOSIS — M6281 Muscle weakness (generalized): Secondary | ICD-10-CM

## 2020-01-22 DIAGNOSIS — R2689 Other abnormalities of gait and mobility: Secondary | ICD-10-CM

## 2020-01-22 DIAGNOSIS — R2681 Unsteadiness on feet: Secondary | ICD-10-CM

## 2020-01-22 NOTE — Therapy (Signed)
[facility] [address] [place], [place], [address] Phone: [phone]   Fax:  [phone]  Physical Therapy Treatment  Patient Details  Name: William Gibson MRN: [id number] Date of Birth: 1945-10-25 Referring Provider (PT): Reesa Chew   Encounter Date: 01/22/2020   PT End of Session - 01/22/20 1406    Visit Number 7    Number of Visits 17    Date for PT Re-Evaluation 71/06/26   90 day cert but 60 day poc   Authorization Type UHC and med A secondary so 10th visit progress note    PT Start Time 1402    PT Stop Time 1444    PT Time Calculation (min) 42 min    Equipment Utilized During Treatment Gait belt    Activity Tolerance Patient tolerated treatment well    Behavior During Therapy [facility] for tasks assessed/performed           Past Medical History:  Diagnosis Date  . Abnormal liver function tests 12/15/2018  . Alcoholic liver disease ([place]) 12/15/2018  . Alcoholism ([place])   . Elevated LDL cholesterol level 05/22/2013  . Epistaxis   . Erectile dysfunction 05/22/2013  . Frequent falls 11/09/2018  . GERD (gastroesophageal reflux disease)   . Hyperammonemia ([place]) 11/15/2018  . Memory change 08/17/2017  . RA (rheumatoid arthritis) ([place]) 08/17/2017  . RBBB 12/15/2018  . Rheumatoid arthritis ([place]) 2010  . Shuffling gait 12/19/2018  . Tobacco abuse 04/26/2012  . Weight loss, unintentional 11/09/2018  . 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 12/15/2018    Past Surgical History:  Procedure Laterality Date  . NASAL ENDOSCOPY WITH EPISTAXIS CONTROL N/A 04/12/2018   Procedure: NASAL ENDOSCOPY WITH EPISTAXIS CONTROL;  Surgeon: Melissa Montane, MD;  Location: [facility];  Service: ENT;  Laterality: N/A;  . VASECTOMY    . VASECTOMY REVERSAL      There were no vitals filed for this visit.   Subjective Assessment - 01/22/20 1405    Subjective Pt reports that he missed last time due to his arthritis acting up. He took his  meloxicam as takes as needed for the pain. Not sure of the strength.    Patient is accompained by: Family member   wife   Pertinent History RA, HTN, fatty liver due to EtOH abuse, gait disorder, report of increased falls x1 week    Patient Stated Goals Pt wants to gain some weight back to get stronger.    Currently in Pain? Yes    Pain Score 2     Pain Location Leg    Pain Orientation Right;Left    Pain Descriptors / Indicators Sore    Pain Type Chronic pain    Pain Onset More than a month ago    Pain Frequency Constant    Aggravating Factors  when does a lot of activity    Pain Relieving Factors rest, medication                             [facility] Adult PT Treatment/Exercise - 01/22/20 1406      Ambulation/Gait   Ambulation/Gait Yes    Ambulation/Gait Assistance 5: Supervision;4: Min guard    Ambulation/Gait Assistance Details Pt was cued to increase step length and foot clearance as tends to shuffle steps. O2 sat=99%. HR=98, BP=138/80 after gait.    Ambulation Distance (Feet) 850 Feet    Assistive device None    Gait Pattern  Step-through pattern;Decreased step length - right;Decreased step length - left;Decreased dorsiflexion - right;Decreased dorsiflexion - left    Ambulation Surface Level;Outdoor;Unlevel;Paved;Grass      Neuro Re-ed    Neuro Re-ed Details  In // bars: reciprocal steps over 4 cones tapping first to increase SLS time x 8 laps with CGA/min assist. Alternating taps on cone x 10 bilateral without UE support. Stepping over 4 hurdles in // bars without UE support with step-to pattern first lap then reciprocal pattern for 2 more laps CGA. Standing on rockerboard: positioned ant/post maintaining level x 30 sec then with playing catch with 2.2# ball with PT guarding x 55mn then turned board lateral and played catch again x 1 min, rocking board side to side x 10.      Exercises   Exercises Other Exercises    Other Exercises  Step-ups on 6' step x 10  bilateral without UE support                  PT Education - 01/22/20 1822    Education Details Added step-ups to HEP    Person(s) Educated Patient    Methods Explanation    Comprehension Verbalized understanding            PT Short Term Goals - 01/09/20 1535      PT SHORT TERM GOAL #1   Title Pt will be able to perform initial HEP with supervision of family for strengthening and balance.    Baseline Patient reports compliance/independence with HEP    Time 4    Period Weeks    Status Achieved    Target Date 01/10/20      PT SHORT TERM GOAL #2   Title Pt will increase Berg from 43/56 to >47/56 for improved balance and decreased fall risk.    Baseline 43/56 on 12/11/19, 46/56 on 9/14    Time 4    Period Weeks    Status Not Met    Target Date 01/10/20      PT SHORT TERM GOAL #3   Title Pt will ambulate >500' on level surfaces without AD independently for improved household mobility and short community distances.    Baseline 620' without AD, intermittent CGA/supervision    Time 4    Period Weeks    Status Achieved    Target Date 01/10/20      PT SHORT TERM GOAL #4   Title Pt will decreased 5 x sit to stand from 13.51 sec to <11 sec for improved functional strength and balance.    Baseline 13.51 sec from chair without hands on 12/11/19, 11.50 secs w/o UE support    Time 4    Period Weeks    Status Not Met    Target Date 01/10/20      PT SHORT TERM GOAL #5   Title FGA will be assessed and LTG written.    Baseline FGA performed on 12/29/19 with 13/30    Time 4    Period Weeks    Status Achieved    Target Date 01/10/20             PT Long Term Goals - 12/29/19 1550      PT LONG TERM GOAL #1   Title Pt will be independent with progressive HEP for balance and strengthening to continue gains on own.    Time 8    Period Weeks    Status New      PT LONG TERM GOAL #2  Title Pt will ambulate >1000' on varied surfaces independently for improved community  mobility.    Time 8    Period Weeks    Status New      PT LONG TERM GOAL #3   Title Pt will increase gait speed from 0.36ms to >1.157m for improved gait safety.    Baseline 0.9366mon 12/11/19 without AD    Time 8    Period Weeks    Status New      PT LONG TERM GOAL #4   Title Pt will increase FGA from 13/30 to >19/30 for improved gait safety and decreased fall risk.    Baseline 13/30 on 12/29/19    Time 8    Period Weeks    Status New                 Plan - 01/22/20 1822    Clinical Impression Statement Pt continues to need cues to increase steps and foot clearance as tends to shuffle steps. He was able to increase over grass more. Pt challenged with increasing SLS time.    Personal Factors and Comorbidities Comorbidity 3+    Comorbidities RA, HTN, fatty liver due to EtOH abuse, gait disorder, report of increased fall    Examination-Activity Limitations Stand;Locomotion Level;Transfers    Examination-Participation Restrictions Community Activity;Yard Work;Medication Management    Stability/Clinical Decision Making Evolving/Moderate complexity    Rehab Potential Good    PT Frequency 2x / week    PT Duration 8 weeks    PT Treatment/Interventions ADLs/Self Care Home Management;DME Instruction;Gait training;Stair training;Functional mobility training;Therapeutic activities;Therapeutic exercise;Balance training;Patient/family education;Neuromuscular re-education;Vestibular;Passive range of motion    PT Next Visit Plan Gait training without walker, balance training with working on increasing SLS time and compliant surfaces.. Pt is hard of hearing.    Consulted and Agree with Plan of Care Patient;Family member/caregiver    Family Member Consulted wife           Patient will benefit from skilled therapeutic intervention in order to improve the following deficits and impairments:  Abnormal gait, Decreased cognition, Decreased balance, Decreased activity tolerance, Decreased  strength, Decreased mobility, Decreased knowledge of use of DME, Decreased endurance  Visit Diagnosis: Other abnormalities of gait and mobility  Muscle weakness (generalized)  Unsteadiness on feet     Problem List Patient Active Problem List   Diagnosis Date Noted  . History of pancreatitis 01/12/2020  . Major depression in full remission ([place]9/17/2021  . Insomnia 01/12/2020  . Vascular headache   . Hyponatremia   . History of subarachnoid hemorrhage 11/04/2019  . Head injury 11/04/2019  . Hyperlipidemia 02/06/2019  . Shuffling gait 12/19/2018  . Alcoholic liver disease ([place]8/20/2020  . RBBB 12/15/2018  . 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 12/15/2018  . Hyperammonemia ([place]7/21/2020  . Frequent falls 11/09/2018  . RA (rheumatoid arthritis) ([place]4/23/2019  . Memory change 08/17/2017  . Alcohol dependence in early full remission ([place]8/21/2018  . Osteoarthritis 01/30/2016  . GERD (gastroesophageal reflux disease) 05/23/2014  . Erectile dysfunction 05/22/2013  . Tobacco abuse 04/26/2012    EmiElecta SniffT, DPT, NCS 01/22/2020, 6:24 PM  [facility]: [phone]:  [phone]: DonSEITH AIKEYN: [id number] of Birth: [birth year]

## 2020-01-22 NOTE — Patient Instructions (Signed)
Access Code: [email or web address] URL: [email or web address] Date: 01/22/2020 Prepared by: Cherly Anderson  Exercises Sit to Stand - 1 x daily - 5 x weekly - 1 sets - 10 reps Side Stepping with Counter Support - 1 x daily - 5 x weekly - 1 sets - 3 reps Walking March - 1 x daily - 5 x weekly - 1 sets - 3 reps Tandem Walking with Counter Support - 1 x daily - 5 x weekly - 1 sets - 3 reps Standing Balance with Eyes Closed on Foam - 1 x daily - 5 x weekly - 1 sets - 3 reps - 30 hold Wide Stance with Head Nods on Foam Pad - 1 x daily - 5 x weekly - 1 sets - 10 reps Wide Stance with Head Rotation on Foam Pad - 1 x daily - 5 x weekly - 1 sets - 10 reps Seated Hamstring Stretch - 1 x daily - 5 x weekly - 1 sets - 3 reps - 30 seconds hold Forward Step Up - 1 x daily - 5 x weekly - 2 sets - 10 reps

## 2020-01-24 ENCOUNTER — Ambulatory Visit: Payer: 59

## 2020-01-24 ENCOUNTER — Other Ambulatory Visit: Payer: Self-pay

## 2020-01-24 DIAGNOSIS — R2681 Unsteadiness on feet: Secondary | ICD-10-CM

## 2020-01-24 DIAGNOSIS — R2689 Other abnormalities of gait and mobility: Secondary | ICD-10-CM

## 2020-01-24 DIAGNOSIS — M6281 Muscle weakness (generalized): Secondary | ICD-10-CM

## 2020-01-24 DIAGNOSIS — R41841 Cognitive communication deficit: Secondary | ICD-10-CM | POA: Diagnosis not present

## 2020-01-24 NOTE — Therapy (Signed)
[facility] [address] [place], [place], [address] Phone: [phone]   Fax:  [phone]  Physical Therapy Treatment  Patient Details  Name: William Gibson MRN: [id number] Date of Birth: June 26, 1945 Referring Provider (PT): Reesa Chew   Encounter Date: 01/24/2020   PT End of Session - 01/24/20 1616    Visit Number 8    Number of Visits 17    Date for PT Re-Evaluation 71/06/26   90 day cert but 60 day poc   Authorization Type [employer] and med A secondary so 10th visit progress note    PT Start Time 1615    PT Stop Time 1655    PT Time Calculation (min) 40 min    Equipment Utilized During Treatment Gait belt    Activity Tolerance Patient tolerated treatment well    Behavior During Therapy [facility] for tasks assessed/performed           Past Medical History:  Diagnosis Date   Abnormal liver function tests 9/48/5462   Alcoholic liver disease ([place]) 12/15/2018   Alcoholism ([place])    Elevated LDL cholesterol level 05/22/2013   Epistaxis    Erectile dysfunction 05/22/2013   Frequent falls 11/09/2018   GERD (gastroesophageal reflux disease)    Hyperammonemia ([place]) 11/15/2018   Memory change 08/17/2017   RA (rheumatoid arthritis) ([place]) 08/17/2017   RBBB 12/15/2018   Rheumatoid arthritis ([place]) 2010   Shuffling gait 12/19/2018   Tobacco abuse 04/26/2012   Weight loss, unintentional 11/09/2018   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 12/15/2018    Past Surgical History:  Procedure Laterality Date   NASAL ENDOSCOPY WITH EPISTAXIS CONTROL N/A 04/12/2018   Procedure: NASAL ENDOSCOPY WITH EPISTAXIS CONTROL;  Surgeon: Melissa Montane, MD;  Location: [facility];  Service: ENT;  Laterality: N/A;   VASECTOMY     VASECTOMY REVERSAL      There were no vitals filed for this visit.   Subjective Assessment - 01/24/20 1617    Subjective Pt reports that he was painful in joints after last therapy session. He took his  medicine later and it helped. Pt reports that his doctor told him to go back to normal life just moving slower. States he has returned to driving and drove himself to therapy.    Patient is accompained by: Family member   wife   Pertinent History RA, HTN, fatty liver due to EtOH abuse, gait disorder, report of increased falls x1 week    Patient Stated Goals Pt wants to gain some weight back to get stronger.    Currently in Pain? No/denies    Pain Onset More than a month ago                             [facility] Adult PT Treatment/Exercise - 01/24/20 1618      Ambulation/Gait   Ambulation/Gait Yes    Ambulation/Gait Assistance 5: Supervision;4: Min guard    Ambulation/Gait Assistance Details Pt was given verbal cues to increase foot clearance. Pt walks on front of foot scuffing feet with forward flexed trunk. Pt reported feeling tired in legs after.    Ambulation Distance (Feet) 850 Feet    Assistive device None    Gait Pattern Step-through pattern;Decreased step length - right;Decreased step length - left;Decreased dorsiflexion - right;Decreased dorsiflexion - left    Ambulation Surface Level;Unlevel;Outdoor;Paved      Neuro Re-ed    Neuro Re-ed Details  Standing on airex with alternating toe taps on cone x 10 bilateral. Pt with forward flexed posture. Knocked over cone a couple times and bends over quickly to fix with decreased safety awareness CGA. Reciprocal steps over 8 hurdles of varied height x 4 laps then with adding in dynadisc x 2 throughout to decrease stability x 2 laps CGA. Side stepping over 8 hurdles x 2 CGA. In // bars: large steps over foam beam x 5 each leg. SLS on airex x 10 sec with min assist. Gait with counting backwards by 3s x 115', walking with naming animals x 115', gait with head turns up/down 20' x 2, gait with head turns left/right 30' x 2 CGA. Pt did have some staggering noted with hoizontal head turns and slower cadence. Gait with weaving in and out of  7 cones x 1 then with naming different fruits. Tandem gait 30' with min/mod assist at times but improved as went on. Backwards gait 30' x 2 with verbal cues to increase step length. Backwards walk up the ramp then forwards down x 2 bouts each. Tandem gait 20' x 1 again min assist. Gait with eyes closed  x 30' with veering to the left. Performed x 2 with improvement noted with straighter course than prior.      Exercises   Exercises Other Exercises    Other Exercises  Step-ups on 8" step x 10 alternating feet CGA.                    PT Short Term Goals - 01/09/20 1535      PT SHORT TERM GOAL #1   Title Pt will be able to perform initial HEP with supervision of family for strengthening and balance.    Baseline Patient reports compliance/independence with HEP    Time 4    Period Weeks    Status Achieved    Target Date 01/10/20      PT SHORT TERM GOAL #2   Title Pt will increase Berg from 43/56 to >47/56 for improved balance and decreased fall risk.    Baseline 43/56 on 12/11/19, 46/56 on 9/14    Time 4    Period Weeks    Status Not Met    Target Date 01/10/20      PT SHORT TERM GOAL #3   Title Pt will ambulate >500' on level surfaces without AD independently for improved household mobility and short community distances.    Baseline 620' without AD, intermittent CGA/supervision    Time 4    Period Weeks    Status Achieved    Target Date 01/10/20      PT SHORT TERM GOAL #4   Title Pt will decreased 5 x sit to stand from 13.51 sec to <11 sec for improved functional strength and balance.    Baseline 13.51 sec from chair without hands on 12/11/19, 11.50 secs w/o UE support    Time 4    Period Weeks    Status Not Met    Target Date 01/10/20      PT SHORT TERM GOAL #5   Title FGA will be assessed and LTG written.    Baseline FGA performed on 12/29/19 with 13/30    Time 4    Period Weeks    Status Achieved    Target Date 01/10/20             PT Long Term Goals -  12/29/19 1550      PT LONG  TERM GOAL #1   Title Pt will be independent with progressive HEP for balance and strengthening to continue gains on own.    Time 8    Period Weeks    Status New      PT LONG TERM GOAL #2   Title Pt will ambulate >1000' on varied surfaces independently for improved community mobility.    Time 8    Period Weeks    Status New      PT LONG TERM GOAL #3   Title Pt will increase gait speed from 0.16m/s to >1.15m/s for improved gait safety.    Baseline 0.72m/s on 12/11/19 without AD    Time 8    Period Weeks    Status New      PT LONG TERM GOAL #4   Title Pt will increase FGA from 13/30 to >19/30 for improved gait safety and decreased fall risk.    Baseline 13/30 on 12/29/19    Time 8    Period Weeks    Status New                 Plan - 01/24/20 2025    Clinical Impression Statement Pt continues to have decreased step length and walks on front of feet with forward flexed posture. Worked on trying to increase step length with activities. Pt did well with cognitive tasks with activities but does show some impulsiveness with decreased safety awareness.    Personal Factors and Comorbidities Comorbidity 3+    Comorbidities RA, HTN, fatty liver due to EtOH abuse, gait disorder, report of increased fall    Examination-Activity Limitations Stand;Locomotion Level;Transfers    Examination-Participation Restrictions Community Activity;Yard Work;Medication Management    Stability/Clinical Decision Making Evolving/Moderate complexity    Rehab Potential Good    PT Frequency 2x / week    PT Duration 8 weeks    PT Treatment/Interventions ADLs/Self Care Home Management;DME Instruction;Gait training;Stair training;Functional mobility training;Therapeutic activities;Therapeutic exercise;Balance training;Patient/family education;Neuromuscular re-education;Vestibular;Passive range of motion    PT Next Visit Plan Try to incorporate some postural strengthening to try to get  more upright posture with activities as tends to lean forward. Gait training without walker, balance training with working on increasing SLS time and compliant surfaces.. Pt is hard of hearing.    Consulted and Agree with Plan of Care Patient;Family member/caregiver    Family Member Consulted wife           Patient will benefit from skilled therapeutic intervention in order to improve the following deficits and impairments:  Abnormal gait, Decreased cognition, Decreased balance, Decreased activity tolerance, Decreased strength, Decreased mobility, Decreased knowledge of use of DME, Decreased endurance  Visit Diagnosis: Other abnormalities of gait and mobility  Muscle weakness (generalized)  Unsteadiness on feet     Problem List Patient Active Problem List   Diagnosis Date Noted   History of pancreatitis 01/12/2020   Major depression in full remission ([place]) 01/12/2020   Insomnia 01/12/2020   Vascular headache    Hyponatremia    History of subarachnoid hemorrhage 11/04/2019   Head injury 11/04/2019   Hyperlipidemia 02/06/2019   Shuffling gait 52/77/8242   Alcoholic liver disease ([place]) 12/15/2018   RBBB 12/15/2018   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 12/15/2018   Hyperammonemia ([place]) 11/15/2018   Frequent falls 11/09/2018   RA (rheumatoid arthritis) ([place]) 08/17/2017   Memory change 08/17/2017   Alcohol dependence in early full remission ([place]) 12/15/2016   Osteoarthritis 01/30/2016   GERD (gastroesophageal reflux  disease) 05/23/2014   Erectile dysfunction 05/22/2013   Tobacco abuse 04/26/2012    Electa Sniff, PT, DPT, NCS 01/24/2020, 8:29 PM  [facility] [address] [place], [place], [address] Phone: [phone]   Fax:  [phone]  Name: William Gibson MRN: [id number] Date of Birth: August 17, 1945

## 2020-01-25 NOTE — Progress Notes (Signed)
Subjective:    Patient ID: William Gibson, male    DOB: 30-Mar-1946, 74 y.o.   MRN: [id number]  HPI  William Gibson is a 74 year old man who presents for follow-up of TBI.  He has stopped OT. He does not like SLP.   He has stopped drinking.   His wife worries about him falling.   He has followed with NSGY and was cleared.   His goal is go walk outside.   He is cracking jokes.   He is gaining weight back. He weighs 152 lbs. He has gained 12 lbs since last visit one month ago.  Denies depression.   He takes Prilosec as needed for GERD.   He has stopped taking the Mirtazepine.   Pain is 0/10.   Pain Inventory Average Pain 0 Pain Right Now 0 My pain is aching and no pain  LOCATION OF PAIN  No pain   BOWEL Number of stools per week: 8 Oral laxative use No  Type of laxative  Enema or suppository use No  History of colostomy No  Incontinent No   BLADDER Normal In and out cath, frequency  Able to self cath n/a Bladder incontinence No  Frequent urination No  Leakage with coughing No  Difficulty starting stream No  Incomplete bladder emptying No    Mobility walk with assistance use a walker ability to climb steps?  yes do you drive?  yes  Function retired  Neuro/Psych weakness trouble walking  Prior Studies Any changes since last visit?  no  Physicians involved in your care Any changes since last visit?  no   Family History  Problem Relation Age of Onset  . Lung cancer Father   . Heart disease Mother   . CAD Brother   . Aneurysm Brother   . Colon cancer Neg Hx   . Esophageal cancer Neg Hx   . Stomach cancer Neg Hx    Social History   Socioeconomic History  . Marital status: Married    Spouse name: Not on file  . Number of children: Not on file  . Years of education: Not on file  . Highest education level: Not on file  Occupational History  . Not on file  Tobacco Use  . Smoking status: Current Every Day Smoker    Packs/day: 1.50     Years: 58.00    Pack years: 87.00    Types: Cigarettes  . Smokeless tobacco: Never Used  Vaping Use  . Vaping Use: Never used  Substance and Sexual Activity  . Alcohol use: Not Currently    Comment: 1/5 whiskey a day prior  . Drug use: No  . Sexual activity: Not Currently  Other Topics Concern  . Not on file  Social History Narrative   Married. 3 children. 8 grandkids. 1 greatgrandkid.       Retired from being self employed Psychologist, counselling         Does not have a living will.   Desires CPR, does not want prolonged life support if futile.   Social Determinants of Health   Financial Resource Strain:   . Difficulty of Paying Living Expenses: Not on file  Food Insecurity:   . Worried About Charity fundraiser in the Last Year: Not on file  . Ran Out of Food in the Last Year: Not on file  Transportation Needs:   . Lack of Transportation (Medical): Not on file  . Lack of Transportation (Non-Medical): Not on  file  Physical Activity:   . Days of Exercise per Week: Not on file  . Minutes of Exercise per Session: Not on file  Stress:   . Feeling of Stress : Not on file  Social Connections:   . Frequency of Communication with Friends and Family: Not on file  . Frequency of Social Gatherings with Friends and Family: Not on file  . Attends Religious Services: Not on file  . Active Member of Clubs or Organizations: Not on file  . Attends Archivist Meetings: Not on file  . Marital Status: Not on file   Past Surgical History:  Procedure Laterality Date  . NASAL ENDOSCOPY WITH EPISTAXIS CONTROL N/A 04/12/2018   Procedure: NASAL ENDOSCOPY WITH EPISTAXIS CONTROL;  Surgeon: Melissa Montane, MD;  Location: [facility];  Service: ENT;  Laterality: N/A;  . VASECTOMY    . VASECTOMY REVERSAL     Past Medical History:  Diagnosis Date  . Abnormal liver function tests 12/15/2018  . Alcoholic liver disease ([place]) 12/15/2018  . Alcoholism ([place])   . Elevated LDL cholesterol level 05/22/2013    . Epistaxis   . Erectile dysfunction 05/22/2013  . Frequent falls 11/09/2018  . GERD (gastroesophageal reflux disease)   . Hyperammonemia ([place]) 11/15/2018  . Memory change 08/17/2017  . RA (rheumatoid arthritis) ([place]) 08/17/2017  . RBBB 12/15/2018  . Rheumatoid arthritis ([place]) 2010  . Shuffling gait 12/19/2018  . Tobacco abuse 04/26/2012  . Weight loss, unintentional 11/09/2018  . 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 12/15/2018   BP 127/80   Pulse 93   Temp 98.2 F (36.8 C)   Ht 5\' 8"  (1.727 m)   Wt 152 lb (68.9 kg)   SpO2 98%   BMI 23.11 kg/m   Opioid Risk Score:   Fall Risk Score:  `1  Depression screen PHQ 2/9  Depression screen [facility] 2/9 12/06/2019 11/16/2018 06/01/2017 01/30/2016 05/22/2013 05/22/2013  Decreased Interest 0 0 0 0 0 0  Down, Depressed, Hopeless 0 0 0 0 0 0  PHQ - 2 Score 0 0 0 0 0 0  Altered sleeping 3 - - - - -  Tired, decreased energy 1 - - - - -  Change in appetite 0 - - - - -  Feeling bad or failure about yourself  0 - - - - -  Trouble concentrating 1 - - - - -  Moving slowly or fidgety/restless 1 - - - - -  Suicidal thoughts 0 - - - - -  PHQ-9 Score 6 - - - - -    Review of Systems  Constitutional: Positive for unexpected weight change.  HENT: Negative.   Eyes: Negative.   Respiratory: Negative.   Cardiovascular: Negative.   Gastrointestinal: Negative.   Endocrine: Negative.   Genitourinary: Negative.   Musculoskeletal: Positive for gait problem.  Skin: Negative.   Allergic/Immunologic: Negative.   Neurological: Positive for tremors, weakness and headaches.       Tingling   Psychiatric/Behavioral: Positive for confusion.  All other systems reviewed and are negative.      Objective:   Physical Exam Gen: no distress, normal appearing HEENT: oral mucosa pink and moist, NCAT, hard of hearing Cardio: Reg rate Chest: normal effort, normal rate of breathing Abd: soft, non-distended Ext: no edema Skin: intact Neuro: Alert   Musculoskeletal: Wide based gait w/o AD Psych: pleasant, does not initiate.       Assessment & Plan:  William Gibson is a 74 year  old man who presents for follow-up of TBI following fall after drinking alcohol.   Hydrocephalus: -Mild on CT. NSGY has discharged from practice.   Impaired mobility and ADLs post-TBI -Continue ouptatient PT and SLP -Discussed FMLA paperwork. He cannot be left alone. He needs 24/7 supervision from his wife.   Decreased appetite: -Resolved. Stopped megace.  -Stop Mirtazepine.   Depression: -Resolved.   Hard of hearing: -Provide a referral.   Alcoholism: Continue thiamine and folic acid. He has not resumed drinking!  Cleared to restart driving.  Encouraged working outdoors in the yard.   He is walking on his own without a walker. Discussed that he is will likely need only a few more outpatient therapy sessions  All questions answered. 40 minutes spent in discussion with patient regarding depression, decrease appetite, hard of hearing, walking, balance, alcoholism, outpatient therapy, caregiver burnout, goals, reviewing his medications, FMLA paperwork, hydrocephalus

## 2020-01-26 ENCOUNTER — Encounter: Payer: Self-pay | Admitting: Physical Medicine and Rehabilitation

## 2020-01-26 ENCOUNTER — Other Ambulatory Visit: Payer: Self-pay

## 2020-01-26 ENCOUNTER — Encounter: Payer: 59 | Attending: Registered Nurse | Admitting: Physical Medicine and Rehabilitation

## 2020-01-26 VITALS — BP 127/80 | HR 93 | Temp 98.2°F | Ht 68.0 in | Wt 152.0 lb

## 2020-01-26 DIAGNOSIS — G441 Vascular headache, not elsewhere classified: Secondary | ICD-10-CM | POA: Diagnosis not present

## 2020-01-26 DIAGNOSIS — F1011 Alcohol abuse, in remission: Secondary | ICD-10-CM | POA: Diagnosis not present

## 2020-01-26 DIAGNOSIS — Z9181 History of falling: Secondary | ICD-10-CM | POA: Diagnosis not present

## 2020-01-26 DIAGNOSIS — W19XXXD Unspecified fall, subsequent encounter: Secondary | ICD-10-CM | POA: Diagnosis not present

## 2020-01-26 DIAGNOSIS — S0990XD Unspecified injury of head, subsequent encounter: Secondary | ICD-10-CM | POA: Diagnosis not present

## 2020-01-26 DIAGNOSIS — H918X1 Other specified hearing loss, right ear: Secondary | ICD-10-CM | POA: Insufficient documentation

## 2020-01-26 DIAGNOSIS — K219 Gastro-esophageal reflux disease without esophagitis: Secondary | ICD-10-CM | POA: Insufficient documentation

## 2020-01-26 DIAGNOSIS — R42 Dizziness and giddiness: Secondary | ICD-10-CM | POA: Diagnosis not present

## 2020-01-26 DIAGNOSIS — F1020 Alcohol dependence, uncomplicated: Secondary | ICD-10-CM

## 2020-01-26 DIAGNOSIS — G919 Hydrocephalus, unspecified: Secondary | ICD-10-CM | POA: Diagnosis not present

## 2020-01-26 DIAGNOSIS — W19XXXS Unspecified fall, sequela: Secondary | ICD-10-CM | POA: Insufficient documentation

## 2020-01-26 DIAGNOSIS — G911 Obstructive hydrocephalus: Secondary | ICD-10-CM

## 2020-01-26 DIAGNOSIS — R2689 Other abnormalities of gait and mobility: Secondary | ICD-10-CM | POA: Insufficient documentation

## 2020-01-26 DIAGNOSIS — S069X0S Unspecified intracranial injury without loss of consciousness, sequela: Secondary | ICD-10-CM

## 2020-01-26 DIAGNOSIS — W19XXXA Unspecified fall, initial encounter: Secondary | ICD-10-CM

## 2020-01-26 DIAGNOSIS — F17210 Nicotine dependence, cigarettes, uncomplicated: Secondary | ICD-10-CM | POA: Diagnosis not present

## 2020-01-29 ENCOUNTER — Ambulatory Visit: Payer: 59 | Attending: Physical Medicine and Rehabilitation

## 2020-01-29 ENCOUNTER — Other Ambulatory Visit: Payer: Self-pay

## 2020-01-29 DIAGNOSIS — R296 Repeated falls: Secondary | ICD-10-CM | POA: Diagnosis not present

## 2020-01-29 DIAGNOSIS — R2681 Unsteadiness on feet: Secondary | ICD-10-CM | POA: Insufficient documentation

## 2020-01-29 DIAGNOSIS — R2689 Other abnormalities of gait and mobility: Secondary | ICD-10-CM | POA: Insufficient documentation

## 2020-01-29 DIAGNOSIS — M6281 Muscle weakness (generalized): Secondary | ICD-10-CM | POA: Diagnosis not present

## 2020-01-29 NOTE — Therapy (Signed)
[facility] [address] [place], [place], [address] Phone: [phone]   Fax:  [phone]  Physical Therapy Treatment  Patient Details  Name: William Gibson MRN: [id number] Date of Birth: Nov 13, 1945 Referring Provider (PT): Reesa Chew   Encounter Date: 01/29/2020   PT End of Session - 01/29/20 1449    Visit Number 9    Number of Visits 17    Date for PT Re-Evaluation 93/23/55   90 day cert but 60 day poc   Authorization Type [employer] and med A secondary so 10th visit progress note    PT Start Time 1447    PT Stop Time 1530    PT Time Calculation (min) 43 min    Equipment Utilized During Treatment Gait belt    Activity Tolerance Patient tolerated treatment well    Behavior During Therapy [facility] for tasks assessed/performed           Past Medical History:  Diagnosis Date  . Abnormal liver function tests 12/15/2018  . Alcoholic liver disease ([place]) 12/15/2018  . Alcoholism ([place])   . Elevated LDL cholesterol level 05/22/2013  . Epistaxis   . Erectile dysfunction 05/22/2013  . Frequent falls 11/09/2018  . GERD (gastroesophageal reflux disease)   . Hyperammonemia ([place]) 11/15/2018  . Memory change 08/17/2017  . RA (rheumatoid arthritis) ([place]) 08/17/2017  . RBBB 12/15/2018  . Rheumatoid arthritis ([place]) 2010  . Shuffling gait 12/19/2018  . Tobacco abuse 04/26/2012  . Weight loss, unintentional 11/09/2018  . 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 12/15/2018    Past Surgical History:  Procedure Laterality Date  . NASAL ENDOSCOPY WITH EPISTAXIS CONTROL N/A 04/12/2018   Procedure: NASAL ENDOSCOPY WITH EPISTAXIS CONTROL;  Surgeon: Melissa Montane, MD;  Location: [facility];  Service: ENT;  Laterality: N/A;  . VASECTOMY    . VASECTOMY REVERSAL      There were no vitals filed for this visit.   Subjective Assessment - 01/29/20 1450    Subjective Paitent reports that appt with Dr. Ranell Patrick went well, no changes to medications.  Reports he did some yard work outside with daughter and son in laws help. Patient reports some soreness in the legs the next day. No falls.    Patient is accompained by: Family member   wife   Pertinent History RA, HTN, fatty liver due to EtOH abuse, gait disorder, report of increased falls x1 week    Patient Stated Goals Pt wants to gain some weight back to get stronger.    Currently in Pain? No/denies    Pain Onset More than a month ago                             [facility] Adult PT Treatment/Exercise - 01/29/20 0001      Transfers   Transfers Sit to Stand;Stand to Sit    Sit to Stand 5: Supervision    Stand to Sit 5: Supervision    Comments completed sit <> stand training with BLE's on blue mat, x 10 reps. PT providing verbal cues for control with descent.       Ambulation/Gait   Ambulation/Gait Yes    Ambulation/Gait Assistance 5: Supervision    Ambulation/Gait Assistance Details completed ambulation on indoor surfaces without AD. patient demo improved step length and stability with ambulation today during session.     Ambulation Distance (Feet) 500 Feet    Assistive device None  Gait Pattern Step-through pattern;Decreased step length - right;Decreased step length - left;Decreased dorsiflexion - right;Decreased dorsiflexion - left    Ambulation Surface Level;Indoor      High Level Balance   High Level Balance Activities Negotiating over obstacles    High Level Balance Comments completed obstalce course including stepping over various height orange hurdles as well as stepping over cones with toe tap to cone prior to stepping over to further promote SLS, completed x 5 laps, down and back. Increased difficulty noted with toe tap to cone prior to stepping over requiring CGA. Progressed obstacle course to include ambulation over blue mat with lateral toe tap to cones placed on R/L, completed x 4 laps down and back mat.       Neuro Re-ed    Neuro Re-ed Details  Completed  ambulation with self ball toss w/ 1# weighted ball, completed 4 x 30'. Added in cognitive task with naming of animals/vegetables/fruits.  Increased CGA required and difficulty noted with dual tasking. Patient often priotizing manual/walking task over cognitive task.                Balance Exercises - 01/29/20 0001      Balance Exercises: Standing   Tandem Gait Forward;Foam/compliant surface;3 reps    Tandem Gait Limitations in hallway: completed tandem walking on blue mat and firm surface, x 3 laps, down and back with intermittent CGA required.     Sidestepping Foam/compliant support;3 reps;Limitations    Sidestepping Limitations in hallway: completed sidestepping on blue mat and firm surface, x 3 laps, down and back with intermittent CGA required. verbal cues to keep hips/toes pointed forward for proper technique.     Marching Foam/compliant surface;Solid surface;Forwards;Limitations    Marching Limitations in hallway: completed marching forwards on blue mat and firm surface, x 3 laps, down and back with intermittent CGA required. verbal cues for slowed pace to further promote SLS. patient demo difficulty with transition on/off foam surface.                PT Short Term Goals - 01/09/20 1535      PT SHORT TERM GOAL #1   Title Pt will be able to perform initial HEP with supervision of family for strengthening and balance.    Baseline Patient reports compliance/independence with HEP    Time 4    Period Weeks    Status Achieved    Target Date 01/10/20      PT SHORT TERM GOAL #2   Title Pt will increase Berg from 43/56 to >47/56 for improved balance and decreased fall risk.    Baseline 43/56 on 12/11/19, 46/56 on 9/14    Time 4    Period Weeks    Status Not Met    Target Date 01/10/20      PT SHORT TERM GOAL #3   Title Pt will ambulate >500' on level surfaces without AD independently for improved household mobility and short community distances.    Baseline 620' without AD,  intermittent CGA/supervision    Time 4    Period Weeks    Status Achieved    Target Date 01/10/20      PT SHORT TERM GOAL #4   Title Pt will decreased 5 x sit to stand from 13.51 sec to <11 sec for improved functional strength and balance.    Baseline 13.51 sec from chair without hands on 12/11/19, 11.50 secs w/o UE support    Time 4    Period Weeks  Status Not Met    Target Date 01/10/20      PT SHORT TERM GOAL #5   Title FGA will be assessed and LTG written.    Baseline FGA performed on 12/29/19 with 13/30    Time 4    Period Weeks    Status Achieved    Target Date 01/10/20             PT Long Term Goals - 12/29/19 1550      PT LONG TERM GOAL #1   Title Pt will be independent with progressive HEP for balance and strengthening to continue gains on own.    Time 8    Period Weeks    Status New      PT LONG TERM GOAL #2   Title Pt will ambulate >1000' on varied surfaces independently for improved community mobility.    Time 8    Period Weeks    Status New      PT LONG TERM GOAL #3   Title Pt will increase gait speed from 0.61m/s to >1.40m/s for improved gait safety.    Baseline 0.56m/s on 12/11/19 without AD    Time 8    Period Weeks    Status New      PT LONG TERM GOAL #4   Title Pt will increase FGA from 13/30 to >19/30 for improved gait safety and decreased fall risk.    Baseline 13/30 on 12/29/19    Time 8    Period Weeks    Status New                 Plan - 01/29/20 1633    Clinical Impression Statement With ambulation today patient demo improved step length and control with ambulation as well as improved posture. Continued dual tasking activites and balance activites, with patient demo increased difficulty with addition of dual task and on complaint surfaces. Impulsiveness still noted at times throughout session. Will continue to progress toward all goals.    Personal Factors and Comorbidities Comorbidity 3+    Comorbidities RA, HTN, fatty liver due  to EtOH abuse, gait disorder, report of increased fall    Examination-Activity Limitations Stand;Locomotion Level;Transfers    Examination-Participation Restrictions Community Activity;Yard Work;Medication Management    Stability/Clinical Decision Making Evolving/Moderate complexity    Rehab Potential Good    PT Frequency 2x / week    PT Duration 8 weeks    PT Treatment/Interventions ADLs/Self Care Home Management;DME Instruction;Gait training;Stair training;Functional mobility training;Therapeutic activities;Therapeutic exercise;Balance training;Patient/family education;Neuromuscular re-education;Vestibular;Passive range of motion    PT Next Visit Plan continue dual tasking activites (complaint surfaces) incorporate some postural strengthening to try to get more upright posture with activities as tends to lean forward. Gait training without walker, balance training with working on increasing SLS time and compliant surfaces. Pt is hard of hearing.    Consulted and Agree with Plan of Care Patient;Family member/caregiver    Family Member Consulted wife           Patient will benefit from skilled therapeutic intervention in order to improve the following deficits and impairments:  Abnormal gait, Decreased cognition, Decreased balance, Decreased activity tolerance, Decreased strength, Decreased mobility, Decreased knowledge of use of DME, Decreased endurance  Visit Diagnosis: Other abnormalities of gait and mobility  Muscle weakness (generalized)  Unsteadiness on feet  Frequent falls     Problem List Patient Active Problem List   Diagnosis Date Noted  . History of pancreatitis 01/12/2020  . Major depression  in full remission ([place]) 01/12/2020  . Insomnia 01/12/2020  . Vascular headache   . Hyponatremia   . History of subarachnoid hemorrhage 11/04/2019  . Head injury 11/04/2019  . Hyperlipidemia 02/06/2019  . Shuffling gait 12/19/2018  . Alcoholic liver disease ([place]) 12/15/2018    . RBBB 12/15/2018  . 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 12/15/2018  . Hyperammonemia ([place]) 11/15/2018  . Frequent falls 11/09/2018  . RA (rheumatoid arthritis) ([place]) 08/17/2017  . Memory change 08/17/2017  . Alcohol dependence in early full remission ([place]) 12/15/2016  . Osteoarthritis 01/30/2016  . GERD (gastroesophageal reflux disease) 05/23/2014  . Erectile dysfunction 05/22/2013  . Tobacco abuse 04/26/2012    Jones Bales, PT, DPT 01/29/2020, 4:36 PM  [facility] [address] [place], [place], [address] Phone: [phone]   Fax:  [phone]  Name: William Gibson MRN: [id number] Date of Birth: Feb 25, 1946

## 2020-01-31 ENCOUNTER — Other Ambulatory Visit: Payer: Self-pay

## 2020-01-31 ENCOUNTER — Ambulatory Visit: Payer: 59

## 2020-01-31 DIAGNOSIS — R2681 Unsteadiness on feet: Secondary | ICD-10-CM

## 2020-01-31 DIAGNOSIS — R296 Repeated falls: Secondary | ICD-10-CM | POA: Diagnosis not present

## 2020-01-31 DIAGNOSIS — M6281 Muscle weakness (generalized): Secondary | ICD-10-CM

## 2020-01-31 DIAGNOSIS — R2689 Other abnormalities of gait and mobility: Secondary | ICD-10-CM

## 2020-01-31 NOTE — Therapy (Signed)
[facility] [address] [place], [place], [address] Phone: [phone]   Fax:  [phone]  Physical Therapy Treatment/ 10th visit progress note  Patient Details  Name: William Gibson MRN: [id number] Date of Birth: [birth year] Referring Provider (PT): Reesa Chew   Encounter Date: 01/31/2020   PT End of Session - 01/31/20 1745    Visit Number 10    Number of Visits 17    Date for PT Re-Evaluation 52/77/82   90 day cert but 60 day poc   Authorization Type U[employer] and med A secondary so 10th visit progress note    PT Start Time 1615    PT Stop Time 1700    PT Time Calculation (min) 45 min    Equipment Utilized During Treatment Gait belt    Activity Tolerance Patient tolerated treatment well    Behavior During Therapy WFL for tasks assessed/performed           Past Medical History:  Diagnosis Date  . Abnormal liver function tests 12/15/2018  . Alcoholic liver disease ([place]) 12/15/2018  . Alcoholism ([place])   . Elevated LDL cholesterol level 05/22/2013  . Epistaxis   . Erectile dysfunction 05/22/2013  . Frequent falls 11/09/2018  . GERD (gastroesophageal reflux disease)   . Hyperammonemia ([place]) 11/15/2018  . Memory change 08/17/2017  . RA (rheumatoid arthritis) ([place]) 08/17/2017  . RBBB 12/15/2018  . Rheumatoid arthritis ([place]) 2010  . Shuffling gait 12/19/2018  . Tobacco abuse 04/26/2012  . Weight loss, unintentional 11/09/2018  . 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 12/15/2018    Past Surgical History:  Procedure Laterality Date  . NASAL ENDOSCOPY WITH EPISTAXIS CONTROL N/A 04/12/2018   Procedure: NASAL ENDOSCOPY WITH EPISTAXIS CONTROL;  Surgeon: Melissa Montane, MD;  Location: [facility];  Service: ENT;  Laterality: N/A;  . VASECTOMY    . VASECTOMY REVERSAL      There were no vitals filed for this visit.   Subjective Assessment - 01/31/20 1714    Subjective Pt reports that he was extremely sore Monday afternoon  after PT visit. He said he did not take arthritis meds that day. Pt recovered with rest and OTC pain killers. Pt reports being under more stress as he is supporting his brother through rehab after severe fall and hip injury. Pt reports no new falls.    Patient is accompained by: Family member   wife   Pertinent History RA, HTN, fatty liver due to EtOH abuse, gait disorder, report of increased falls x1 week    Patient Stated Goals Pt wants to gain some weight back to get stronger.    Currently in Pain? No/denies    Pain Onset More than a month ago                             [facility] Adult PT Treatment/Exercise - 01/31/20 1717      Transfers   Transfers Sit to Stand;Stand to Sit    Sit to Stand 5: Supervision    Stand to Sit 5: Supervision    Comments completed sit <> stand training with BLE's on blue mat and toss/catch 3 lb ball x 15 reps. PT providing verbal cues for adequate anterior weight shift and slow control with descent.       Neuro Re-ed    Neuro Re-ed Details  Pt was instructed in a series of dynamic balance training exercises in order to  improve SLS stability and prevent falls. Pt was instructed in alternating step marches with cues to bring knees to chest and take large steps for 60' requiring min A to prevent LOB. Pt was instructed in orange hurdle navigation (4x12") with reciprocal pattern x8 reps using min A and no AD. PT provided verbal cues to slow down and try not to touch cones with feet. Pt repeated activity with step-to side stepping alternating leading lower extremity with each rep x 4 reps. Pt with cues to slow down and take large lateral step in order to improve SLS stability. Pt then instructed in alterating cone taps on blue airex x 16 reps in which pt presented with mild instability and pt rushed through alternating taps initially. PT provided VCing to slow down to optimize balance training and muscular endurance gains. Pt then instructed in tandem walking  for 20' x 2 reps in which patient requiring min A to prevent LOB. Pt then instructed in foam balance beam navigation with normal step length x 4 reps and side steppingin alternating directions x 4 reps. PT incorporated dual tasking into foam balance beam navigation in which pt exhibited much slower exercise performance and more instability, requiring min-mod A to prevent LOB. Pt was then instructed in star drill in which pt tapped cones in SLS in multiple planes requring CGA-min A with mild impulsivity. Pt tapped 25 cones total alternating lower extremity every 5 reps. Pt was then challenged in forwards and backwards walking with ball toss/catch in which pt exhibite moderate instability and stagerring during backwards walking and decreased gait speed with both fwd and backwards walking with ball toss. Pt required CGA-min A to complete and completed 4 reps total.                   PT Education - 01/31/20 1744    Education Details Pt educated on proper heel strike and adequate hip/knee flexion during gait to prevent falls.    Person(s) Educated Patient    Methods Explanation;Demonstration    Comprehension Verbalized understanding            PT Short Term Goals - 01/09/20 1535      PT SHORT TERM GOAL #1   Title Pt will be able to perform initial HEP with supervision of family for strengthening and balance.    Baseline Patient reports compliance/independence with HEP    Time 4    Period Weeks    Status Achieved    Target Date 01/10/20      PT SHORT TERM GOAL #2   Title Pt will increase Berg from 43/56 to >47/56 for improved balance and decreased fall risk.    Baseline 43/56 on 12/11/19, 46/56 on 9/14    Time 4    Period Weeks    Status Not Met    Target Date 01/10/20      PT SHORT TERM GOAL #3   Title Pt will ambulate >500' on level surfaces without AD independently for improved household mobility and short community distances.    Baseline 620' without AD, intermittent  CGA/supervision    Time 4    Period Weeks    Status Achieved    Target Date 01/10/20      PT SHORT TERM GOAL #4   Title Pt will decreased 5 x sit to stand from 13.51 sec to <11 sec for improved functional strength and balance.    Baseline 13.51 sec from chair without hands on 12/11/19, 11.50 secs w/o UE  support    Time 4    Period Weeks    Status Not Met    Target Date 01/10/20      PT SHORT TERM GOAL #5   Title FGA will be assessed and LTG written.    Baseline FGA performed on 12/29/19 with 13/30    Time 4    Period Weeks    Status Achieved    Target Date 01/10/20             PT Long Term Goals - 12/29/19 1550      PT LONG TERM GOAL #1   Title Pt will be independent with progressive HEP for balance and strengthening to continue gains on own.    Time 8    Period Weeks    Status New      PT LONG TERM GOAL #2   Title Pt will ambulate >1000' on varied surfaces independently for improved community mobility.    Time 8    Period Weeks    Status New      PT LONG TERM GOAL #3   Title Pt will increase gait speed from 0.78m/s to >1.51m/s for improved gait safety.    Baseline 0.43m/s on 12/11/19 without AD    Time 8    Period Weeks    Status New      PT LONG TERM GOAL #4   Title Pt will increase FGA from 13/30 to >19/30 for improved gait safety and decreased fall risk.    Baseline 13/30 on 12/29/19    Time 8    Period Weeks    Status New                 Plan - 01/31/20 1746    Clinical Impression Statement Pt demos improved step length and control with ambulation. Pt exhibited good dynamic balance but performance greatly decreased with dual tasking and on compliant surfaces. Pt still with impulsivity and poor awareness of need for rest breaks. Pt continues to benefit from skilled PT to continue to work on gait and balance activities to maximize safety in community.    Personal Factors and Comorbidities Comorbidity 3+    Comorbidities RA, HTN, fatty liver due to EtOH  abuse, gait disorder, report of increased fall    Examination-Activity Limitations Stand;Locomotion Level;Transfers    Examination-Participation Restrictions Community Activity;Yard Work;Medication Management    Stability/Clinical Decision Making Evolving/Moderate complexity    Rehab Potential Good    PT Frequency 2x / week    PT Duration 8 weeks    PT Treatment/Interventions ADLs/Self Care Home Management;DME Instruction;Gait training;Stair training;Functional mobility training;Therapeutic activities;Therapeutic exercise;Balance training;Patient/family education;Neuromuscular re-education;Vestibular;Passive range of motion    PT Next Visit Plan PT to continue challenging dynamic balance training and dual tasking elements into balance training.    Consulted and Agree with Plan of Care Patient;Family member/caregiver           Patient will benefit from skilled therapeutic intervention in order to improve the following deficits and impairments:  Abnormal gait, Decreased cognition, Decreased balance, Decreased activity tolerance, Decreased strength, Decreased mobility, Decreased knowledge of use of DME, Decreased endurance  Visit Diagnosis: Other abnormalities of gait and mobility  Unsteadiness on feet  Muscle weakness (generalized)     Problem List Patient Active Problem List   Diagnosis Date Noted  . History of pancreatitis 01/12/2020  . Major depression in full remission ([place]) 01/12/2020  . Insomnia 01/12/2020  . Vascular headache   . Hyponatremia   .  History of subarachnoid hemorrhage 11/04/2019  . Head injury 11/04/2019  . Hyperlipidemia 02/06/2019  . Shuffling gait 12/19/2018  . Alcoholic liver disease ([place]) 12/15/2018  . RBBB 12/15/2018  . 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 12/15/2018  . Hyperammonemia ([place]) 11/15/2018  . Frequent falls 11/09/2018  . RA (rheumatoid arthritis) ([place]) 08/17/2017  . Memory change 08/17/2017  . Alcohol dependence in early full  remission ([place]) 12/15/2016  . Osteoarthritis 01/30/2016  . GERD (gastroesophageal reflux disease) 05/23/2014  . Erectile dysfunction 05/22/2013  . Tobacco abuse 04/26/2012    Rosalita Levan, SPT 01/31/2020, 8:18 PM    Progress Note  Reporting period 12/11/19 to 01/31/20  See Note above for Objective Data and Assessment of Progress/Goals    Cherly Anderson, PT, DPT, NCS    [facility] [address] [place], [place], [address] Phone: [phone]   Fax:  [phone]  Name: BIRAN MAYBERRY MRN: [id number] Date of Birth: November 11, 1945

## 2020-02-05 ENCOUNTER — Ambulatory Visit: Payer: 59

## 2020-02-07 ENCOUNTER — Ambulatory Visit: Payer: 59

## 2020-02-07 ENCOUNTER — Other Ambulatory Visit: Payer: Self-pay

## 2020-02-07 DIAGNOSIS — R2681 Unsteadiness on feet: Secondary | ICD-10-CM

## 2020-02-07 DIAGNOSIS — R2689 Other abnormalities of gait and mobility: Secondary | ICD-10-CM | POA: Diagnosis not present

## 2020-02-07 DIAGNOSIS — R296 Repeated falls: Secondary | ICD-10-CM | POA: Diagnosis not present

## 2020-02-07 DIAGNOSIS — M6281 Muscle weakness (generalized): Secondary | ICD-10-CM | POA: Diagnosis not present

## 2020-02-07 NOTE — Therapy (Signed)
[facility] [address] [place], [place], [address] Phone: [phone]   Fax:  [phone]  Physical Therapy Treatment/ D/C Summary  Patient Details  Name: William Gibson MRN: [id number] Date of Birth: 22-Jul-1945 Referring Provider (PT): Reesa Chew  PHYSICAL THERAPY DISCHARGE SUMMARY  Visits from Start of Care: 11  Current functional level related to goals / functional outcomes: See Clinical Impressions and Goal section for more information.    Remaining deficits: Tandem stance and SLS activities, mild cognitive deficits   Education / Equipment: Pt given HEP and educated on proper gait mechanics in order to prevent falls.  Plan: Patient agrees to discharge.  Patient goals were met. Patient is being discharged due to meeting the stated rehab goals.  ?????         Encounter Date: 02/07/2020   PT End of Session - 02/07/20 1707    Visit Number 11    Number of Visits 17    Date for PT Re-Evaluation 94/07/68   90 day cert but 60 day poc   Authorization Type [employer] and med A secondary so 10th visit progress note    PT Start Time 1615    PT Stop Time 1702    PT Time Calculation (min) 47 min    Equipment Utilized During Treatment Gait belt    Activity Tolerance Patient tolerated treatment well    Behavior During Therapy WFL for tasks assessed/performed           Past Medical History:  Diagnosis Date  . Abnormal liver function tests 12/15/2018  . Alcoholic liver disease ([place]) 12/15/2018  . Alcoholism ([place])   . Elevated LDL cholesterol level 05/22/2013  . Epistaxis   . Erectile dysfunction 05/22/2013  . Frequent falls 11/09/2018  . GERD (gastroesophageal reflux disease)   . Hyperammonemia ([place]) 11/15/2018  . Memory change 08/17/2017  . RA (rheumatoid arthritis) ([place]) 08/17/2017  . RBBB 12/15/2018  . Rheumatoid arthritis ([place]) 2010  . Shuffling gait 12/19/2018  . Tobacco abuse 04/26/2012  . Weight loss,  unintentional 11/09/2018  . 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 12/15/2018    Past Surgical History:  Procedure Laterality Date  . NASAL ENDOSCOPY WITH EPISTAXIS CONTROL N/A 04/12/2018   Procedure: NASAL ENDOSCOPY WITH EPISTAXIS CONTROL;  Surgeon: Melissa Montane, MD;  Location: [facility];  Service: ENT;  Laterality: N/A;  . VASECTOMY    . VASECTOMY REVERSAL      There were no vitals filed for this visit.   Subjective Assessment - 02/07/20 1625    Subjective Pt reports no changes since last visit. Pt reports no new falls. Pt reports minimal knee pain and on bottom of feet.    Patient is accompained by: Family member   wife   Pertinent History RA, HTN, fatty liver due to EtOH abuse, gait disorder, report of increased falls x1 week    Patient Stated Goals Pt wants to gain some weight back to get stronger.    Currently in Pain? Yes    Pain Score 3     Pain Location Knee    Pain Orientation Right;Left    Pain Descriptors / Indicators Aching    Pain Onset More than a month ago    Multiple Pain Sites Yes    Pain Score 3    Pain Location Foot    Pain Orientation Left;Right    Pain Descriptors / Indicators Sore  [facility] PT Assessment - 02/07/20 1639      Transfers   Transfers Sit to Stand;Stand to Sit    Sit to Stand 7: Independent    Five time sit to stand comments  6 sec without hands from mat table    Stand to Sit 7: Independent      Functional Gait  Assessment   Gait assessed  Yes    Gait Level Surface Walks 20 ft in less than 7 sec but greater than 5.5 sec, uses assistive device, slower speed, mild gait deviations, or deviates 6-10 in outside of the 12 in walkway width.    Change in Gait Speed Able to smoothly change walking speed without loss of balance or gait deviation. Deviate no more than 6 in outside of the 12 in walkway width.    Gait with Horizontal Head Turns Performs head turns smoothly with slight change in gait velocity (eg, minor disruption to  smooth gait path), deviates 6-10 in outside 12 in walkway width, or uses an assistive device.    Gait with Vertical Head Turns Performs head turns with no change in gait. Deviates no more than 6 in outside 12 in walkway width.    Gait and Pivot Turn Pivot turns safely within 3 sec and stops quickly with no loss of balance.    Step Over Obstacle Is able to step over 2 stacked shoe boxes taped together (9 in total height) without changing gait speed. No evidence of imbalance.    Gait with Narrow Base of Support Ambulates 4-7 steps.    Gait with Eyes Closed Walks 20 ft, no assistive devices, good speed, no evidence of imbalance, normal gait pattern, deviates no more than 6 in outside 12 in walkway width. Ambulates 20 ft in less than 7 sec.    Ambulating Backwards Walks 20 ft, no assistive devices, good speed, no evidence for imbalance, normal gait    Steps Alternating feet, no rail.    Total Score 26    FGA comment: Pt had difficulty with tandem walking with 2 staggers to the R and 1 lateral LOB to the R.                         [facility] Adult PT Treatment/Exercise - 02/07/20 1630      Transfers   Transfers Sit to Stand;Stand to Sit    Sit to Stand 7: Independent    Stand to Sit 7: Independent      Ambulation/Gait   Ambulation/Gait Yes    Ambulation/Gait Assistance 5: Supervision    Ambulation/Gait Assistance Details Pt completed ambulation for 1,000 ft using supervision and verbal cueing to slow down and focus on picking feet up.     Ambulation Distance (Feet) 1000 Feet    Assistive device None    Gait Pattern Step-through pattern;Decreased dorsiflexion - right;Decreased dorsiflexion - left   decreased bil foot clearance   Ambulation Surface Level;Indoor      Balance   Balance Assessed Yes      Berg Balance Test   Sit to Stand Able to stand without using hands and stabilize independently    Standing Unsupported Able to stand safely 2 minutes    Sitting with Back  Unsupported but Feet Supported on Floor or Stool Able to sit safely and securely 2 minutes    Stand to Sit Sits safely with minimal use of hands    Transfers Able to transfer safely, minor use of hands  Standing Unsupported with Eyes Closed Able to stand 10 seconds safely    Standing Ubsupported with Feet Together Able to place feet together independently and stand 1 minute safely    From Standing, Reach Forward with Outstretched Arm Can reach confidently >25 cm (10")    From Standing Position, Pick up Object from Floor Able to pick up shoe safely and easily    From Standing Position, Turn to Look Behind Over each Shoulder Looks behind from both sides and weight shifts well    Turn 360 Degrees Able to turn 360 degrees safely in 4 seconds or less    Standing Unsupported, Alternately Place Feet on Step/Stool Able to stand independently and safely and complete 8 steps in 20 seconds    Standing Unsupported, One Foot in Front Able to plae foot ahead of the other independently and hold 30 seconds    Standing on One Leg Able to lift leg independently and hold equal to or more than 3 seconds    Total Score 53                  PT Education - 02/07/20 1706    Education Details Pt educated on picking feet up and taking his time durig ambulation. Pt educated on progress towards goals.    Person(s) Educated Patient    Methods Explanation    Comprehension Verbalized understanding            PT Short Term Goals - 02/07/20 1731      PT SHORT TERM GOAL #1   Title Pt will be able to perform initial HEP with supervision of family for strengthening and balance.    Baseline Patient reports compliance/independence with HEP    Time 4    Period Weeks    Status Achieved    Target Date 01/10/20      PT SHORT TERM GOAL #2   Title Pt will increase Berg from 43/56 to >47/56 for improved balance and decreased fall risk.    Baseline 53/56 at D/C. 02/07/20    Time 4    Period Weeks    Status  Achieved    Target Date 01/10/20      PT SHORT TERM GOAL #3   Title Pt will ambulate >500' on level surfaces without AD independently for improved household mobility and short community distances.    Baseline 1000' without AD and intermittent supervision    Time 4    Period Weeks    Status Achieved    Target Date 01/10/20      PT SHORT TERM GOAL #4   Title Pt will decreased 5 x sit to stand from 13.51 sec to <11 sec for improved functional strength and balance.    Baseline 6 s w/o UE support    Time 4    Period Weeks    Status Achieved    Target Date 01/10/20      PT SHORT TERM GOAL #5   Title FGA will be assessed and LTG written.    Baseline FGA performed on 12/29/19 with 13/30    Time 4    Period Weeks    Status Achieved    Target Date 01/10/20             PT Long Term Goals - 02/07/20 1733      PT LONG TERM GOAL #1   Title Pt will be independent with progressive HEP for balance and strengthening to continue gains on own.  Baseline Pt reported that he is working on [employer] consistently.    Time 8    Period Weeks    Status Achieved      PT LONG TERM GOAL #2   Title Pt will ambulate >1000' on varied surfaces independently for improved community mobility.    Baseline >1000' on varied surfaces with intermittent supervision    Time 8    Period Weeks    Status Partially Met      PT LONG TERM GOAL #3   Title Pt will increase gait speed from 0.69ms to >1.129m for improved gait safety.    Baseline 1.25 m/s w/o AD 02/07/20    Time 8    Period Weeks    Status Achieved      PT LONG TERM GOAL #4   Title Pt will increase FGA from 13/30 to >19/30 for improved gait safety and decreased fall risk.    Baseline 26/30 on 02/07/20    Time 8    Period Weeks    Status Achieved                 Plan - 02/07/20 1710    Clinical Impression Statement Pt has made excellent progress throughout POC. Pt has met all goals at this time. Pt has demonstrated excellent improvement  in static and dynamic balance, increasing score on the Berg from 43/56 to 53/56 with only remaining deficits being tandem and SLS. Pt has improved functional LE strength and endurance as evidenced by the 5xSTS in which pt decreased time from 14 s to 6 s from mat table without use of arms. Pt increased gait speed from 0.9 m/s to 1.25 m/s. Pt improved his dynamic gait and higher-level balance as evidenced by the FGA from 13/30 to 26/30. Pt discharging as goals met and feels he can continue on own at this time.    Personal Factors and Comorbidities Comorbidity 3+    Comorbidities RA, HTN, fatty liver due to EtOH abuse, gait disorder, report of increased fall    Examination-Activity Limitations Stand;Locomotion Level;Transfers    Examination-Participation Restrictions Community Activity;Yard Work;Medication Management    Stability/Clinical Decision Making Evolving/Moderate complexity    Rehab Potential Good    PT Frequency 2x / week    PT Duration 8 weeks    PT Treatment/Interventions ADLs/Self Care Home Management;DME Instruction;Gait training;Stair training;Functional mobility training;Therapeutic activities;Therapeutic exercise;Balance training;Patient/family education;Neuromuscular re-education;Vestibular;Passive range of motion    PT Next Visit Plan Pt D/C today 02/07/20.    Consulted and Agree with Plan of Care Patient           Patient will benefit from skilled therapeutic intervention in order to improve the following deficits and impairments:  Abnormal gait, Decreased cognition, Decreased balance, Decreased activity tolerance, Decreased strength, Decreased mobility, Decreased knowledge of use of DME, Decreased endurance  Visit Diagnosis: Other abnormalities of gait and mobility  Unsteadiness on feet     Problem List Patient Active Problem List   Diagnosis Date Noted  . History of pancreatitis 01/12/2020  . Major depression in full remission ([facility]/17/2021  . Insomnia 01/12/2020   . Vascular headache   . Hyponatremia   . History of subarachnoid hemorrhage 11/04/2019  . Head injury 11/04/2019  . Hyperlipidemia 02/06/2019  . Shuffling gait 12/19/2018  . Alcoholic liver disease ([facility]/20/2020  . RBBB 12/15/2018  . 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 12/15/2018  . Hyperammonemia ([facility]/21/2020  . Frequent falls 11/09/2018  . RA (rheumatoid arthritis) ([place]  08/17/2017  . Memory change 08/17/2017  . Alcohol dependence in early full remission ([place]) 12/15/2016  . Osteoarthritis 01/30/2016  . GERD (gastroesophageal reflux disease) 05/23/2014  . Erectile dysfunction 05/22/2013  . Tobacco abuse 04/26/2012    Rosalita Levan, SPT 02/08/2020, 8:34 AM  [facility] [address] [place], [place], [address] Phone: [phone]   Fax:  [phone]  Name: William Gibson MRN: [id number] Date of Birth: [birth year]

## 2020-02-12 ENCOUNTER — Ambulatory Visit: Payer: 59

## 2020-02-14 ENCOUNTER — Ambulatory Visit: Payer: 59

## 2020-02-16 ENCOUNTER — Other Ambulatory Visit: Payer: Self-pay

## 2020-02-16 ENCOUNTER — Encounter (HOSPITAL_BASED_OUTPATIENT_CLINIC_OR_DEPARTMENT_OTHER): Payer: 59 | Admitting: Physical Medicine and Rehabilitation

## 2020-02-16 ENCOUNTER — Encounter: Payer: Self-pay | Admitting: Physical Medicine and Rehabilitation

## 2020-02-16 VITALS — BP 149/87 | HR 101 | Temp 97.0°F | Ht 68.0 in | Wt 160.8 lb

## 2020-02-16 DIAGNOSIS — R2689 Other abnormalities of gait and mobility: Secondary | ICD-10-CM | POA: Diagnosis not present

## 2020-02-16 DIAGNOSIS — R42 Dizziness and giddiness: Secondary | ICD-10-CM | POA: Diagnosis not present

## 2020-02-16 DIAGNOSIS — F1020 Alcohol dependence, uncomplicated: Secondary | ICD-10-CM

## 2020-02-16 DIAGNOSIS — F17210 Nicotine dependence, cigarettes, uncomplicated: Secondary | ICD-10-CM | POA: Diagnosis not present

## 2020-02-16 DIAGNOSIS — G911 Obstructive hydrocephalus: Secondary | ICD-10-CM | POA: Diagnosis not present

## 2020-02-16 DIAGNOSIS — S069X0S Unspecified intracranial injury without loss of consciousness, sequela: Secondary | ICD-10-CM | POA: Diagnosis not present

## 2020-02-16 DIAGNOSIS — F1011 Alcohol abuse, in remission: Secondary | ICD-10-CM | POA: Diagnosis not present

## 2020-02-16 DIAGNOSIS — S0990XD Unspecified injury of head, subsequent encounter: Secondary | ICD-10-CM | POA: Diagnosis not present

## 2020-02-16 DIAGNOSIS — H918X1 Other specified hearing loss, right ear: Secondary | ICD-10-CM | POA: Diagnosis not present

## 2020-02-16 NOTE — Progress Notes (Signed)
Subjective:     Patient ID: William Gibson, male   DOB: [birth year], 74 y.o.   MRN: [id number]  HPI  William Gibson presents for dizzy spells. He has not had any in the last week. It was present when he was rising from a seated position.   He went to his rheum doctor last week and he got a shot of Toradol and al the joint pain went away. He takes Meloxicam but is not helping enough.  He drinks half a gallon of mild per day. In between meals he eats cookies. He is back to 160 lbs from 140 lbs when he left he hospital.  He as been very careful outside and has not had falls.  He has graduated therapy!  Pain Inventory Average Pain 0 Pain Right Now 0 My pain is No pain today here for follow up dizzy spells.  In the last 24 hours, has pain interfered with the following? General activity 0 Relation with others 0 Enjoyment of life 0 What TIME of day is your pain at its worst? morning  Sleep (in general) Good  Pain is worse with: walking and some activites Pain improves with: rest and pacing activities Relief from Meds: good  Family History  Problem Relation Age of Onset  . Lung cancer Father   . Heart disease Mother   . CAD Brother   . Aneurysm Brother   . Colon cancer Neg Hx   . Esophageal cancer Neg Hx   . Stomach cancer Neg Hx    Social History   Socioeconomic History  . Marital status: Married    Spouse name: Not on file  . Number of children: Not on file  . Years of education: Not on file  . Highest education level: Not on file  Occupational History  . Not on file  Tobacco Use  . Smoking status: Current Every Day Smoker    Packs/day: 1.50    Years: 58.00    Pack years: 87.00    Types: Cigarettes  . Smokeless tobacco: Never Used  Vaping Use  . Vaping Use: Never used  Substance and Sexual Activity  . Alcohol use: Not Currently    Comment: 1/5 whiskey a day prior  . Drug use: No  . Sexual activity: Not Currently  Other Topics Concern  . Not on file  Social  History Narrative   Married. 3 children. 8 grandkids. 1 greatgrandkid.       Retired from being self employed Psychologist, counselling         Does not have a living will.   Desires CPR, does not want prolonged life support if futile.   Social Determinants of Health   Financial Resource Strain:   . Difficulty of Paying Living Expenses: Not on file  Food Insecurity:   . Worried About Charity fundraiser in the Last Year: Not on file  . Ran Out of Food in the Last Year: Not on file  Transportation Needs:   . Lack of Transportation (Medical): Not on file  . Lack of Transportation (Non-Medical): Not on file  Physical Activity:   . Days of Exercise per Week: Not on file  . Minutes of Exercise per Session: Not on file  Stress:   . Feeling of Stress : Not on file  Social Connections:   . Frequency of Communication with Friends and Family: Not on file  . Frequency of Social Gatherings with Friends and Family: Not on file  . Attends Religious  Services: Not on file  . Active Member of Clubs or Organizations: Not on file  . Attends Archivist Meetings: Not on file  . Marital Status: Not on file   Past Surgical History:  Procedure Laterality Date  . NASAL ENDOSCOPY WITH EPISTAXIS CONTROL N/A 04/12/2018   Procedure: NASAL ENDOSCOPY WITH EPISTAXIS CONTROL;  Surgeon: Melissa Montane, MD;  Location: [facility];  Service: ENT;  Laterality: N/A;  . VASECTOMY    . VASECTOMY REVERSAL     Past Surgical History:  Procedure Laterality Date  . NASAL ENDOSCOPY WITH EPISTAXIS CONTROL N/A 04/12/2018   Procedure: NASAL ENDOSCOPY WITH EPISTAXIS CONTROL;  Surgeon: Melissa Montane, MD;  Location: [facility];  Service: ENT;  Laterality: N/A;  . VASECTOMY    . VASECTOMY REVERSAL     Past Medical History:  Diagnosis Date  . Abnormal liver function tests 12/15/2018  . Alcoholic liver disease ([place]) 12/15/2018  . Alcoholism ([place])   . Elevated LDL cholesterol level 05/22/2013  . Epistaxis   . Erectile dysfunction  05/22/2013  . Frequent falls 11/09/2018  . GERD (gastroesophageal reflux disease)   . Hyperammonemia ([place]) 11/15/2018  . Memory change 08/17/2017  . RA (rheumatoid arthritis) ([place]) 08/17/2017  . RBBB 12/15/2018  . Rheumatoid arthritis ([place]) 2010  . Shuffling gait 12/19/2018  . Tobacco abuse 04/26/2012  . Weight loss, unintentional 11/09/2018  . 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 12/15/2018   BP (!) 149/87   Pulse (!) 101   Temp (!) 97 F (36.1 C)   Ht 5\' 8"  (1.727 m)   Wt 160 lb 12.8 oz (72.9 kg)   BMI 24.45 kg/m   Opioid Risk Score:   Fall Risk Score:  `1  Depression screen PHQ 2/9  Depression screen [facility] 2/9 02/16/2020 12/06/2019 11/16/2018 06/01/2017 01/30/2016 05/22/2013 05/22/2013  Decreased Interest 0 0 0 0 0 0 0  Down, Depressed, Hopeless 0 0 0 0 0 0 0  PHQ - 2 Score 0 0 0 0 0 0 0  Altered sleeping - 3 - - - - -  Tired, decreased energy - 1 - - - - -  Change in appetite - 0 - - - - -  Feeling bad or failure about yourself  - 0 - - - - -  Trouble concentrating - 1 - - - - -  Moving slowly or fidgety/restless - 1 - - - - -  Suicidal thoughts - 0 - - - - -  PHQ-9 Score - 6 - - - - -   Review of Systems  Constitutional: Negative.   HENT: Negative.   Eyes: Negative.   Respiratory: Negative.   Cardiovascular: Negative.   Gastrointestinal: Negative.   Endocrine: Negative.   Genitourinary: Negative.   Musculoskeletal: Negative.   Skin: Negative.   Allergic/Immunologic: Negative.   Neurological: Negative.   Hematological: Negative.   Psychiatric/Behavioral: Negative.   All other systems reviewed and are negative.      Objective:   Physical Exam Gen: no distress, normal appearing HEENT: oral mucosa pink and moist, NCAT Cardio: Reg rate Chest: normal effort, normal rate of breathing Abd: soft, non-distended Ext: no edema Skin: intact Neuro: Alert and oriented x3 Musculoskeletal: 5/5 strength throughout Psych: pleasant, normal affect    Assessment:         Plan:     William Gibson presents for follow-up of of TBI   Dizzy spells: -resolved over the past week. -Was present when he was lying flat and  raised to a seated position.  -Discussed that this was likely orthostasis and to be slow during changes in position.  -Encouraged drinking 6-8 glasses of water per day. He has been drinking a lot of milk.   TBI: -Denies falls -Has graduated from therapy!  Mood: -very positive!  -has been working outdoors a lot.   Family support:  -Has excellent family support from his wife.   Alcoholism: -Has no used alcohol since fall in summer

## 2020-02-19 ENCOUNTER — Ambulatory Visit: Payer: 59

## 2020-02-21 ENCOUNTER — Ambulatory Visit: Payer: 59

## 2020-02-26 ENCOUNTER — Other Ambulatory Visit: Payer: Self-pay

## 2020-02-26 ENCOUNTER — Ambulatory Visit: Payer: 59 | Attending: Physical Medicine and Rehabilitation | Admitting: Audiologist

## 2020-02-26 DIAGNOSIS — H903 Sensorineural hearing loss, bilateral: Secondary | ICD-10-CM | POA: Insufficient documentation

## 2020-02-26 NOTE — Procedures (Signed)
°  Outpatient Audiology and [facility] [place], [place]  [address] [phone]  AUDIOLOGICAL  EVALUATION  NAME: William Gibson     DOB:   10/10/1945      MRN: [id number]                                                                                     DATE: 02/26/2020     REFERENT: Marin Olp, MD STATUS: Outpatient DIAGNOSIS: Sensory hearing loss, bilateral    History: Shin was seen for an audiological evaluation.  Kaiser is receiving a hearing evaluation due to concerns for hearing loss worsening after a recent head injury. Rivers denies difficulty hearing. He says he had a previous hearing test which showed some mild hearing loss but hearing aids were not recommended. No pain or pressure reported in either ear. No tinnitus present in either ear. Lennox has a history of noise exposure from working with power tools.  Medical history positive for head injury and memory changes which are a risk factor for hearing loss. No other relevant case history reported.   Evaluation:   Otoscopy showed a clear view of the tympanic membranes, bilaterally  Tympanometry results were consistent with normal middle ear function, bilaterally    Audiometric testing was completed using conventional audiometry with insert transducer. Speech Recognition Thresholds were consistent with pure tone averages. Word Recognition was excellent at an elevated level. Pure tone thresholds show moderate sloping to profound sensorineural hearing loss in both ears. Test results are consistent with symmetric severe hearing loss that likely impedes daily communication. Hearing is symmetric and sensorineural, ENT Physician clearance not necessary.   Results:  The test results were reviewed with Elenore Rota. He has severe hearing loss in both ears. He still has good ability to understand speech when the volume is elevated, which makes him a good hearing aid candidate. Hearing aids are also  beneficial for preventing falls because they increase spatial awareness. Treating hearing loss has also been shown to help slow the progression of cognitive changes.  Dsean has been mailed a copy of his report and audiogram. This test can be used for 6 months to program a hearing aid, after that time a new test would be necessary. Hearing aids are strongly encouraged and need to be worn regularly for maximum benefit. It is recommended that Elenore Rota see a professional to verify that the hearing aids are appropriately fit, he was given a list of audiologists in the area who dispense hearing aids. An over the counter or mail order hearing device is not appropriate for Antwon's current level of hearing loss.   Recommendations: 1. Amplification is necessary for both ears. Hearing aids can be purchased from a variety of locations. See provided list for locations in the [place] area.   Alfonse Alpers  Audiologist, Au.D., CCC-A 02/26/2020  11:52 AM  Cc: Marin Olp, MD

## 2020-03-15 ENCOUNTER — Ambulatory Visit (HOSPITAL_COMMUNITY): Admit: 2020-03-15 | Discharge status: Against Medical Advice | Payer: Self-pay

## 2020-03-15 ENCOUNTER — Other Ambulatory Visit: Payer: Self-pay | Admitting: Physical Medicine and Rehabilitation

## 2020-04-10 ENCOUNTER — Telehealth: Payer: Self-pay

## 2020-04-10 NOTE — Telephone Encounter (Signed)
Unable to get in contact with the patient due to his voicemail being full. I have reached out to the patient via mychart about his orders.

## 2020-04-10 NOTE — Telephone Encounter (Signed)
-----   Message from Marin Olp, MD sent at 04/10/2020  8:46 AM EST ----- Regarding: RE: Cancellation of Order # [id number] Reaching out again- do not see documentation of phone calls or letter. Please see below note  ----- Message ----- From: Marin Olp, MD Sent: 03/24/2020   8:09 PM EST To: Marin Olp, MD, Dione Housekeeper Subject: RE: Cancellation of Order # [id number]          Back in September there was this note"Anemia noted. Team please repeat CBC under anemia unspecified in 2 weeks and also have him submit stool cards-want to make sure no blood in the stool"  Several attempts were made ut he was not reached I do not believe- we need to call any available # for them and if that doesn't work we need to write him a letter and ask him to contact us.   Thanks, Garret Reddish  ----- Message ----- From: Armando Gang Job Sent: 03/22/2020   5:37 AM EST To: Marin Olp, MD Subject: Cancellation of Order # [id number]              Order number [id number] for the procedure FECAL OCCULT BLOOD,  IMMUNOCHEMICAL [[id number]] has been canceled by [employer]te  [[email or web address]]. This procedure was ordered by Marin Olp, MD  [[id number]] on Jan 16, 2020 for the patient William Gibson  [[id number]]. The reason for cancellation was "Order Expired".  This was a future order.

## 2020-04-11 NOTE — Progress Notes (Signed)
Phone [phone] In person visit   Subjective:   William Gibson is a 74 y.o. year old very pleasant male patient who presents for/with See problem oriented charting Chief Complaint  Patient presents with  . Depression  . Insomnia   This visit occurred during the SARS-CoV-2 public health emergency.  Safety protocols were in place, including screening questions prior to the visit, additional usage of staff PPE, and extensive cleaning of exam room while observing appropriate contact time as indicated for disinfecting solutions.   Past Medical History-  Patient Active Problem List   Diagnosis Date Noted  . History of subarachnoid hemorrhage 11/04/2019    Priority: High  . Head injury 11/04/2019    Priority: High  . Alcoholic liver disease ([place]) 12/15/2018    Priority: High  . 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 12/15/2018    Priority: High  . RA (rheumatoid arthritis) ([place]) 08/17/2017    Priority: High  . Alcohol dependence in early full remission ([place]) 12/15/2016    Priority: High  . Tobacco abuse 04/26/2012    Priority: High  . Major depression in full remission ([place]) 01/12/2020    Priority: Medium  . Insomnia 01/12/2020    Priority: Medium  . Vascular headache     Priority: Medium  . Hyperlipidemia 02/06/2019    Priority: Medium  . Shuffling gait 12/19/2018    Priority: Medium  . Frequent falls 11/09/2018    Priority: Medium  . GERD (gastroesophageal reflux disease) 05/23/2014    Priority: Medium  . History of pancreatitis 01/12/2020    Priority: Low  . Hyponatremia     Priority: Low  . RBBB 12/15/2018    Priority: Low  . Hyperammonemia ([place]) 11/15/2018    Priority: Low  . Memory change 08/17/2017    Priority: Low  . Osteoarthritis 01/30/2016    Priority: Low  . Erectile dysfunction 05/22/2013    Priority: Low    Medications- reviewed and updated Current Outpatient Medications  Medication Sig Dispense Refill  . CIMZIA PREFILLED 2 X 200  MG/ML KIT Inject into the skin.    . Cyanocobalamin (VITAMIN B-12 PO) Take 1 tablet by mouth daily.    . folic acid (FOLVITE) 1 MG tablet Take 1 tablet (1 mg total) by mouth daily. 30 tablet 0  . meloxicam (MOBIC) 15 MG tablet Take 15 mg by mouth daily.    Marland Kitchen omeprazole (PRILOSEC) 20 MG capsule Take 20 mg by mouth daily. OTC- transitioning from protonix $RemoveBefor'40mg'[id number]$     . thiamine (VITAMIN B-1) 100 MG tablet Take 1 tablet (100 mg total) by mouth daily. 30 tablet 0  . traZODone (DESYREL) 50 MG tablet Take 1 tablet (50 mg total) by mouth at bedtime. 90 tablet 3   No current facility-administered medications for this visit.     Objective:  BP (!) 141/88   Pulse 96   Temp 98.5 F (36.9 C) (Temporal)   Resp 18   Ht $R'5\' 8"'yS$  (1.727 m)   Wt 169 lb (76.7 kg)   SpO2 96%   BMI 25.70 kg/m  Gen: NAD, resting comfortably CV: RRR no murmurs rubs or gallops Lungs: CTAB no crackles, wheeze, rhonchi Ext: no edema Skin: warm, dry     Assessment and Plan    #Alcoholism in remission-patient's last heavy drinking remains November 03, 2019-ended up with major fall and head trauma/skull fracture/subarachnoid hemorrhage at that time.  Health is much improved since quitting drinking - he started back with some social drinking  3 weeks ago-  max of 2  -With severity of prior drinking issues I recommended full cessation and encouraged him to follow-up with AA.  He feels like he can quit on his own but I told him I thought it would be valuable to have the accountability plus I think he could help someone else in the future -Appears stable and in remission but would prefer to keep this in full remission with full cessation as above  #Alcoholic liver disease-thankfully LFTs remain well controlled.  Still need to avoid alcohol intake.  Continue to monitor Hepatic Function Latest Ref Rng & Units 04/12/2020 01/12/2020 11/13/2019  Total Protein 6.1 - 8.1 g/dL 8.1 6.8 7.8  Albumin 3.5 - 5.0 g/dL - - 3.0(L)  AST 10 - 35 U/L 16 11  29   ALT 9 - 46 U/L 11 15 33  Alk Phosphatase 38 - 126 U/L - - 76  Total Bilirubin 0.2 - 1.2 mg/dL 0.2 0.3 0.8  Bilirubin, Direct 0.0 - 0.3 mg/dL - - -     # Depression/insomnia S: Medication: last visit Lexapro 10 mg and Remeron 15 mg- he has stopped since last visit.  Also on trazodone for sleep which seems to really help-he is continue that Depression screen [facility] 2/9 04/12/2020 04/12/2020 02/16/2020  Decreased Interest 0 0 0  Down, Depressed, Hopeless 0 0 0  PHQ - 2 Score 0 0 0  Altered sleeping 0 - -  Tired, decreased energy 0 - -  Change in appetite 0 - -  Feeling bad or failure about yourself  0 - -  Trouble concentrating 0 - -  Moving slowly or fidgety/restless 0 - -  Suicidal thoughts 0 - -  PHQ-9 Score 0 - -  A/P: patient doing well without lexapro and remeron-he stopped on his own. Only using sparing trazodone for sleep/insomnia with reasonable control-continue current medication. Depression in full remision with PHQ-9 under 5- continue to monitor.   # GERD S:Medication: Prilosec 20Mg  intermittently down from 40 mg last visit of Protonix.  Reasonable control A/P: Doing well-discussed possibly stepping down to Pepcid . From avs "Pepcid has a better long term safety profile - see if that will control your reflux symptoms as well- can take up to twice a day- if not controlling symptoms then can go back to prilosec "  # Rheumatoid Arthritis-follows with Dr. Trudie Reed S: Patient had injection every 2 weeks- 2 shots at a time- he gets mild redness around injection spots.   Remains on meloxicam 15 mg daily as well  Also had steroid injection in right buttocks and developed a rash the next day (encouraged him to tell rheumatology. Does not recall names of q2 week injection or right buttocks ijectoin.   A/P: poor control- on new injections he is not sure of name of- hoping he sees improvement in coming weeks  -with ongoing nsaid use from rheumatology- need to check kidney function  every 6 months  #Elevated blood pressure reading S: medication: None Home readings #s: Has not checked recently BP Readings from Last 3 Encounters:  04/12/20 (!) 141/88  02/16/20 (!) 149/87  01/26/20 127/80  A/P: Well-controlled on last visit back in September.  Mildly elevated today.  We discussed home monitoring per AVS "Please monitor blood pressure at home- goal <135/85 on home readings. Check daily for next 2-3 weeks and send me results"  #Tobacco abuse-recommended full cessation-patient not ready to quit Recommended follow up: Return in about 6 months (around 10/11/2020) for physical  or sooner if needed. Future Appointments  Date Time Provider [place]  10/21/2020  4:00 PM Marin Olp, MD [facility]    Lab/Order associations:   ICD-10-CM   1. Gastroesophageal reflux disease, unspecified whether esophagitis present  K21.9   2. Mixed hyperlipidemia  E78.2 CBC With Differential/Platelet    COMPLETE METABOLIC PANEL WITH GFR    COMPLETE METABOLIC PANEL WITH GFR    CBC With Differential/Platelet  3. Alcoholic liver disease ([place])  K70.9   4. Alcohol dependence in early full remission ([place])  F10.21   5. Major depressive disorder with single episode, in full remission ([place])  F32.5   6. Insomnia, unspecified type  G47.00     No orders of the defined types were placed in this encounter.   Return precautions advised.  Garret Reddish, MD

## 2020-04-11 NOTE — Patient Instructions (Addendum)
Health Maintenance Due  Topic Date Due  . COVID-19 Vaccine (1) Not sure of the dates of the first 2 doses. Please find those doses and send to [place] Never done  . TETANUS/TDAP Cheaper to receive at your local pharmacy. Never done  . Pneumovax 23 today Never done   Pepcid has a better long term safety profile - see if that will control your reflux symptoms as well- can take up to twice a day- if not controlling symptoms then can go back to prilosec  Please monitor blood pressure at home- goal <135/85 on home readings. Check daily for next 2-3 weeks and send me results  Would encourage quitting smoking and completely avoiding alcohol given prior history  Please stop by lab before you go  If you have mychart- we will send your results within 3 business days of [place] receiving them.  If you do not have mychart- we will call you about results within 5 business days of [place] receiving them.  *please note we are currently using [facility] which has a longer processing time than [place] typically so labs may not come back as quickly as in the past *please also note that you will see labs on mychart as soon as they post. I will later go in and write notes on them- will say "notes from Dr. Yong Channel"

## 2020-04-12 ENCOUNTER — Other Ambulatory Visit: Payer: Self-pay

## 2020-04-12 ENCOUNTER — Encounter: Payer: Self-pay | Admitting: Family Medicine

## 2020-04-12 ENCOUNTER — Ambulatory Visit (INDEPENDENT_AMBULATORY_CARE_PROVIDER_SITE_OTHER): Payer: 59 | Admitting: Family Medicine

## 2020-04-12 ENCOUNTER — Ambulatory Visit (INDEPENDENT_AMBULATORY_CARE_PROVIDER_SITE_OTHER): Payer: 59

## 2020-04-12 VITALS — BP 141/88 | HR 96 | Temp 98.5°F | Resp 18 | Ht 68.0 in | Wt 169.0 lb

## 2020-04-12 DIAGNOSIS — K709 Alcoholic liver disease, unspecified: Secondary | ICD-10-CM | POA: Diagnosis not present

## 2020-04-12 DIAGNOSIS — F1021 Alcohol dependence, in remission: Secondary | ICD-10-CM | POA: Diagnosis not present

## 2020-04-12 DIAGNOSIS — E782 Mixed hyperlipidemia: Secondary | ICD-10-CM

## 2020-04-12 DIAGNOSIS — K219 Gastro-esophageal reflux disease without esophagitis: Secondary | ICD-10-CM

## 2020-04-12 DIAGNOSIS — G4700 Insomnia, unspecified: Secondary | ICD-10-CM | POA: Diagnosis not present

## 2020-04-12 DIAGNOSIS — F325 Major depressive disorder, single episode, in full remission: Secondary | ICD-10-CM

## 2020-04-12 DIAGNOSIS — Z23 Encounter for immunization: Secondary | ICD-10-CM | POA: Diagnosis not present

## 2020-04-12 DIAGNOSIS — Z0000 Encounter for general adult medical examination without abnormal findings: Secondary | ICD-10-CM

## 2020-04-12 NOTE — Progress Notes (Addendum)
Virtual Visit via Telephone Note  I connected with  Particia Gibson on 04/12/20 at 11:00 AM EST by telephone and verified that I am speaking with the correct person using two identifiers.  Medicare Annual Wellness visit completed telephonically due to Covid-19 pandemic.   Persons participating in this call: This Health Coach and this patients wife William Gibson, stating he was unable to take the call. Questions answered by both.  Location: Patient: Home Provider: Office   I discussed the limitations, risks, security and privacy concerns of performing an evaluation and management service by telephone and the availability of in person appointments. The patient expressed understanding and agreed to proceed.  Unable to perform video visit due to video visit attempted and failed and/or patient does not have video capability.   Some vital signs may be absent or patient reported.   William Brace, LPN    Subjective:   William Gibson is a 74 y.o. male who presents for Medicare Annual/Subsequent preventive examination.  Review of Systems     Cardiac Risk Factors include: advanced age (>9mn, >>59women);male gender     Objective:    Today's Vitals   04/12/20 1007  PainSc: 3    There is no height or weight on file to calculate BMI.  Advanced Directives 04/12/2020 12/11/2019 11/10/2019 11/09/2019 11/04/2019 10/23/2019 07/28/2019  Does Patient Have a Medical Advance Directive? _0  Yes Yes  Type of [place] Healthcare Power of [place] will Living will  Does patient want to make changes to medical advance directive? - - No - Patient declined No - Patient declined - - No - Patient declined  Copy of [place] Chart? Yes - validated most recent copy scanned in chart (See row information) - No -  copy requested No - copy requested No - copy requested - -  Would patient like information on creating a medical advance directive? - - - - - - No - Patient declined    Current Medications (verified) Outpatient Encounter Medications as of 04/12/2020  Medication Sig   CIMZIA PREFILLED 2 X 200 MG/ML KIT Inject into the skin.   Cyanocobalamin (VITAMIN B-12 PO) Take 1 tablet by mouth daily.   folic acid (FOLVITE) 1 MG tablet Take 1 tablet (1 mg total) by mouth daily.   meloxicam (MOBIC) 15 MG tablet Take 15 mg by mouth daily.   omeprazole (PRILOSEC) 20 MG capsule Take 20 mg by mouth daily. OTC- transitioning from protonix 422m  thiamine (VITAMIN B-1) 100 MG tablet Take 1 tablet (100 mg total) by mouth daily.   traZODone (DESYREL) 50 MG tablet Take 1 tablet (50 mg total) by mouth at bedtime.   [DISCONTINUED] escitalopram (LEXAPRO) 10 MG tablet Take 1 tablet (10 mg total) by mouth daily. (Patient not taking: Reported on 04/12/2020)   [DISCONTINUED] mirtazapine (REMERON) 7.5 MG tablet Take 7.5 mg by mouth at bedtime. (Patient not taking: Reported on 04/12/2020)   No facility-administered encounter medications on file as of 04/12/2020.    Allergies (verified) Patient has no known allergies.   History: Past Medical History:  Diagnosis Date   Abnormal liver function tests 11/28/95/6734 Alcoholic liver disease ([place]/20/2020   Alcoholism ([place]   Elevated LDL cholesterol level 05/22/2013   Epistaxis    Erectile dysfunction 05/22/2013   Frequent falls 11/09/2018  GERD (gastroesophageal reflux disease)    Hyperammonemia ([place]) 11/15/2018   Memory change 08/17/2017   RA (rheumatoid arthritis) ([place]) 08/17/2017   RBBB 12/15/2018   Rheumatoid arthritis ([place]) 2010   Shuffling gait 12/19/2018   Tobacco abuse 04/26/2012   Weight loss, unintentional 11/09/2018   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 12/15/2018   Past Surgical History:  Procedure Laterality Date    NASAL ENDOSCOPY WITH EPISTAXIS CONTROL N/A 04/12/2018   Procedure: NASAL ENDOSCOPY WITH EPISTAXIS CONTROL;  Surgeon: Melissa Montane, MD;  Location: [facility];  Service: ENT;  Laterality: N/A;   VASECTOMY     VASECTOMY REVERSAL     Family History  Problem Relation Age of Onset   Lung cancer Father    Heart disease Mother    CAD Brother    Aneurysm Brother    Colon cancer Neg Hx    Esophageal cancer Neg Hx    Stomach cancer Neg Hx    Social History   Socioeconomic History   Marital status: Married    Spouse name: Not on file   Number of children: Not on file   Years of education: Not on file   Highest education level: Not on file  Occupational History   Occupation: retired  Tobacco Use   Smoking status: Current Every Day Smoker    Packs/day: 1.50    Years: 58.00    Pack years: 87.00    Types: Cigarettes   Smokeless tobacco: Never Used  Scientific laboratory technician Use: Never used  Substance and Sexual Activity   Alcohol use: Not Currently    Comment: 1/5 whiskey a day prior   Drug use: No   Sexual activity: Not Currently  Other Topics Concern   Not on file  Social History Narrative   Married. 3 children. 8 grandkids. 1 greatgrandkid.       Retired from being self employed Psychologist, counselling         Does not have a living will.   Desires CPR, does not want prolonged life support if futile.   Social Determinants of Health   Financial Resource Strain: Low Risk    Difficulty of Paying Living Expenses: Not hard at all  Food Insecurity: No Food Insecurity   Worried About Charity fundraiser in the Last Year: Never true   [place] in the Last Year: Never true  Transportation Needs: No Transportation Needs   Lack of Transportation (Medical): No   Lack of Transportation (Non-Medical): No  Physical Activity: Inactive   Days of Exercise per Week: 0 days   Minutes of Exercise per Session: 0 min  Stress: No Stress Concern Present   Feeling of Stress  : Not at all  Social Connections: Moderately Isolated   Frequency of Communication with Friends and Family: Twice a week   Frequency of Social Gatherings with Friends and Family: Twice a week   Attends Religious Services: Never   Marine scientist or Organizations: No   Attends Music therapist: Never   Marital Status: Married    Tobacco Counseling Ready to quit: Not Answered Counseling given: Not Answered   Clinical Intake:  Pre-visit preparation completed: Yes  Pain :  (generalized) Pain Score: 3  Pain Type: Chronic pain Pain Location: Generalized     BMI - recorded: 24.46 Nutritional Status: BMI of 19-24  Normal Nutritional Risks: None Diabetes: No  How often do you need to have someone help you when  you read instructions, pamphlets, or other written materials from your doctor or pharmacy?: 1 - Never  Diabetic?No  Interpreter Needed?: No  Information entered by :: Charlott Rakes, LPN   Activities of Daily Living In your present state of health, do you have any difficulty performing the following activities: 04/12/2020 11/10/2019  Hearing? Y Y  Comment mild loss -  Vision? N Y  Difficulty concentrating or making decisions? N Y  Walking or climbing stairs? N Y  Dressing or bathing? N Y  Doing errands, shopping? N Y  Conservation officer, nature and eating ? N -  Using the Toilet? N -  In the past six months, have you accidently leaked urine? N -  Do you have problems with loss of bowel control? N -  Managing your Medications? N -  Managing your Finances? N -  Housekeeping or managing your Housekeeping? N -  Some recent data might be hidden    Patient Care Team: Marin Olp, MD as PCP - General (Family Medicine) Pyrtle, Lajuan Lines, MD as Consulting Physician (Gastroenterology) Wendie Agreste, MD as Consulting Physician (Family Medicine) Darleen Crocker, MD as Consulting Physician (Ophthalmology)  Indicate any recent Medical Services you may  have received from other than [facility] providers in the past year (date may be approximate).     Assessment:   This is a routine wellness examination for [place].  Hearing/Vision screen  Hearing Screening   _0  _1  _2  _3  _4  _5  _6  _7  _8   Right ear:           Left ear:           Comments: Mild loss   Vision Screening Comments: Pt has not had exam  Dietary issues and exercise activities discussed: Current Exercise Habits: The patient does not participate in regular exercise at present  Goals     Patient Stated     None at this time      Depression Screen PHQ 2/9 Scores 04/12/2020 02/16/2020 12/06/2019 11/16/2018 11/09/2018 06/01/2017 01/30/2016  PHQ - 2 Score 0 0 0 0 - 0 0  PHQ- 9 Score - - 6 - - - -  Exception Documentation - - - - Other- indicate reason in comment box - -  Not completed - - - - Pt states no but Daughter states yes. - -    Fall Risk Fall Risk  04/12/2020 02/16/2020 01/26/2020 12/26/2019 12/06/2019  Falls in the past year? 1 0 0 0 0  Number falls in past yr: 1 0 - - 0  Injury with Fall? 1 0 - - 0  Comment head trauma - - - -  Risk for fall due to : Impaired vision;History of fall(s) - - - -  Follow up Falls prevention discussed - - - -    FALL RISK PREVENTION PERTAINING TO THE HOME:  Any stairs in or around the home? Yes  If so, are there any without handrails? No  Home free of loose throw rugs in walkways, pet beds, electrical cords, etc? Yes  Adequate lighting in your home to reduce risk of falls? Yes   ASSISTIVE DEVICES UTILIZED TO PREVENT FALLS:  Life alert? No  Use of a cane, walker or w/c? No  Grab bars in the bathroom? Yes  Shower chair or bench in shower? Yes  Elevated toilet seat or a handicapped toilet? No   TIMED UP AND GO:  Was the test performed? No .     Cognitive Function: 6CIT: Declined MMSE -  Mini Mental State Exam 02/08/2019 08/17/2017  Orientation to time 5 5  Orientation to Place 3 4  Registration 3 3   Attention/ Calculation 5 5  Recall 3 3  Language- name 2 objects 2 2  Language- repeat 1 1  Language- follow 3 step command 2 3  Language- read & follow direction 1 1  Write a sentence 1 1  Copy design 1 1  Total score 27 29        Immunizations Immunization History  Administered Date(s) Administered   Fluad Quad(high Dose 65+) 04/18/2019, 01/12/2020    TDAP status: Due, Education has been provided regarding the importance of this vaccine. Advised may receive this vaccine at local pharmacy or Health Dept. Aware to provide a copy of the vaccination record if obtained from local pharmacy or Health Dept. Verbalized acceptance and understanding.  Flu Vaccine status: Up to date Done 01/12/20  Pneumococcal vaccine status: Declined,  Education has been provided regarding the importance of this vaccine but patient still declined. Advised may receive this vaccine at local pharmacy or Health Dept. Aware to provide a copy of the vaccination record if obtained from local pharmacy or Health Dept. Verbalized acceptance and understanding.   Covid-19 vaccine status: Information provided on how to obtain vaccines.  Pt states he had vaccine unable to give dates will bring card into next visit  Qualifies for Shingles Vaccine? Yes   Zostavax completed Yes   Shingrix Completed?: No.    Education has been provided regarding the importance of this vaccine. Patient has been advised to call insurance company to determine out of pocket expense if they have not yet received this vaccine. Advised may also receive vaccine at local pharmacy or Health Dept. Verbalized acceptance and understanding.  Screening Tests Health Maintenance  Topic Date Due   COVID-19 Vaccine (1) 04/29/2020 (Originally 05/04/1957)   TETANUS/TDAP  04/12/2021 (Originally 05/04/1964)   PNA vac Low Risk Adult (1 of 2 - PCV13) 04/12/2021 (Originally 05/04/2010)   COLONOSCOPY  06/29/2023   INFLUENZA VACCINE  Completed   Hepatitis C  Screening  Completed    Health Maintenance  There are no preventive care reminders to display for this patient.  Colorectal cancer screening: Type of screening: Colonoscopy. Completed 06/28/13. Repeat every 10 years   Additional Screening:  Hepatitis C Screening:  Completed 11/16/18  Vision Screening: Recommended annual ophthalmology exams for early detection of glaucoma and other disorders of the eye. Is the patient up to date with their annual eye exam?  No  Who is the provider or what is the name of the office in which the patient attends annual eye exams? Pt has not followed up with a provider for eye care    Dental Screening: Recommended annual dental exams for proper oral hygiene  Community Resource Referral / Chronic Care Management: CRR required this visit?  No   CCM required this visit?  No      Plan:     I have personally reviewed and noted the following in the patients chart:    Medical and social history  Use of alcohol, tobacco or illicit drugs   Current medications and supplements  Functional ability and status  Nutritional status  Physical activity  Advanced directives  List of other physicians  Hospitalizations, surgeries, and ER visits in previous 12 months  Vitals  Screenings to include cognitive, depression, and falls  Referrals and appointments  In addition, I have reviewed and discussed with patient certain preventive protocols, quality metrics,  and best practice recommendations. A written personalized care plan for preventive services as well as general preventive health recommendations were provided to patient.     William Brace, LPN   [id number]/[id number]   Nurse Notes: Pt wife William Gibson conducted AWV for patient stating he was unable to answer alone.

## 2020-04-12 NOTE — Patient Instructions (Addendum)
William Gibson , Thank you for taking time to come for your Medicare Wellness Visit. I appreciate your ongoing commitment to your health goals. Please review the following plan we discussed and let me know if I can assist you in the future.   Screening recommendations/referrals: Colonoscopy: Done 06/28/13 Recommended yearly ophthalmology/optometry visit for glaucoma screening and checkup Recommended yearly dental visit for hygiene and checkup  Vaccinations: Influenza vaccine: Done 01/12/20 Up to date Pneumococcal vaccine: Declined  Tdap vaccine: Declined and discussed Shingles vaccine: Shingrix discussed. Please contact your pharmacy for coverage information.    Covid-19: Unsure of dates will bring in card at next visit   Advanced directives: Copies in chart   Conditions/risks identified: None at this time  Next appointment: Follow up in one year for your annual wellness visit.   Preventive Care 74 Years and Older, Male Preventive care refers to lifestyle choices and visits with your health care provider that can promote health and wellness. What does preventive care include?  A yearly physical exam. This is also called an annual well check.  Dental exams once or twice a year.  Routine eye exams. Ask your health care provider how often you should have your eyes checked.  Personal lifestyle choices, including:  Daily care of your teeth and gums.  Regular physical activity.  Eating a healthy diet.  Avoiding tobacco and drug use.  Limiting alcohol use.  Practicing safe sex.  Taking low doses of aspirin every day.  Taking vitamin and mineral supplements as recommended by your health care provider. What happens during an annual well check? The services and screenings done by your health care provider during your annual well check will depend on your age, overall health, lifestyle risk factors, and family history of disease. Counseling  Your health care provider may ask you  questions about your:  Alcohol use.  Tobacco use.  Drug use.  Emotional well-being.  Home and relationship well-being.  Sexual activity.  Eating habits.  History of falls.  Memory and ability to understand (cognition).  Work and work Statistician. Screening  You may have the following tests or measurements:  Height, weight, and BMI.  Blood pressure.  Lipid and cholesterol levels. These may be checked every 5 years, or more frequently if you are over 47 years old.  Skin check.  Lung cancer screening. You may have this screening every year starting at age 5 if you have a 30-pack-year history of smoking and currently smoke or have quit within the past 15 years.  Fecal occult blood test (FOBT) of the stool. You may have this test every year starting at age 73.  Flexible sigmoidoscopy or colonoscopy. You may have a sigmoidoscopy every 5 years or a colonoscopy every 10 years starting at age 58.  Prostate cancer screening. Recommendations will vary depending on your family history and other risks.  Hepatitis C blood test.  Hepatitis B blood test.  Sexually transmitted disease (STD) testing.  Diabetes screening. This is done by checking your blood sugar (glucose) after you have not eaten for a while (fasting). You may have this done every 1-3 years.  Abdominal aortic aneurysm (AAA) screening. You may need this if you are a current or former smoker.  Osteoporosis. You may be screened starting at age 25 if you are at high risk. Talk with your health care provider about your test results, treatment options, and if necessary, the need for more tests. Vaccines  Your health care provider may recommend certain vaccines, such  as:  Influenza vaccine. This is recommended every year.  Tetanus, diphtheria, and acellular pertussis (Tdap, Td) vaccine. You may need a Td booster every 10 years.  Zoster vaccine. You may need this after age 44.  Pneumococcal 13-valent conjugate  (PCV13) vaccine. One dose is recommended after age 66.  Pneumococcal polysaccharide (PPSV23) vaccine. One dose is recommended after age 25. Talk to your health care provider about which screenings and vaccines you need and how often you need them. This information is not intended to replace advice given to you by your health care provider. Make sure you discuss any questions you have with your health care provider. Document Released: 05/10/2015 Document Revised: 01/01/2016 Document Reviewed: 02/12/2015 Elsevier Interactive Patient Education  2017 [place] Prevention in the Home Falls can cause injuries. They can happen to people of all ages. There are many things you can do to make your home safe and to help prevent falls. What can I do on the outside of my home?  Regularly fix the edges of walkways and driveways and fix any cracks.  Remove anything that might make you trip as you walk through a door, such as a raised step or threshold.  Trim any bushes or trees on the path to your home.  Use bright outdoor lighting.  Clear any walking paths of anything that might make someone trip, such as rocks or tools.  Regularly check to see if handrails are loose or broken. Make sure that both sides of any steps have handrails.  Any raised decks and porches should have guardrails on the edges.  Have any leaves, snow, or ice cleared regularly.  Use sand or salt on walking paths during winter.  Clean up any spills in your garage right away. This includes oil or grease spills. What can I do in the bathroom?  Use night lights.  Install grab bars by the toilet and in the tub and shower. Do not use towel bars as grab bars.  Use non-skid mats or decals in the tub or shower.  If you need to sit down in the shower, use a plastic, non-slip stool.  Keep the floor dry. Clean up any water that spills on the floor as soon as it happens.  Remove soap buildup in the tub or shower  regularly.  Attach bath mats securely with double-sided non-slip rug tape.  Do not have throw rugs and other things on the floor that can make you trip. What can I do in the bedroom?  Use night lights.  Make sure that you have a light by your bed that is easy to reach.  Do not use any sheets or blankets that are too big for your bed. They should not hang down onto the floor.  Have a firm chair that has side arms. You can use this for support while you get dressed.  Do not have throw rugs and other things on the floor that can make you trip. What can I do in the kitchen?  Clean up any spills right away.  Avoid walking on wet floors.  Keep items that you use a lot in easy-to-reach places.  If you need to reach something above you, use a strong step stool that has a grab bar.  Keep electrical cords out of the way.  Do not use floor polish or wax that makes floors slippery. If you must use wax, use non-skid floor wax.  Do not have throw rugs and other things on the  floor that can make you trip. What can I do with my stairs?  Do not leave any items on the stairs.  Make sure that there are handrails on both sides of the stairs and use them. Fix handrails that are broken or loose. Make sure that handrails are as long as the stairways.  Check any carpeting to make sure that it is firmly attached to the stairs. Fix any carpet that is loose or worn.  Avoid having throw rugs at the top or bottom of the stairs. If you do have throw rugs, attach them to the floor with carpet tape.  Make sure that you have a light switch at the top of the stairs and the bottom of the stairs. If you do not have them, ask someone to add them for you. What else can I do to help prevent falls?  Wear shoes that:  Do not have high heels.  Have rubber bottoms.  Are comfortable and fit you well.  Are closed at the toe. Do not wear sandals.  If you use a stepladder:  Make sure that it is fully  opened. Do not climb a closed stepladder.  Make sure that both sides of the stepladder are locked into place.  Ask someone to hold it for you, if possible.  Clearly mark and make sure that you can see:  Any grab bars or handrails.  First and last steps.  Where the edge of each step is.  Use tools that help you move around (mobility aids) if they are needed. These include:  Canes.  Walkers.  Scooters.  Crutches.  Turn on the lights when you go into a dark area. Replace any light bulbs as soon as they burn out.  Set up your furniture so you have a clear path. Avoid moving your furniture around.  If any of your floors are uneven, fix them.  If there are any pets around you, be aware of where they are.  Review your medicines with your doctor. Some medicines can make you feel dizzy. This can increase your chance of falling. Ask your doctor what other things that you can do to help prevent falls. This information is not intended to replace advice given to you by your health care provider. Make sure you discuss any questions you have with your health care provider. Document Released: 02/07/2009 Document Revised: 09/19/2015 Document Reviewed: 05/18/2014 Elsevier Interactive Patient Education  2017 [employer].

## 2020-04-13 LAB — COMPLETE METABOLIC PANEL WITH GFR
AG Ratio: 0.8 (calc) — ABNORMAL LOW (ref 1.0–2.5)
ALT: 11 U/L (ref 9–46)
AST: 16 U/L (ref 10–35)
Albumin: 3.7 g/dL (ref 3.6–5.1)
Alkaline phosphatase (APISO): 82 U/L (ref 35–144)
BUN: 15 mg/dL (ref 7–25)
CO2: 25 mmol/L (ref 20–32)
Calcium: 9.9 mg/dL (ref 8.6–10.3)
Chloride: 101 mmol/L (ref 98–110)
Creat: 0.86 mg/dL (ref 0.70–1.18)
GFR, Est African American: 99 mL/min/{1.73_m2} (ref >=60)
GFR, Est Non African American: 85 mL/min/{1.73_m2} (ref >=60)
Globulin: 4.4 g/dL (calc) — ABNORMAL HIGH (ref 1.9–3.7)
Glucose, Bld: 94 mg/dL (ref 65–99)
Potassium: 4.2 mmol/L (ref 3.5–5.3)
Sodium: 136 mmol/L (ref 135–146)
Total Bilirubin: 0.2 mg/dL (ref 0.2–1.2)
Total Protein: 8.1 g/dL (ref 6.1–8.1)

## 2020-04-13 LAB — CBC WITH DIFFERENTIAL/PLATELET
Absolute Monocytes: 697 cells/uL (ref 200–950)
Basophils Absolute: 103 cells/uL (ref 0–200)
Basophils Relative: 1.2 %
Eosinophils Absolute: 267 cells/uL (ref 15–500)
Eosinophils Relative: 3.1 %
HCT: 43.6 % (ref 38.5–50.0)
Hemoglobin: 15 g/dL (ref 13.2–17.1)
Lymphs Abs: 3096 cells/uL (ref 850–3900)
MCH: 32 pg (ref 27.0–33.0)
MCHC: 34.4 g/dL (ref 32.0–36.0)
MCV: 93 fL (ref 80.0–100.0)
MPV: 9.4 fL (ref 7.5–12.5)
Monocytes Relative: 8.1 %
Neutro Abs: 4438 cells/uL (ref 1500–7800)
Neutrophils Relative %: 51.6 %
Platelets: 323 10*3/uL (ref 140–400)
RBC: 4.69 10*6/uL (ref 4.20–5.80)
RDW: 12.1 % (ref 11.0–15.0)
Total Lymphocyte: 36 %
WBC: 8.6 10*3/uL (ref 3.8–10.8)

## 2020-04-24 ENCOUNTER — Other Ambulatory Visit: Payer: Self-pay | Admitting: *Deleted

## 2020-04-24 DIAGNOSIS — F17210 Nicotine dependence, cigarettes, uncomplicated: Secondary | ICD-10-CM

## 2020-04-24 DIAGNOSIS — Z87891 Personal history of nicotine dependence: Secondary | ICD-10-CM

## 2020-05-15 ENCOUNTER — Ambulatory Visit
Admission: RE | Admit: 2020-05-15 | Discharge: 2020-05-15 | Disposition: A | Discharge status: Home / Self Care | Payer: 59 | Source: Ambulatory Visit | Attending: Acute Care | Admitting: Acute Care

## 2020-05-15 DIAGNOSIS — Z87891 Personal history of nicotine dependence: Secondary | ICD-10-CM

## 2020-05-15 DIAGNOSIS — F17210 Nicotine dependence, cigarettes, uncomplicated: Secondary | ICD-10-CM

## 2020-05-16 DIAGNOSIS — M2550 Pain in unspecified joint: Secondary | ICD-10-CM | POA: Diagnosis not present

## 2020-05-16 DIAGNOSIS — R21 Rash and other nonspecific skin eruption: Secondary | ICD-10-CM | POA: Diagnosis not present

## 2020-05-16 DIAGNOSIS — Z6825 Body mass index (BMI) 25.0-25.9, adult: Secondary | ICD-10-CM | POA: Diagnosis not present

## 2020-05-16 DIAGNOSIS — M0579 Rheumatoid arthritis with rheumatoid factor of multiple sites without organ or systems involvement: Secondary | ICD-10-CM | POA: Diagnosis not present

## 2020-05-16 DIAGNOSIS — M150 Primary generalized (osteo)arthritis: Secondary | ICD-10-CM | POA: Diagnosis not present

## 2020-05-16 DIAGNOSIS — E663 Overweight: Secondary | ICD-10-CM | POA: Diagnosis not present

## 2020-05-28 NOTE — Progress Notes (Signed)
Please call patient and let them  know their  low dose Ct was read as a Lung RADS 2: nodules that are benign in appearance and behavior with a very low likelihood of becoming a clinically active cancer due to size or lack of growth. Recommendation per radiology is for a repeat LDCT in 12 months. .Please let them  know we will order and schedule their  annual screening scan for 04/2021. Please let them  know there was notation of CAD on their  scan.  Please remind the patient  that this is a non-gated exam therefore degree or severity of disease  cannot be determined. Please have them  follow up with their PCP regarding potential risk factor modification, dietary therapy or pharmacologic therapy if clinically indicated. Pt.  is not currently on statin therapy. Please place order for annual  screening scan for  04/2021 and fax results to PCP. Thanks so much.  Denise, notation of 2 vessel CAD. I do not see statin on med list in Epic. Have [name] follow up with Dr. Yong Channel. Thanks so much

## 2020-06-03 ENCOUNTER — Encounter: Payer: Self-pay | Admitting: Family Medicine

## 2020-06-03 ENCOUNTER — Other Ambulatory Visit: Payer: Self-pay | Admitting: *Deleted

## 2020-06-03 DIAGNOSIS — F17210 Nicotine dependence, cigarettes, uncomplicated: Secondary | ICD-10-CM

## 2020-06-03 DIAGNOSIS — Z87891 Personal history of nicotine dependence: Secondary | ICD-10-CM

## 2020-06-03 DIAGNOSIS — I700 Atherosclerosis of aorta: Secondary | ICD-10-CM | POA: Insufficient documentation

## 2020-06-03 DIAGNOSIS — J439 Emphysema, unspecified: Secondary | ICD-10-CM | POA: Insufficient documentation

## 2020-07-01 ENCOUNTER — Other Ambulatory Visit: Payer: Self-pay | Admitting: Registered Nurse

## 2020-07-02 ENCOUNTER — Other Ambulatory Visit: Payer: Self-pay | Admitting: Physical Medicine and Rehabilitation

## 2020-07-16 DIAGNOSIS — M2550 Pain in unspecified joint: Secondary | ICD-10-CM | POA: Diagnosis not present

## 2020-07-16 DIAGNOSIS — Z6826 Body mass index (BMI) 26.0-26.9, adult: Secondary | ICD-10-CM | POA: Diagnosis not present

## 2020-07-16 DIAGNOSIS — M722 Plantar fascial fibromatosis: Secondary | ICD-10-CM | POA: Diagnosis not present

## 2020-07-16 DIAGNOSIS — M150 Primary generalized (osteo)arthritis: Secondary | ICD-10-CM | POA: Diagnosis not present

## 2020-07-16 DIAGNOSIS — E663 Overweight: Secondary | ICD-10-CM | POA: Diagnosis not present

## 2020-07-16 DIAGNOSIS — M25562 Pain in left knee: Secondary | ICD-10-CM | POA: Diagnosis not present

## 2020-07-16 DIAGNOSIS — M0579 Rheumatoid arthritis with rheumatoid factor of multiple sites without organ or systems involvement: Secondary | ICD-10-CM | POA: Diagnosis not present

## 2020-07-16 DIAGNOSIS — Z79899 Other long term (current) drug therapy: Secondary | ICD-10-CM | POA: Diagnosis not present

## 2020-09-17 DIAGNOSIS — M150 Primary generalized (osteo)arthritis: Secondary | ICD-10-CM | POA: Diagnosis not present

## 2020-09-17 DIAGNOSIS — Z6826 Body mass index (BMI) 26.0-26.9, adult: Secondary | ICD-10-CM | POA: Diagnosis not present

## 2020-09-17 DIAGNOSIS — M0579 Rheumatoid arthritis with rheumatoid factor of multiple sites without organ or systems involvement: Secondary | ICD-10-CM | POA: Diagnosis not present

## 2020-09-17 DIAGNOSIS — Z79899 Other long term (current) drug therapy: Secondary | ICD-10-CM | POA: Diagnosis not present

## 2020-09-17 DIAGNOSIS — E663 Overweight: Secondary | ICD-10-CM | POA: Diagnosis not present

## 2020-09-17 DIAGNOSIS — M2550 Pain in unspecified joint: Secondary | ICD-10-CM | POA: Diagnosis not present

## 2020-10-21 ENCOUNTER — Encounter: Payer: Self-pay | Admitting: Family Medicine

## 2020-10-21 ENCOUNTER — Ambulatory Visit (INDEPENDENT_AMBULATORY_CARE_PROVIDER_SITE_OTHER): Payer: 59 | Admitting: Family Medicine

## 2020-10-21 ENCOUNTER — Other Ambulatory Visit: Payer: Self-pay

## 2020-10-21 VITALS — BP 117/80 | HR 90 | Temp 98.4°F | Ht 68.0 in | Wt 171.6 lb

## 2020-10-21 DIAGNOSIS — R972 Elevated prostate specific antigen [PSA]: Secondary | ICD-10-CM

## 2020-10-21 DIAGNOSIS — E782 Mixed hyperlipidemia: Secondary | ICD-10-CM

## 2020-10-21 DIAGNOSIS — F17200 Nicotine dependence, unspecified, uncomplicated: Secondary | ICD-10-CM

## 2020-10-21 DIAGNOSIS — M069 Rheumatoid arthritis, unspecified: Secondary | ICD-10-CM

## 2020-10-21 DIAGNOSIS — Z0000 Encounter for general adult medical examination without abnormal findings: Secondary | ICD-10-CM

## 2020-10-21 DIAGNOSIS — I700 Atherosclerosis of aorta: Secondary | ICD-10-CM

## 2020-10-21 DIAGNOSIS — F325 Major depressive disorder, single episode, in full remission: Secondary | ICD-10-CM

## 2020-10-21 DIAGNOSIS — R2689 Other abnormalities of gait and mobility: Secondary | ICD-10-CM

## 2020-10-21 DIAGNOSIS — E7220 Disorder of urea cycle metabolism, unspecified: Secondary | ICD-10-CM

## 2020-10-21 DIAGNOSIS — J449 Chronic obstructive pulmonary disease, unspecified: Secondary | ICD-10-CM

## 2020-10-21 DIAGNOSIS — S069X0S Unspecified intracranial injury without loss of consciousness, sequela: Secondary | ICD-10-CM

## 2020-10-21 DIAGNOSIS — F1021 Alcohol dependence, in remission: Secondary | ICD-10-CM

## 2020-10-21 DIAGNOSIS — K709 Alcoholic liver disease, unspecified: Secondary | ICD-10-CM | POA: Diagnosis not present

## 2020-10-21 LAB — POC URINALSYSI DIPSTICK (AUTOMATED)
Bilirubin, UA: NEGATIVE
Blood, UA: NEGATIVE
Glucose, UA: NEGATIVE
Leukocytes, UA: NEGATIVE
Nitrite, UA: NEGATIVE
Protein, UA: POSITIVE — AB
Spec Grav, UA: >=1.03 — AB (ref 1.010–1.025)
Urobilinogen, UA: 0.2 E.U./dL
pH, UA: 5.5 (ref 5.0–8.0)

## 2020-10-21 MED ORDER — ROSUVASTATIN CALCIUM 5 MG PO TABS: 5.0000 mg | ORAL_TABLET | Freq: Every day | ORAL | 3 refills | Status: DC

## 2020-10-21 MED ORDER — OMEPRAZOLE 10 MG PO CPDR: 10.0000 mg | DELAYED_RELEASE_CAPSULE | Freq: Every day | ORAL | 3 refills | Status: DC

## 2020-10-21 NOTE — Progress Notes (Signed)
Phone: [phone]   Subjective:  Patient presents today for their annual physical. Chief complaint-noted.   See problem oriented charting- ROS- full  review of systems was completed and negative  except for: orthostatic hypotension, walks down stairs sideways to be extra careful, cough  The following were reviewed and entered/updated in epic: Past Medical History:  Diagnosis Date   Abnormal liver function tests 1/58/3094   Alcoholic liver disease ([place]) 12/15/2018   Alcoholism ([place])    Elevated LDL cholesterol level 05/22/2013   Epistaxis    Erectile dysfunction 05/22/2013   Frequent falls 11/09/2018   GERD (gastroesophageal reflux disease)    Hyperammonemia ([place]) 11/15/2018   Memory change 08/17/2017   RA (rheumatoid arthritis) ([place]) 08/17/2017   RBBB 12/15/2018   Rheumatoid arthritis ([place]) 2010   Shuffling gait 12/19/2018   Tobacco abuse 04/26/2012   Weight loss, unintentional 11/09/2018   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 12/15/2018   Patient Active Problem List   Diagnosis Date Noted   History of subarachnoid hemorrhage 11/04/2019    Priority: High   Head injury 11/04/2019    Priority: High   Alcoholic liver disease ([place]) 12/15/2018    Priority: High   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 12/15/2018    Priority: High   RA (rheumatoid arthritis) ([place]) 08/17/2017    Priority: High   Alcohol dependence in early full remission ([place]) 12/15/2016    Priority: High   Tobacco abuse 04/26/2012    Priority: High   Aortic atherosclerosis ([place]) 06/03/2020    Priority: Medium   Emphysema lung ([place]) 06/03/2020    Priority: Medium   Major depression in full remission ([place]) 01/12/2020    Priority: Medium   Insomnia 01/12/2020    Priority: Medium   Vascular headache     Priority: Medium   Hyperlipidemia 02/06/2019    Priority: Medium   Shuffling gait 12/19/2018    Priority: Medium   Frequent falls 11/09/2018    Priority: Medium   GERD (gastroesophageal  reflux disease) 05/23/2014    Priority: Medium   History of pancreatitis 01/12/2020    Priority: Low   Hyponatremia     Priority: Low   RBBB 12/15/2018    Priority: Low   Hyperammonemia ([place]) 11/15/2018    Priority: Low   Memory change 08/17/2017    Priority: Low   Osteoarthritis 01/30/2016    Priority: Low   Erectile dysfunction 05/22/2013    Priority: Low   Past Surgical History:  Procedure Laterality Date   NASAL ENDOSCOPY WITH EPISTAXIS CONTROL N/A 04/12/2018   Procedure: NASAL ENDOSCOPY WITH EPISTAXIS CONTROL;  Surgeon: Melissa Montane, MD;  Location: [facility];  Service: ENT;  Laterality: N/A;   VASECTOMY     VASECTOMY REVERSAL      Family History  Problem Relation Age of Onset   Lung cancer Father    Heart disease Mother    CAD Brother    Aneurysm Brother    Colon cancer Neg Hx    Esophageal cancer Neg Hx    Stomach cancer Neg Hx     Medications- reviewed and updated Current Outpatient Medications  Medication Sig Dispense Refill   CIMZIA PREFILLED 2 X 200 MG/ML KIT Inject into the skin.     meloxicam (MOBIC) 15 MG tablet Take 15 mg by mouth daily.     omeprazole (PRILOSEC) 10 MG capsule Take 1 capsule (10 mg total) by mouth daily. 90 capsule 3   rosuvastatin (CRESTOR) 5 MG  tablet Take 1 tablet (5 mg total) by mouth daily. 90 tablet 3   Cyanocobalamin (VITAMIN B-12 PO) Take 1 tablet by mouth daily. (Patient not taking: Reported on 10/11/735)     folic acid (FOLVITE) 1 MG tablet Take 1 tablet (1 mg total) by mouth daily. (Patient not taking: Reported on 10/21/2020) 30 tablet 0   thiamine (VITAMIN B-1) 100 MG tablet Take 1 tablet (100 mg total) by mouth daily. (Patient not taking: Reported on 10/21/2020) 30 tablet 0   No current facility-administered medications for this visit.    Allergies-reviewed and updated No Known Allergies  Social History   Social History Narrative   Married. 3 children. 8 grandkids. 1 greatgrandkid.       Retired from being self employed  Psychologist, counselling         Does not have a living will.   Desires CPR, does not want prolonged life support if futile.   Objective  Objective:  BP 117/80   Pulse 90   Temp 98.4 F (36.9 C) (Temporal)   Ht $R'5\' 8"'VK$  (1.727 m)   Wt 171 lb 9.6 oz (77.8 kg)   SpO2 96%   BMI 26.09 kg/m  Gen: NAD, resting comfortably, smells of smoke- intermittent cough (baseline for him) HEENT: Mucous membranes are moist. Oropharynx normal Neck: no thyromegaly CV: RRR no murmurs rubs or gallops Lungs: CTAB no crackles, wheeze, rhonchi Abdomen: soft/nontender/nondistended/normal bowel sounds. No rebound or guarding.  Ext: no edema Skin: warm, dry Neuro: grossly normal, moves all extremities, PERRLA   Assessment and Plan  75 y.o. male presenting for annual physical.  Health Maintenance counseling: 1. Anticipatory guidance: Patient counseled regarding regular dental exams -q6 months, eye exams -yearly,  avoiding smoking and second hand smoke- still smoking- see below , limiting alcohol to 2 beverages per day - he is limiting to this but with his history owuld prefer 0 .   2. Risk factor reduction:  Advised patient of need for regular exercise and diet rich and fruits and vegetables to reduce risk of heart attack and stroke. Exercise- working out in the yard still on regular bases. Diet-trying to eat healthy diet- weight tending up- encouraged cutting alcohol.  Wt Readings from Last 3 Encounters:  10/21/20 171 lb 9.6 oz (77.8 kg)  04/12/20 169 lb (76.7 kg)  02/16/20 160 lb 12.8 oz (72.9 kg)   3. Immunizations/screenings/ancillary studies- will call with covid dates. Shingrix recommended at pharmacy. Too early for prevnar 20 Immunization History  Administered Date(s) Administered   Fluad Quad(high Dose 65+) 04/18/2019, 01/12/2020   Pneumococcal Polysaccharide-23 04/12/2020  4. Prostate cancer screening- repeat psa 06/21/2018 back down to 1.7- will repeat with labs today. If normal discontinue checks Lab  Results  Component Value Date   PSA 6.23 (H) 05/19/2018   PSA 2.14 12/15/2016   PSA 2.35 01/23/2016   5. Colon cancer screening - 07/08/13 with 10 year repeat 6. Skin cancer screening- no dermatologist. advised regular sunscreen use. Denies worrisome, changing, or new skin lesions.  7. current smoker- 3/4 PPD down from 1 PPD- congratulated him on cutting down. Recommended full cessation. Still in lung cancer screening program- last checked in January. Will get UA with labs 8. STD screening - only active with wife  Status of chronic or acute concerns   #Alcoholism in remission-patient had not had a drink since November 03, 2019-major fall with head trauma/skull fracture/subarachnoid hemorrhage.  His health is much improved since stopping drinking. He did restart drinking but max  he has had 1-2 in the day in the last few months. Encourage cessation again- considering prior major head trauma/skull fracture/SAH- it is in his best interest to not drink at all.  -also stopped vitamin b1/thiamine- advised to restart  #Hyperlipidemia/aortic atherosclerosis S: CAD on lung cancer screening x 2 vessels.  Lab Results  Component Value Date   ALT 11 04/12/2020   AST 16 04/12/2020   ALKPHOS 76 11/13/2019   BILITOT 0.2 04/12/2020  A/P: Patient's liver function looked okay last visit.  Patient also with CAD on CT scan as well as aortic atherosclerosis-we need to be aggressive about cholesterol and try to get LDL under 70-he agrees to try rosuvastatin 5 mg daily-we will recheck his labs in 6 weeks to see how his cholesterol is doing with LDL goal under 70  # GERD S:Medication: omeprazole 10 mg he thinks OTC. In past on protonix.  B12 levels related to PPI use: Lab Results  Component Value Date   VITAMINB12 1,185 (H) 11/07/2019  A/P: good control BUT this has been expensive for him- he requests to try rx for omperazole 10 mg- this was sent in for him.   - he stopped vitamin b12 I encourage dhim to  restart  #RA- on cimzia through rheumatology and reports working well. Also on meloxicam - will need to monitor kindey function  # Depression/insomnia S: Medication: patient off all meds prior on even trazodone for sleep  Depression screen [facility] 2/9 10/21/2020 04/12/2020 04/12/2020  Decreased Interest 0 0 0  Down, Depressed, Hopeless 0 0 0  PHQ - 2 Score 0 0 0  Altered sleeping 0 0 -  Tired, decreased energy 0 0 -  Change in appetite 0 0 -  Feeling bad or failure about yourself  0 0 -  Trouble concentrating 0 0 -  Moving slowly or fidgety/restless 0 0 -  Suicidal thoughts 0 0 -  PHQ-9 Score 0 0 -   A/P: Patient reports full remission of depression and insomnia being well controlled without medication-continue to monitor-removed trazodone from medication list  Alcoholic liver disease ([place]), Chronic- monitor LFTs with labs- hopefully stable- wil return in 6  weeks  Chronic obstructive pulmonary disease, unspecified COPD type ([place]), Chronic- denies recent issues- declines albuterol   Hyperammonemia ([place]), Chronic- noted in the past but later improved- not routinely followed- likely related to alcohol consumption- encouraged cessatoin  Traumatic brain injury, without loss of consciousness, sequela ([place]), Chronic- continued balance issues- continue to monitor  Recommended follow up: Return in about 6 months (around 04/22/2021) for follow up- or sooner if needed.  Lab/Order associations: NOT fasting   ICD-10-CM   1. Preventative health care  Z00.00     2. Rheumatoid arthritis, involving unspecified site, unspecified whether rheumatoid factor present ([place]) Chronic M06.9     3. Alcoholic liver disease ([facility]) Chronic K70.9     4. Chronic obstructive pulmonary disease, unspecified COPD type ([facility]) Chronic J44.9     5. Alcohol dependence in early full remission ([facility]) Chronic F10.21     6. Major depressive disorder with single episode, in full remission ([place]) Chronic F32.5     7. Aortic  atherosclerosis ([facility]) Chronic I70.0     8. Hyperammonemia ([facility]) Chronic E72.20     9. Traumatic brain injury, without loss of consciousness, sequela ([place]) Chronic S06.9X0S     10. Mixed hyperlipidemia  E78.2 POCT Urinalysis Dipstick (Automated)    CBC with Differential/Platelet    Comprehensive metabolic panel  11. Current smoker  F17.200 Lipid panel      Meds ordered this encounter  Medications   rosuvastatin (CRESTOR) 5 MG tablet    Sig: Take 1 tablet (5 mg total) by mouth daily.    Dispense:  90 tablet    Refill:  3   omeprazole (PRILOSEC) 10 MG capsule    Sig: Take 1 capsule (10 mg total) by mouth daily.    Dispense:  90 capsule    Refill:  3    Return precautions advised.  Garret Reddish, MD

## 2020-10-21 NOTE — Patient Instructions (Addendum)
Health Maintenance Due  Topic Date Due   COVID-19 Vaccine (1) Will call with dates.  Never done   Zoster Vaccines- Shingrix (1 of 2) Please check with your local pharmacy for this immunization.  Never done   Urine only today  Schedule a lab visit at the check out desk within in 6 weeks. Return for future fasting labs meaning nothing but water after midnight please. Ok to take your medications with water.   Start rosuvastatin 5 mg daily at night  I sent prilosec/omeprazole into pharmacy  Recommended follow up: Return in about 6 months (around 04/22/2021) for follow up- or sooner if needed.

## 2020-10-30 IMAGING — US US LIVER ELASTOGRAPHY
1 series · 13 of 25 positions shown · non-contrast
Comparison: CT abdomen 11/28/2018

CLINICAL DATA: Abnormal LFTs.  Fatty liver.

EXAM:
US LIVER ELASTOGRAPHY
TECHNIQUE: Ultrasound elastography evaluation of the liver was performed. A
region of interest was placed in the right lobe of the liver.
Following application of a compressive sonographic pulse, shear
waves were detected in the adjacent hepatic tissue and the shear
wave velocity was calculated. Multiple assessments were performed at
the selected site. Median shear wave velocity is correlated to a
Metavir fibrosis score.

[Series 1: us liver elastography · 13 of 45 slices shown]
[im 1/45]
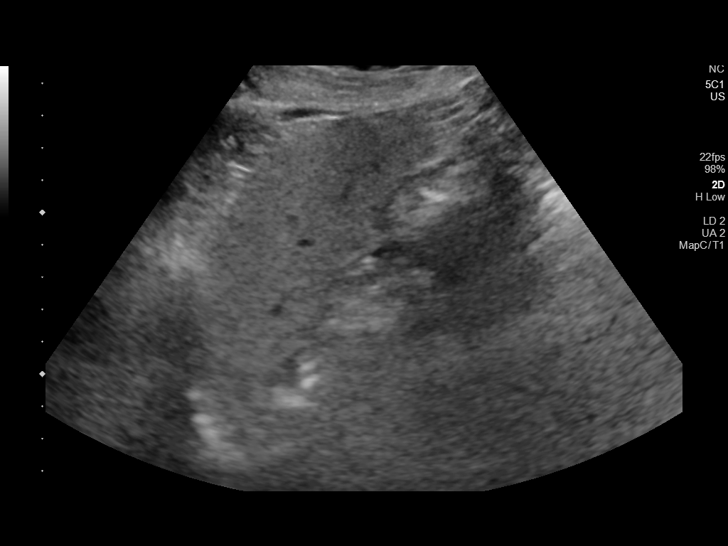
[im 4/45]
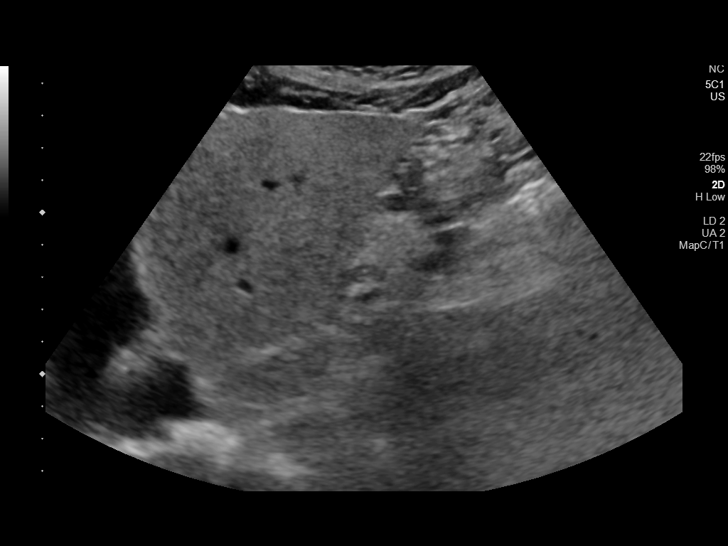
[im 8/45]
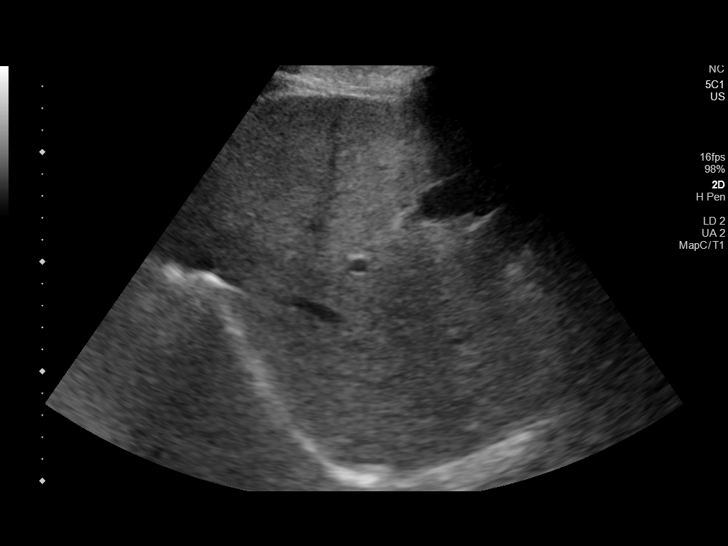
[im 12/45]
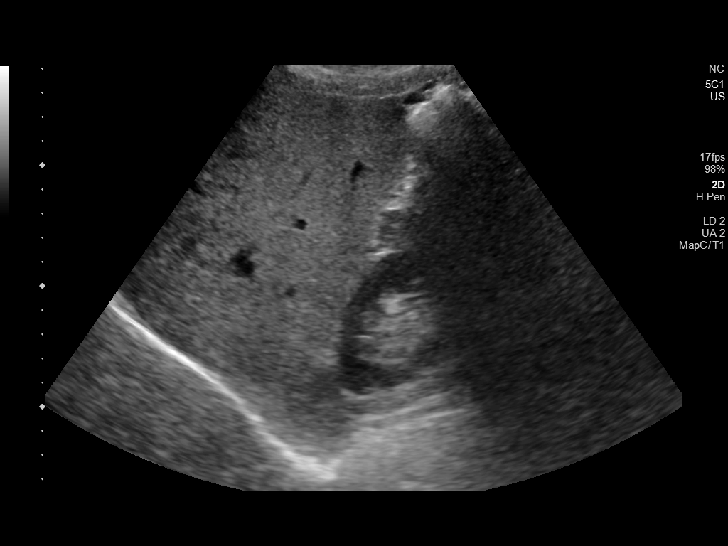
[im 15/45]
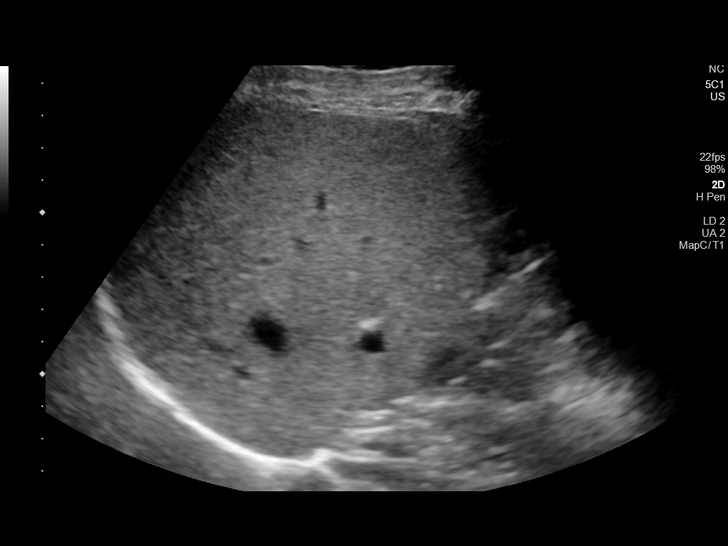
[im 19/45]
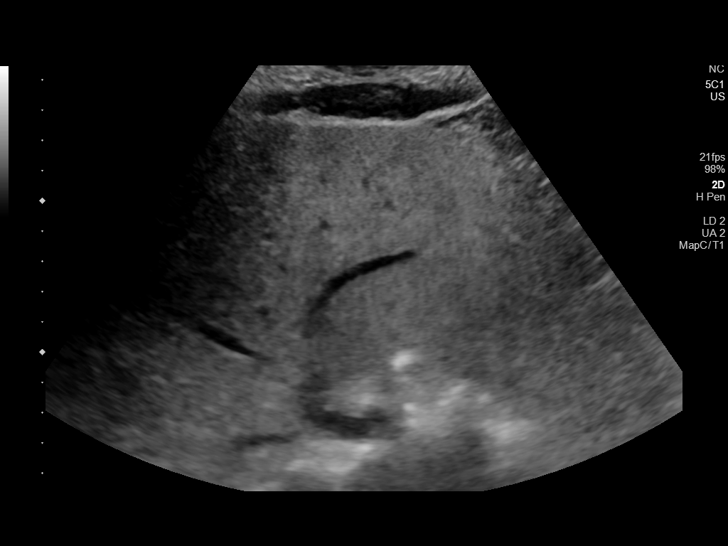
[im 23/45]
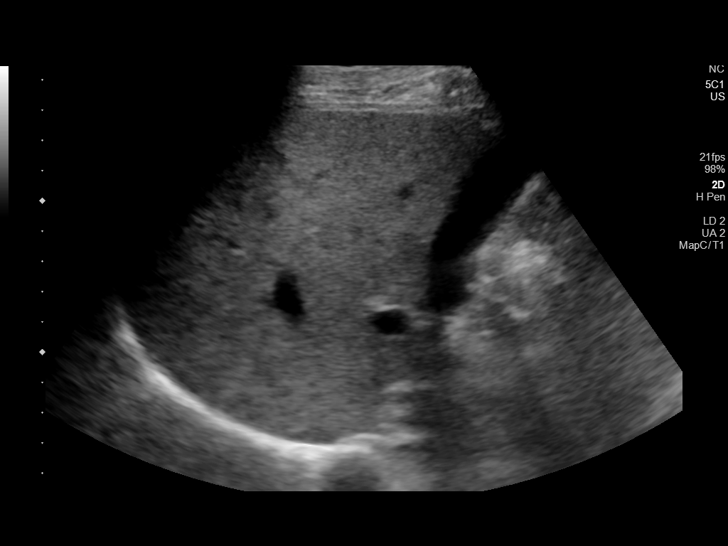
[im 26/45]
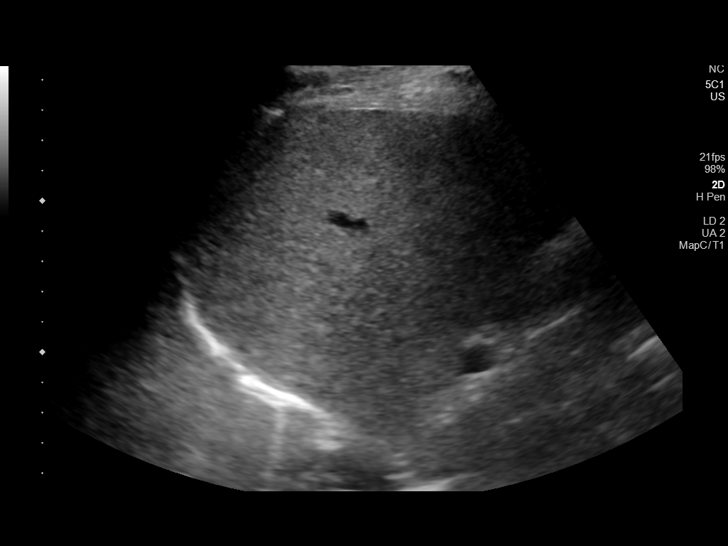
[im 30/45]
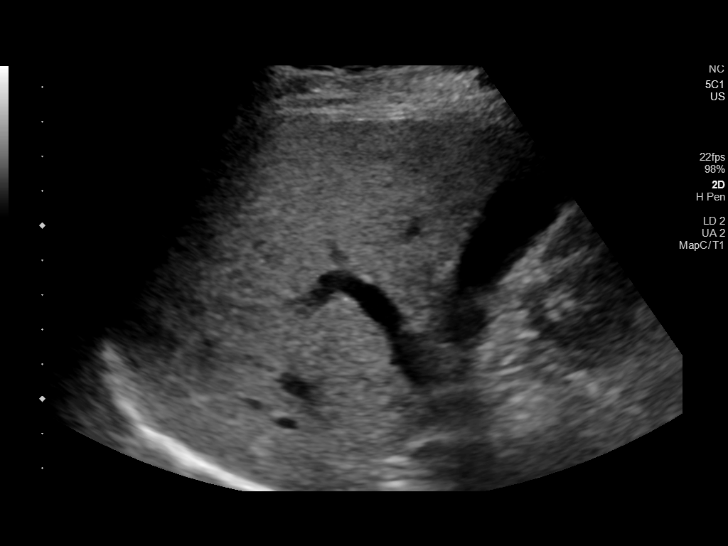
[im 34/45]
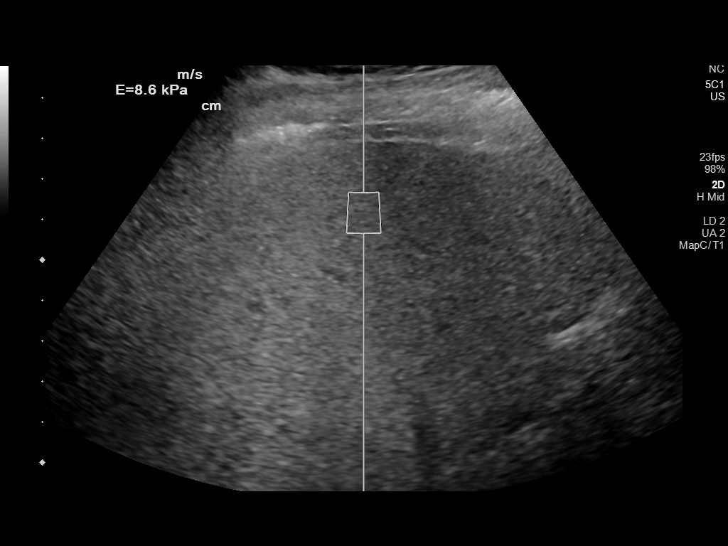
[im 37/45]
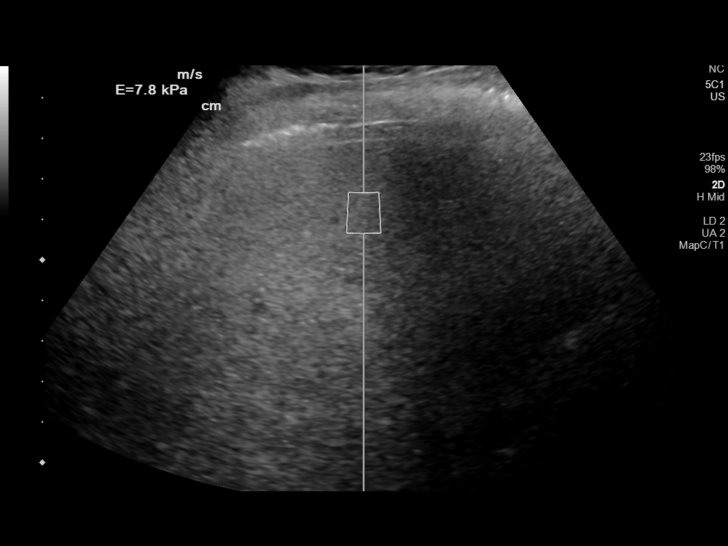
[im 41/45]
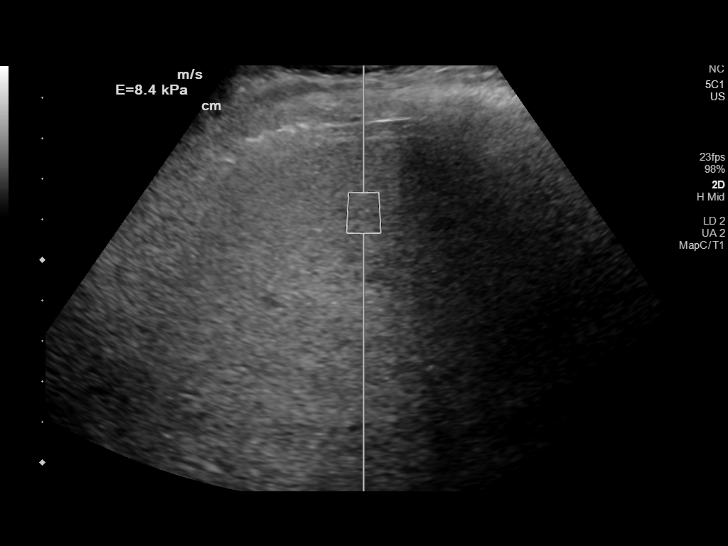
[im 45/45]
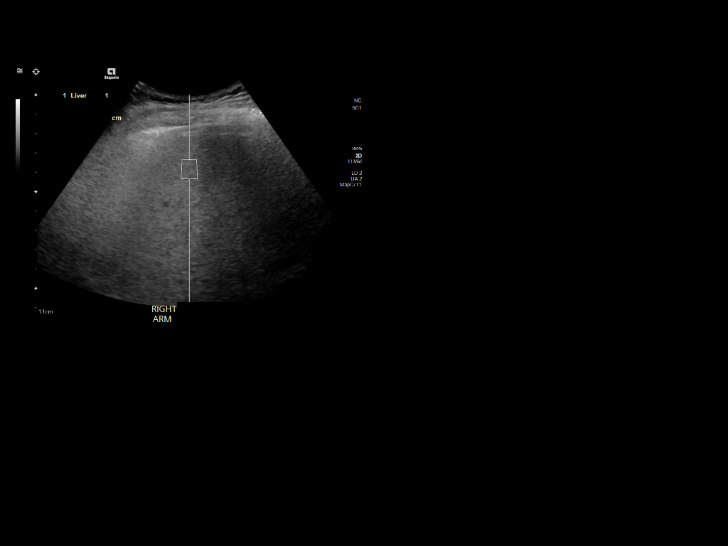

[13 of 25 positions shown; findings below may reference images not displayed]

FINDINGS: Liver: No focal lesion identified. Within normal limits in
parenchymal echogenicity. Portal vein is patent on color Doppler
imaging with normal direction of blood flow towards the liver.

ULTRASOUND HEPATIC ELASTOGRAPHY

Device: Siemens Helix VTQ

Patient position: Supine

Transducer: 5 C1

Number of measurements: 10

Hepatic segment:  8

Median velocity:   1.65 m/sec

IQR:

IQR/Median velocity ratio:

Corresponding Metavir fibrosis score:  F2 + some F3

Risk of fibrosis: Moderate

Limitations of exam: None

Please note that abnormal shear wave velocities may also be
identified in clinical settings other than with hepatic fibrosis,
such as: acute hepatitis, elevated right heart and central venous
pressures including use of beta blockers, Otniel disease
([place]), infiltrative processes such as
mastocytosis/amyloidosis/infiltrative tumor, extrahepatic
cholestasis, in the post-prandial state, and liver transplantation.
Correlation with patient history, laboratory data, and clinical
condition recommended.
IMPRESSION: Liver: Negative

Elastography: Median hepatic shear wave velocity is calculated at
1.65 m/sec.

Corresponding Metavir fibrosis score is F2 + some F3.

Risk of fibrosis is Moderate.

Follow-up: Additional testing appropriate

## 2020-12-04 ENCOUNTER — Other Ambulatory Visit (INDEPENDENT_AMBULATORY_CARE_PROVIDER_SITE_OTHER): Payer: 59

## 2020-12-04 ENCOUNTER — Other Ambulatory Visit: Payer: Self-pay

## 2020-12-04 DIAGNOSIS — E782 Mixed hyperlipidemia: Secondary | ICD-10-CM

## 2020-12-04 DIAGNOSIS — R972 Elevated prostate specific antigen [PSA]: Secondary | ICD-10-CM

## 2020-12-04 DIAGNOSIS — F17200 Nicotine dependence, unspecified, uncomplicated: Secondary | ICD-10-CM

## 2020-12-04 LAB — CBC WITH DIFFERENTIAL/PLATELET
Basophils Absolute: 0.1 10*3/uL (ref 0.0–0.1)
Basophils Relative: 1 % (ref 0.0–3.0)
Eosinophils Absolute: 0.4 10*3/uL (ref 0.0–0.7)
Eosinophils Relative: 5.6 % — ABNORMAL HIGH (ref 0.0–5.0)
HCT: 44.8 % (ref 39.0–52.0)
Hemoglobin: 14.9 g/dL (ref 13.0–17.0)
Lymphocytes Relative: 33.4 % (ref 12.0–46.0)
Lymphs Abs: 2.4 10*3/uL (ref 0.7–4.0)
MCHC: 33.3 g/dL (ref 30.0–36.0)
MCV: 98 fl (ref 78.0–100.0)
Monocytes Absolute: 0.9 10*3/uL (ref 0.1–1.0)
Monocytes Relative: 12.5 % — ABNORMAL HIGH (ref 3.0–12.0)
Neutro Abs: 3.3 10*3/uL (ref 1.4–7.7)
Neutrophils Relative %: 47.5 % (ref 43.0–77.0)
Platelets: 214 10*3/uL (ref 150.0–400.0)
RBC: 4.57 Mil/uL (ref 4.22–5.81)
RDW: 14.2 % (ref 11.5–15.5)
WBC: 7 10*3/uL (ref 4.0–10.5)

## 2020-12-04 LAB — LIPID PANEL
Cholesterol: 162 mg/dL (ref 0–200)
HDL: 78 mg/dL (ref >39.00)
LDL Cholesterol: 67 mg/dL (ref 0–99)
NonHDL: 84.09
Total CHOL/HDL Ratio: 2
Triglycerides: 83 mg/dL (ref 0.0–149.0)
VLDL: 16.6 mg/dL (ref 0.0–40.0)

## 2020-12-04 LAB — COMPREHENSIVE METABOLIC PANEL
ALT: 22 U/L (ref 0–53)
AST: 31 U/L (ref 0–37)
Albumin: 4 g/dL (ref 3.5–5.2)
Alkaline Phosphatase: 80 U/L (ref 39–117)
BUN: 10 mg/dL (ref 6–23)
CO2: 23 mEq/L (ref 19–32)
Calcium: 9.6 mg/dL (ref 8.4–10.5)
Chloride: 99 mEq/L (ref 96–112)
Creatinine, Ser: 0.92 mg/dL (ref 0.40–1.50)
GFR: 81.33 mL/min (ref >60.00)
Glucose, Bld: 92 mg/dL (ref 70–99)
Potassium: 4.2 mEq/L (ref 3.5–5.1)
Sodium: 134 mEq/L — ABNORMAL LOW (ref 135–145)
Total Bilirubin: 0.6 mg/dL (ref 0.2–1.2)
Total Protein: 8 g/dL (ref 6.0–8.3)

## 2020-12-04 LAB — PSA: PSA: 1.02 ng/mL (ref 0.10–4.00)

## 2021-01-04 ENCOUNTER — Other Ambulatory Visit: Payer: Self-pay | Admitting: Family Medicine

## 2021-01-06 ENCOUNTER — Other Ambulatory Visit: Payer: Self-pay

## 2021-01-06 MED ORDER — ROSUVASTATIN CALCIUM 5 MG PO TABS: 5.0000 mg | ORAL_TABLET | Freq: Every day | ORAL | 3 refills | Status: DC

## 2021-03-18 DIAGNOSIS — Z79899 Other long term (current) drug therapy: Secondary | ICD-10-CM | POA: Diagnosis not present

## 2021-03-18 DIAGNOSIS — E663 Overweight: Secondary | ICD-10-CM | POA: Diagnosis not present

## 2021-03-18 DIAGNOSIS — M0579 Rheumatoid arthritis with rheumatoid factor of multiple sites without organ or systems involvement: Secondary | ICD-10-CM | POA: Diagnosis not present

## 2021-03-18 DIAGNOSIS — Z6825 Body mass index (BMI) 25.0-25.9, adult: Secondary | ICD-10-CM | POA: Diagnosis not present

## 2021-03-18 DIAGNOSIS — R5383 Other fatigue: Secondary | ICD-10-CM | POA: Diagnosis not present

## 2021-03-18 DIAGNOSIS — M150 Primary generalized (osteo)arthritis: Secondary | ICD-10-CM | POA: Diagnosis not present

## 2021-03-18 DIAGNOSIS — M2550 Pain in unspecified joint: Secondary | ICD-10-CM | POA: Diagnosis not present

## 2021-04-02 ENCOUNTER — Telehealth: Payer: Self-pay | Admitting: Family Medicine

## 2021-04-02 NOTE — Telephone Encounter (Signed)
Copied from [place] [id number]. Topic: Medicare AWV >> Apr 02, 2021 11:16 AM Harris-Coley, Hannah Beat wrote: Reason for CRM: Left message for patient to schedule Annual Wellness Visit.  Please schedule with Nurse Health Advisor Charlott Rakes, RN at [facility].  Please call [phone] ask for [facility]

## 2021-04-29 ENCOUNTER — Other Ambulatory Visit: Payer: Self-pay

## 2021-04-29 ENCOUNTER — Ambulatory Visit (HOSPITAL_COMMUNITY)
Admission: RE | Admit: 2021-04-29 | Discharge: 2021-04-29 | Disposition: A | Discharge status: Home / Self Care | Payer: 59 | Source: Ambulatory Visit | Attending: Family Medicine | Admitting: Family Medicine

## 2021-04-29 ENCOUNTER — Encounter: Payer: Self-pay | Admitting: Family Medicine

## 2021-04-29 ENCOUNTER — Ambulatory Visit (INDEPENDENT_AMBULATORY_CARE_PROVIDER_SITE_OTHER): Payer: 59 | Admitting: Family Medicine

## 2021-04-29 VITALS — BP 120/60 | HR 74 | Temp 98.7°F | Ht 68.0 in | Wt 160.8 lb

## 2021-04-29 DIAGNOSIS — R1310 Dysphagia, unspecified: Secondary | ICD-10-CM | POA: Diagnosis not present

## 2021-04-29 DIAGNOSIS — E782 Mixed hyperlipidemia: Secondary | ICD-10-CM

## 2021-04-29 DIAGNOSIS — F325 Major depressive disorder, single episode, in full remission: Secondary | ICD-10-CM | POA: Diagnosis not present

## 2021-04-29 DIAGNOSIS — F1021 Alcohol dependence, in remission: Secondary | ICD-10-CM | POA: Diagnosis not present

## 2021-04-29 DIAGNOSIS — G8191 Hemiplegia, unspecified affecting right dominant side: Secondary | ICD-10-CM | POA: Insufficient documentation

## 2021-04-29 DIAGNOSIS — K219 Gastro-esophageal reflux disease without esophagitis: Secondary | ICD-10-CM

## 2021-04-29 DIAGNOSIS — E7220 Disorder of urea cycle metabolism, unspecified: Secondary | ICD-10-CM

## 2021-04-29 DIAGNOSIS — M069 Rheumatoid arthritis, unspecified: Secondary | ICD-10-CM | POA: Diagnosis not present

## 2021-04-29 DIAGNOSIS — G9389 Other specified disorders of brain: Secondary | ICD-10-CM | POA: Diagnosis not present

## 2021-04-29 DIAGNOSIS — I6782 Cerebral ischemia: Secondary | ICD-10-CM | POA: Diagnosis not present

## 2021-04-29 DIAGNOSIS — J449 Chronic obstructive pulmonary disease, unspecified: Secondary | ICD-10-CM

## 2021-04-29 DIAGNOSIS — R739 Hyperglycemia, unspecified: Secondary | ICD-10-CM | POA: Diagnosis not present

## 2021-04-29 DIAGNOSIS — G4700 Insomnia, unspecified: Secondary | ICD-10-CM

## 2021-04-29 DIAGNOSIS — I700 Atherosclerosis of aorta: Secondary | ICD-10-CM

## 2021-04-29 DIAGNOSIS — Z79899 Other long term (current) drug therapy: Secondary | ICD-10-CM | POA: Diagnosis not present

## 2021-04-29 DIAGNOSIS — K709 Alcoholic liver disease, unspecified: Secondary | ICD-10-CM

## 2021-04-29 LAB — COMPREHENSIVE METABOLIC PANEL
ALT: 143 U/L — ABNORMAL HIGH (ref 0–53)
AST: 112 U/L — ABNORMAL HIGH (ref 0–37)
Albumin: 3.7 g/dL (ref 3.5–5.2)
Alkaline Phosphatase: 65 U/L (ref 39–117)
BUN: 28 mg/dL — ABNORMAL HIGH (ref 6–23)
CO2: 23 mEq/L (ref 19–32)
Calcium: 9.9 mg/dL (ref 8.4–10.5)
Chloride: 100 mEq/L (ref 96–112)
Creatinine, Ser: 1.01 mg/dL (ref 0.40–1.50)
GFR: 72.51 mL/min (ref >60.00)
Glucose, Bld: 83 mg/dL (ref 70–99)
Potassium: 4.4 mEq/L (ref 3.5–5.1)
Sodium: 132 mEq/L — ABNORMAL LOW (ref 135–145)
Total Bilirubin: 0.4 mg/dL (ref 0.2–1.2)
Total Protein: 8.3 g/dL (ref 6.0–8.3)

## 2021-04-29 LAB — CBC WITH DIFFERENTIAL/PLATELET
Basophils Absolute: 0.1 10*3/uL (ref 0.0–0.1)
Basophils Relative: 1.3 % (ref 0.0–3.0)
Eosinophils Absolute: 0.3 10*3/uL (ref 0.0–0.7)
Eosinophils Relative: 4 % (ref 0.0–5.0)
HCT: 47.3 % (ref 39.0–52.0)
Hemoglobin: 15.5 g/dL (ref 13.0–17.0)
Lymphocytes Relative: 34 % (ref 12.0–46.0)
Lymphs Abs: 2.7 10*3/uL (ref 0.7–4.0)
MCHC: 32.7 g/dL (ref 30.0–36.0)
MCV: 100.2 fl — ABNORMAL HIGH (ref 78.0–100.0)
Monocytes Absolute: 1.1 10*3/uL — ABNORMAL HIGH (ref 0.1–1.0)
Monocytes Relative: 13.5 % — ABNORMAL HIGH (ref 3.0–12.0)
Neutro Abs: 3.7 10*3/uL (ref 1.4–7.7)
Neutrophils Relative %: 47.2 % (ref 43.0–77.0)
Platelets: 233 10*3/uL (ref 150.0–400.0)
RBC: 4.72 Mil/uL (ref 4.22–5.81)
RDW: 13.3 % (ref 11.5–15.5)
WBC: 7.8 10*3/uL (ref 4.0–10.5)

## 2021-04-29 LAB — HEMOGLOBIN A1C: Hgb A1c MFr Bld: 6.1 % (ref 4.6–6.5)

## 2021-04-29 LAB — LDL CHOLESTEROL, DIRECT: Direct LDL: 152 mg/dL

## 2021-04-29 NOTE — Patient Instructions (Addendum)
Health Maintenance Due  Topic Date Due   COVID-19 Vaccine (1) -Please call back and provide [place] with the dates  of your COVID vaccinations.  Never done   Please stop by lab before you go If you have mychart- we will send your results within 3 business days of [place] receiving them.  If you do not have mychart- we will call you about results within 5 business days of [place] receiving them.  *please also note that you will see labs on mychart as soon as they post. I will later go in and write notes on them- will say "notes from Dr. Yong Channel"  Stat CT- team please give him information before he leaves  We will call you within two weeks about your referral to [facility] for PT/OT. If you do not hear within 2 weeks, give [place] a call.   Team please perform an EKG today under right hemiparesis.  Please continue to use your walker at all times.  If your symptoms worsen, please go to the hospital immediately.  Schedule a visit with Dr. Tarri Glenn of GI for liver health once you start to get better.  Also continue complete cessation from alcohol to help with your liver health. Encourage complete smoking cessation as well.  Make sure you are taking your rosuvastatin 5 mg when you get home and let me know   Recommended follow up: Return in about 3 weeks (around 05/20/2021) for next already scheduled visit. -can use same day slot

## 2021-04-29 NOTE — Progress Notes (Signed)
Phone [phone] In person visit   Subjective:   William Gibson is a 76 y.o. year old very pleasant male patient who presents for/with See problem oriented charting Chief Complaint  Patient presents with   Follow-up    Pt uses walker at all times, has right sided weakness and no strength, it is hard for pt to swallow at times and he mumbles also and shuffles his feet when walking and needs helps getting dressed (socks/shirts)   Depression   Hyperlipidemia   Gastroesophageal Reflux   This visit occurred during the SARS-CoV-2 public health emergency.  Safety protocols were in place, including screening questions prior to the visit, additional usage of staff PPE, and extensive cleaning of exam room while observing appropriate contact time as indicated for disinfecting solutions.   Past Medical History-  Patient Active Problem List   Diagnosis Date Noted   History of subarachnoid hemorrhage 11/04/2019    Priority: High   Head injury 11/04/2019    Priority: High   Alcoholic liver disease ([place]) 12/15/2018    Priority: High   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 12/15/2018    Priority: High   RA (rheumatoid arthritis) ([place]) 08/17/2017    Priority: High   Alcohol dependence in early full remission ([place]) 12/15/2016    Priority: High   Tobacco abuse 04/26/2012    Priority: High   Aortic atherosclerosis ([place]) 06/03/2020    Priority: Medium    Emphysema lung ([place]) 06/03/2020    Priority: Medium    Major depression in full remission ([place]) 01/12/2020    Priority: Medium    Insomnia 01/12/2020    Priority: Medium    Vascular headache     Priority: Medium    Hyperlipidemia 02/06/2019    Priority: Medium    Shuffling gait 12/19/2018    Priority: Medium    Frequent falls 11/09/2018    Priority: Medium    GERD (gastroesophageal reflux disease) 05/23/2014    Priority: Medium    History of pancreatitis 01/12/2020    Priority: Low   Hyponatremia     Priority: Low   RBBB  12/15/2018    Priority: Low   Hyperammonemia ([place]) 11/15/2018    Priority: Low   Memory change 08/17/2017    Priority: Low   Osteoarthritis 01/30/2016    Priority: Low   Erectile dysfunction 05/22/2013    Priority: Low    Medications- reviewed and updated Current Outpatient Medications  Medication Sig Dispense Refill   CIMZIA PREFILLED 2 X 200 MG/ML KIT Inject into the skin.     Cyanocobalamin (VITAMIN B-12 PO) Take 1 tablet by mouth daily.     folic acid (FOLVITE) 1 MG tablet Take 1 tablet (1 mg total) by mouth daily. 30 tablet 0   meloxicam (MOBIC) 15 MG tablet Take 15 mg by mouth daily.     omeprazole (PRILOSEC) 10 MG capsule Take 1 capsule (10 mg total) by mouth daily. 90 capsule 3   rosuvastatin (CRESTOR) 5 MG tablet Take 1 tablet (5 mg total) by mouth daily. 90 tablet 3   thiamine (VITAMIN B-1) 100 MG tablet Take 1 tablet (100 mg total) by mouth daily. 30 tablet 0   No current facility-administered medications for this visit.     Objective:  BP 120/60    Pulse 74    Temp 98.7 F (37.1 C)    Ht 5' 8" (1.727 m)    Wt 160 lb 12.8 oz (72.9 kg)  SpO2 98%    BMI 24.45 kg/m  Gen: NAD, resting comfortably CV: RRR no murmurs rubs or gallops Lungs: CTAB no crackles, wheeze, rhonchi Abdomen: soft/nontender/nondistended/normal bowel sounds. No rebound or guarding.  Ext: no edema Skin: warm, dry Neuro: CN II-XII intact, sensation and reflexes normal throughout, 5/5 muscle strength in bilateral upper and lower extremities. Normal finger to nose- slightly tremulous on the right side. Normal rapid alternating movements. No pronator drift. Normal romberg. Normal gait.   EKG: Sinus rhythm with rate 91, right axis, normal intervals other than prolonged QRS, possible RVH, right bundle branch block noted- (for rvh has emphysema)      Assessment and Plan   # right sided weakness/hemiparesis S:Symptoms started a few days before Christmas. Declined ambulance or going to ED or seeking  care at the time-reported only wanted to follow up with [place]. Did have a bad cough around that time that has improved.    He has noted weakness in the right side of his body- if doesn't use his walker has a tendency to fall. May drop fork- he is right handed and only noting weakness o nthe right side.   Right leg gives out when walking and seems to have a limp per family (daughter with him today). Daughter also noted top lip not moving all the way across .   Feels fatigued overall. Had some incontinence but doing better. Also having harder time swallowing - choked at times. Mumbling more than usual- speech more difficult to understand at times per family (at moment doing better). Seems to shuffle feet and was not doing prior. Needs help getting dressed- socks/shirts. Did have feeling of numbness in right hand at first but not presented A/P: New onset right hemiparesis for approximately 2 weeks-strongly suspicious for stroke and unfortunately patient did not seek care at that time. - Initial work-up will include stat head CT especially with history of subarachnoid hemorrhage though this was related to prior head trauma-thankfully CT came back without acute bleed.  In the absence of trauma I think it is reasonable to start aspirin 81 mg due to strong suspicion of stroke -No obvious atrial fibrillation on EKG although this does not firmly rule this out as cause of stroke (doubt seizure, migraine without aura, doubt mass with reassuring CT, no evidence of recent head trauma, doubt infectious, with persistent symptoms not a TIA) -We will get MRI of the brain as next step and if stroke is found as well as MRA head and neck -stay off meloxicam  #Alcoholism in remission-patient had drastically reduced drinking since November 03, 2019-major fall with head trauma/skull fracture/subarachnoid hemorrhage.  -Took a period of being off alcohol completely but was drinking 1-2 max per day at last visit- last few days essation  completely- before that was 1-2 max per day (had been higher for short term over holidays but usually 1-2 per day)  -Strongly encourage complete cessation once again-in his best interest to remain off alcohol completely -Last visit advised him to restart vitamin B1/thiamine-need to recheck at follow up -We also reviewed Dr. Hulen Shouts notes about prior alcoholic liver disease and potential for transformation to cirrhosis in the long run-he has seen Dr. Tarri Glenn in the past with last elastography noted August 2020-encouraged return follow-up to GI today -Patient with hyperammonemia in the past- labs later improved- with no confusion at present monitor for symptoms only though I am concerned about stroke as above  #hyperlipidemia/aortic atherosclerosis/coronary artery calcium #Tobacco abuse S: Medication:rosuvastatin 5 mg  daily - CAD on lung cancer screening x 2 vessels.  Lab Results  Component Value Date   CHOL 162 12/04/2020   HDL 78.00 12/04/2020   LDLCALC 67 12/04/2020   LDLDIRECT 143.8 05/15/2013   TRIG 83.0 12/04/2020   CHOLHDL 2 12/04/2020   A/P: LDL at goal last check-with goal 70 or less for aortic atherosclerosis and coronary artery calcium- also starting aspirin with likely stroke and CT without new bleed (encephalomalacia noted)    For tobacco abuse encouraged full cessation-continues to smoke 1 pack/day-continue lung cancer screening program and check urinalysis at least once yearly for hematuria - down to 1/2 PPD- encouraged full cessation  # GERD S:Medication: Omeprazole 10 mg -over-the-counter reported in the past on protonix A/P: Reports good control-continue current medication  #Rheumatoid arthritis- on cimzia through rheumatology. . Also on meloxicam - will need to monitor kindey function-update CMP today.  Reasonable control-continue current medication as long as no decrease in renal function  # Depression/Insomnia S: Medication: patient off all meds prior on even  trazodone for sleep . Some worsening scores related to acute concerns Depression screen [facility] 2/9 04/29/2021 10/21/2020 04/12/2020  Decreased Interest 1 0 0  Down, Depressed, Hopeless 0 0 0  PHQ - 2 Score 1 0 0  Altered sleeping 3 0 0  Tired, decreased energy 3 0 0  Change in appetite 2 0 0  Feeling bad or failure about yourself  1 0 0  Trouble concentrating 0 0 0  Moving slowly or fidgety/restless 3 0 0  Suicidal thoughts 0 0 0  PHQ-9 Score 13 0 0  Difficult doing work/chores Extremely dIfficult - -   A/P: difficult to fully assess in light of possible CVA- will reevaluate in 3 weeks or at subsequent visit when acute concerns more strongly addressed  #emphysema noted on prior lung CT- some RVH on EKG- could be caused by emphysema- strongly encouraged to quit smoking (primary treatment at present)  Recommended follow up: Return in about 3 weeks (around 05/20/2021) for next already scheduled visit.   Lab/Order associations:   ICD-10-CM   1. Right hemiparesis ([place])  G81.91 EKG 12-Lead    Ambulatory referral to [facility] (5MM)    LDL cholesterol, direct    2. Alcohol dependence in early full remission ([place])  F10.21     3. Rheumatoid arthritis, involving unspecified site, unspecified whether rheumatoid factor present ([place])  M06.9     4. Major depressive disorder with single episode, in full remission ([place])  F32.5     5. Alcoholic liver disease (HCC) Chronic K70.9     6. Chronic obstructive pulmonary disease, unspecified COPD type (HCC) Chronic J44.9     7. Aortic atherosclerosis (HCC) Chronic I70.0     8. Hyperammonemia (HCC) Chronic E72.20     9. Gastroesophageal reflux disease, unspecified whether esophagitis present  K21.9     10. Insomnia, unspecified type  G47.00     11. High risk medication use  Z79.899 Comprehensive metabolic panel    12. Mixed hyperlipidemia  E78.2 Comprehensive metabolic panel    LDL cholesterol, direct    CBC with  Differential/Platelet    Hemoglobin A1c    13. Dysphagia, unspecified type  R13.10 Ambulatory referral to [facility] (5MM)    14. Hyperglycemia  R73.9 Hemoglobin A1c     Time Spent: 45 minutes of total time (1:30 PM- 2:15 PM) was spent on the  date of the encounter performing the following actions: chart review prior to seeing the patient, obtaining history, performing a medically necessary exam, counseling on the treatment plan, placing orders, and documenting in our EHR.  -separate additional  time was sent on EKG interpretation after 6 PM  I,Harris Phan,acting as a scribe for Garret Reddish, MD.,have documented all relevant documentation on the behalf of Garret Reddish, MD,as directed by  Garret Reddish, MD while in the presence of Garret Reddish, MD.  I, Garret Reddish, MD, have reviewed all documentation for this visit. The documentation on 04/29/21 for the exam, diagnosis, procedures, and orders are all accurate and complete.   Return precautions advised.  Garret Reddish, MD

## 2021-04-30 ENCOUNTER — Encounter: Payer: Self-pay | Admitting: Family Medicine

## 2021-05-01 ENCOUNTER — Other Ambulatory Visit: Payer: Self-pay

## 2021-05-01 DIAGNOSIS — R1310 Dysphagia, unspecified: Secondary | ICD-10-CM

## 2021-05-01 DIAGNOSIS — G8191 Hemiplegia, unspecified affecting right dominant side: Secondary | ICD-10-CM

## 2021-05-01 NOTE — Telephone Encounter (Signed)
Patient has been scheduled for 05/02/21 at [facility]- I have updated the daughter and she is aware of the date/time-FYI

## 2021-05-01 NOTE — Telephone Encounter (Signed)
Pt daughter called regarding STAT MRI. She hadnt heard anything back regarding it.

## 2021-05-02 ENCOUNTER — Other Ambulatory Visit: Payer: Self-pay | Admitting: Family Medicine

## 2021-05-02 ENCOUNTER — Other Ambulatory Visit: Payer: Self-pay

## 2021-05-02 ENCOUNTER — Ambulatory Visit (HOSPITAL_COMMUNITY)
Admission: RE | Admit: 2021-05-02 | Discharge: 2021-05-02 | Disposition: A | Discharge status: Home / Self Care | Payer: 59 | Source: Ambulatory Visit | Attending: Family Medicine | Admitting: Family Medicine

## 2021-05-02 DIAGNOSIS — I6782 Cerebral ischemia: Secondary | ICD-10-CM | POA: Insufficient documentation

## 2021-05-02 DIAGNOSIS — G9389 Other specified disorders of brain: Secondary | ICD-10-CM | POA: Insufficient documentation

## 2021-05-02 DIAGNOSIS — R1310 Dysphagia, unspecified: Secondary | ICD-10-CM | POA: Diagnosis not present

## 2021-05-02 DIAGNOSIS — I639 Cerebral infarction, unspecified: Secondary | ICD-10-CM

## 2021-05-02 DIAGNOSIS — G8191 Hemiplegia, unspecified affecting right dominant side: Secondary | ICD-10-CM | POA: Diagnosis not present

## 2021-05-02 DIAGNOSIS — R29818 Other symptoms and signs involving the nervous system: Secondary | ICD-10-CM | POA: Diagnosis not present

## 2021-05-02 MED ORDER — GADOBUTROL 1 MMOL/ML IV SOLN
7.0000 mL | Freq: Once | INTRAVENOUS | Status: AC | PRN
  Administered 2021-05-02: 7 mL via INTRAVENOUS

## 2021-05-05 ENCOUNTER — Other Ambulatory Visit: Payer: Self-pay

## 2021-05-05 MED ORDER — ASPIRIN EC 325 MG PO TBEC: 325.0000 mg | DELAYED_RELEASE_TABLET | Freq: Every day | ORAL | 0 refills | Status: DC

## 2021-05-06 ENCOUNTER — Encounter: Payer: Self-pay | Admitting: Family Medicine

## 2021-05-06 ENCOUNTER — Telehealth: Payer: Self-pay

## 2021-05-06 ENCOUNTER — Telehealth: Payer: Self-pay | Admitting: Family Medicine

## 2021-05-06 NOTE — Telephone Encounter (Signed)
Betsy with [facility] calling to get verbal orders for Physical therapy twice a week for 3 weeks and then once a week for 3 weeks. Callback [phone] Direct number

## 2021-05-06 NOTE — Telephone Encounter (Signed)
Patient's daughter has called back.  States she was informed to reach out to [facility] to schedule with neurology since patient is a current patient. States she reached out to [facility].  States [facility] informed her that they still need referral sent.  Referral was sent to [facility].  Please route referral over to [facility] and follow back up with daughter once sent.

## 2021-05-07 NOTE — Telephone Encounter (Signed)
Called and lm on [place] confidential vm with VO.

## 2021-05-09 ENCOUNTER — Telehealth: Payer: Self-pay

## 2021-05-09 ENCOUNTER — Other Ambulatory Visit: Payer: Self-pay | Admitting: Acute Care

## 2021-05-09 DIAGNOSIS — Z87891 Personal history of nicotine dependence: Secondary | ICD-10-CM

## 2021-05-09 DIAGNOSIS — F17210 Nicotine dependence, cigarettes, uncomplicated: Secondary | ICD-10-CM

## 2021-05-09 NOTE — Telephone Encounter (Signed)
Patient's spouse has called in stating Dr. Yong Channel took patient off of RA medication due to current health concerns.    States the specialist for his RA has recently prescribed prednisone.    Would like to know if Dr. Yong Channel is [facility] with patient taking the prednisone?

## 2021-05-09 NOTE — Telephone Encounter (Signed)
Called and spoke with [name] and made her aware.

## 2021-05-09 NOTE — Telephone Encounter (Signed)
Yes prednisone is reasonable

## 2021-05-09 NOTE — Telephone Encounter (Signed)
See below

## 2021-05-16 ENCOUNTER — Ambulatory Visit (INDEPENDENT_AMBULATORY_CARE_PROVIDER_SITE_OTHER): Payer: 59 | Admitting: Neurology

## 2021-05-16 ENCOUNTER — Encounter: Payer: Self-pay | Admitting: Neurology

## 2021-05-16 VITALS — BP 130/76 | HR 88 | Ht 68.0 in | Wt 162.0 lb

## 2021-05-16 DIAGNOSIS — R269 Unspecified abnormalities of gait and mobility: Secondary | ICD-10-CM

## 2021-05-16 DIAGNOSIS — S0990XA Unspecified injury of head, initial encounter: Secondary | ICD-10-CM | POA: Insufficient documentation

## 2021-05-16 DIAGNOSIS — Z8673 Personal history of transient ischemic attack (TIA), and cerebral infarction without residual deficits: Secondary | ICD-10-CM | POA: Insufficient documentation

## 2021-05-16 DIAGNOSIS — I639 Cerebral infarction, unspecified: Secondary | ICD-10-CM

## 2021-05-16 DIAGNOSIS — S0990XS Unspecified injury of head, sequela: Secondary | ICD-10-CM | POA: Diagnosis not present

## 2021-05-16 NOTE — Progress Notes (Signed)
Chief Complaint  Patient presents with   Rm 15    Here with daughter for stroke. He states he was told he had two, one recently and then one awhile back. Daughter states old one was in right cerebellum. MR angio says new one is in the left pons. Pt also wants to note his neck is always sore. Pt states his right side is better but not completely better. He doesn't use his walker unless he has to. Daughter speech was very slurred but is much better. He doesn't feel speech therapy is helpful.       ASSESSMENT AND PLAN  William Gibson is a 76 y.o. male   Acute left pontine stroke in December 2022,  Mild residual spastic right hemiparesis, improved  Aspirin 81 mg daily  Vascular risk factor of aging, longtime smoker, alcohol use, hyperlipidemia  MRA of head and neck showed no large vessel disease,  Cardiology appointment pending,  Emphasized the importance of smoking and alcohol cessation  History of traumatic brain injury  Evidence of bifrontal, right temporal tip encephalomalacia,  At risk for partial seizure,  Advised patient and his family carefully observing his symptoms, call clinic for confusion spells,  Cognitive impairment  MoCA examination 22/30, missed 5 out of 5 recalls, mild visual spatial disorientation   DIAGNOSTIC DATA (LABS, IMAGING, TESTING) - I reviewed patient records, labs, notes, testing and imaging myself where available. MRI brain, MRA neck and head in Jan 2023. Suspect subacute infarct of the parasagittal left pons.  Moderate chronic microvascular ischemic changes. Bifrontal and anterior temporal encephalomalacia. Chronic right cerebellar infarct.  No large vessel occlusion or hemodynamically significant stenosis.     MEDICAL HISTORY:  William Gibson is a 76 year old male,, seen in request by his primary care physician Dr. Yong Channel, [facility] for evaluation of memory loss, he is accompanied by his daughter at today's visit on May 16, 2021, the other  daughter is connected through [place] [facility],  I reviewed and summarized the referring note. PMHX. HLD Alcohol abuse Rheumatoid arthritis Smoke 1 ppd x60 years  Around Christmas time, he was noted to have sudden onset slurred speech, right arm and leg weakness, leading to MRI of the brain ordered by his primary care physician, I personally reviewed the film, positive DWI lesion at left parasagittal pons, periventricular moderate small vessel disease, right cerebellar chronic infarction.  In addition bifrontal right anterior temporal encephalomalacia, there is no evidence of large vessel disease.  Patient does report a history of traumatic brain injury in July 2021, fell backwards, landed on occipital region, with prolonged loss of consciousness,  He was not on antiplatelet agent, not taking aspirin 325 mg daily, cardiology appointment pending  Over the past few weeks, his slurred speech improved to baseline, improvement of right arm and leg weakness.  He also had a history of rheumatoid arthritis, multiple joints pain, under the care of rheumatologist, was previously treated with daily meloxicam, now stopped.  He was noted to have mild memory loss, MoCA 22/30 today, missed 5 out of 5 recall,  He used to drink a gallon of hard liquor a week, now he only drinks mild amount mixed with soft drinks, still smokes three-quarter pack a day    PHYSICAL EXAM:   Vitals:   05/16/21 1052  BP: 130/76  Pulse: 88  Weight: 162 lb (73.5 kg)  Height: 5' 8" (1.727 m)   Not recorded     Body mass index is 24.63 kg/m.  PHYSICAL EXAMNIATION:  Gen: NAD, conversant, well nourised, well groomed                     Cardiovascular: Regular rate rhythm, no peripheral edema, warm, nontender. Eyes: Conjunctivae clear without exudates or hemorrhage Neck: Supple, no carotid bruits. Pulmonary: Clear to auscultation bilaterally   NEUROLOGICAL EXAM:  MENTAL STATUS: Speech:    Speech is normal; fluent and  spontaneous with normal comprehension.  Cognition:      Montreal Cognitive Assessment  05/16/2021  Visuospatial/ Executive (0/5) 4  Naming (0/3) 3  Attention: Read list of digits (0/2) 2  Attention: Read list of letters (0/1) 1  Attention: Serial 7 subtraction starting at 100 (0/3) 3  Language: Repeat phrase (0/2) 2  Language : Fluency (0/1) 0  Abstraction (0/2) 2  Delayed Recall (0/5) 0  Orientation (0/6) 5  Total 22      CRANIAL NERVES: CN II: Visual fields are full to confrontation. Pupils are round equal and briskly reactive to light. CN III, IV, VI: extraocular movement are normal. No ptosis. CN V: Facial sensation is intact to light touch CN VII: Face is symmetric with normal eye closure  CN VIII: Hearing is normal to causal conversation. CN IX, X: Phonation is normal. CN XI: Head turning and shoulder shrug are intact  MOTOR: There is no pronator drift of out-stretched arms. Muscle bulk and tone are normal. Muscle strength is normal.  REFLEXES: Reflexes are 2+ and symmetric at the biceps, triceps, knees, and ankles. Plantar responses are flexor.  SENSORY: Intact to light touch, pinprick and vibratory sensation are intact in fingers and toes.  COORDINATION: There is no trunk or limb dysmetria noted.  GAIT/STANCE: Posture is normal. Gait is steady with normal steps, base, arm swing, and turning. Heel and toe walking are normal. Tandem gait is normal.  Romberg is absent.  REVIEW OF SYSTEMS:  Full 14 system review of systems performed and notable only for as above All other review of systems were negative.   ALLERGIES: No Known Allergies  HOME MEDICATIONS: Current Outpatient Medications  Medication Sig Dispense Refill   aspirin EC 325 MG tablet Take 1 tablet (325 mg total) by mouth daily. 30 tablet 0   CIMZIA PREFILLED 2 X 200 MG/ML KIT Inject into the skin.     folic acid (FOLVITE) 1 MG tablet Take 1 tablet (1 mg total) by mouth daily. 30 tablet 0   Multiple  Vitamin (MULTIVITAMIN PO) Take by mouth.     omeprazole (PRILOSEC) 10 MG capsule Take 1 capsule (10 mg total) by mouth daily. 90 capsule 3   rosuvastatin (CRESTOR) 5 MG tablet Take 1 tablet (5 mg total) by mouth daily. 90 tablet 3   Cyanocobalamin (VITAMIN B-12 PO) Take 1 tablet by mouth daily. (Patient not taking: Reported on 05/16/2021)     meloxicam (MOBIC) 15 MG tablet Take 15 mg by mouth daily. (Patient not taking: Reported on 05/16/2021)     thiamine (VITAMIN B-1) 100 MG tablet Take 1 tablet (100 mg total) by mouth daily. (Patient not taking: Reported on 05/16/2021) 30 tablet 0   No current facility-administered medications for this visit.    PAST MEDICAL HISTORY: Past Medical History:  Diagnosis Date   Abnormal liver function tests 07/24/760   Alcoholic liver disease ([place]) 12/15/2018   Alcoholism ([place])    Elevated LDL cholesterol level 05/22/2013   Epistaxis    Erectile dysfunction 05/22/2013   Frequent falls 11/09/2018   GERD (gastroesophageal reflux disease)  Hyperammonemia ([place]) 11/15/2018   Memory change 08/17/2017   RA (rheumatoid arthritis) ([place]) 08/17/2017   RBBB 12/15/2018   Rheumatoid arthritis ([place]) 2010   Shuffling gait 12/19/2018   Tobacco abuse 04/26/2012   Weight loss, unintentional 11/09/2018   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 12/15/2018    PAST SURGICAL HISTORY: Past Surgical History:  Procedure Laterality Date   NASAL ENDOSCOPY WITH EPISTAXIS CONTROL N/A 04/12/2018   Procedure: NASAL ENDOSCOPY WITH EPISTAXIS CONTROL;  Surgeon: Melissa Montane, MD;  Location: [facility];  Service: ENT;  Laterality: N/A;   VASECTOMY     VASECTOMY REVERSAL      FAMILY HISTORY: Family History  Problem Relation Age of Onset   Heart disease Mother    Lung cancer Father    Heart attack Father    CAD Brother    Aneurysm Brother    Colon cancer Neg Hx    Esophageal cancer Neg Hx    Stomach cancer Neg Hx     SOCIAL HISTORY: Social History   Socioeconomic History    Marital status: Married    Spouse name: Not on file   Number of children: Not on file   Years of education: Not on file   Highest education level: Not on file  Occupational History   Occupation: retired  Tobacco Use   Smoking status: Every Day    Packs/day: 1.50    Years: 58.00    Pack years: 87.00    Types: Cigarettes   Smokeless tobacco: Never   Tobacco comments:    Pt states he smokes 3/4 of a  ppd   Vaping Use   Vaping Use: Never used  Substance and Sexual Activity   Alcohol use: Not Currently    Comment: 1/5 whiskey a day prior; none in last two weeks as of 05/16/21   Drug use: No   Sexual activity: Not Currently  Other Topics Concern   Not on file  Social History Narrative   Married. 3 children. 8 grandkids. 1 greatgrandkid.       Retired from being self employed Psychologist, counselling         Does not have a living will.   Desires CPR, does not want prolonged life support if futile.         05/16/2021   Caffeine: "a little bit", some days none, sometimes 1 cup   Social Determinants of Health   Financial Resource Strain: Not on file  Food Insecurity: Not on file  Transportation Needs: Not on file  Physical Activity: Not on file  Stress: Not on file  Social Connections: Not on file  Intimate Partner Violence: Not on file      Marcial Pacas, M.D. Ph.D.  [facility] [address] [place], [place] [address] Ph: [phone] Fax: [phone]  CC:  Marin Olp, [place],  [place] [address]  Marin Olp, MD

## 2021-05-20 ENCOUNTER — Other Ambulatory Visit: Payer: Self-pay

## 2021-05-20 ENCOUNTER — Ambulatory Visit (INDEPENDENT_AMBULATORY_CARE_PROVIDER_SITE_OTHER): Payer: 59 | Admitting: Family Medicine

## 2021-05-20 ENCOUNTER — Encounter: Payer: Self-pay | Admitting: Family Medicine

## 2021-05-20 VITALS — BP 118/72 | HR 72 | Temp 98.1°F | Ht 68.0 in | Wt 165.0 lb

## 2021-05-20 DIAGNOSIS — M722 Plantar fascial fibromatosis: Secondary | ICD-10-CM | POA: Insufficient documentation

## 2021-05-20 DIAGNOSIS — Z8673 Personal history of transient ischemic attack (TIA), and cerebral infarction without residual deficits: Secondary | ICD-10-CM

## 2021-05-20 DIAGNOSIS — J449 Chronic obstructive pulmonary disease, unspecified: Secondary | ICD-10-CM | POA: Diagnosis not present

## 2021-05-20 DIAGNOSIS — Z9181 History of falling: Secondary | ICD-10-CM

## 2021-05-20 DIAGNOSIS — F325 Major depressive disorder, single episode, in full remission: Secondary | ICD-10-CM

## 2021-05-20 DIAGNOSIS — F1021 Alcohol dependence, in remission: Secondary | ICD-10-CM

## 2021-05-20 DIAGNOSIS — M0630 Rheumatoid nodule, unspecified site: Secondary | ICD-10-CM | POA: Insufficient documentation

## 2021-05-20 DIAGNOSIS — Z7982 Long term (current) use of aspirin: Secondary | ICD-10-CM

## 2021-05-20 DIAGNOSIS — Z79899 Other long term (current) drug therapy: Secondary | ICD-10-CM | POA: Insufficient documentation

## 2021-05-20 DIAGNOSIS — I639 Cerebral infarction, unspecified: Secondary | ICD-10-CM

## 2021-05-20 DIAGNOSIS — I700 Atherosclerosis of aorta: Secondary | ICD-10-CM

## 2021-05-20 DIAGNOSIS — E782 Mixed hyperlipidemia: Secondary | ICD-10-CM

## 2021-05-20 DIAGNOSIS — K219 Gastro-esophageal reflux disease without esophagitis: Secondary | ICD-10-CM

## 2021-05-20 DIAGNOSIS — M069 Rheumatoid arthritis, unspecified: Secondary | ICD-10-CM

## 2021-05-20 DIAGNOSIS — I69391 Dysphagia following cerebral infarction: Secondary | ICD-10-CM | POA: Diagnosis not present

## 2021-05-20 DIAGNOSIS — I6200 Nontraumatic subdural hemorrhage, unspecified: Secondary | ICD-10-CM | POA: Insufficient documentation

## 2021-05-20 DIAGNOSIS — I69351 Hemiplegia and hemiparesis following cerebral infarction affecting right dominant side: Secondary | ICD-10-CM | POA: Diagnosis not present

## 2021-05-20 DIAGNOSIS — K709 Alcoholic liver disease, unspecified: Secondary | ICD-10-CM

## 2021-05-20 MED ORDER — PREDNISONE 5 MG PO TABS: 5.0000 mg | ORAL_TABLET | Freq: Every day | ORAL | 0 refills | Status: DC

## 2021-05-20 MED ORDER — OMEPRAZOLE 20 MG PO CPDR: 20.0000 mg | DELAYED_RELEASE_CAPSULE | Freq: Every day | ORAL | 3 refills | Status: DC

## 2021-05-20 NOTE — Patient Instructions (Addendum)
Glad you are doing better and have upcoming PT  Short term very low dose prednisone- interested to see what Dr. Trudie Reed says  Refilled reflux meds  Thanks for seeing neurology and for keeping upcoming cardiology appointment  If you have new or worsening symptoms let us know  Recommended follow up: Return in about 3 months (around 08/18/2021) for follow up- or sooner if needed.  Pretty please quit smoking!

## 2021-05-20 NOTE — Progress Notes (Signed)
Phone [phone] In person visit   Subjective:   William Gibson is a 76 y.o. year old very pleasant male patient who presents for/with See problem oriented charting Chief Complaint  Patient presents with   Follow-up    This visit occurred during the SARS-CoV-2 public health emergency.  Safety protocols were in place, including screening questions prior to the visit, additional usage of staff PPE, and extensive cleaning of exam room while observing appropriate contact time as indicated for disinfecting solutions.   Past Medical History-  Patient Active Problem List   Diagnosis Date Noted   History of stroke 05/16/2021    Priority: High   History of subarachnoid hemorrhage 11/04/2019    Priority: High   Head injury 11/04/2019    Priority: High   Alcoholic liver disease ([place]) 12/15/2018    Priority: High   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 12/15/2018    Priority: High   RA (rheumatoid arthritis) ([place]) 08/17/2017    Priority: High   Alcohol dependence in early full remission ([place]) 12/15/2016    Priority: High   Tobacco abuse 04/26/2012    Priority: High   Aortic atherosclerosis ([place]) 06/03/2020    Priority: Medium    Emphysema lung ([place]) 06/03/2020    Priority: Medium    Major depression in full remission ([place]) 01/12/2020    Priority: Medium    Insomnia 01/12/2020    Priority: Medium    Vascular headache     Priority: Medium    Hyperlipidemia 02/06/2019    Priority: Medium    Shuffling gait 12/19/2018    Priority: Medium    Frequent falls 11/09/2018    Priority: Medium    Memory change 08/17/2017    Priority: Medium    GERD (gastroesophageal reflux disease) 05/23/2014    Priority: Medium    History of pancreatitis 01/12/2020    Priority: Low   Hyponatremia     Priority: Low   RBBB 12/15/2018    Priority: Low   Hyperammonemia ([place]) 11/15/2018    Priority: Low   Osteoarthritis 01/30/2016    Priority: Low   Erectile dysfunction 05/22/2013     Priority: Low   Subdural bleeding ([place]) 05/20/2021   Rheumatoid nodule ([place]) 05/20/2021   Other long term (current) drug therapy 05/20/2021   Plantar fasciitis 05/20/2021   Head trauma 05/16/2021   Gait abnormality 05/16/2021   Post-traumatic subdural hematoma 01/10/2020   Chronic post-traumatic headache 12/13/2019    Medications- reviewed and updated Current Outpatient Medications  Medication Sig Dispense Refill   aspirin EC 325 MG tablet Take 1 tablet (325 mg total) by mouth daily. 30 tablet 0   CIMZIA PREFILLED 2 X 200 MG/ML KIT Inject into the skin.     folic acid (FOLVITE) 1 MG tablet Take 1 tablet (1 mg total) by mouth daily. 30 tablet 0   mirtazapine (REMERON) 15 MG tablet Take 15 mg by mouth at bedtime.     Multiple Vitamin (MULTIVITAMIN PO) Take by mouth.     omeprazole (PRILOSEC OTC) 20 MG tablet 1 tablet     omeprazole (PRILOSEC) 20 MG capsule Take 1 capsule (20 mg total) by mouth daily. 90 capsule 3   predniSONE (DELTASONE) 5 MG tablet Take 1 tablet (5 mg total) by mouth daily with breakfast. 5 tablet 0   rosuvastatin (CRESTOR) 5 MG tablet Take 1 tablet (5 mg total) by mouth daily. 90 tablet 3   Cyanocobalamin (VITAMIN B-12 PO) Take 1 tablet by mouth daily. (  Patient not taking: Reported on 05/16/2021)     thiamine (VITAMIN B-1) 100 MG tablet Take 1 tablet (100 mg total) by mouth daily. (Patient not taking: Reported on 05/16/2021) 30 tablet 0   No current facility-administered medications for this visit.     Objective:  BP 118/72 (BP Location: Left Arm, Patient Position: Sitting, Cuff Size: Large)    Pulse 72    Temp 98.1 F (36.7 C) (Temporal)    Ht _0  (1.727 m)    Wt 165 lb (74.8 kg)    SpO2 94%    BMI 25.09 kg/m  Gen: NAD, resting comfortably CV: RRR no murmurs rubs or gallops Lungs: CTAB no crackles, wheeze, rhonchi Abdomen: soft/nontender/nondistended/normal bowel sounds.  Ext: no edema Skin: warm, dry Neuro: 4/5 strength on right- 5/5 on the left.      Assessment and Plan   #History of stroke-detected due to new onset right hemiparesis S: Patient presented on 04/29/2021 with new onset right hemiparesis for approximately 2 weeks suspicious for stroke.  Head CT without acute bleed-done due to history of subarachnoid hemorrhage.  Follow-up MRI showed "IMPRESSION: Suspect subacute infarct of the parasagittal left pons.  Moderate chronic microvascular ischemic changes. Bifrontal and anterior temporal encephalomalacia. Chronic right cerebellar infarct."  Patient was continued on his stat in-rosuvastatin 5 mg-he was started on aspirin 325 mg but neurology reduced to 81 mg at recent visit.  He was referred to home health as well as neurology and cardiology.  He has a visit with cardiology next month but does not see neurology again until July.   Had initial PT visit but then got delayed due to patients wife with covid. His strength is mildly improved and speech has improved (he is declining speech therapy) A/P: history of stroke- has seen neurology and they adjusted aspirin back down to 81 mg and continued statin. No indication for carotid dopplers since had MRA neck. In regards to possible a fib- has upcoming visit with cardiology for their opinion  - also encouraged smoking cessation- about 3/4 PPD -speech better and declines speech therapy -do want him to work with PT as note 4/5 weakness on right- particularly grip strength  #Alcoholism in remission S: prior patient has not had a drink since November 03, 2019-major fall with head trauma/skull fracture/subarachnoid hemorrhage.    Since last visit has not had large quantity of alcohol maybe 1-2 oz per day - uses to cut v8 juice.   A/P: encouraged full cessation- best thing for long term health.   # Hyperglycemia/insulin resistance/prediabetes S:  Medication: none Exercise and diet- trying to walk around house Lab Results  Component Value Date   HGBA1C 6.1 04/29/2021   HGBA1C 5.9 11/10/2018    A/P: does have prediabetes - work on healthy eating/regular exercise as he recovers   #Hyperlipidemia/aortic atherosclerosis S: CAD on lung cancer screening x 2 vessels.  On rosuvastatin 5 mg LDL has been at goal on meds and poor control off- has restarted since last visit Lab Results  Component Value Date   CHOL 162 12/04/2020   HDL 78.00 12/04/2020   LDLCALC 67 12/04/2020   LDLDIRECT 152.0 04/29/2021   TRIG 83.0 12/04/2020   CHOLHDL 2 12/04/2020    A/P:we will plan at follow up to update LDL to make sure back under 70.    # GERD S:Medication: omeprazole 20 mg . If misses dose has worsening symptoms- does well on meds. On 10 mg also had breakthrough.  A/P: Reflux reasonably well  controlled while on medication-we discussed ideally would get to lowest effective dose but since he is having some breakthrough when he misses doses or he can only try the 10 mg we will continue current dose.  # Rheumatoid arthritis S:ideally would be off meloxicam with stroke history. Follows with Dr. Trudie Reed has appointment on 30th or 31st. Still doing cimzia. Has bad hand pain since coming off and swelling and worsening neck pain.   Prednisone was called in and was helpful and helpful as 12 day supply- has been taking 81m every other day basically to space this out.  A/P: has upcoming rheum visit but not tolerating being off mobic- wants short term prednisone- will taper further down with 5 mg x 5 days.    Recommended follow up: Return in about 3 months (around 08/18/2021) for follow up- or sooner if needed. Future Appointments  Date Time Provider [place] 06/11/2021  1:00 PM [facility] 1 [facility]  06/13/2021 10:20 AM Nahser, PWonda Cheng MD [facility]  11/20/2021  2:15 PM SSuzzanne Cloud NP [facility] None   Lab/Order associations:   ICD-10-CM   1. History of stroke  Z86.73     2. Rheumatoid arthritis, involving unspecified site, unspecified whether rheumatoid factor  present ([place]  M06.9     3. Alcohol dependence in early full remission ([place]  F10.21     4. Mixed hyperlipidemia  E78.2     5. Aortic atherosclerosis (H[facility])  I70.0       Meds ordered this encounter  Medications   omeprazole (PRILOSEC) 20 MG capsule    Sig: Take 1 capsule (20 mg total) by mouth daily.    Dispense:  90 capsule    Refill:  3   predniSONE (DELTASONE) 5 MG tablet    Sig: Take 1 tablet (5 mg total) by mouth daily with breakfast.    Dispense:  5 tablet    Refill:  0    Return precautions advised.  SGarret Reddish MD

## 2021-05-22 ENCOUNTER — Telehealth: Payer: Self-pay | Admitting: Family Medicine

## 2021-05-22 NOTE — Telephone Encounter (Signed)
Copied from [place] [id number]. Topic: Medicare AWV >> May 22, 2021 10:44 AM Harris-Coley, Hannah Beat wrote: Reason for CRM:  Left message for patient to schedule Annual Wellness Visit.  Please schedule with Nurse Health Advisor Charlott Rakes, RN at [facility].  Please call [phone] ask for [facility]

## 2021-05-27 ENCOUNTER — Telehealth: Payer: Self-pay | Admitting: Family Medicine

## 2021-05-27 DIAGNOSIS — M0579 Rheumatoid arthritis with rheumatoid factor of multiple sites without organ or systems involvement: Secondary | ICD-10-CM | POA: Diagnosis not present

## 2021-05-27 DIAGNOSIS — M2550 Pain in unspecified joint: Secondary | ICD-10-CM | POA: Diagnosis not present

## 2021-05-27 DIAGNOSIS — Z6824 Body mass index (BMI) 24.0-24.9, adult: Secondary | ICD-10-CM | POA: Diagnosis not present

## 2021-05-27 DIAGNOSIS — M150 Primary generalized (osteo)arthritis: Secondary | ICD-10-CM | POA: Diagnosis not present

## 2021-05-27 DIAGNOSIS — Z79899 Other long term (current) drug therapy: Secondary | ICD-10-CM | POA: Diagnosis not present

## 2021-05-27 NOTE — Telephone Encounter (Signed)
See below

## 2021-05-27 NOTE — Telephone Encounter (Signed)
Meloxicam increasing stroke risk and he had a recent stroke so we preferred for him to be off of this if at all possible

## 2021-05-27 NOTE — Telephone Encounter (Signed)
Pt's daughter states pt' rheumatologist is wanting to know why pt was taken off Meloxicam 15 mg tablet. They are also wanting all of his recent test results sent over to [facility].  Fax: [phone] Attn: Gavin Pound

## 2021-05-28 NOTE — Telephone Encounter (Signed)
Explanation of Meloxicam and recent labs faxed to [facility].

## 2021-06-11 ENCOUNTER — Inpatient Hospital Stay: Admission: RE | Admit: 2021-06-11 | Payer: 59 | Source: Ambulatory Visit

## 2021-06-13 ENCOUNTER — Ambulatory Visit: Payer: 59 | Admitting: Cardiovascular Disease

## 2021-06-24 ENCOUNTER — Telehealth: Payer: Self-pay | Admitting: Family Medicine

## 2021-06-24 NOTE — Telephone Encounter (Signed)
Alm[place], Physical Therapist with HomeHealth, states pt has cancelled pt visits for the last 4 weeks. Family is requesting to be discharged from [facility] pt. Any questions, please call Almira at [phone]

## 2021-06-24 NOTE — Telephone Encounter (Signed)
Noted  

## 2021-07-02 ENCOUNTER — Inpatient Hospital Stay: Admission: RE | Admit: 2021-07-02 | Payer: 59 | Source: Ambulatory Visit

## 2021-07-28 ENCOUNTER — Encounter: Payer: Self-pay | Admitting: Cardiovascular Disease

## 2021-07-28 NOTE — Progress Notes (Signed)
This encounter was created in error - please disregard.

## 2021-07-29 ENCOUNTER — Encounter: Payer: 59 | Admitting: Cardiovascular Disease

## 2021-08-18 ENCOUNTER — Ambulatory Visit: Payer: 59 | Admitting: Family Medicine

## 2021-09-11 ENCOUNTER — Ambulatory Visit: Payer: 59 | Admitting: Family Medicine

## 2021-09-17 ENCOUNTER — Telehealth: Payer: Self-pay | Admitting: Neurology

## 2021-09-17 ENCOUNTER — Encounter: Payer: Self-pay | Admitting: Neurology

## 2021-09-17 MED ORDER — OXYBUTYNIN CHLORIDE ER 5 MG PO TB24: 5.0000 mg | ORAL_TABLET | Freq: Every day | ORAL | 6 refills | Status: DC

## 2021-09-17 NOTE — Telephone Encounter (Signed)
Meds ordered this encounter  Medications   oxybutynin (DITROPAN-XL) 5 MG 24 hr tablet    Sig: Take 1 tablet (5 mg total) by mouth at bedtime.    Dispense:  30 tablet    Refill:  6  See my chart message, oxybutynin for urinary urgency, incontinence, also suggested a high-fiber for bowel incontinence

## 2021-09-26 IMAGING — CT CT CERVICAL SPINE W/O CM
3 of 4 series · 13 of 33 positions shown, 16 images · non-contrast
Comparison: None.

CLINICAL DATA: Status post fall.

EXAM:
CT CERVICAL SPINE WITHOUT CONTRAST
TECHNIQUE: Multidetector CT imaging of the cervical spine was performed without
intravenous contrast. Multiplanar CT image reconstructions were also
generated.

[Series 4: c_spine 2.0 st · axial · 0.35mm/px · z∈[+1226,+1370]mm · 5 of 108 slices shown, 7 images]
[im 18/108  soft-tissue]
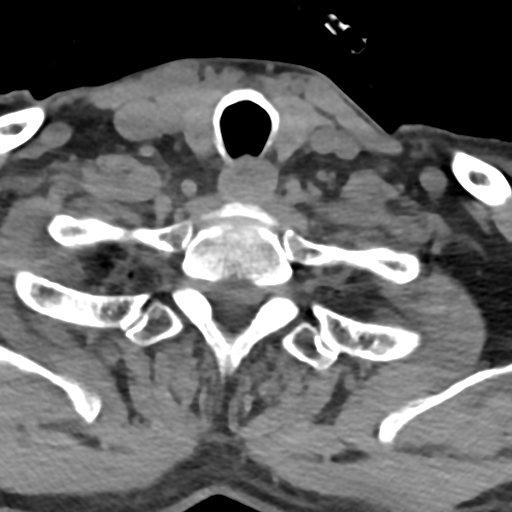
[im 18/108  bone]
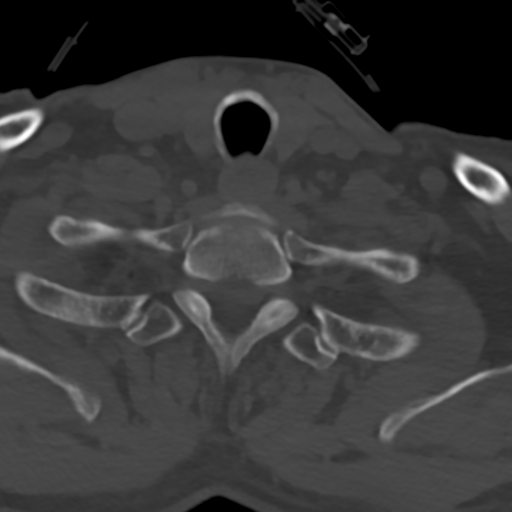
[im 36/108  bone]
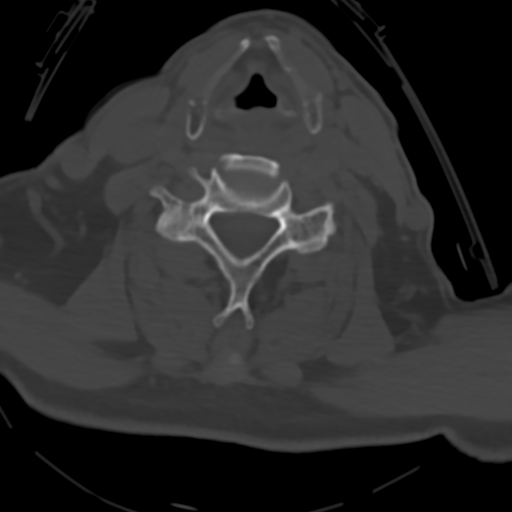
[im 54/108  bone]
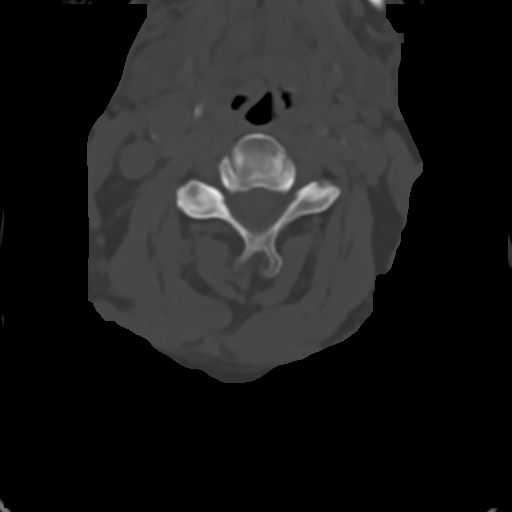
[im 72/108  bone]
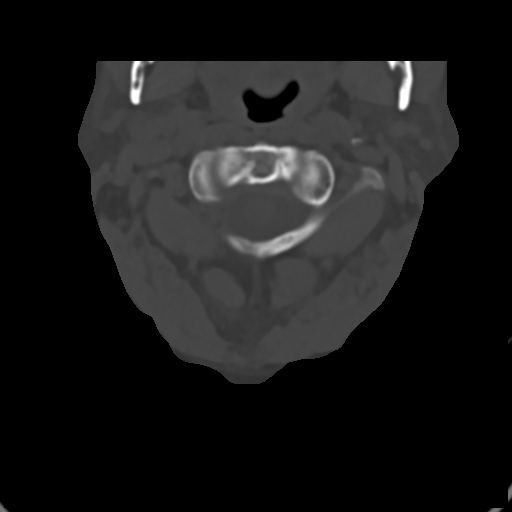
[im 90/108  soft-tissue]
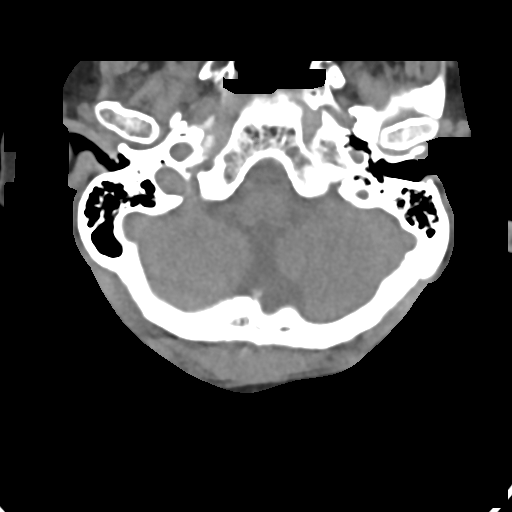
[im 90/108  bone]
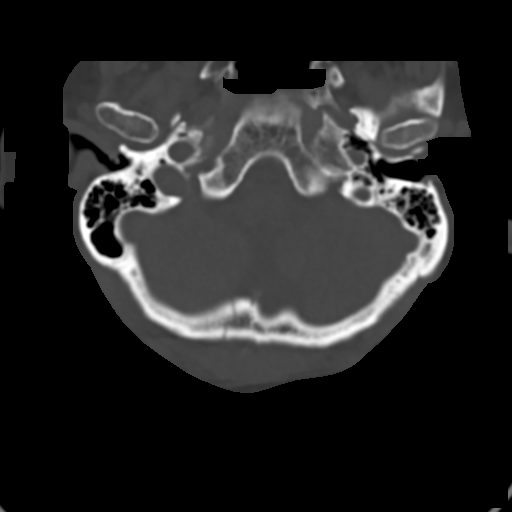

[Series 6: c_spine 2.0 sag bone · sagittal · 0.42mm/px · 5 of 61 slices shown, 6 images]
[im 21/61  bone]
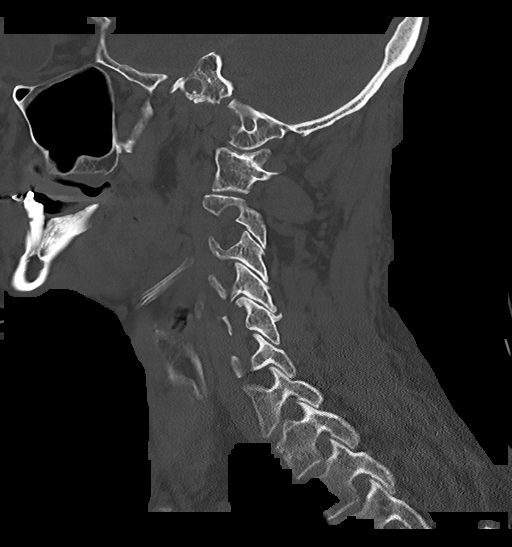
[im 26/61  bone]
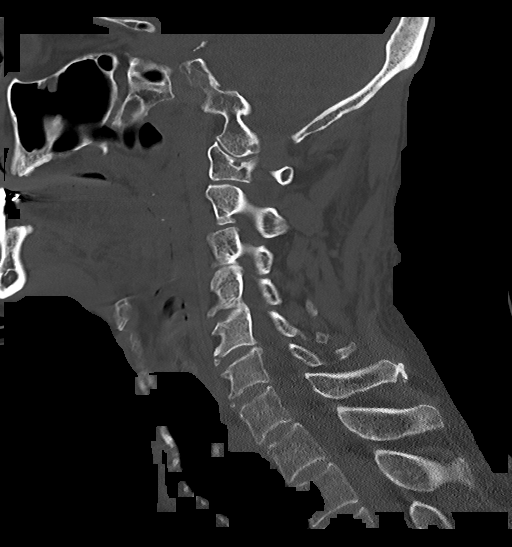
[im 31/61  soft-tissue]
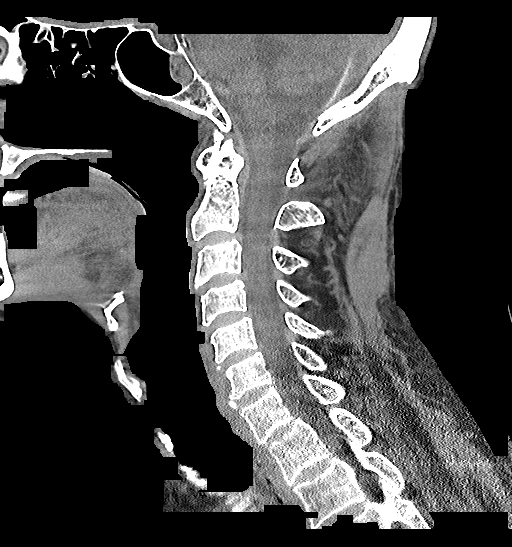
[im 31/61  bone]
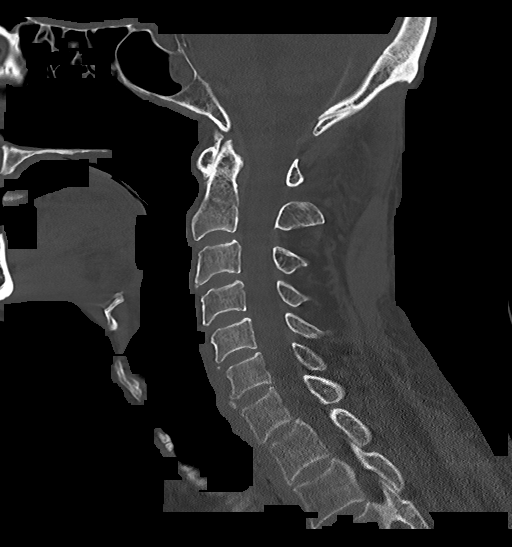
[im 36/61  bone]
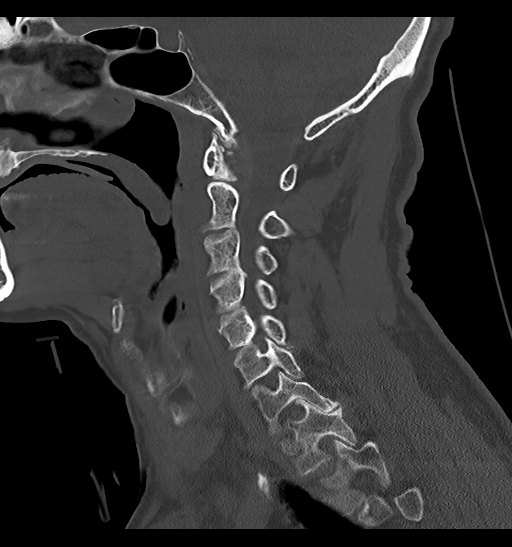
[im 41/61  bone]
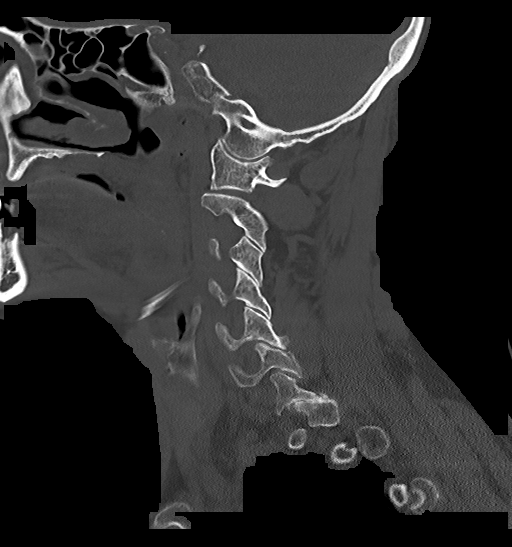

[Series 7: c_spine 2.0 cor bone · coronal · 0.32mm/px · 3 of 61 slices shown]
[im 13/61  bone]
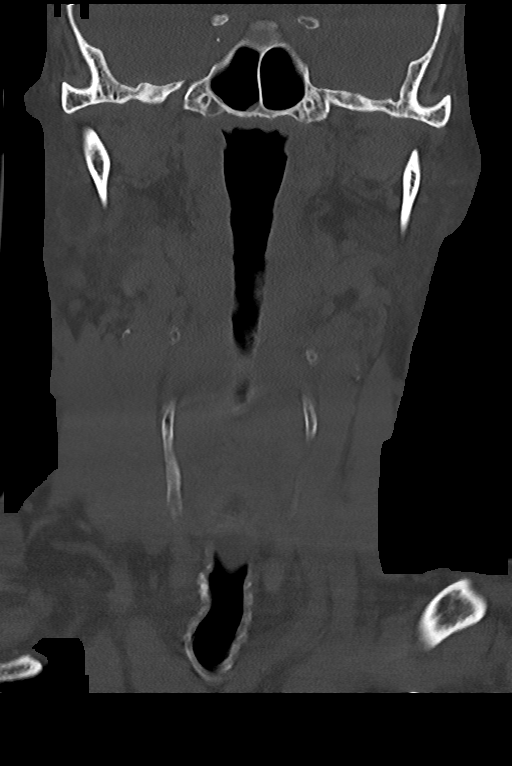
[im 25/61  bone]
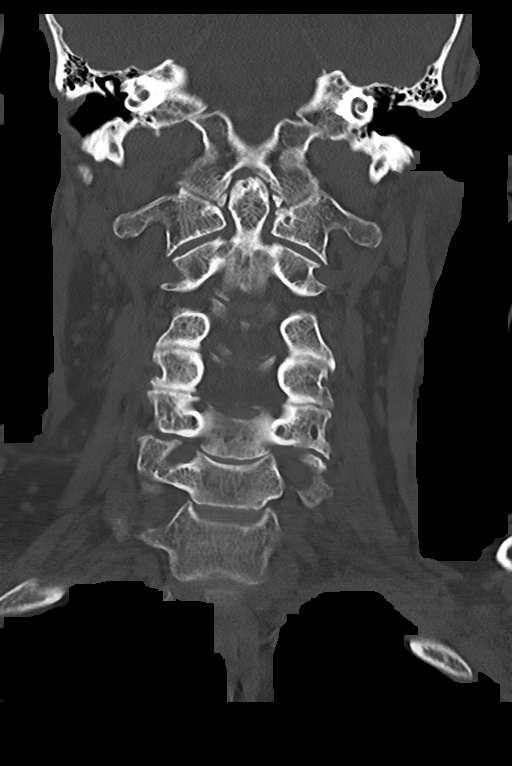
[im 37/61  bone]
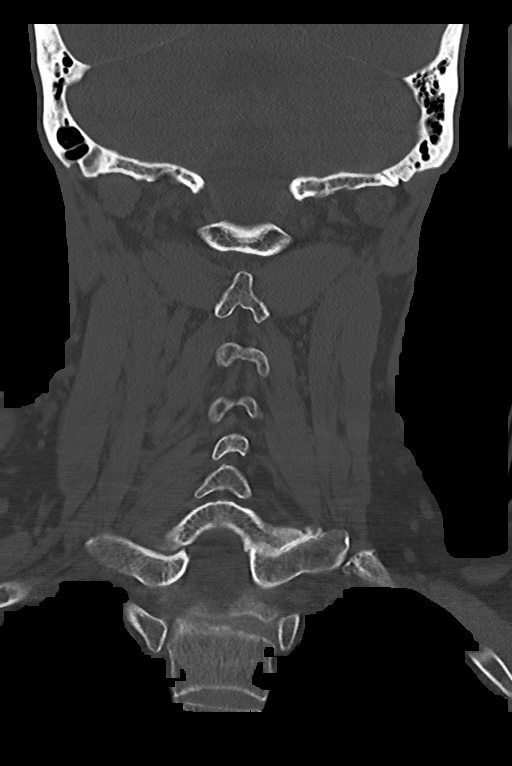

[13 of 33 positions shown; findings below may reference images not displayed]

FINDINGS: Alignment: Normal.

Skull base and vertebrae: No acute fracture involving the cervical
spine. An acute nondisplaced fracture is seen involving the
occipital region of the skull, to the right of midline. This extends
inferiorly along the skull base to the level of the foramen magnum.

Soft tissues and spinal canal: No prevertebral fluid or swelling. No
visible canal hematoma.

Disc levels: There is marked severity narrowing of the anterior
atlanto axial articulation. Mild anterior osteophyte formation is
seen at the levels of C3-C4, C4-C5, C5-C6 and C6-C7.

Normal multilevel intervertebral disc spaces are seen.

Mild bilateral multilevel facet joint hypertrophy is seen.

Upper chest: Mild to moderate severity chronic appearing increased
lung markings are present.

Other: None.
IMPRESSION: 1. Acute nondisplaced fracture involving the occipital region of the
skull, to the right of midline. This extends inferiorly along the
skull base to the level of the foramen magnum.
2. No acute fracture involving the cervical spine.

## 2021-10-08 ENCOUNTER — Ambulatory Visit: Payer: 59 | Admitting: Family Medicine

## 2021-10-20 ENCOUNTER — Ambulatory Visit: Payer: 59 | Admitting: Family Medicine

## 2021-10-21 ENCOUNTER — Encounter: Payer: Self-pay | Admitting: Family Medicine

## 2021-10-21 ENCOUNTER — Ambulatory Visit (INDEPENDENT_AMBULATORY_CARE_PROVIDER_SITE_OTHER): Payer: 59 | Admitting: Family Medicine

## 2021-10-21 VITALS — BP 112/60 | HR 100 | Temp 99.1°F | Ht 68.0 in | Wt 171.6 lb

## 2021-10-21 DIAGNOSIS — R5383 Other fatigue: Secondary | ICD-10-CM | POA: Diagnosis not present

## 2021-10-21 DIAGNOSIS — K709 Alcoholic liver disease, unspecified: Secondary | ICD-10-CM | POA: Diagnosis not present

## 2021-10-21 DIAGNOSIS — G8191 Hemiplegia, unspecified affecting right dominant side: Secondary | ICD-10-CM

## 2021-10-21 DIAGNOSIS — E782 Mixed hyperlipidemia: Secondary | ICD-10-CM

## 2021-10-21 DIAGNOSIS — R32 Unspecified urinary incontinence: Secondary | ICD-10-CM | POA: Diagnosis not present

## 2021-10-21 DIAGNOSIS — I639 Cerebral infarction, unspecified: Secondary | ICD-10-CM

## 2021-10-21 DIAGNOSIS — I700 Atherosclerosis of aorta: Secondary | ICD-10-CM

## 2021-10-21 DIAGNOSIS — Z720 Tobacco use: Secondary | ICD-10-CM

## 2021-10-21 DIAGNOSIS — F1020 Alcohol dependence, uncomplicated: Secondary | ICD-10-CM

## 2021-10-21 LAB — POC URINALSYSI DIPSTICK (AUTOMATED)
Bilirubin, UA: NEGATIVE
Blood, UA: NEGATIVE
Glucose, UA: NEGATIVE
Ketones, UA: NEGATIVE
Nitrite, UA: NEGATIVE
Protein, UA: NEGATIVE
Spec Grav, UA: 1.02 (ref 1.010–1.025)
Urobilinogen, UA: 0.2 E.U./dL
pH, UA: 6 (ref 5.0–8.0)

## 2021-10-21 MED ORDER — TRAZODONE HCL 50 MG PO TABS: 25.0000 mg | ORAL_TABLET | Freq: Every evening | ORAL | 5 refills | Status: DC | PRN

## 2021-10-21 MED ORDER — ALBUTEROL SULFATE HFA 108 (90 BASE) MCG/ACT IN AERS: 2.0000 | INHALATION_SPRAY | Freq: Four times a day (QID) | RESPIRATORY_TRACT | 2 refills | Status: DC | PRN

## 2021-10-21 MED ORDER — VITAMIN B-1 100 MG PO TABS: 100.0000 mg | ORAL_TABLET | Freq: Every day | ORAL | 3 refills | Status: DC

## 2021-10-22 LAB — COMPREHENSIVE METABOLIC PANEL
ALT: 16 U/L (ref 0–53)
AST: 25 U/L (ref 0–37)
Albumin: 3.6 g/dL (ref 3.5–5.2)
Alkaline Phosphatase: 71 U/L (ref 39–117)
BUN: 12 mg/dL (ref 6–23)
CO2: 25 mEq/L (ref 19–32)
Calcium: 9.4 mg/dL (ref 8.4–10.5)
Chloride: 103 mEq/L (ref 96–112)
Creatinine, Ser: 0.89 mg/dL (ref 0.40–1.50)
GFR: 83.27 mL/min (ref >60.00)
Glucose, Bld: 112 mg/dL — ABNORMAL HIGH (ref 70–99)
Potassium: 3.8 mEq/L (ref 3.5–5.1)
Sodium: 138 mEq/L (ref 135–145)
Total Bilirubin: 0.5 mg/dL (ref 0.2–1.2)
Total Protein: 7.7 g/dL (ref 6.0–8.3)

## 2021-10-22 LAB — CBC WITH DIFFERENTIAL/PLATELET
Basophils Absolute: 0.1 10*3/uL (ref 0.0–0.1)
Basophils Relative: 0.8 % (ref 0.0–3.0)
Eosinophils Absolute: 0.4 10*3/uL (ref 0.0–0.7)
Eosinophils Relative: 5.2 % — ABNORMAL HIGH (ref 0.0–5.0)
HCT: 40.6 % (ref 39.0–52.0)
Hemoglobin: 13.3 g/dL (ref 13.0–17.0)
Lymphocytes Relative: 29.6 % (ref 12.0–46.0)
Lymphs Abs: 2.4 10*3/uL (ref 0.7–4.0)
MCHC: 32.9 g/dL (ref 30.0–36.0)
MCV: 97.9 fl (ref 78.0–100.0)
Monocytes Absolute: 1 10*3/uL (ref 0.1–1.0)
Monocytes Relative: 12 % (ref 3.0–12.0)
Neutro Abs: 4.3 10*3/uL (ref 1.4–7.7)
Neutrophils Relative %: 52.4 % (ref 43.0–77.0)
Platelets: 183 10*3/uL (ref 150.0–400.0)
RBC: 4.14 Mil/uL — ABNORMAL LOW (ref 4.22–5.81)
RDW: 12.8 % (ref 11.5–15.5)
WBC: 8.1 10*3/uL (ref 4.0–10.5)

## 2021-10-22 LAB — URINE CULTURE
MICRO NUMBER:: 13578096
Result:: NO GROWTH
SPECIMEN QUALITY:: ADEQUATE

## 2021-10-22 LAB — TSH: TSH: 1.07 u[IU]/mL (ref 0.35–5.50)

## 2021-11-09 ENCOUNTER — Ambulatory Visit (HOSPITAL_COMMUNITY): Payer: 59

## 2021-11-12 ENCOUNTER — Telehealth: Payer: Self-pay | Admitting: Family Medicine

## 2021-11-12 NOTE — Telephone Encounter (Signed)
Left message for patient to call back and schedule Medicare Annual Wellness Visit (AWV).   Please offer to do virtually or by telephone.   Last AWV:04/12/2020  Please schedule at anytime with [facility]  30 minute appointent  If any questions, please contact me at [phone]

## 2021-11-19 ENCOUNTER — Other Ambulatory Visit: Payer: Self-pay | Admitting: Family Medicine

## 2021-11-20 ENCOUNTER — Ambulatory Visit: Payer: 59 | Admitting: Neurology

## 2021-11-20 ENCOUNTER — Encounter: Payer: Self-pay | Admitting: Neurology

## 2021-11-20 NOTE — Progress Notes (Deleted)
Patient: William Gibson Date of Birth: [birth year]  Reason for Visit: Follow up History from: Patient Primary Neurologist:    ASSESSMENT AND PLAN 76 y.o. year old male   52.  Acute left pontine stroke in December 2022 2.  History of traumatic brain injury 3.  Cognitive impairment   HISTORY  William Gibson is a 76 year old male,, seen in request by his primary care physician Dr. Yong Channel, Annie Main for evaluation of memory loss, he is accompanied by his daughter at today's visit on May 16, 2021, the other daughter is connected through [place],   I reviewed and summarized the referring note. PMHX. HLD Alcohol abuse Rheumatoid arthritis Smoke 1 ppd x60 years   Around Christmas time, he was noted to have sudden onset slurred speech, right arm and leg weakness, leading to MRI of the brain ordered by his primary care physician, I personally reviewed the film, positive DWI lesion at left parasagittal pons, periventricular moderate small vessel disease, right cerebellar chronic infarction.  In addition bifrontal right anterior temporal encephalomalacia, there is no evidence of large vessel disease.  Patient does report a history of traumatic brain injury in July 2021, fell backwards, landed on occipital region, with prolonged loss of consciousness,   He was not on antiplatelet agent, not taking aspirin 325 mg daily, cardiology appointment pending  Over the past few weeks, his slurred speech improved to baseline, improvement of right arm and leg weakness.  He also had a history of rheumatoid arthritis, multiple joints pain, under the care of rheumatologist, was previously treated with daily meloxicam, now stopped.  He was noted to have mild memory loss, MoCA 22/30 today, missed 5 out of 5 recall,  He used to drink a gallon of hard liquor a week, now he only drinks mild amount mixed with soft drinks, still smokes three-quarter pack a day  MRI brain, MRA neck and head in Jan  2023. Suspect subacute infarct of the parasagittal left pons.  Moderate chronic microvascular ischemic changes. Bifrontal and anterior temporal encephalomalacia. Chronic right cerebellar infarct.  No large vessel occlusion or hemodynamically significant stenosis.  Update 11/20/2021 SS:   REVIEW OF SYSTEMS: Out of a complete 14 system review of symptoms, the patient complains only of the following symptoms, and all other reviewed systems are negative.  See HPI  ALLERGIES: Allergies  Allergen Reactions   Adalimumab Rash    HOME MEDICATIONS: Outpatient Medications Prior to Visit  Medication Sig Dispense Refill   albuterol (VENTOLIN HFA) 108 (90 Base) MCG/ACT inhaler Inhale 2 puffs into the lungs every 6 (six) hours as needed for wheezing or shortness of breath. 1 each 2   aspirin EC 325 MG tablet Take 1 tablet (325 mg total) by mouth daily. 30 tablet 0   CIMZIA PREFILLED 2 X 200 MG/ML KIT Inject into the skin.     Cyanocobalamin (VITAMIN B-12 PO) Take 1 tablet by mouth daily. (Patient not taking: Reported on 05/16/2021)     Multiple Vitamin (MULTIVITAMIN PO) Take by mouth.     omeprazole (PRILOSEC) 20 MG capsule Take 1 capsule (20 mg total) by mouth daily. 90 capsule 3   rosuvastatin (CRESTOR) 5 MG tablet Take 1 tablet (5 mg total) by mouth daily. 90 tablet 3   thiamine (VITAMIN B-1) 100 MG tablet Take 1 tablet (100 mg total) by mouth daily. 90 tablet 3   traZODone (DESYREL) 50 MG tablet TAKE 0.5-1 TABLETS BY MOUTH AT BEDTIME AS NEEDED FOR SLEEP. 90 tablet 2  No facility-administered medications prior to visit.    PAST MEDICAL HISTORY: Past Medical History:  Diagnosis Date   Abnormal liver function tests 1/61/0960   Alcoholic liver disease ([place]) 12/15/2018   Alcoholism ([place])    Elevated LDL cholesterol level 05/22/2013   Epistaxis    Erectile dysfunction 05/22/2013   Frequent falls 11/09/2018   GERD (gastroesophageal reflux disease)    Hyperammonemia ([place]) 11/15/2018   Memory  change 08/17/2017   RA (rheumatoid arthritis) ([place]) 08/17/2017   RBBB 12/15/2018   Rheumatoid arthritis ([place]) 2010   Shuffling gait 12/19/2018   Tobacco abuse 04/26/2012   Weight loss, unintentional 11/09/2018   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 12/15/2018    PAST SURGICAL HISTORY: Past Surgical History:  Procedure Laterality Date   NASAL ENDOSCOPY WITH EPISTAXIS CONTROL N/A 04/12/2018   Procedure: NASAL ENDOSCOPY WITH EPISTAXIS CONTROL;  Surgeon: Melissa Montane, MD;  Location: [facility];  Service: ENT;  Laterality: N/A;   VASECTOMY     VASECTOMY REVERSAL      FAMILY HISTORY: Family History  Problem Relation Age of Onset   Heart disease Mother    Lung cancer Father    Heart attack Father    CAD Brother    Aneurysm Brother    Colon cancer Neg Hx    Esophageal cancer Neg Hx    Stomach cancer Neg Hx     SOCIAL HISTORY: Social History   Socioeconomic History   Marital status: Married    Spouse name: Not on file   Number of children: Not on file   Years of education: Not on file   Highest education level: Not on file  Occupational History   Occupation: retired  Tobacco Use   Smoking status: Every Day    Packs/day: 1.50    Years: 58.00    Total pack years: 87.00    Types: Cigarettes   Smokeless tobacco: Never   Tobacco comments:    Pt states he smokes 3/4 of a  ppd   Vaping Use   Vaping Use: Never used  Substance and Sexual Activity   Alcohol use: Not Currently    Comment: 1/5 whiskey a day prior; none in last two weeks as of 05/16/21   Drug use: No   Sexual activity: Not Currently  Other Topics Concern   Not on file  Social History Narrative   Married. 3 children. 8 grandkids. 1 greatgrandkid.       Retired from being self employed Psychologist, counselling         Does not have a living will.   Desires CPR, does not want prolonged life support if futile.         05/16/2021   Caffeine: "a little bit", some days none, sometimes 1 cup   Social Determinants of  Health   Financial Resource Strain: Low Risk  (04/12/2020)   Overall Financial Resource Strain (CARDIA)    Difficulty of Paying Living Expenses: Not hard at all  Food Insecurity: No Food Insecurity (04/12/2020)   Hunger Vital Sign    Worried About Running Out of Food in the Last Year: Never true    Ran Out of Food in the Last Year: Never true  Transportation Needs: No Transportation Needs (04/12/2020)   PRAPARE - Hydrologist (Medical): No    Lack of Transportation (Non-Medical): No  Physical Activity: Inactive (04/12/2020)   Exercise Vital Sign    Days of Exercise per Week: 0 days  Minutes of Exercise per Session: 0 min  Stress: No Stress Concern Present (04/12/2020)   [place]    Feeling of Stress : Not at all  Social Connections: Moderately Isolated (04/12/2020)   Social Connection and Isolation Panel [NHANES]    Frequency of Communication with Friends and Family: Twice a week    Frequency of Social Gatherings with Friends and Family: Twice a week    Attends Religious Services: Never    Marine scientist or Organizations: No    Attends Archivist Meetings: Never    Marital Status: Married  Human resources officer Violence: Not At Risk (04/12/2020)   Humiliation, Afraid, Rape, and Kick questionnaire    Fear of Current or Ex-Partner: No    Emotionally Abused: No    Physically Abused: No    Sexually Abused: No    PHYSICAL EXAM  There were no vitals filed for this visit. There is no height or weight on file to calculate BMI.  Generalized: Well developed, in no acute distress  Neurological examination  Mentation: Alert oriented to time, place, history taking. Follows all commands speech and language fluent Cranial nerve II-XII: Pupils were equal round reactive to light. Extraocular movements were full, visual field were full on confrontational test. Facial sensation and  strength were normal. Uvula tongue midline. Head turning and shoulder shrug  were normal and symmetric. Motor: The motor testing reveals 5 over 5 strength of all 4 extremities. Good symmetric motor tone is noted throughout.  Sensory: Sensory testing is intact to soft touch on all 4 extremities. No evidence of extinction is noted.  Coordination: Cerebellar testing reveals good finger-nose-finger and heel-to-shin bilaterally.  Gait and station: Gait is normal. Tandem gait is normal. Romberg is negative. No drift is seen.  Reflexes: Deep tendon reflexes are symmetric and normal bilaterally.   DIAGNOSTIC DATA (LABS, IMAGING, TESTING) - I reviewed patient records, labs, notes, testing and imaging myself where available.  Lab Results  Component Value Date   WBC 8.1 10/21/2021   HGB 13.3 10/21/2021   HCT 40.6 10/21/2021   MCV 97.9 10/21/2021   PLT 183.0 10/21/2021      Component Value Date/Time   NA 138 10/21/2021 1520   NA 139 04/17/2015 1022   K 3.8 10/21/2021 1520   CL 103 10/21/2021 1520   CO2 25 10/21/2021 1520   GLUCOSE 112 (H) 10/21/2021 1520   BUN 12 10/21/2021 1520   BUN 13 04/17/2015 1022   CREATININE 0.89 10/21/2021 1520   CREATININE 0.86 04/12/2020 1638   CALCIUM 9.4 10/21/2021 1520   PROT 7.7 10/21/2021 1520   PROT 7.0 04/17/2015 1022   ALBUMIN 3.6 10/21/2021 1520   ALBUMIN 3.7 04/17/2015 1022   AST 25 10/21/2021 1520   ALT 16 10/21/2021 1520   ALKPHOS 71 10/21/2021 1520   BILITOT 0.5 10/21/2021 1520   BILITOT 0.6 04/17/2015 1022   GFRNONAA 85 04/12/2020 1638   GFRAA 99 04/12/2020 1638   Lab Results  Component Value Date   CHOL 162 12/04/2020   HDL 78.00 12/04/2020   LDLCALC 67 12/04/2020   LDLDIRECT 152.0 04/29/2021   TRIG 83.0 12/04/2020   CHOLHDL 2 12/04/2020   Lab Results  Component Value Date   HGBA1C 6.1 04/29/2021   Lab Results  Component Value Date   VITAMINB12 1,185 (H) 11/07/2019   Lab Results  Component Value Date   TSH 1.07 10/21/2021     Butler Denmark, AGNP-C, DNP  11/20/2021, 5:58 AM [facility] [address], [place], [place] [address] [phone]

## 2022-01-19 ENCOUNTER — Encounter: Payer: Self-pay | Admitting: *Deleted

## 2022-01-20 ENCOUNTER — Ambulatory Visit: Payer: 59 | Admitting: Family Medicine

## 2022-02-09 ENCOUNTER — Telehealth: Payer: Self-pay | Admitting: Family Medicine

## 2022-02-09 NOTE — Telephone Encounter (Signed)
Spoke with patient spouse she state he WCB to sched AWV

## 2022-02-11 ENCOUNTER — Encounter: Payer: Self-pay | Admitting: *Deleted

## 2022-02-16 ENCOUNTER — Other Ambulatory Visit: Payer: Self-pay | Admitting: Family Medicine

## 2022-02-26 ENCOUNTER — Telehealth: Payer: Self-pay | Admitting: Family Medicine

## 2022-02-26 NOTE — Telephone Encounter (Signed)
Copied from [place] [id number]. Topic: Medicare AWV >> Feb 26, 2022  1:57 PM Devoria Glassing wrote: Reason for CRM: Left message for patient to schedule Annual Wellness Visit.  Please schedule with Nurse Health Advisor Charlott Rakes, RN at [facility]. This appt can be telephone or office visit. Please call [phone] ask for [facility]

## 2022-02-27 ENCOUNTER — Telehealth: Payer: Self-pay | Admitting: Family Medicine

## 2022-02-27 NOTE — Telephone Encounter (Signed)
Copied from [place] [phone]. Topic: Medicare AWV >> Feb 26, 2022  1:57 PM Devoria Glassing wrote: Reason for CRM: Left message for patient to schedule Annual Wellness Visit.  Please schedule with Nurse Health Advisor Charlott Rakes, RN at [facility]. This appt can be telephone or office visit. Please call [phone] ask for [facility]

## 2022-03-02 ENCOUNTER — Telehealth: Payer: Self-pay | Admitting: Family Medicine

## 2022-03-02 NOTE — Telephone Encounter (Signed)
I received triage note around 5 pm- just now seeing message- hopeful patient sought care as per message delivered- if not we will see him tomorrow but ED still most ideal with reported symptoms

## 2022-03-02 NOTE — Telephone Encounter (Signed)
Patient's wife [name] states Patient has been blacking out and falling down the last couple of weeks but does not remember falling.  States Patient is coughing that sometimes causes Patient to choke, having a hard time catching his breath and has been incontinent for approx. 1 month.  Transferred to Triage [facility]).

## 2022-03-02 NOTE — Telephone Encounter (Signed)
FYI

## 2022-03-02 NOTE — Telephone Encounter (Signed)
Dr Donata Clay recommends patient to go to ED as he is still passing out and falling/ worsening - however he is willing to see him at an available same day spot.   Detailed vm was left today 03/02/22 advising above. Patient will be reached tomorrow am if no answer during after hours.

## 2022-03-02 NOTE — Telephone Encounter (Signed)
Patient's wife [name] states Patient refuses to go to ER. [name] states Patient will only listen to Dr. Yong Channel and requests Dr. Yong Channel call patient to tell him to go to ER.  [name] requests Dr. Yong Channel fill out FMLA paper work for [name] because she has missed work taking care of Patient.

## 2022-03-02 NOTE — Telephone Encounter (Signed)
Patient Name: William Gibson Gender: Male DOB: August 18, 1945 Age: 75 Y 9 M 29 D Return Phone Number: [phone] (Primary), [phone] (Secondary) Address: City/ State/ Zip: [place]  [address] [facility] Provider Garret Reddish- MD Contact Type Call Who Is Calling Patient / Member / Family / Caregiver Call Type Triage / Clinical Caller Name [name] Relationship To Patient Spouse Return Phone Number [phone] (Primary) Chief Complaint FAINTING or [place] Reason for Call Symptomatic / Request for Health Information Initial Comment Pt has been blacking out and falling also coughing and choking. Pt does not remember falling down also has been incontinent. Translation No Nurse Assessment Nurse: Patsey Berthold, RN, Lattie Haw Date/Time Eilene Ghazi Time): 03/02/2022 4:11:58 PM Confirm and document reason for call. If symptomatic, describe symptoms. ---Caller states blacking out and falling also coughing and choking. Pt does not remember falling down also has been incontinent. Callers wife states her husband fainted on Thursday/Friday night. Says he seems to be getting weaker. Does the patient have any new or worsening symptoms? ---Yes Will a triage be completed? ---Yes Related visit to physician within the last 2 weeks? ---No Does the PT have any chronic conditions? (i.e. diabetes, asthma, this includes High risk factors for pregnancy, etc.) ---Unknown Is this a behavioral health or substance abuse call? ---No Guidelines Guideline Title Affirmed Question Affirmed Notes Nurse Date/Time (Eastern Time) Fainting [1] Age > 50 years AND [2] now alert and feels fine Rubie Maid 03/02/2022 4:20:52 PM  Disp. Time Eilene Ghazi Time) Disposition Final User 03/02/2022 4:06:30 PM Send to Urgent Rose Fillers 03/02/2022 4:43:25 PM Go to ED Now (or PCP triage) Yes Patsey Berthold, RN,  Lattie Haw Final Disposition 03/02/2022 4:43:25 PM Go to ED Now (or PCP triage) Yes Patsey Berthold, RN, Leland Johns Disagree/Comply Disagree Caller Understands Yes PreDisposition Call Doctor Care Advice Given Per Guideline GO TO ED NOW (OR PCP TRIAGE): ANOTHER ADULT SHOULD DRIVE: * It is better and safer if another adult drives instead of you. CALL BACK IF: * You become worse CARE ADVICE given per Fainting (Adult) guideline. Comments User: Fredia Beets, RN Date/Time Eilene Ghazi Time): 03/02/2022 4:44:42 PM Husband refuses to go to ED, wife wanted appt with Dr. Velora Heckler. I warm transferred to office. Referrals GO TO FACILITY REFUSED

## 2022-03-05 ENCOUNTER — Encounter: Payer: Self-pay | Admitting: Family Medicine

## 2022-03-05 ENCOUNTER — Ambulatory Visit (HOSPITAL_COMMUNITY)
Admission: RE | Admit: 2022-03-05 | Discharge: 2022-03-05 | Disposition: A | Discharge status: Home / Self Care | Payer: 59 | Source: Ambulatory Visit | Attending: Family Medicine | Admitting: Family Medicine

## 2022-03-05 ENCOUNTER — Ambulatory Visit (INDEPENDENT_AMBULATORY_CARE_PROVIDER_SITE_OTHER): Payer: 59 | Admitting: Family Medicine

## 2022-03-05 VITALS — BP 130/80 | HR 88 | Temp 97.9°F | Ht 68.0 in | Wt 163.0 lb

## 2022-03-05 DIAGNOSIS — I700 Atherosclerosis of aorta: Secondary | ICD-10-CM

## 2022-03-05 DIAGNOSIS — W19XXXA Unspecified fall, initial encounter: Secondary | ICD-10-CM | POA: Diagnosis not present

## 2022-03-05 DIAGNOSIS — R55 Syncope and collapse: Secondary | ICD-10-CM | POA: Diagnosis not present

## 2022-03-05 DIAGNOSIS — I6782 Cerebral ischemia: Secondary | ICD-10-CM | POA: Diagnosis not present

## 2022-03-05 DIAGNOSIS — F1021 Alcohol dependence, in remission: Secondary | ICD-10-CM | POA: Diagnosis not present

## 2022-03-05 DIAGNOSIS — R058 Other specified cough: Secondary | ICD-10-CM

## 2022-03-05 DIAGNOSIS — E782 Mixed hyperlipidemia: Secondary | ICD-10-CM | POA: Diagnosis not present

## 2022-03-05 DIAGNOSIS — R739 Hyperglycemia, unspecified: Secondary | ICD-10-CM | POA: Diagnosis not present

## 2022-03-05 DIAGNOSIS — S0990XA Unspecified injury of head, initial encounter: Secondary | ICD-10-CM | POA: Diagnosis not present

## 2022-03-05 DIAGNOSIS — I639 Cerebral infarction, unspecified: Secondary | ICD-10-CM

## 2022-03-05 DIAGNOSIS — R519 Headache, unspecified: Secondary | ICD-10-CM | POA: Diagnosis not present

## 2022-03-05 LAB — COMPREHENSIVE METABOLIC PANEL
ALT: 16 U/L (ref 0–53)
AST: 26 U/L (ref 0–37)
Albumin: 4 g/dL (ref 3.5–5.2)
Alkaline Phosphatase: 78 U/L (ref 39–117)
BUN: 13 mg/dL (ref 6–23)
CO2: 28 mEq/L (ref 19–32)
Calcium: 9.4 mg/dL (ref 8.4–10.5)
Chloride: 103 mEq/L (ref 96–112)
Creatinine, Ser: 0.94 mg/dL (ref 0.40–1.50)
GFR: 78.56 mL/min (ref >60.00)
Glucose, Bld: 82 mg/dL (ref 70–99)
Potassium: 4.5 mEq/L (ref 3.5–5.1)
Sodium: 138 mEq/L (ref 135–145)
Total Bilirubin: 0.4 mg/dL (ref 0.2–1.2)
Total Protein: 8.2 g/dL (ref 6.0–8.3)

## 2022-03-05 LAB — CBC WITH DIFFERENTIAL/PLATELET
Basophils Absolute: 0.1 10*3/uL (ref 0.0–0.1)
Basophils Relative: 1.2 % (ref 0.0–3.0)
Eosinophils Absolute: 0.5 10*3/uL (ref 0.0–0.7)
Eosinophils Relative: 5.9 % — ABNORMAL HIGH (ref 0.0–5.0)
HCT: 42 % (ref 39.0–52.0)
Hemoglobin: 13.9 g/dL (ref 13.0–17.0)
Lymphocytes Relative: 33.8 % (ref 12.0–46.0)
Lymphs Abs: 2.6 10*3/uL (ref 0.7–4.0)
MCHC: 33.2 g/dL (ref 30.0–36.0)
MCV: 97.5 fl (ref 78.0–100.0)
Monocytes Absolute: 0.9 10*3/uL (ref 0.1–1.0)
Monocytes Relative: 11.2 % (ref 3.0–12.0)
Neutro Abs: 3.7 10*3/uL (ref 1.4–7.7)
Neutrophils Relative %: 47.9 % (ref 43.0–77.0)
Platelets: 241 10*3/uL (ref 150.0–400.0)
RBC: 4.31 Mil/uL (ref 4.22–5.81)
RDW: 13.1 % (ref 11.5–15.5)
WBC: 7.7 10*3/uL (ref 4.0–10.5)

## 2022-03-05 LAB — LIPID PANEL
Cholesterol: 180 mg/dL (ref 0–200)
HDL: 78 mg/dL (ref >39.00)
LDL Cholesterol: 79 mg/dL (ref 0–99)
NonHDL: 102.18
Total CHOL/HDL Ratio: 2
Triglycerides: 115 mg/dL (ref 0.0–149.0)
VLDL: 23 mg/dL (ref 0.0–40.0)

## 2022-03-05 LAB — HEMOGLOBIN A1C: Hgb A1c MFr Bld: 6 % (ref 4.6–6.5)

## 2022-03-05 MED ORDER — ALBUTEROL SULFATE HFA 108 (90 BASE) MCG/ACT IN AERS: 2.0000 | INHALATION_SPRAY | Freq: Four times a day (QID) | RESPIRATORY_TRACT | 2 refills | Status: DC | PRN

## 2022-03-05 MED ORDER — VITAMIN B-1 100 MG PO TABS: 100.0000 mg | ORAL_TABLET | Freq: Every day | ORAL | 3 refills | Status: DC

## 2022-03-05 MED ORDER — VITAMIN B-12 1000 MCG PO TABS: 1000.0000 ug | ORAL_TABLET | Freq: Every day | ORAL | 3 refills | Status: DC

## 2022-03-05 NOTE — Patient Instructions (Addendum)
Restart b12 and thiamine over the counter  Refilled albuterol- trial this plus lets refer you to lung doctor  We will call you within two weeks about your referral to  pulmonary. If you do not hear within 2 weeks, give [place] a call.   Stat head ct- team please give instructions on this -if head ct ok restart aspirin -if you get lightheaded- sit on the ground and lay down as quickly as you can - to avoid passing out  Please stop by lab before you go If you have mychart- we will send your results within 3 business days of [place] receiving them.  If you do not have mychart- we will call you about results within 5 business days of [place] receiving them.  *please also note that you will see labs on mychart as soon as they post. I will later go in and write notes on them- will say "notes from Dr. Yong Channel"   Quit smoking and drinking! 2 of best things you can do for your health   Recommended follow up: Return in about 1 month (around 04/04/2022) for followup or sooner if needed.Schedule b4 you leave. But ideally see pulmonology first

## 2022-03-05 NOTE — Progress Notes (Signed)
Phone [phone] In person visit   Subjective:   William Gibson is a 76 y.o. year old very pleasant male patient who presents for/with See problem oriented charting Chief Complaint  Patient presents with   passing out    Pt states he fell last week and hit his head upon falling and doesn't remember it.   Cough    Coughs all day long which makes it difficult to breath which caused the passing out.   Choking    Pt c/o gagging with coughing and choking when eating.   incontinence    Pt c/o being incontinent of urine and stool that comes and goes that started a year ago.   medications    Pt is not taking medication as he should.   Past Medical History-  Patient Active Problem List   Diagnosis Date Noted   Right hemiparesis ([place]) 10/21/2021    Priority: High   History of stroke 05/16/2021    Priority: High   Emphysema lung ([place]) 06/03/2020    Priority: High   History of subarachnoid hemorrhage 11/04/2019    Priority: High   Head injury 11/04/2019    Priority: High   Alcoholic liver disease ([place]) 12/15/2018    Priority: High   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 12/15/2018    Priority: High   RA (rheumatoid arthritis) ([place]) 08/17/2017    Priority: High   Alcohol dependence in early full remission ([place]) 12/15/2016    Priority: High   Tobacco abuse 04/26/2012    Priority: High   Aortic atherosclerosis ([place]) 06/03/2020    Priority: Medium    Major depression in full remission ([place]) 01/12/2020    Priority: Medium    Insomnia 01/12/2020    Priority: Medium    Vascular headache     Priority: Medium    Hyperlipidemia 02/06/2019    Priority: Medium    Shuffling gait 12/19/2018    Priority: Medium    Frequent falls 11/09/2018    Priority: Medium    Memory change 08/17/2017    Priority: Medium    GERD (gastroesophageal reflux disease) 05/23/2014    Priority: Medium    Plantar fasciitis 05/20/2021    Priority: Low   History of pancreatitis 01/12/2020     Priority: Low   Chronic post-traumatic headache 12/13/2019    Priority: Low   Hyponatremia     Priority: Low   RBBB 12/15/2018    Priority: Low   Hyperammonemia ([place]) 11/15/2018    Priority: Low   Osteoarthritis 01/30/2016    Priority: Low   Erectile dysfunction 05/22/2013    Priority: Low    Medications- reviewed and updated Current Outpatient Medications  Medication Sig Dispense Refill   aspirin EC 325 MG tablet Take 1 tablet (325 mg total) by mouth daily. 30 tablet 0   CIMZIA PREFILLED 2 X 200 MG/ML KIT Inject into the skin.     omeprazole (PRILOSEC) 20 MG capsule Take 1 capsule (20 mg total) by mouth daily. 90 capsule 3   rosuvastatin (CRESTOR) 5 MG tablet TAKE 1 TABLET BY MOUTH  DAILY 90 tablet 3   albuterol (VENTOLIN HFA) 108 (90 Base) MCG/ACT inhaler Inhale 2 puffs into the lungs every 6 (six) hours as needed for wheezing or shortness of breath. 1 each 2   cyanocobalamin (VITAMIN B12) 1000 MCG tablet Take 1 tablet (1,000 mcg total) by mouth daily. 90 tablet 3   thiamine (VITAMIN B-1) 100 MG tablet Take 1 tablet (100 mg  total) by mouth daily. 90 tablet 3   No current facility-administered medications for this visit.     Objective:  BP 130/80   Pulse 88   Temp 97.9 F (36.6 C)   Ht _0  (1.727 m)   Wt 163 lb (73.9 kg)   SpO2 98%   BMI 24.78 kg/m  Gen: NAD, resting comfortably CV: RRR no murmurs rubs or gallops Lungs: CTAB no crackles, wheeze, rhonchi Ext: no edema Skin: warm, dry Neuro: CN II-XII intact, sensation and reflexes normal throughout, 5/5 muscle strength in left upper and lower extremities, 5-/5 on the right. Normal finger to nose. Normal rapid alternating movements. No pronator drift. Normal romberg. Normal gait.     EKG: sinus rhythm with rate 88 with right bundle branch block, right axis, prolonged qrs- otherwise normal intervals, left atrial enlargement otherwise - I do not agree with RVH in light of RBBB, no st or t wave changes . No significant  changes compared to 04/29/21 EKG.     Assessment and Plan   # Syncope S: Patient states he passed out last week - went to garbage can and when he walked away he started coughing up phlegm- held onto the sink coughing- next thing he knew he was on the floor. Wife heard a thud- his neck and head hurt afterwards so we presume he hit is head. Was having posterior headache. Still having neck soreness but headaches largely resolved.   Other than above syncope related fall- no recent falls -Patient was having falls at last visit and I recommended firmly full cessation of alcohol and using walker at all times. 2 alcoholic beverages per day.  - She was off of B12 and thiamine and I recommended restarting meds-he started but is not on now- encouraged restart -is using walker almost all the time -We discussed prior stroke likely contributes to fall risk due to right hemiparesis - She was having poor sleep at last visit and recommended trial of trazodone-not clear if he took this- currently not taking anything.  -drags feet some per wife- right foot  No facial or new extremity weakness. No slurred words or trouble swallowing. no blurry vision or double vision. No new paresthesias- can get some in certain positions in bed. No confusion or word finding difficulties.    A/P: This certainly appears to be cough related syncope - With poor control of likely underlying COPD (emphysema noted on prior CT) patient is agreeable to trial albuterol again-refill provided and also agreeable to pulmonology referral - EKG reassuring/stable from last visit-doubt cardiac-no chest pain reported  -Reassuring neurological exam and no clear symptoms of stroke -Since it sounds like he hit his head we will get stat head CT today-thankfully this was reassuring after visit  Thankfully outside of syncopal episode no recent falls but encouraged full cessation of alcohol instead of 2/day-would still consider alcohol dependence in  remission given under 27/NTZG but with alcoholic liver disease as noted needs full cessation  #Chronic cough/suspected COPD/current smoker S: Coughing fits-patient coughs majority of the day and spells-can make him feel short of breath-feels it could have contributed to passing out episode -Last visit recommended smoking cessation and offered albuterol and inhaler- he states has used a few times and it helped- then he thought broke but daughter reports he is out- we wil refill -Last visit declined pulmonology consult -still smoking 1 ppd- encouraged full cessation -Note from wife reports some choking episodes when eating but he reports this is all  simply related to chronic cough-he denies any choking with eating outside of his normal cough A/P: Strongly recommended cessation from smoking.  Suspect underlying COPD (incidental emphysema noted on CT)-refer to pulmonology.  Refill albuterol  #Urinary incontinence as well as fecal incontinence S: Reports issues for about a year that come and go.  We ordered urinalysis and urine culture last visit that were reassuring A/P: Overall stable-with falls and incontinence consider something like normal pressure hydrocephalus but I think overall this is low risk-no obvious evidence of this on CT today but does not fully rule out  #Hyperlipidemia/aortic atherosclerosis S: CAD on lung cancer screening x 2 vessels.  -He is to be on rosuvastatin 5 mg daily - reports taking - He should be on aspirin 325 mg daily or at least 81 mg- is not taking- will restart if ct ok with no bleeding Lab Results  Component Value Date   CHOL 180 03/05/2022   HDL 78.00 03/05/2022   LDLCALC 79 03/05/2022   LDLDIRECT 152.0 04/29/2021   TRIG 115.0 03/05/2022   CHOLHDL 2 03/05/2022  A/P: Lipids mildly poorly controlled with history of stroke prefer LDL under 70-team will reach out to see if he is as consistent as reported at visit-if he is will increase to 10 mg daily - With  aortic atherosclerosis and history of stroke prefer LDL under 70-slightly above goal-adjust as above  Recommended follow up: Return in about 1 month (around 04/04/2022) for followup or sooner if needed.Schedule b4 you leave. Future Appointments  Date Time Provider [place]  04/14/2022 11:00 AM Marin Olp, MD [facility]  07/14/2022  1:20 PM Marin Olp, MD [facility]    Lab/Order associations:   ICD-10-CM   1. Syncope, unspecified syncope type  R55 EKG 12-Lead    2. Alcohol dependence in early full remission ([place])  F10.21     3. Aortic atherosclerosis ([facility])  I70.0     4. Mixed hyperlipidemia  E78.2 CBC with Differential/Platelet    Comprehensive metabolic panel    Lipid panel    5. Recurrent cough  R05.8 Ambulatory referral to Pulmonology    6. Fall, initial encounter  W19.XXXA CT HEAD WO CONTRAST (5MM)    7. Traumatic injury of head, initial encounter  S09.90XA CT HEAD WO CONTRAST (5MM)    8. Hyperglycemia  R73.9 Hemoglobin A1c      Meds ordered this encounter  Medications   thiamine (VITAMIN B-1) 100 MG tablet    Sig: Take 1 tablet (100 mg total) by mouth daily.    Dispense:  90 tablet    Refill:  3   cyanocobalamin (VITAMIN B12) 1000 MCG tablet    Sig: Take 1 tablet (1,000 mcg total) by mouth daily.    Dispense:  90 tablet    Refill:  3   albuterol (VENTOLIN HFA) 108 (90 Base) MCG/ACT inhaler    Sig: Inhale 2 puffs into the lungs every 6 (six) hours as needed for wheezing or shortness of breath.    Dispense:  1 each    Refill:  2    Return precautions advised.  Garret Reddish, MD

## 2022-03-06 ENCOUNTER — Encounter: Payer: 59 | Admitting: Family Medicine

## 2022-03-12 ENCOUNTER — Other Ambulatory Visit: Payer: Self-pay

## 2022-03-12 MED ORDER — ROSUVASTATIN CALCIUM 10 MG PO TABS: 10.0000 mg | ORAL_TABLET | Freq: Every day | ORAL | 3 refills | Status: DC

## 2022-04-07 ENCOUNTER — Institutional Professional Consult (permissible substitution): Payer: 59 | Admitting: Internal Medicine

## 2022-04-14 ENCOUNTER — Ambulatory Visit: Payer: 59 | Admitting: Family Medicine

## 2022-04-17 DIAGNOSIS — Z6824 Body mass index (BMI) 24.0-24.9, adult: Secondary | ICD-10-CM | POA: Diagnosis not present

## 2022-04-17 DIAGNOSIS — M1991 Primary osteoarthritis, unspecified site: Secondary | ICD-10-CM | POA: Diagnosis not present

## 2022-04-17 DIAGNOSIS — Z79899 Other long term (current) drug therapy: Secondary | ICD-10-CM | POA: Diagnosis not present

## 2022-04-17 DIAGNOSIS — M0579 Rheumatoid arthritis with rheumatoid factor of multiple sites without organ or systems involvement: Secondary | ICD-10-CM | POA: Diagnosis not present

## 2022-04-17 DIAGNOSIS — R5383 Other fatigue: Secondary | ICD-10-CM | POA: Diagnosis not present

## 2022-04-17 DIAGNOSIS — M2550 Pain in unspecified joint: Secondary | ICD-10-CM | POA: Diagnosis not present

## 2022-05-05 ENCOUNTER — Institutional Professional Consult (permissible substitution): Payer: 59 | Admitting: Internal Medicine

## 2022-05-05 NOTE — Progress Notes (Deleted)
   William Gibson, male    DOB: 03/15/1946   MRN: [id number]   Brief patient profile:  ***  *** referred to pulmonary clinic 05/05/2022 by *** for ***        History of Present Illness  05/05/2022  Pulmonary/ 1st office eval/William Gibson  No chief complaint on file.    Dyspnea:  *** Cough: *** Sleep: *** SABA use:   Past Medical History:  Diagnosis Date   Abnormal liver function tests 5/36/1443   Alcoholic liver disease ([place]) 12/15/2018   Alcoholism ([place])    Elevated LDL cholesterol level 05/22/2013   Epistaxis    Erectile dysfunction 05/22/2013   Frequent falls 11/09/2018   GERD (gastroesophageal reflux disease)    Hyperammonemia ([place]) 11/15/2018   Memory change 08/17/2017   RA (rheumatoid arthritis) ([place]) 08/17/2017   RBBB 12/15/2018   Rheumatoid arthritis ([place]) 2010   Shuffling gait 12/19/2018   Tobacco abuse 04/26/2012   Weight loss, unintentional 11/09/2018   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 12/15/2018    Outpatient Medications Prior to Visit  Medication Sig Dispense Refill   albuterol (VENTOLIN HFA) 108 (90 Base) MCG/ACT inhaler Inhale 2 puffs into the lungs every 6 (six) hours as needed for wheezing or shortness of breath. 1 each 2   aspirin EC 325 MG tablet Take 1 tablet (325 mg total) by mouth daily. 30 tablet 0   CIMZIA PREFILLED 2 X 200 MG/ML KIT Inject into the skin.     cyanocobalamin (VITAMIN B12) 1000 MCG tablet Take 1 tablet (1,000 mcg total) by mouth daily. 90 tablet 3   omeprazole (PRILOSEC) 20 MG capsule Take 1 capsule (20 mg total) by mouth daily. 90 capsule 3   rosuvastatin (CRESTOR) 10 MG tablet Take 1 tablet (10 mg total) by mouth daily. 90 tablet 3   thiamine (VITAMIN B-1) 100 MG tablet Take 1 tablet (100 mg total) by mouth daily. 90 tablet 3   No facility-administered medications prior to visit.     Objective:     There were no vitals taken for this visit.         Assessment   No problem-specific Assessment & Plan notes found for this  encounter.     William Gully, MD 05/05/2022

## 2022-05-08 ENCOUNTER — Ambulatory Visit (INDEPENDENT_AMBULATORY_CARE_PROVIDER_SITE_OTHER): Payer: 59

## 2022-05-08 VITALS — Wt 163.0 lb

## 2022-05-08 DIAGNOSIS — Z0000 Encounter for general adult medical examination without abnormal findings: Secondary | ICD-10-CM

## 2022-05-08 DIAGNOSIS — Z122 Encounter for screening for malignant neoplasm of respiratory organs: Secondary | ICD-10-CM | POA: Diagnosis not present

## 2022-05-08 NOTE — Patient Instructions (Signed)
[name] , Thank you for taking time to come for your Medicare Wellness Visit. I appreciate your ongoing commitment to your health goals. Please review the following plan we discussed and let me know if I can assist you in the future.   These are the goals we discussed:  Goals      Patient Stated     None at this time     Patient Stated     None at this time         This is a list of the screening recommended for you and due dates:  Health Maintenance  Topic Date Due   COVID-19 Vaccine (1) Never done   DTaP/Tdap/Td vaccine (1 - Tdap) Never done   Screening for Lung Cancer  05/15/2021   Zoster (Shingles) Vaccine (1 of 2) 06/05/2022*   Flu Shot  07/26/2022*   Pneumonia Vaccine (2 - PCV) 10/22/2022*   Medicare Annual Wellness Visit  05/09/2023   Hepatitis C Screening: USPSTF Recommendation to screen - Ages 18-79 yo.  Completed   HPV Vaccine  Aged Out   Colon Cancer Screening  Discontinued  *Topic was postponed. The date shown is not the original due date.    Advanced directives: .copies in chart   Conditions/risks identified: none at this time   Next appointment: Follow up in one year for your annual wellness visit.   Preventive Care 32 Years and Older, Male  Preventive care refers to lifestyle choices and visits with your health care provider that can promote health and wellness. What does preventive care include? A yearly physical exam. This is also called an annual well check. Dental exams once or twice a year. Routine eye exams. Ask your health care provider how often you should have your eyes checked. Personal lifestyle choices, including: Daily care of your teeth and gums. Regular physical activity. Eating a healthy diet. Avoiding tobacco and drug use. Limiting alcohol use. Practicing safe sex. Taking low doses of aspirin every day. Taking vitamin and mineral supplements as recommended by your health care provider. What happens during an annual well  check? The services and screenings done by your health care provider during your annual well check will depend on your age, overall health, lifestyle risk factors, and family history of disease. Counseling  Your health care provider may ask you questions about your: Alcohol use. Tobacco use. Drug use. Emotional well-being. Home and relationship well-being. Sexual activity. Eating habits. History of falls. Memory and ability to understand (cognition). Work and work Statistician. Screening  You may have the following tests or measurements: Height, weight, and BMI. Blood pressure. Lipid and cholesterol levels. These may be checked every 5 years, or more frequently if you are over 60 years old. Skin check. Lung cancer screening. You may have this screening every year starting at age 56 if you have a 30-pack-year history of smoking and currently smoke or have quit within the past 15 years. Fecal occult blood test (FOBT) of the stool. You may have this test every year starting at age 77. Flexible sigmoidoscopy or colonoscopy. You may have a sigmoidoscopy every 5 years or a colonoscopy every 10 years starting at age 77. Prostate cancer screening. Recommendations will vary depending on your family history and other risks. Hepatitis C blood test. Hepatitis B blood test. Sexually transmitted disease (STD) testing. Diabetes screening. This is done by checking your blood sugar (glucose) after you have not eaten for a while (fasting). You may have this done every 1-3  years. Abdominal aortic aneurysm (AAA) screening. You may need this if you are a current or former smoker. Osteoporosis. You may be screened starting at age 77 if you are at high risk. Talk with your health care provider about your test results, treatment options, and if necessary, the need for more tests. Vaccines  Your health care provider may recommend certain vaccines, such as: Influenza vaccine. This is recommended every  year. Tetanus, diphtheria, and acellular pertussis (Tdap, Td) vaccine. You may need a Td booster every 10 years. Zoster vaccine. You may need this after age 77. Pneumococcal 13-valent conjugate (PCV13) vaccine. One dose is recommended after age 77. Pneumococcal polysaccharide (PPSV23) vaccine. One dose is recommended after age 77. Talk to your health care provider about which screenings and vaccines you need and how often you need them. This information is not intended to replace advice given to you by your health care provider. Make sure you discuss any questions you have with your health care provider. Document Released: 05/10/2015 Document Revised: 01/01/2016 Document Reviewed: 02/12/2015 Elsevier Interactive Patient Education  2017 [place] Prevention in the Home Falls can cause injuries. They can happen to people of all ages. There are many things you can do to make your home safe and to help prevent falls. What can I do on the outside of my home? Regularly fix the edges of walkways and driveways and fix any cracks. Remove anything that might make you trip as you walk through a door, such as a raised step or threshold. Trim any bushes or trees on the path to your home. Use bright outdoor lighting. Clear any walking paths of anything that might make someone trip, such as rocks or tools. Regularly check to see if handrails are loose or broken. Make sure that both sides of any steps have handrails. Any raised decks and porches should have guardrails on the edges. Have any leaves, snow, or ice cleared regularly. Use sand or salt on walking paths during winter. Clean up any spills in your garage right away. This includes oil or grease spills. What can I do in the bathroom? Use night lights. Install grab bars by the toilet and in the tub and shower. Do not use towel bars as grab bars. Use non-skid mats or decals in the tub or shower. If you need to sit down in the shower, use a  plastic, non-slip stool. Keep the floor dry. Clean up any water that spills on the floor as soon as it happens. Remove soap buildup in the tub or shower regularly. Attach bath mats securely with double-sided non-slip rug tape. Do not have throw rugs and other things on the floor that can make you trip. What can I do in the bedroom? Use night lights. Make sure that you have a light by your bed that is easy to reach. Do not use any sheets or blankets that are too big for your bed. They should not hang down onto the floor. Have a firm chair that has side arms. You can use this for support while you get dressed. Do not have throw rugs and other things on the floor that can make you trip. What can I do in the kitchen? Clean up any spills right away. Avoid walking on wet floors. Keep items that you use a lot in easy-to-reach places. If you need to reach something above you, use a strong step stool that has a grab bar. Keep electrical cords out of the way. Do  not use floor polish or wax that makes floors slippery. If you must use wax, use non-skid floor wax. Do not have throw rugs and other things on the floor that can make you trip. What can I do with my stairs? Do not leave any items on the stairs. Make sure that there are handrails on both sides of the stairs and use them. Fix handrails that are broken or loose. Make sure that handrails are as long as the stairways. Check any carpeting to make sure that it is firmly attached to the stairs. Fix any carpet that is loose or worn. Avoid having throw rugs at the top or bottom of the stairs. If you do have throw rugs, attach them to the floor with carpet tape. Make sure that you have a light switch at the top of the stairs and the bottom of the stairs. If you do not have them, ask someone to add them for you. What else can I do to help prevent falls? Wear shoes that: Do not have high heels. Have rubber bottoms. Are comfortable and fit you  well. Are closed at the toe. Do not wear sandals. If you use a stepladder: Make sure that it is fully opened. Do not climb a closed stepladder. Make sure that both sides of the stepladder are locked into place. Ask someone to hold it for you, if possible. Clearly mark and make sure that you can see: Any grab bars or handrails. First and last steps. Where the edge of each step is. Use tools that help you move around (mobility aids) if they are needed. These include: Canes. Walkers. Scooters. Crutches. Turn on the lights when you go into a dark area. Replace any light bulbs as soon as they burn out. Set up your furniture so you have a clear path. Avoid moving your furniture around. If any of your floors are uneven, fix them. If there are any pets around you, be aware of where they are. Review your medicines with your doctor. Some medicines can make you feel dizzy. This can increase your chance of falling. Ask your doctor what other things that you can do to help prevent falls. This information is not intended to replace advice given to you by your health care provider. Make sure you discuss any questions you have with your health care provider. Document Released: 02/07/2009 Document Revised: 09/19/2015 Document Reviewed: 05/18/2014 Elsevier Interactive Patient Education  2017 [employer].

## 2022-05-08 NOTE — Progress Notes (Signed)
I connected with  William Gibson on 05/08/22 by a audio enabled telemedicine application and verified that I am speaking with the correct person using two identifiers.along with William Gibson   Patient Location: Home  Provider Location: Office/Clinic  I discussed the limitations of evaluation and management by telemedicine. The patient expressed understanding and agreed to proceed.   Subjective:   William Gibson is a 77 y.o. male who presents for Medicare Annual/Subsequent preventive examination.  Review of Systems     Cardiac Risk Factors include: advanced age (>38mn, >>59women);male gender;smoking/ tobacco exposure     Objective:    Today's Vitals   05/08/22 1156  Weight: 163 lb (73.9 kg)   Body mass index is 24.78 kg/m.     05/08/2022   12:08 PM 04/12/2020   10:07 AM 12/11/2019    2:52 PM 11/10/2019    4:00 PM 11/09/2019    5:00 PM 11/04/2019    3:09 PM 10/23/2019    3:54 PM  Advanced Directives  Does Patient Have a Medical Advance Directive? Yes Yes Yes Yes Yes Yes Yes  Type of [place] will Healthcare Power of [place] will Healthcare Power of [place] will  Does patient want to make changes to medical advance directive? No - Patient declined   No - Patient declined No - Patient declined    Copy of [place] Chart? Yes - validated most recent copy scanned in chart (See row information) Yes - validated most recent copy scanned in chart (See row information)  No - copy requested No - copy requested No - copy requested     Current Medications (verified) Outpatient Encounter Medications as of 05/08/2022  Medication Sig   albuterol (VENTOLIN HFA) 108 (90 Base) MCG/ACT inhaler Inhale 2 puffs into the lungs every 6 (six) hours as needed for wheezing or shortness of breath.   aspirin  EC 81 MG tablet Take 81 mg by mouth daily. Swallow whole.   CIMZIA PREFILLED 2 X 200 MG/ML KIT Inject into the skin.   cyanocobalamin (VITAMIN B12) 1000 MCG tablet Take 1 tablet (1,000 mcg total) by mouth daily.   omeprazole (PRILOSEC) 20 MG capsule Take 1 capsule (20 mg total) by mouth daily.   oxybutynin (DITROPAN) 5 MG tablet Take 5 mg by mouth 3 (three) times daily.   rosuvastatin (CRESTOR) 10 MG tablet Take 1 tablet (10 mg total) by mouth daily.   sulfaSALAzine (AZULFIDINE) 500 MG EC tablet Take 500 mg by mouth 4 (four) times daily.   thiamine (VITAMIN B-1) 100 MG tablet Take 1 tablet (100 mg total) by mouth daily.   [DISCONTINUED] aspirin EC 325 MG tablet Take 1 tablet (325 mg total) by mouth daily. (Patient taking differently: Take 81 mg by mouth daily.)   No facility-administered encounter medications on file as of 05/08/2022.    Allergies (verified) Adalimumab   History: Past Medical History:  Diagnosis Date   Abnormal liver function tests 82/42/3536  Alcoholic liver disease ([place] 12/15/2018   Alcoholism ([place]    Elevated LDL cholesterol level 05/22/2013   Epistaxis    Erectile dysfunction 05/22/2013   Frequent falls 11/09/2018   GERD (gastroesophageal reflux disease)    Hyperammonemia ([place] 11/15/2018   Memory change 08/17/2017   RA (rheumatoid arthritis) ([facility] 08/17/2017   RBBB 12/15/2018   Rheumatoid arthritis ([place] 2010   Shuffling gait  12/19/2018   Tobacco abuse 04/26/2012   Weight loss, unintentional 11/09/2018   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 12/15/2018   Past Surgical History:  Procedure Laterality Date   NASAL ENDOSCOPY WITH EPISTAXIS CONTROL N/A 04/12/2018   Procedure: NASAL ENDOSCOPY WITH EPISTAXIS CONTROL;  Surgeon: Melissa Montane, MD;  Location: [facility];  Service: ENT;  Laterality: N/A;   VASECTOMY     VASECTOMY REVERSAL     Family History  Problem Relation Age of Onset   Heart disease Mother    Lung cancer Father    Heart attack Father    CAD  Brother    Aneurysm Brother    Colon cancer Neg Hx    Esophageal cancer Neg Hx    Stomach cancer Neg Hx    Social History   Socioeconomic History   Marital status: Married    Spouse name: Not on file   Number of children: Not on file   Years of education: Not on file   Highest education level: Not on file  Occupational History   Occupation: retired  Tobacco Use   Smoking status: Every Day    Packs/day: 1.50    Years: 58.00    Total pack years: 87.00    Types: Cigarettes   Smokeless tobacco: Never   Tobacco comments:    Pt states he smokes 3/4 of a  ppd   Vaping Use   Vaping Use: Never used  Substance and Sexual Activity   Alcohol use: Not Currently    Comment: 1/5 whiskey a day prior; none in last two weeks as of 05/16/21   Drug use: No   Sexual activity: Not Currently  Other Topics Concern   Not on file  Social History Narrative   Married. 3 children. 8 grandkids. 1 greatgrandkid.       Retired from being self employed Psychologist, counselling         Does not have a living will.   Desires CPR, does not want prolonged life support if futile.         05/16/2021   Caffeine: "a little bit", some days none, sometimes 1 cup   Social Determinants of Health   Financial Resource Strain: Low Risk  (05/08/2022)   Overall Financial Resource Strain (CARDIA)    Difficulty of Paying Living Expenses: Not hard at all  Food Insecurity: No Food Insecurity (05/08/2022)   Hunger Vital Sign    Worried About Running Out of Food in the Last Year: Never true    Ran Out of Food in the Last Year: Never true  Transportation Needs: No Transportation Needs (05/08/2022)   PRAPARE - Hydrologist (Medical): No    Lack of Transportation (Non-Medical): No  Physical Activity: Sufficiently Active (05/08/2022)   Exercise Vital Sign    Days of Exercise per Week: 3 days    Minutes of Exercise per Session: 60 min  Stress: No Stress Concern Present (05/08/2022)   [place]    Feeling of Stress : Not at all  Social Connections: Moderately Isolated (05/08/2022)   Social Connection and Isolation Panel [NHANES]    Frequency of Communication with Friends and Family: Twice a week    Frequency of Social Gatherings with Friends and Family: Twice a week    Attends Religious Services: Never    Marine scientist or Organizations: No    Attends Archivist Meetings: Never  Marital Status: Married    Tobacco Counseling Ready to quit: Not Answered Counseling given: Not Answered Tobacco comments: Pt states he smokes 3/4 of a  ppd    Clinical Intake:  Pre-visit preparation completed: Yes  Pain : No/denies pain     BMI - recorded: 24.78 Nutritional Status: BMI of 19-24  Normal Nutritional Risks: None Diabetes: No  How often do you need to have someone help you when you read instructions, pamphlets, or other written materials from your doctor or pharmacy?: 1 - Never  Diabetic?no  Interpreter Needed?: No  Information entered by :: Charlott Rakes, LPN   Activities of Daily Living    05/08/2022   11:57 AM  In your present state of health, do you have any difficulty performing the following activities:  Hearing? 1  Comment mild loss  Vision? 0  Difficulty concentrating or making decisions? 0  Walking or climbing stairs? 0  Dressing or bathing? 0  Doing errands, shopping? 0  Preparing Food and eating ? N  Using the Toilet? N  In the past six months, have you accidently leaked urine? Y  Comment at times  Do you have problems with loss of bowel control? N  Managing your Medications? N  Managing your Finances? N  Housekeeping or managing your Housekeeping? N    Patient Care Team: Marin Olp, MD as PCP - General (Family Medicine) Pyrtle, Lajuan Lines, MD as Consulting Physician (Gastroenterology) Wendie Agreste, MD as Consulting Physician (Family  Medicine) Darleen Crocker, MD as Consulting Physician (Ophthalmology)  Indicate any recent Medical Services you may have received from other than [facility] providers in the past year (date may be approximate).     Assessment:   This is a routine wellness examination for Godric.  Hearing/Vision screen Hearing Screening - Comments:: Mild loss  Vision Screening - Comments:: Encouraged to follow up with provider   Dietary issues and exercise activities discussed: Current Exercise Habits: The patient does not participate in regular exercise at present   Goals Addressed             This Visit's Progress    Patient Stated       None at this time        Depression Screen    05/08/2022   12:06 PM 03/05/2022   10:32 AM 10/21/2021    2:29 PM 04/29/2021    2:39 PM 10/21/2020    3:38 PM 04/12/2020    4:04 PM 04/12/2020   11:12 AM  PHQ 2/9 Scores  PHQ - 2 Score 1 0 0 1 0 0 0  PHQ- 9 Score  0 6 13 0 0     Fall Risk    05/08/2022   12:08 PM 10/21/2021    2:26 PM 10/21/2020    3:38 PM 04/12/2020   11:13 AM 02/16/2020    9:49 AM  Fall Risk   Falls in the past year? 1 1 0 1 0  Number falls in past yr: 1 1 0 1 0  Injury with Fall? 0 0 0 1 0  Comment    head trauma   Risk for fall due to : Impaired vision;History of fall(s);Impaired balance/gait;Impaired mobility History of fall(s) No Fall Risks Impaired vision;History of fall(s)   Follow up Falls prevention discussed Falls evaluation completed;Education provided Falls evaluation completed Falls prevention discussed     FALL RISK PREVENTION PERTAINING TO THE HOME:  Any stairs in or around the home? Yes  If so, are there  any without handrails? No  Home free of loose throw rugs in walkways, pet beds, electrical cords, etc? Yes  Adequate lighting in your home to reduce risk of falls? Yes   ASSISTIVE DEVICES UTILIZED TO PREVENT FALLS:  Life alert? Yes  Use of a cane, walker or w/c? Yes  Grab bars in the bathroom? No  Shower chair or  bench in shower? Yes  Elevated toilet seat or a handicapped toilet? No   TIMED UP AND GO:  Was the test performed? No .   Cognitive Function:    02/08/2019    9:29 AM 08/17/2017    9:55 AM  MMSE - Mini Mental State Exam  Orientation to time 5 5  Orientation to Place 3 4  Registration 3 3  Attention/ Calculation 5 5  Recall 3 3  Language- name 2 objects 2 2  Language- repeat 1 1  Language- follow 3 step command 2 3  Language- read & follow direction 1 1  Write a sentence 1 1  Copy design 1 1  Total score 27 29      05/16/2021   11:00 AM  Montreal Cognitive Assessment   Visuospatial/ Executive (0/5) 4  Naming (0/3) 3  Attention: Read list of digits (0/2) 2  Attention: Read list of letters (0/1) 1  Attention: Serial 7 subtraction starting at 100 (0/3) 3  Language: Repeat phrase (0/2) 2  Language : Fluency (0/1) 0  Abstraction (0/2) 2  Delayed Recall (0/5) 0  Orientation (0/6) 5  Total 22      Immunizations Immunization History  Administered Date(s) Administered   Fluad Quad(high Dose 65+) 04/18/2019, 01/12/2020   Influenza, High Dose Seasonal PF 03/11/2021   Pneumococcal Polysaccharide-23 04/12/2020    TDAP status: Due, Education has been provided regarding the importance of this vaccine. Advised may receive this vaccine at local pharmacy or Health Dept. Aware to provide a copy of the vaccination record if obtained from local pharmacy or Health Dept. Verbalized acceptance and understanding.  Flu Vaccine status: Due, Education has been provided regarding the importance of this vaccine. Advised may receive this vaccine at local pharmacy or Health Dept. Aware to provide a copy of the vaccination record if obtained from local pharmacy or Health Dept. Verbalized acceptance and understanding.  Pneumococcal vaccine status: Due, Education has been provided regarding the importance of this vaccine. Advised may receive this vaccine at local pharmacy or Health Dept. Aware to  provide a copy of the vaccination record if obtained from local pharmacy or Health Dept. Verbalized acceptance and understanding.  Covid-19 vaccine status: Declined, Education has been provided regarding the importance of this vaccine but patient still declined. Advised may receive this vaccine at local pharmacy or Health Dept.or vaccine clinic. Aware to provide a copy of the vaccination record if obtained from local pharmacy or Health Dept. Verbalized acceptance and understanding.  Qualifies for Shingles Vaccine? Yes   Zostavax completed No   Shingrix Completed?: No.    Education has been provided regarding the importance of this vaccine. Patient has been advised to call insurance company to determine out of pocket expense if they have not yet received this vaccine. Advised may also receive vaccine at local pharmacy or Health Dept. Verbalized acceptance and understanding.  Screening Tests Health Maintenance  Topic Date Due   COVID-19 Vaccine (1) Never done   DTaP/Tdap/Td (1 - Tdap) Never done   Lung Cancer Screening  05/15/2021   Zoster Vaccines- Shingrix (1 of 2) 06/05/2022 (Originally 05/04/1964)  INFLUENZA VACCINE  07/26/2022 (Originally 11/25/2021)   Pneumonia Vaccine 36+ Years old (2 - PCV) 10/22/2022 (Originally 04/12/2021)   Medicare Annual Wellness (AWV)  05/09/2023   Hepatitis C Screening  Completed   HPV VACCINES  Aged Out   COLONOSCOPY (Pts 45-62yr Insurance coverage will need to be confirmed)  Discontinued    Health Maintenance  Health Maintenance Due  Topic Date Due   COVID-19 Vaccine (1) Never done   DTaP/Tdap/Td (1 - Tdap) Never done   Lung Cancer Screening  05/15/2021    Colorectal cancer screening: No longer required.   Lung Cancer Screening: (Low Dose CT Chest recommended if Age 77-80years, 30 pack-year currently smoking OR have quit w/in 15years.) does qualify.   Lung Cancer Screening Referral: need tobe re ordered placed 05/08/22  Additional  Screening:  Hepatitis C Screening: Completed 11/16/18  Vision Screening: Recommended annual ophthalmology exams for early detection of glaucoma and other disorders of the eye. Is the patient up to date with their annual eye exam?  No  Who is the provider or what is the name of the office in which the patient attends annual eye exams? Encouraged to follow up with provider  If pt is not established with a provider, would they like to be referred to a provider to establish care? No .   Dental Screening: Recommended annual dental exams for proper oral hygiene  Community Resource Referral / Chronic Care Management: CRR required this visit?  No   CCM required this visit?  No      Plan:     I have personally reviewed and noted the following in the patient's chart:   Medical and social history Use of alcohol, tobacco or illicit drugs  Current medications and supplements including opioid prescriptions. Patient is not currently taking opioid prescriptions. Functional ability and status Nutritional status Physical activity Advanced directives List of other physicians Hospitalizations, surgeries, and ER visits in previous 12 months Vitals Screenings to include cognitive, depression, and falls Referrals and appointments  In addition, I have reviewed and discussed with patient certain preventive protocols, quality metrics, and best practice recommendations. A written personalized care plan for preventive services as well as general preventive health recommendations were provided to patient.     TWillette Brace LPN   16/80/3212  Nurse Notes: Pt needs clarification on asa 81 mg or 325? He is taking 81 mg at this time  Also needs to reschedule lung ca screening order placed 05/08/22,

## 2022-05-11 ENCOUNTER — Telehealth: Payer: Self-pay

## 2022-05-11 NOTE — Telephone Encounter (Signed)
Left message for pt to continue on 81 mg of Asprin as he was already taking. Made him aware the dosage was correct per PCP

## 2022-05-22 ENCOUNTER — Ambulatory Visit: Payer: 59 | Admitting: Family Medicine

## 2022-05-26 ENCOUNTER — Ambulatory Visit (INDEPENDENT_AMBULATORY_CARE_PROVIDER_SITE_OTHER): Payer: 59 | Admitting: Neurology

## 2022-05-26 ENCOUNTER — Encounter: Payer: Self-pay | Admitting: Neurology

## 2022-05-26 VITALS — BP 143/82 | HR 97 | Ht 69.0 in | Wt 166.5 lb

## 2022-05-26 DIAGNOSIS — Z720 Tobacco use: Secondary | ICD-10-CM

## 2022-05-26 DIAGNOSIS — I639 Cerebral infarction, unspecified: Secondary | ICD-10-CM | POA: Diagnosis not present

## 2022-05-26 DIAGNOSIS — R413 Other amnesia: Secondary | ICD-10-CM | POA: Diagnosis not present

## 2022-05-26 DIAGNOSIS — S069X0S Unspecified intracranial injury without loss of consciousness, sequela: Secondary | ICD-10-CM | POA: Diagnosis not present

## 2022-05-26 NOTE — Patient Instructions (Signed)
Stay on aspirin 81 mg daily Keep BP < 130/90, LDL < 70, A1C < 7.0 Recommend physical activity  Recommend alcohol and smoking cessation  See you back in 1 year

## 2022-05-26 NOTE — Progress Notes (Signed)
Chief Complaint  Patient presents with   Follow-up    Rm 12. Accompanied by daughter. No new concerns.   ASSESSMENT AND PLAN  William Gibson is a 77 y.o. male   1.  Acute left pontine stroke in December 2022 -Doing overall well, continue aspirin 81 mg daily -Discussed the importance of vascular risk factors for secondary stroke prevention BP less than 130/90, LDL less than 70, A1c less than 7.0 -Commended on recent efforts for alcohol and smoking cessation, seems motivated -Discussed PT for subjective leg weakness, wishes to hold off   2.  History of traumatic brain injury -Evidence of bifrontal, right temporal tip encephalomalacia, -At risk for partial seizure, monitor for spells    3.  Cognitive impairment -Overall stable, MoCA 22/30 -Will follow-up in 1 year or sooner if needed, at that time may return on an as-needed basis  DIAGNOSTIC DATA (LABS, IMAGING, TESTING) - I reviewed patient records, labs, notes, testing and imaging myself where available. MRI brain, MRA neck and head in Jan 2023. Suspect subacute infarct of the parasagittal left pons.  Moderate chronic microvascular ischemic changes. Bifrontal and anterior temporal encephalomalacia. Chronic right cerebellar infarct.  No large vessel occlusion or hemodynamically significant stenosis.     MEDICAL HISTORY:  William Gibson is a 77 year old male,, seen in request by his primary care physician Dr. Yong Channel, [place] for evaluation of memory loss, he is accompanied by his daughter at today's visit on May 16, 2021, the other daughter is connected through [place],  I reviewed and summarized the referring note. PMHX. HLD Alcohol abuse Rheumatoid arthritis Smoke 1 ppd x60 years  Around Christmas time, he was noted to have sudden onset slurred speech, right arm and leg weakness, leading to MRI of the brain ordered by his primary care physician, I personally reviewed the film, positive DWI lesion at left  parasagittal pons, periventricular moderate small vessel disease, right cerebellar chronic infarction.  In addition bifrontal right anterior temporal encephalomalacia, there is no evidence of large vessel disease.  Patient does report a history of traumatic brain injury in July 2021, fell backwards, landed on occipital region, with prolonged loss of consciousness,  He was not on antiplatelet agent, not taking aspirin 325 mg daily, cardiology appointment pending  Over the past few weeks, his slurred speech improved to baseline, improvement of right arm and leg weakness.  He also had a history of rheumatoid arthritis, multiple joints pain, under the care of rheumatologist, was previously treated with daily meloxicam, now stopped.  He was noted to have mild memory loss, MoCA 22/30 today, missed 5 out of 5 recall,  He used to drink a gallon of hard liquor a week, now he only drinks mild amount mixed with soft drinks, still smokes three-quarter pack a day  Update May 26, 2022 SS: Here with his daughter, [name], lives with his wife, is trying to cut back on drinking, none in 1 week, 1 cigarette a day. On aspirin 81 mg daily. Very limited driving. Not very active, has been reading. MOCA 22/30. Stable. No worsening forgetfulness or repetition. Still has urinary incontinence, urgency, frequency. PCP stopped oxybutynin. Was sleeping a lot, but arthritis, felt better lying in the bed. Doesn't want to do any PT. He thinks quitting drinking will help his bladder.  CT head in November 2023 was overall stable, ordered due to falls.  PHYSICAL EXAM:   Vitals:   05/26/22 1416  BP: (!) 143/82  Pulse: 97  Weight: 166 lb 8  oz (75.5 kg)  Height: '5\' 9"'$  (1.753 m)    Not recorded     Body mass index is 24.59 kg/m.  PHYSICAL EXAMNIATION:  Gen: NAD, conversant, well nourised, well groomed                      Cognition:         05/26/2022    2:19 PM 05/16/2021   11:00 AM  Montreal Cognitive  Assessment   Visuospatial/ Executive (0/5) 3 4  Naming (0/3) 3 3  Attention: Read list of digits (0/2) 1 2  Attention: Read list of letters (0/1) 1 1  Attention: Serial 7 subtraction starting at 100 (0/3) 3 3  Language: Repeat phrase (0/2) 2 2  Language : Fluency (0/1) 0 0  Abstraction (0/2) 2 2  Delayed Recall (0/5) 2 0  Orientation (0/6) 5 5  Total 22 22     CRANIAL NERVES: CN II: Visual William are full to confrontation. Pupils are round equal and briskly reactive to light. CN III, IV, VI: extraocular movement are normal. No ptosis. CN V: Facial sensation is intact to light touch CN VII: Face is symmetric with normal eye closure  CN VIII: Hearing is normal to causal conversation. CN IX, X: Phonation is normal. CN XI: Head turning and shoulder shrug are intact  MOTOR: Muscle strength is normal.  REFLEXES: Reflexes are 2+ and symmetric at the biceps, triceps, knees, and ankles. Plantar responses are flexor.  SENSORY: Intact to light touch  COORDINATION: There is no trunk or limb dysmetria noted.  GAIT/STANCE: Uses walker in the hall, can walk short distances independent but is cautious   REVIEW OF SYSTEMS:  Full 14 system review of systems performed and notable only for as above All other review of systems were negative.   ALLERGIES: Allergies  Allergen Reactions   Adalimumab Rash    HOME MEDICATIONS: Current Outpatient Medications  Medication Sig Dispense Refill   albuterol (VENTOLIN HFA) 108 (90 Base) MCG/ACT inhaler Inhale 2 puffs into the lungs every 6 (six) hours as needed for wheezing or shortness of breath. 1 each 2   aspirin EC 81 MG tablet Take 81 mg by mouth daily. Swallow whole.     CIMZIA PREFILLED 2 X 200 MG/ML KIT Inject into the skin.     cyanocobalamin (VITAMIN B12) 1000 MCG tablet Take 1 tablet (1,000 mcg total) by mouth daily. 90 tablet 3   omeprazole (PRILOSEC) 20 MG capsule Take 1 capsule (20 mg total) by mouth daily. 90 capsule 3    rosuvastatin (CRESTOR) 10 MG tablet Take 1 tablet (10 mg total) by mouth daily. 90 tablet 3   sulfaSALAzine (AZULFIDINE) 500 MG EC tablet Take 500 mg by mouth 4 (four) times daily.     thiamine (VITAMIN B-1) 100 MG tablet Take 1 tablet (100 mg total) by mouth daily. 90 tablet 3   oxybutynin (DITROPAN) 5 MG tablet Take 5 mg by mouth 3 (three) times daily. (Patient not taking: Reported on 05/26/2022)     No current facility-administered medications for this visit.    PAST MEDICAL HISTORY: Past Medical History:  Diagnosis Date   Abnormal liver function tests 08/06/8784   Alcoholic liver disease ([place]) 12/15/2018   Alcoholism ([place])    Elevated LDL cholesterol level 05/22/2013   Epistaxis    Erectile dysfunction 05/22/2013   Frequent falls 11/09/2018   GERD (gastroesophageal reflux disease)    Hyperammonemia ([place]) 11/15/2018   Memory change 08/17/2017  RA (rheumatoid arthritis) ([place]) 08/17/2017   RBBB 12/15/2018   Rheumatoid arthritis ([place]) 2010   Shuffling gait 12/19/2018   Tobacco abuse 04/26/2012   Weight loss, unintentional 11/09/2018   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 12/15/2018    PAST SURGICAL HISTORY: Past Surgical History:  Procedure Laterality Date   NASAL ENDOSCOPY WITH EPISTAXIS CONTROL N/A 04/12/2018   Procedure: NASAL ENDOSCOPY WITH EPISTAXIS CONTROL;  Surgeon: Melissa Montane, MD;  Location: [facility];  Service: ENT;  Laterality: N/A;   VASECTOMY     VASECTOMY REVERSAL      FAMILY HISTORY: Family History  Problem Relation Age of Onset   Heart disease Mother    Lung cancer Father    Heart attack Father    CAD Brother    Aneurysm Brother    Colon cancer Neg Hx    Esophageal cancer Neg Hx    Stomach cancer Neg Hx     SOCIAL HISTORY: Social History   Socioeconomic History   Marital status: Married    Spouse name: Not on file   Number of children: Not on file   Years of education: Not on file   Highest education level: Not on file  Occupational History    Occupation: retired  Tobacco Use   Smoking status: Every Day    Packs/day: 1.50    Years: 58.00    Total pack years: 87.00    Types: Cigarettes   Smokeless tobacco: Never   Tobacco comments:    Pt states he smokes 3/4 of a  ppd   Vaping Use   Vaping Use: Never used  Substance and Sexual Activity   Alcohol use: Not Currently    Comment: 1/5 whiskey a day prior; none in last two weeks as of 05/16/21   Drug use: No   Sexual activity: Not Currently  Other Topics Concern   Not on file  Social History Narrative   Married. 3 children. 8 grandkids. 1 greatgrandkid.       Retired from being self employed Psychologist, counselling         Does not have a living will.   Desires CPR, does not want prolonged life support if futile.         05/16/2021   Caffeine: "a little bit", some days none, sometimes 1 cup   Social Determinants of Health   Financial Resource Strain: Low Risk  (05/08/2022)   Overall Financial Resource Strain (CARDIA)    Difficulty of Paying Living Expenses: Not hard at all  Food Insecurity: No Food Insecurity (05/08/2022)   Hunger Vital Sign    Worried About Running Out of Food in the Last Year: Never true    Ran Out of Food in the Last Year: Never true  Transportation Needs: No Transportation Needs (05/08/2022)   PRAPARE - Hydrologist (Medical): No    Lack of Transportation (Non-Medical): No  Physical Activity: Sufficiently Active (05/08/2022)   Exercise Vital Sign    Days of Exercise per Week: 3 days    Minutes of Exercise per Session: 60 min  Stress: No Stress Concern Present (05/08/2022)   [place]    Feeling of Stress : Not at all  Social Connections: Moderately Isolated (05/08/2022)   Social Connection and Isolation Panel [NHANES]    Frequency of Communication with Friends and Family: Twice a week    Frequency of Social Gatherings with Friends and Family: Twice a  week     Attends Religious Services: Never    Active Member of Clubs or Organizations: No    Attends Archivist Meetings: Never    Marital Status: Married  Human resources officer Violence: Not At Risk (05/08/2022)   Humiliation, Afraid, Rape, and Kick questionnaire    Fear of Current or Ex-Partner: No    Emotionally Abused: No    Physically Abused: No    Sexually Abused: No      Evangeline Dakin, DNP  [facility] [address], [place], [place] [address] [phone]

## 2022-06-02 ENCOUNTER — Ambulatory Visit: Payer: 59 | Admitting: Family Medicine

## 2022-06-02 ENCOUNTER — Encounter: Payer: Self-pay | Admitting: Family Medicine

## 2022-06-02 NOTE — Progress Notes (Signed)
I was running behind and patient had another visit to attend- he opted to reschedule which I Understand given circumstance.

## 2022-06-16 ENCOUNTER — Ambulatory Visit: Payer: 59 | Admitting: Family Medicine

## 2022-06-24 DIAGNOSIS — M0579 Rheumatoid arthritis with rheumatoid factor of multiple sites without organ or systems involvement: Secondary | ICD-10-CM | POA: Diagnosis not present

## 2022-06-24 DIAGNOSIS — M25552 Pain in left hip: Secondary | ICD-10-CM | POA: Diagnosis not present

## 2022-06-24 DIAGNOSIS — Z79899 Other long term (current) drug therapy: Secondary | ICD-10-CM | POA: Diagnosis not present

## 2022-06-24 DIAGNOSIS — M7062 Trochanteric bursitis, left hip: Secondary | ICD-10-CM | POA: Diagnosis not present

## 2022-06-24 DIAGNOSIS — M1991 Primary osteoarthritis, unspecified site: Secondary | ICD-10-CM | POA: Diagnosis not present

## 2022-06-24 DIAGNOSIS — Z6824 Body mass index (BMI) 24.0-24.9, adult: Secondary | ICD-10-CM | POA: Diagnosis not present

## 2022-07-14 ENCOUNTER — Ambulatory Visit (INDEPENDENT_AMBULATORY_CARE_PROVIDER_SITE_OTHER): Payer: 59 | Admitting: Family Medicine

## 2022-07-14 ENCOUNTER — Encounter: Payer: Self-pay | Admitting: Family Medicine

## 2022-07-14 VITALS — BP 120/72 | HR 108 | Temp 98.2°F | Ht 69.0 in | Wt 159.8 lb

## 2022-07-14 DIAGNOSIS — R110 Nausea: Secondary | ICD-10-CM

## 2022-07-14 DIAGNOSIS — G8191 Hemiplegia, unspecified affecting right dominant side: Secondary | ICD-10-CM

## 2022-07-14 DIAGNOSIS — J441 Chronic obstructive pulmonary disease with (acute) exacerbation: Secondary | ICD-10-CM

## 2022-07-14 DIAGNOSIS — F17200 Nicotine dependence, unspecified, uncomplicated: Secondary | ICD-10-CM

## 2022-07-14 DIAGNOSIS — K709 Alcoholic liver disease, unspecified: Secondary | ICD-10-CM

## 2022-07-14 DIAGNOSIS — J439 Emphysema, unspecified: Secondary | ICD-10-CM

## 2022-07-14 DIAGNOSIS — M069 Rheumatoid arthritis, unspecified: Secondary | ICD-10-CM | POA: Diagnosis not present

## 2022-07-14 DIAGNOSIS — Z0001 Encounter for general adult medical examination with abnormal findings: Secondary | ICD-10-CM | POA: Diagnosis not present

## 2022-07-14 DIAGNOSIS — E782 Mixed hyperlipidemia: Secondary | ICD-10-CM | POA: Diagnosis not present

## 2022-07-14 DIAGNOSIS — F1021 Alcohol dependence, in remission: Secondary | ICD-10-CM | POA: Diagnosis not present

## 2022-07-14 DIAGNOSIS — M7062 Trochanteric bursitis, left hip: Secondary | ICD-10-CM

## 2022-07-14 DIAGNOSIS — I700 Atherosclerosis of aorta: Secondary | ICD-10-CM

## 2022-07-14 DIAGNOSIS — R000 Tachycardia, unspecified: Secondary | ICD-10-CM

## 2022-07-14 DIAGNOSIS — Z0000 Encounter for general adult medical examination without abnormal findings: Secondary | ICD-10-CM

## 2022-07-14 DIAGNOSIS — F325 Major depressive disorder, single episode, in full remission: Secondary | ICD-10-CM

## 2022-07-14 MED ORDER — ONDANSETRON 4 MG PO TBDP: 4.0000 mg | ORAL_TABLET | Freq: Three times a day (TID) | ORAL | 0 refills | Status: DC | PRN

## 2022-07-14 MED ORDER — AMOXICILLIN-POT CLAVULANATE 875-125 MG PO TABS: 1.0000 | ORAL_TABLET | Freq: Two times a day (BID) | ORAL | 0 refills | Status: AC

## 2022-07-14 MED ORDER — PREDNISONE 20 MG PO TABS: ORAL_TABLET | ORAL | 0 refills | Status: DC

## 2022-07-14 MED ORDER — ALBUTEROL SULFATE HFA 108 (90 BASE) MCG/ACT IN AERS: 2.0000 | INHALATION_SPRAY | Freq: Four times a day (QID) | RESPIRATORY_TRACT | 2 refills | Status: DC | PRN

## 2022-07-14 NOTE — Assessment & Plan Note (Addendum)
#   COPD exacerbation S: Patient presents with about 5-6  days of symptoms. Symptoms started when he cut back on cigarettes. No known sick contacts.  Symptoms include: Increase in cough No Increased shortness of breath noted increase in sputum production noted change in sputum color Increase in frequency of wheeze.  -She has not tried albuterol yet  A/P: Patient presents with COPD exacerbation based on increasing sputum production, change in sputum color, increased cough. Will cover with course of prednisone . Considered antibiotic and opted in with Augmentin.  -See below but patient seems to be having issues with swallowing with thick sputum production-recommended Mucinex but also wonder if this is contributing to his nausea issues

## 2022-07-14 NOTE — Patient Instructions (Addendum)
Let us know if you decide to get your covid vaccine.  Recommend updating eye exam  Trial prednisone for hip bursitis and also should help with COPD flare up. I wonder if either copd flare up or pain from hip is causing nausea. Also treating COPD with antibiotic- if not making significant improvement in 48 hours let me know or seek care if worsens  I also sent in nausea medicine for you  We will either call you (or see alternate below) within two weeks about your referral to lung cancer screening program . Our referral specialist will sometimes also send you a mychart link once referral is approved and then you will call the # listed on there (let us know if you do not see this within 2 weeks or have not received call) -also consider albuterol scheudled every 6 hours for now and over the counter mucinext to loosen sputum -stay hydrated -call 911 if worsening or can't keep fluids down  Please stop by lab before you go If you have mychart- we will send your results within 3 business days of [place] receiving them.  If you do not have mychart- we will call you about results within 5 business days of [place] receiving them.  *please also note that you will see labs on mychart as soon as they post. I will later go in and write notes on them- will say "notes from Dr. Yong Channel"    Recommended follow up: Return in about 1 month (around 08/14/2022) for followup or sooner if needed.Schedule b4 you leave.

## 2022-07-14 NOTE — Progress Notes (Signed)
Phone [phone] In person visit   Subjective:   William Gibson is a 77 y.o. year old very pleasant male patient who presents for/with See problem oriented charting  Past Medical History-  Patient Active Problem List   Diagnosis Date Noted   Right hemiparesis ([place]) 10/21/2021    Priority: High   History of stroke 05/16/2021    Priority: High   Emphysema lung ([place]) 06/03/2020    Priority: High   History of subarachnoid hemorrhage 11/04/2019    Priority: High   Head injury 11/04/2019    Priority: High   Alcoholic liver disease ([place]) 12/15/2018    Priority: High   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 12/15/2018    Priority: High   RA (rheumatoid arthritis) ([place]) 08/17/2017    Priority: High   Alcohol dependence in early full remission ([place]) 12/15/2016    Priority: High   Tobacco abuse 04/26/2012    Priority: High   Aortic atherosclerosis ([place]) 06/03/2020    Priority: Medium    Major depression in full remission ([place]) 01/12/2020    Priority: Medium    Insomnia 01/12/2020    Priority: Medium    Vascular headache     Priority: Medium    Hyperlipidemia 02/06/2019    Priority: Medium    Shuffling gait 12/19/2018    Priority: Medium    Frequent falls 11/09/2018    Priority: Medium    Memory change 08/17/2017    Priority: Medium    GERD (gastroesophageal reflux disease) 05/23/2014    Priority: Medium    Plantar fasciitis 05/20/2021    Priority: Low   History of pancreatitis 01/12/2020    Priority: Low   Chronic post-traumatic headache 12/13/2019    Priority: Low   Hyponatremia     Priority: Low   RBBB 12/15/2018    Priority: Low   Hyperammonemia ([place]) 11/15/2018    Priority: Low   Osteoarthritis 01/30/2016    Priority: Low   Erectile dysfunction 05/22/2013    Priority: Low   CVA (cerebral vascular accident) ([place]) 05/26/2022    Medications- reviewed and updated Current Outpatient Medications  Medication Sig Dispense Refill    amoxicillin-clavulanate (AUGMENTIN) 875-125 MG tablet Take 1 tablet by mouth 2 (two) times daily for 7 days. 14 tablet 0   aspirin EC 81 MG tablet Take 81 mg by mouth daily. Swallow whole.     CIMZIA PREFILLED 2 X 200 MG/ML KIT Inject into the skin.     cyanocobalamin (VITAMIN B12) 1000 MCG tablet Take 1 tablet (1,000 mcg total) by mouth daily. 90 tablet 3   folic acid (FOLVITE) 1 MG tablet Take 1 mg by mouth daily.     omeprazole (PRILOSEC) 20 MG capsule Take 1 capsule (20 mg total) by mouth daily. 90 capsule 3   ondansetron (ZOFRAN-ODT) 4 MG disintegrating tablet Take 1 tablet (4 mg total) by mouth every 8 (eight) hours as needed for nausea or vomiting. 20 tablet 0   oxybutynin (DITROPAN) 5 MG tablet Take 5 mg by mouth 3 (three) times daily.     predniSONE (DELTASONE) 20 MG tablet Take 2 pills for 3 days, 1 pill for 4 days 10 tablet 0   rosuvastatin (CRESTOR) 10 MG tablet Take 1 tablet (10 mg total) by mouth daily. 90 tablet 3   sulfaSALAzine (AZULFIDINE) 500 MG EC tablet Take 500 mg by mouth 4 (four) times daily.     thiamine (VITAMIN B-1) 100 MG tablet Take 1 tablet (100 mg total)  by mouth daily. 90 tablet 3   traZODone (DESYREL) 50 MG tablet Take 25-50 mg by mouth at bedtime as needed.     albuterol (VENTOLIN HFA) 108 (90 Base) MCG/ACT inhaler Inhale 2 puffs into the lungs every 6 (six) hours as needed for wheezing or shortness of breath. 1 each 2   No current facility-administered medications for this visit.     Objective:  BP 120/72   Pulse (!) 108   Temp 98.2 F (36.8 C)   Ht 5\' 9"  (1.753 m)   Wt 159 lb 12.8 oz (72.5 kg)   SpO2 96%   BMI 23.60 kg/m  Gen: NAD, resting comfortably, labored breathing, frequent cough MSK: Significant pain with palpation over left trochanteric bursa EKG: sinus tachycardia rhythm with rate 108 and PACs noted, right axis, normal intervals other than prolonged QRS, no hypertrophy, no st or t wave changes- other than RBBB noted. Stable from EKG 03/05/2022  though tachycardia is new    Assessment and Plan   Emphysema lung ([place]) # COPD exacerbation S: Patient presents with about 5-6  days of symptoms. Symptoms started when he cut back on cigarettes. No known sick contacts.  Symptoms include: Increase in cough No Increased shortness of breath noted increase in sputum production noted change in sputum color Increase in frequency of wheeze.  -She has not tried albuterol yet  A/P: Patient presents with COPD exacerbation based on increasing sputum production, change in sputum color, increased cough. Will cover with course of prednisone . Considered antibiotic and opted in with Augmentin.  -See below but patient seems to be having issues with swallowing with thick sputum production-recommended Mucinex but also wonder if this is contributing to his nausea issues -If worsening should seek care in the emergency room-asked him to update me in about 48 hours -Suspect tachycardia related to breathing issues but has had tachycardia in the past-index of suspicion for similar pulmonary embolism is low especially with likelihood of COPD exacerbation high  # Nausea S:started 2-3 days ago. Coughing up a lot of phlegm in last week-feels like thick phlegm is getting stuck and causing him issues with breathing. This morning had a few sips of coffee and threw it back up- noted touch of blood. Able to keep water down in small quantities. No diarrhea or constipation. Still passing gas. No abdominal pain.  Does have severe hip pain but does not think this is causing the nausea. Able to keep pills down.  No chest pain. Baseline shortness of breath with known emphysema but worsening cough and congestion as noted A/P: Nausea could be potentially related to thick sputum related to COPD exacerbation over the last 6 days-will treat with combination of Augmentin and prednisone -Pretty significant pain with trochanteric bursitis as below-also wonder if that could contribute to  his tachycardia as well as nausea -Also sent in ondansetron for nausea  #also having left hip pain- saw rheumatology- thoguht bursitis- no treatment at that time. Not currently on prednisone but tolerates. Hurts over left greater trochanteric bursa- likely bursitis- trial prednisone for COPD and suspect this will improve as well-can refer to sports medicine if needed  Recommended follow up: Return in about 1 month (around 08/14/2022) for followup or sooner if needed.Schedule b4 you leave. Future Appointments  Date Time Provider [place]  08/25/2022  2:20 PM Marin Olp, MD [facility]  05/27/2023  2:15 PM Suzzanne Cloud, NP [facility] None   Lab/Order associations:   ICD-10-CM   1. COPD  exacerbation ([place])  J44.1     2. Nausea  R11.0     3.  Left trochanteric bursitis    Meds ordered this encounter  Medications   predniSONE (DELTASONE) 20 MG tablet    Sig: Take 2 pills for 3 days, 1 pill for 4 days    Dispense:  10 tablet    Refill:  0   albuterol (VENTOLIN HFA) 108 (90 Base) MCG/ACT inhaler    Sig: Inhale 2 puffs into the lungs every 6 (six) hours as needed for wheezing or shortness of breath.    Dispense:  1 each    Refill:  2   ondansetron (ZOFRAN-ODT) 4 MG disintegrating tablet    Sig: Take 1 tablet (4 mg total) by mouth every 8 (eight) hours as needed for nausea or vomiting.    Dispense:  20 tablet    Refill:  0   amoxicillin-clavulanate (AUGMENTIN) 875-125 MG tablet    Sig: Take 1 tablet by mouth 2 (two) times daily for 7 days.    Dispense:  14 tablet    Refill:  0    Return precautions advised.  Garret Reddish, MD

## 2022-07-14 NOTE — Progress Notes (Signed)
Phone: [phone]   Subjective:  Patient presents today for their annual physical. Chief complaint-noted.   See problem oriented charting- ROS- full  review of systems was completed and negative  except for: Nausea, shortness of breath, increased cough  The following were reviewed and entered/updated in epic: Past Medical History:  Diagnosis Date   Abnormal liver function tests 10/11/735   Alcoholic liver disease ([place]) 12/15/2018   Alcoholism ([place])    Elevated LDL cholesterol level 05/22/2013   Epistaxis    Erectile dysfunction 05/22/2013   Frequent falls 11/09/2018   GERD (gastroesophageal reflux disease)    Hyperammonemia ([place]) 11/15/2018   Memory change 08/17/2017   RA (rheumatoid arthritis) ([place]) 08/17/2017   RBBB 12/15/2018   Rheumatoid arthritis ([place]) 2010   Shuffling gait 12/19/2018   Tobacco abuse 04/26/2012   Weight loss, unintentional 11/09/2018   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 12/15/2018   Patient Active Problem List   Diagnosis Date Noted   Right hemiparesis ([place]) 10/21/2021    Priority: High   History of stroke 05/16/2021    Priority: High   Emphysema lung ([place]) 06/03/2020    Priority: High   History of subarachnoid hemorrhage 11/04/2019    Priority: High   Head injury 11/04/2019    Priority: High   Alcoholic liver disease ([place]) 12/15/2018    Priority: High   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 12/15/2018    Priority: High   RA (rheumatoid arthritis) ([place]) 08/17/2017    Priority: High   Alcohol dependence in early full remission ([place]) 12/15/2016    Priority: High   Tobacco abuse 04/26/2012    Priority: High   Aortic atherosclerosis ([place]) 06/03/2020    Priority: Medium    Major depression in full remission ([place]) 01/12/2020    Priority: Medium    Insomnia 01/12/2020    Priority: Medium    Vascular headache     Priority: Medium    Hyperlipidemia 02/06/2019    Priority: Medium    Shuffling gait 12/19/2018    Priority: Medium     Frequent falls 11/09/2018    Priority: Medium    Memory change 08/17/2017    Priority: Medium    GERD (gastroesophageal reflux disease) 05/23/2014    Priority: Medium    Plantar fasciitis 05/20/2021    Priority: Low   History of pancreatitis 01/12/2020    Priority: Low   Chronic post-traumatic headache 12/13/2019    Priority: Low   Hyponatremia     Priority: Low   RBBB 12/15/2018    Priority: Low   Hyperammonemia ([place]) 11/15/2018    Priority: Low   Osteoarthritis 01/30/2016    Priority: Low   Erectile dysfunction 05/22/2013    Priority: Low   CVA (cerebral vascular accident) ([place]) 05/26/2022   Past Surgical History:  Procedure Laterality Date   NASAL ENDOSCOPY WITH EPISTAXIS CONTROL N/A 04/12/2018   Procedure: NASAL ENDOSCOPY WITH EPISTAXIS CONTROL;  Surgeon: Melissa Montane, MD;  Location: [facility];  Service: ENT;  Laterality: N/A;   VASECTOMY     VASECTOMY REVERSAL      Family History  Problem Relation Age of Onset   Heart disease Mother    Lung cancer Father    Heart attack Father    CAD Brother    Aneurysm Brother    Colon cancer Neg Hx    Esophageal cancer Neg Hx    Stomach cancer Neg Hx     Medications- reviewed and updated Current Outpatient  Medications  Medication Sig Dispense Refill   amoxicillin-clavulanate (AUGMENTIN) 875-125 MG tablet Take 1 tablet by mouth 2 (two) times daily for 7 days. 14 tablet 0   aspirin EC 81 MG tablet Take 81 mg by mouth daily. Swallow whole.     CIMZIA PREFILLED 2 X 200 MG/ML KIT Inject into the skin.     cyanocobalamin (VITAMIN B12) 1000 MCG tablet Take 1 tablet (1,000 mcg total) by mouth daily. 90 tablet 3   folic acid (FOLVITE) 1 MG tablet Take 1 mg by mouth daily.     omeprazole (PRILOSEC) 20 MG capsule Take 1 capsule (20 mg total) by mouth daily. 90 capsule 3   ondansetron (ZOFRAN-ODT) 4 MG disintegrating tablet Take 1 tablet (4 mg total) by mouth every 8 (eight) hours as needed for nausea or vomiting. 20 tablet 0    oxybutynin (DITROPAN) 5 MG tablet Take 5 mg by mouth 3 (three) times daily.     predniSONE (DELTASONE) 20 MG tablet Take 2 pills for 3 days, 1 pill for 4 days 10 tablet 0   rosuvastatin (CRESTOR) 10 MG tablet Take 1 tablet (10 mg total) by mouth daily. 90 tablet 3   sulfaSALAzine (AZULFIDINE) 500 MG EC tablet Take 500 mg by mouth 4 (four) times daily.     thiamine (VITAMIN B-1) 100 MG tablet Take 1 tablet (100 mg total) by mouth daily. 90 tablet 3   traZODone (DESYREL) 50 MG tablet Take 25-50 mg by mouth at bedtime as needed.     albuterol (VENTOLIN HFA) 108 (90 Base) MCG/ACT inhaler Inhale 2 puffs into the lungs every 6 (six) hours as needed for wheezing or shortness of breath. 1 each 2   No current facility-administered medications for this visit.    Allergies-reviewed and updated Allergies  Allergen Reactions   Adalimumab Rash    Social History   Social History Narrative   Married. 3 children. 8 grandkids. 1 greatgrandkid.       Retired from being self employed Psychologist, counselling         Does not have a living will.   Desires CPR, does not want prolonged life support if futile.         05/16/2021   Caffeine: "a little bit", some days none, sometimes 1 cup   Objective  Objective:  BP 120/72   Pulse (!) 108   Temp 98.2 F (36.8 C)   Ht 5\' 9"  (1.753 m)   Wt 159 lb 12.8 oz (72.5 kg)   SpO2 96%   BMI 23.60 kg/m  Gen: NAD, resting comfortably, appears to have some labored breathing and frequent cough HEENT: Mucous membranes are dry.  Pharynx with mild erythema Neck: no thyromegaly CV: RRR no murmurs rubs or gallops Lungs: CTAB no crackles, wheeze, rhonchi at the moment Abdomen: soft/nontender/nondistended/normal bowel sounds. No rebound or guarding.  Ext: no edema or calf pain or tenderness Skin: warm, dry Neuro: grossly normal, moves all extremities, PERRLA    Assessment and Plan  77 y.o. male presenting for annual physical.  Health Maintenance counseling: 1.  Anticipatory guidance: Patient counseled regarding regular dental exams - false teeth so does not see dentist, eye exams -recommended yearly,  avoiding smoking and second hand smoke- trying to cut down on smoking- one over last 6 days , limiting alcohol to 2 beverages per day - down for last 6 days but had been 3-4 per day- discussed known alcohol related liver injury and need to abstain, no illicit drugs .  2. Risk factor reduction:  Advised patient of need for regular exercise and diet rich and fruits and vegetables to reduce risk of heart attack and stroke.  Exercise- still trying to work in yard but not able to at this juncture with lungs and stability.  Diet/weight management-down 12 lbs from cpe in 2022- 6 lbs down this year- feels like low appetite in last week contributing.  Wt Readings from Last 3 Encounters:  07/14/22 159 lb 12.8 oz (72.5 kg)  06/02/22 165 lb 3.2 oz (74.9 kg)  05/26/22 166 lb 8 oz (75.5 kg)  3. Immunizations/screenings/ancillary studies- opts out of covid and flu and shingrix- opts out  Immunization History  Administered Date(s) Administered   Fluad Quad(high Dose 65+) 04/18/2019, 01/12/2020   Influenza, High Dose Seasonal PF 03/11/2021   Pneumococcal Polysaccharide-23 04/12/2020   4. Prostate cancer screening- spurious increase in 2020 but went back to normal range- no further repeat planned  Lab Results  Component Value Date   PSA 1.02 12/04/2020   PSA 6.23 (H) 05/19/2018   PSA 2.14 12/15/2016   5. Colon cancer screening - 07/08/13 with 10 year repeat- but will graduate and not need likely  6. Skin cancer screening- no dermatologist.  advised regular sunscreen use. Denies worrisome, changing, or new skin lesions.  7. current smoker- 1 PPD other than last 3-4 days. Recommended full cessation. Refer back to   lung cancer screening program . Will get UA with labs 8. STD screening - only active with wife  Status of chronic or acute concerns   # COPD  exacerbation S: Patient presents with about 5-6  days of symptoms. Symptoms started when he cut back on cigarettes. No known sick contacts.  Symptoms include: Increase in cough No Increased shortness of breath noted increase in sputum production noted change in sputum color Increase in frequency of wheeze.  -She has not tried albuterol yet  A/P: Patient presents with COPD exacerbation based on increasing sputum production, change in sputum color, increased cough. Will cover with course of prednisone . Considered antibiotic and opted in with Augmentin.   #Alcoholism- November 03, 2019-major fall with head trauma/skull fracture/subarachnoid hemorrhage.  Has improved with cessation but he gradually increased alcohol back to 3 a day by 2023- today he reports 5-6 days no alcohol and only 1 cigarette in that time. Prior to that was drinking 3-4 beverages a day.  Encouraged full cessation. Wonder if nausea contributed to by withdrawal -still on b12 and thiamine   # Alcoholic liver disease ([place])- check liver function with ongoing alcohol intake- advised alcohol cessation  #Hyperlipidemia/aortic atherosclerosis #Hyperlipidemia S: CAD on lung cancer screening x 2 vessels. Rosuvastatin 10 mg- often misses pill unless family directly prompts him- daughter. Still on aspirin 81mg  daily Lab Results  Component Value Date   CHOL 180 03/05/2022   HDL 78.00 03/05/2022   LDLCALC 79 03/05/2022   LDLDIRECT 152.0 04/29/2021   TRIG 115.0 03/05/2022   CHOLHDL 2 03/05/2022  A/P:hopefully improved- prefer LDL under 70- update direct ldl today - was off meds last visit Aortic atherosclerosis (presumed stable)- LDL goal ideally <70 - likely continue current medications   #. Pulmonary emphysema, unspecified emphysema type ([place]) -See separate note for exacerbation concerns  #. Rheumatoid arthritis, involving unspecified site, unspecified whether rheumatoid factor present ([place]) - stable on cimzia and sulfasalazine   -Immunosuppression with Cimzia noted-1 reason we were more likely to use antibiotic with COPD exacerbation  #. Right hemiparesis ([place]) as consequence of prior  stroke #. Aortic atherosclerosis ([place]) #. Mixed hyperlipidemia -Right hemiparesis noted per baseline - With hyperlipidemia/aortic atherosclerosis prefer LDL under 70-update lipid panel to assess but for now continue current medication with rosuvastatin 10 mg daily  #. Major depressive disorder with single episode, in full remission ([place]) -Uses trazodone for sleep -Family reports anxiety issues with health-considered medication but we opted to try to evaluate nausea/COPD issues first  #oxybutynin- for incontinence some help with oxybutynin but still has to wear a pad   Recommended follow up: Return in about 1 month (around 08/14/2022) for followup or sooner if needed.Schedule b4 you leave. Future Appointments  Date Time Provider [place]  08/25/2022  2:20 PM Marin Olp, MD [facility]  05/27/2023  2:15 PM Suzzanne Cloud, NP [facility] None   Lab/Order associations: fasting   ICD-10-CM   1. Preventative health care  Z00.00     2. COPD exacerbation ([place])  J44.1     3. Nausea  R11.0     4. Alcohol dependence in early full remission ([place])  F10.21     5. Alcoholic liver disease ([place])  K70.9     6. Rheumatoid arthritis, involving unspecified site, unspecified whether rheumatoid factor present ([place])  M06.9     7. Right hemiparesis ([facility])  G81.91     8. Aortic atherosclerosis ([facility])  I70.0     9. Mixed hyperlipidemia  E78.2 CBC with Differential/Platelet    Comprehensive metabolic panel    LDL cholesterol, direct    10. Major depressive disorder with single episode, in full remission ([place])  F32.5     11. Current smoker  F17.200 Urinalysis, Routine w reflex microscopic    Ambulatory Referral for Lung Cancer Scre    12. Tachycardia  R00.0 EKG 12-Lead    13. Pulmonary emphysema, unspecified emphysema type ([place])   J43.9      Return precautions advised.  Garret Reddish, MD

## 2022-07-15 LAB — CBC WITH DIFFERENTIAL/PLATELET
Basophils Absolute: 0.1 10*3/uL (ref 0.0–0.1)
Basophils Relative: 0.9 % (ref 0.0–3.0)
Eosinophils Absolute: 0.1 10*3/uL (ref 0.0–0.7)
Eosinophils Relative: 0.4 % (ref 0.0–5.0)
HCT: 43.6 % (ref 39.0–52.0)
Hemoglobin: 14.5 g/dL (ref 13.0–17.0)
Lymphocytes Relative: 10.7 % — ABNORMAL LOW (ref 12.0–46.0)
Lymphs Abs: 1.5 10*3/uL (ref 0.7–4.0)
MCHC: 33.2 g/dL (ref 30.0–36.0)
MCV: 101.9 fl — ABNORMAL HIGH (ref 78.0–100.0)
Monocytes Absolute: 1.5 10*3/uL — ABNORMAL HIGH (ref 0.1–1.0)
Monocytes Relative: 11.1 % (ref 3.0–12.0)
Neutro Abs: 10.7 10*3/uL — ABNORMAL HIGH (ref 1.4–7.7)
Neutrophils Relative %: 76.9 % (ref 43.0–77.0)
Platelets: 174 10*3/uL (ref 150.0–400.0)
RBC: 4.27 Mil/uL (ref 4.22–5.81)
RDW: 15.1 % (ref 11.5–15.5)
WBC: 14 10*3/uL — ABNORMAL HIGH (ref 4.0–10.5)

## 2022-07-15 LAB — COMPREHENSIVE METABOLIC PANEL
ALT: 11 U/L (ref 0–53)
AST: 17 U/L (ref 0–37)
Albumin: 3.7 g/dL (ref 3.5–5.2)
Alkaline Phosphatase: 83 U/L (ref 39–117)
BUN: 24 mg/dL — ABNORMAL HIGH (ref 6–23)
CO2: 24 mEq/L (ref 19–32)
Calcium: 9.7 mg/dL (ref 8.4–10.5)
Chloride: 97 mEq/L (ref 96–112)
Creatinine, Ser: 1.33 mg/dL (ref 0.40–1.50)
GFR: 51.67 mL/min — ABNORMAL LOW (ref >60.00)
Glucose, Bld: 107 mg/dL — ABNORMAL HIGH (ref 70–99)
Potassium: 4.5 mEq/L (ref 3.5–5.1)
Sodium: 134 mEq/L — ABNORMAL LOW (ref 135–145)
Total Bilirubin: 0.9 mg/dL (ref 0.2–1.2)
Total Protein: 8.2 g/dL (ref 6.0–8.3)

## 2022-07-15 LAB — URINALYSIS, ROUTINE W REFLEX MICROSCOPIC
Ketones, ur: 15 — AB
Leukocytes,Ua: NEGATIVE
Nitrite: NEGATIVE
Specific Gravity, Urine: 1.02 (ref 1.000–1.030)
Total Protein, Urine: 100 — AB
Urine Glucose: 100 — AB
Urobilinogen, UA: 1 (ref 0.0–1.0)
pH: 6.5 (ref 5.0–8.0)

## 2022-07-15 LAB — LDL CHOLESTEROL, DIRECT: Direct LDL: 51 mg/dL

## 2022-07-16 ENCOUNTER — Other Ambulatory Visit: Payer: Self-pay

## 2022-07-16 DIAGNOSIS — R3129 Other microscopic hematuria: Secondary | ICD-10-CM

## 2022-07-20 ENCOUNTER — Other Ambulatory Visit: Payer: 59

## 2022-07-23 ENCOUNTER — Other Ambulatory Visit: Payer: 59

## 2022-08-06 ENCOUNTER — Other Ambulatory Visit: Payer: Self-pay | Admitting: Family Medicine

## 2022-08-25 ENCOUNTER — Ambulatory Visit: Payer: 59 | Admitting: Family Medicine

## 2022-10-30 ENCOUNTER — Telehealth: Payer: Self-pay | Admitting: Family Medicine

## 2022-10-30 NOTE — Telephone Encounter (Signed)
Prescription Request  10/30/2022  LOV: 07/14/2022  What is the name of the medication or equipment? albuterol (VENTOLIN HFA) 108 (90 Base) MCG/ACT inhaler   Have you contacted your pharmacy to request a refill? Yes   Which pharmacy would you like this sent to?  [id number] [place], [place] - [address] [place] [address] Phone: [phone] Fax: [phone]     Patient notified that their request is being sent to the clinical staff for review and that they should receive a response within 2 business days.   Please advise at Mobile [phone] (mobile)

## 2022-11-02 MED ORDER — ALBUTEROL SULFATE HFA 108 (90 BASE) MCG/ACT IN AERS: INHALATION_SPRAY | RESPIRATORY_TRACT | 3 refills | Status: DC

## 2022-11-02 NOTE — Telephone Encounter (Signed)
Rx sent to requested pharmacy

## 2023-05-10 ENCOUNTER — Encounter: Payer: Self-pay | Admitting: Acute Care

## 2023-05-27 ENCOUNTER — Ambulatory Visit: Payer: 59 | Admitting: Neurology

## 2023-06-04 ENCOUNTER — Other Ambulatory Visit: Payer: Self-pay | Admitting: Family Medicine

## 2023-06-23 ENCOUNTER — Telehealth: Payer: Self-pay | Admitting: Family Medicine

## 2023-06-23 ENCOUNTER — Ambulatory Visit: Payer: Self-pay | Admitting: Family Medicine

## 2023-06-23 NOTE — Telephone Encounter (Signed)
 Call from [facility], spoke to front desk, office reporting pt experiencing dizziness and SOB, office scheduled pt for 3/13, requesting triage to see if need sooner appt. Speaking to pt wife.  Chief Complaint: dizziness Symptoms: dizziness, weakness, coughing fits, SOB, gait changes Frequency: unsure Pertinent Negatives: Patient wife denies slurred speech, confusion Disposition: [] 911 / [] ED /[] Urgent Care (no appt availability in office) / [] Appointment(In office/virtual)/ []  [facility]/ [] Home Care/ [x] Refused Recommended Disposition /[] [facility]/ []  Follow-up with PCP Additional Notes: Pt wife is poor historian. Pt wife reporting pt having dizziness with "whole room is spinning," occurring "mainly in mornings" that she "think comes and goes," pt "has to lay back down then eventually I don't know if it, [pause] but he has to grab hold of his walker just to get moving." Pt wife confirms pt is "weak, using his walker majority of the time to even walk now." Pt wife reporting that he "shuffles his feet" now, confirms trouble walking even with the walker sometimes, "has to be very careful" walking. Pt wife confirms dizziness and feet shuffling have been complaints for 2-3 weeks with dizziness "maybe longer than that, can't remember." Pt wife unsure about intensity, frequency, or aggravating/alleviating factors for dizziness, SOB, or weakness. Wife "can't answer" whether pt having headache, weakness, numbness, vomiting, earache, or other, "he don't tell me if he does." Pt wife reporting "probably has that" COPD, pt has emphysema per pt chart, confirms pt has "just awful cough, gagging like trying to cough things up," wife unsure if coughing things up. Pt wife confirms that he is having to catch his breath more, "has to go very slow, if walking outside with walker, has to stop more frequently to catch his breath." Pt wife reporting pt "smokes about 1.5-2 packs a day," and that "a  month ago" pt "stopped all his medication, said he wasn't taking any more medication, said might as well throw them away, so that's what I did," including baby aspirin, pt hx of CVA and stroke. When asked about facial drooping or weakness to one side of body, wife reporting pt been "complaining about one side of his body that hurts around his hip area in one leg that gives him a lot of pain." Advised pt be examined in hospital right away, pt wife refusing stating "not going to the hospital, people are dying there" and stating it's crowded. Advised strongly that pt be examined in hospital shortly due to concern for stroke with his hx. Pt wife refusing 911 and ED. Connected with [facility], informed of refusal, front desk placed nurse on hold to inquire about appt for pt today, front desk states they will call pt wife back shortly to arrange appt, Dr. Durene Cal not in office today. Informed wife, advised that HP message would be sent to office and that front desk will call to schedule appt today. Pt wife reporting "we can't come today." Advised again that the current recommendation is that the pt be examined in the hospital asap but that the office will call them soon. Pt wife verbalized understanding.  Reason for Disposition  Sounds like a life-threatening emergency to the triager  Answer Assessment - Initial Assessment Questions 1. DESCRIPTION: "Describe your dizziness."     Think it comes and goes, mainly in mornings 2. VERTIGO: "Do you feel like either you or the room is spinning or tilting?"      Whole room is spinning 3. LIGHTHEADED: "Do you feel lightheaded?" (e.g., somewhat faint,  woozy, weak upon standing)     unsure 4. SEVERITY: "How bad is it?"  "Can you walk?"   - MILD: Feels slightly dizzy and unsteady, but is walking normally.   - MODERATE: Feels unsteady when walking, but not falling; interferes with normal activities (e.g., school, work).   - SEVERE: Unable to walk without falling, or requires  assistance to walk without falling.     Has to lay back down then eventually I don't know if it, but he has to grab hold of his walker just to get moving, weak, using his walker majority of the time to even walk now 5. ONSET:  "When did the dizziness begin?"     2-3 weeks ago, maybe longer than that, can't remember 6. AGGRAVATING FACTORS: "Does anything make it worse?" (e.g., standing, change in head position)     unsure 7. CAUSE: "What do you think is causing the dizziness?"     Not sure 8. RECURRENT SYMPTOM: "Have you had dizziness before?" If Yes, ask: "When was the last time?" "What happened that time?"     Not like this 9. OTHER SYMPTOMS: "Do you have any other symptoms?" (e.g., headache, weakness, numbness, vomiting, earache)     Umm long pause, can't answer that question, don't tell me if he does, no SOB, just awful cough, coughing fits, gagging like coughing things up but not sure if coughing up  Protocols used: Dizziness - Vertigo-A-AH

## 2023-06-23 NOTE — Telephone Encounter (Signed)
 Call transferred from  Triage Nurse stating William Gibson refuses to go to ED or Urgent Care. Conferred with Dr. Jon Billings who agrees with RN's assessment.  Called William Gibson- spoke to William Gibson's wife- states William Gibson refuses to be seen by anyone but Dr. Durene Cal. Able to move William Gibson up to 07/01/23. Advised spouse to seek care if if any new symptoms or if symptoms get worse. Spouse agreed.

## 2023-06-24 NOTE — Telephone Encounter (Signed)
 I am just now getting message today after the office is closed and I am out of the office tomorrow-I suspect we essentially need to retriage-the soonest we would be able to do anything would be next week here and he may once again need emergency room recommendation

## 2023-06-24 NOTE — Telephone Encounter (Signed)
 See triage note recommendation of retriage with me being out of the office-if he declines this then yes I will see him on March 6

## 2023-06-24 NOTE — Telephone Encounter (Signed)
 See below

## 2023-06-24 NOTE — Telephone Encounter (Signed)
 FYI, Triage note sent to you also.

## 2023-06-25 NOTE — Telephone Encounter (Signed)
FYI See below 

## 2023-07-01 ENCOUNTER — Encounter: Payer: Self-pay | Admitting: Family Medicine

## 2023-07-01 ENCOUNTER — Ambulatory Visit: Payer: 59 | Admitting: Family Medicine

## 2023-07-01 VITALS — BP 110/70 | HR 98 | Temp 97.7°F | Ht 69.0 in | Wt 161.0 lb

## 2023-07-01 DIAGNOSIS — I700 Atherosclerosis of aorta: Secondary | ICD-10-CM

## 2023-07-01 DIAGNOSIS — M069 Rheumatoid arthritis, unspecified: Secondary | ICD-10-CM

## 2023-07-01 DIAGNOSIS — J439 Emphysema, unspecified: Secondary | ICD-10-CM | POA: Diagnosis not present

## 2023-07-01 DIAGNOSIS — G8191 Hemiplegia, unspecified affecting right dominant side: Secondary | ICD-10-CM

## 2023-07-01 DIAGNOSIS — E782 Mixed hyperlipidemia: Secondary | ICD-10-CM

## 2023-07-01 DIAGNOSIS — F1020 Alcohol dependence, uncomplicated: Secondary | ICD-10-CM

## 2023-07-01 DIAGNOSIS — H8111 Benign paroxysmal vertigo, right ear: Secondary | ICD-10-CM

## 2023-07-01 MED ORDER — VITAMIN B-12 1000 MCG PO TABS: 1000.0000 ug | ORAL_TABLET | Freq: Every day | ORAL | 3 refills | Status: AC

## 2023-07-01 MED ORDER — ROSUVASTATIN CALCIUM 10 MG PO TABS: 10.0000 mg | ORAL_TABLET | Freq: Every day | ORAL | 3 refills | Status: AC

## 2023-07-01 MED ORDER — ASPIRIN 81 MG PO TBEC: 81.0000 mg | DELAYED_RELEASE_TABLET | Freq: Every day | ORAL | 3 refills | Status: AC

## 2023-07-01 MED ORDER — VITAMIN B-1 100 MG PO TABS: 100.0000 mg | ORAL_TABLET | Freq: Every day | ORAL | 3 refills | Status: AC

## 2023-07-01 MED ORDER — ALBUTEROL SULFATE HFA 108 (90 BASE) MCG/ACT IN AERS: INHALATION_SPRAY | RESPIRATORY_TRACT | 1 refills | Status: DC

## 2023-07-01 NOTE — Progress Notes (Signed)
 Phone [phone] In person visit   Subjective:   William Gibson is a 78 y.o. year old very pleasant male patient who presents for/with See problem oriented charting Chief Complaint  Patient presents with   Dizziness    Pt c/o dizzy spells that have been going on for 2-3 months. Pt wife states pt has not taken any meds in about 4-5 months.     Past Medical History-  Patient Active Problem List   Diagnosis Date Noted   Right hemiparesis (HCC) 10/21/2021    Priority: High   History of stroke 05/16/2021    Priority: High   Emphysema lung (HCC) 06/03/2020    Priority: High   History of subarachnoid hemorrhage 11/04/2019    Priority: High   Head injury 11/04/2019    Priority: High   Alcoholic liver disease (HCC) 12/15/2018    Priority: High   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 12/15/2018    Priority: High   RA (rheumatoid arthritis) (HCC) 08/17/2017    Priority: High   Alcohol dependence in early full remission (HCC) 12/15/2016    Priority: High   Tobacco abuse 04/26/2012    Priority: High   Aortic atherosclerosis (HCC) 06/03/2020    Priority: Medium    Major depression in full remission (HCC) 01/12/2020    Priority: Medium    Insomnia 01/12/2020    Priority: Medium    Vascular headache     Priority: Medium    Hyperlipidemia 02/06/2019    Priority: Medium    Shuffling gait 12/19/2018    Priority: Medium    Frequent falls 11/09/2018    Priority: Medium    Memory change 08/17/2017    Priority: Medium    GERD (gastroesophageal reflux disease) 05/23/2014    Priority: Medium    Plantar fasciitis 05/20/2021    Priority: Low   History of pancreatitis 01/12/2020    Priority: Low   Chronic post-traumatic headache 12/13/2019    Priority: Low   Hyponatremia     Priority: Low   RBBB 12/15/2018    Priority: Low   Hyperammonemia (HCC) 11/15/2018    Priority: Low   Osteoarthritis 01/30/2016    Priority: Low   Erectile dysfunction 05/22/2013    Priority:  Low    Medications- reviewed and updated Current Outpatient Medications  Medication Sig Dispense Refill   albuterol (VENTOLIN HFA) 108 (90 Base) MCG/ACT inhaler TAKE 2 PUFFS BY MOUTH EVERY 6 HOURS AS NEEDED FOR WHEEZE OR SHORTNESS OF BREATH 17 g 1   aspirin EC 81 MG tablet Take 1 tablet (81 mg total) by mouth daily. Swallow whole. 90 tablet 3   cyanocobalamin (VITAMIN B12) 1000 MCG tablet Take 1 tablet (1,000 mcg total) by mouth daily. 90 tablet 3   rosuvastatin (CRESTOR) 10 MG tablet Take 1 tablet (10 mg total) by mouth daily. 90 tablet 3   thiamine (VITAMIN B-1) 100 MG tablet Take 1 tablet (100 mg total) by mouth daily. 90 tablet 3   No current facility-administered medications for this visit.     Objective:  BP 110/70   Pulse 98   Temp 97.7 F (36.5 C)   Ht 5\' 9"  (1.753 m)   Wt 161 lb (73 kg)   SpO2 97%   BMI 23.78 kg/m  Gen: NAD, resting comfortably CV: RRR no murmurs rubs or gallops Lungs: CTAB no crackles, wheeze, rhonchi Ext: no edema Skin: warm, dry  Neuro: CN II-XII intact, sensation and reflexes normal throughout, 5/5 muscle strength  in bilateral upper and lower extremities- perhaps 5-/5 on the right. Normal finger to nose. Normal rapid alternating movements. No pronator drift. Normal romberg.  Antalgic gait-patient very cautious after turning his head and appears imbalanced Positive Dix-Hallpike Joint changes such as ulnar deviation noted on hands from rheumatoid arthritis    Assessment and Plan   #Medications- stopped albuterol, B12, Prilosec, Zofran, rosuvastatin 10 mg, thiamine, cimzia for rheumatoid arthritis, folic acid, oxybutynin, trazodone (but not sleeping  # Vertigo S:patient reports when he wakes up in the morning he is dizzy (when pressed on this states more of a spinning sensation) when he gets up. Also if rolls over in bed may get dizzy. Once in seated position has to wait and be cautious about standing because may feel the same- has to hold on to  something at times.  No new tinnitus. No new hearing loss.  -using walker at all times as feels uneasy - a few months of symptoms A/P: this appears to be benign paroxysmal positional vertigo (BPPV) with positive dix hallpike today - we will refer to physical therapy vestibular rehab.  With his alcoholism I am hesitant to prescribe meclizine-wife does not think he would be able to stop  # B12 deficiency S: Current treatment/medication (oral vs. IM): He has stopped his B12 Lab Results  Component Value Date   VITAMINB12 1,185 (H) 11/07/2019  A/P: Patient agrees to restart B12-will recheck B12 at follow-up   #Alcoholism- November 03, 2019-major fall with head trauma/skull fracture/subarachnoid hemorrhage.  Has improved with cessation but he gradually increased alcohol back to 3 a day by 2023 and today he reports about 2 a day-encouraged full cessation especially with alcoholic liver disease previously noted-check LFTs at follow-up -Encouraged to restart thiamine  #Hyperlipidemia/aortic atherosclerosis #history of stroke 2023  S: CAD on lung cancer screening x 2 vessels.  -Aspirin 81 mg and rosuvastatin 10 mg daily have been advised the patient has stopped these medications since last visit A/P: Lipids likely very poorly controlled-restart rosuvastatin 10 mg daily.  Aortic atherosclerosis noted-need LDL under 70 and we will recheck that at next visit-also the goal for history of stroke and coronary artery calcium findings.  # History of subarachnoid hemorrhage- related to TBI/skull fracture previously after fall-with later stroke he was started on aspirin  # Tobacco abuse smoking a pack a day-encouraged full cessation  # Emphysema-wife reports intermittent wheezing-restart albuterol as needed and anticipate he may need more.  Mobility is limited by untreated rheumatoid arthritis-his decision has been to stop treatment  # Rheumatoid arthritis untreated-patient declines treatment or follow-up with  rheumatology at this time-she does not feel like treatments have made a big difference and he prefers just to tolerate  # Right hemiparesis-very mild 5 months out of 5 on the right-continue to monitor  Recommended follow up: Return in about 6 weeks (around 08/12/2023) for followup or sooner if needed.Schedule b4 you leave. Future Appointments  Date Time Provider Department Center  07/06/2023  3:00 PM [facility] ANNUAL WELLNESS VISIT 1 [facility]    Lab/Order associations:   ICD-10-CM   1. BPPV (benign paroxysmal positional vertigo), right  H81.11 Ambulatory referral to Physical Therapy    2. Alcoholism (HCC)  F10.20     3. Pulmonary emphysema, unspecified emphysema type (HCC)  J43.9     4. Mixed hyperlipidemia  E78.2     5. Aortic atherosclerosis (HCC)  I70.0     6. Rheumatoid arthritis, involving unspecified site, unspecified whether rheumatoid factor present (HCC)  Chronic M06.9     7. Right hemiparesis (HCC) Chronic G81.91       Meds ordered this encounter  Medications   albuterol (VENTOLIN HFA) 108 (90 Base) MCG/ACT inhaler    Sig: TAKE 2 PUFFS BY MOUTH EVERY 6 HOURS AS NEEDED FOR WHEEZE OR SHORTNESS OF BREATH    Dispense:  17 g    Refill:  1   cyanocobalamin (VITAMIN B12) 1000 MCG tablet    Sig: Take 1 tablet (1,000 mcg total) by mouth daily.    Dispense:  90 tablet    Refill:  3   thiamine (VITAMIN B-1) 100 MG tablet    Sig: Take 1 tablet (100 mg total) by mouth daily.    Dispense:  90 tablet    Refill:  3   rosuvastatin (CRESTOR) 10 MG tablet    Sig: Take 1 tablet (10 mg total) by mouth daily.    Dispense:  90 tablet    Refill:  3   aspirin EC 81 MG tablet    Sig: Take 1 tablet (81 mg total) by mouth daily. Swallow whole.    Dispense:  90 tablet    Refill:  3    Return precautions advised.  Tana Conch, MD

## 2023-07-01 NOTE — Patient Instructions (Addendum)
 this appears to be benign paroxysmal positional vertigo (BPPV) with positive dix hallpike today - we will refer to physical therapy vestibular rehab  We have placed a referral for you today for vestibular rehab through physical therapy - please call their # if you do not hear within a week (may be listed below or you may see mychart message within a few days with #).   Restart medicines sent in today- see me back in 6 weeks- new or worsening symptoms see me sooner  As always- quitting smoking and drinking are two of the best things you can do for your health  Recommended follow up: Return in about 6 weeks (around 08/12/2023) for followup or sooner if needed.Schedule b4 you leave.

## 2023-07-06 ENCOUNTER — Ambulatory Visit

## 2023-07-07 ENCOUNTER — Ambulatory Visit: Attending: Family Medicine | Admitting: Physical Therapy

## 2023-07-07 NOTE — Therapy (Deleted)
 OUTPATIENT PHYSICAL THERAPY VESTIBULAR EVALUATION     Patient Name: William Gibson MRN: [id number] DOB:30-Sep-1945, 78 y.o., male Today's Date: 07/07/2023  END OF SESSION:   Past Medical History:  Diagnosis Date   Abnormal liver function tests 12/15/2018   Alcoholic liver disease (HCC) 12/15/2018   Alcoholism (HCC)    Elevated LDL cholesterol level 05/22/2013   Epistaxis    Erectile dysfunction 05/22/2013   Frequent falls 11/09/2018   GERD (gastroesophageal reflux disease)    Hyperammonemia (HCC) 11/15/2018   Memory change 08/17/2017   RA (rheumatoid arthritis) (HCC) 08/17/2017   RBBB 12/15/2018   Rheumatoid arthritis (HCC) 2010   Shuffling gait 12/19/2018   Tobacco abuse 04/26/2012   Weight loss, unintentional 11/09/2018   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 12/15/2018   Past Surgical History:  Procedure Laterality Date   NASAL ENDOSCOPY WITH EPISTAXIS CONTROL N/A 04/12/2018   Procedure: NASAL ENDOSCOPY WITH EPISTAXIS CONTROL;  Surgeon: Suzanna Obey, MD;  Location: [facility];  Service: ENT;  Laterality: N/A;   VASECTOMY     VASECTOMY REVERSAL     Patient Active Problem List   Diagnosis Date Noted   Right hemiparesis (HCC) 10/21/2021   Plantar fasciitis 05/20/2021   History of stroke 05/16/2021   Aortic atherosclerosis (HCC) 06/03/2020   Emphysema lung (HCC) 06/03/2020   History of pancreatitis 01/12/2020   Major depression in full remission (HCC) 01/12/2020   Insomnia 01/12/2020   Chronic post-traumatic headache 12/13/2019   Vascular headache    Hyponatremia    History of subarachnoid hemorrhage 11/04/2019   Head injury 11/04/2019   Hyperlipidemia 02/06/2019   Shuffling gait 12/19/2018   Alcoholic liver disease (HCC) 12/15/2018   RBBB 12/15/2018   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 12/15/2018   Hyperammonemia (HCC) 11/15/2018   Frequent falls 11/09/2018   RA (rheumatoid arthritis) (HCC) 08/17/2017   Memory change 08/17/2017   Alcohol dependence in  early full remission (HCC) 12/15/2016   Osteoarthritis 01/30/2016   GERD (gastroesophageal reflux disease) 05/23/2014   Erectile dysfunction 05/22/2013   Tobacco abuse 04/26/2012    PCP: Shelva Majestic, MD REFERRING PROVIDER: Shelva Majestic, MD  REFERRING DIAG: H81.11 (ICD-10-CM) - BPPV (benign paroxysmal positional vertigo), right  THERAPY DIAG:  No diagnosis found.  ONSET DATE: ***  Rationale for Evaluation and Treatment: Rehabilitation  SUBJECTIVE:   SUBJECTIVE STATEMENT: *** Pt accompanied by: {accompnied:27141}  PERTINENT HISTORY: ***  PAIN:  Are you having pain? {OPRCPAIN:27236}  PRECAUTIONS: {Therapy precautions:24002}  RED FLAGS: {PT Red Flags:29287}   WEIGHT BEARING RESTRICTIONS: {Yes ***/No:24003}  FALLS: Has patient fallen in last 6 months? {fallsyesno:27318}  LIVING ENVIRONMENT: Lives with: {OPRC lives with:25569::"lives with their family"} Lives in: {Lives in:25570} Stairs: {opstairs:27293} Has following equipment at home: {Assistive devices:23999}  PLOF: {PLOF:24004}  PATIENT GOALS: ***  OBJECTIVE:  Note: Objective measures were completed at Evaluation unless otherwise noted.  DIAGNOSTIC FINDINGS: ***  COGNITION: Overall cognitive status: {cognition:24006}   SENSATION: {sensation:27233}  EDEMA:  {edema:24020}  MUSCLE TONE:  {LE tone:25568}  DTRs:  {DTR SITE:24025}  POSTURE:  {posture:25561}  Cervical ROM:    {AROM/PROM:27142} A/PROM (deg) eval  Flexion   Extension   Right lateral flexion   Left lateral flexion   Right rotation   Left rotation   (Blank rows = not tested)  STRENGTH: ***  LOWER EXTREMITY MMT:   MMT Right eval Left eval  Hip flexion    Hip abduction    Hip adduction  Hip internal rotation    Hip external rotation    Knee flexion    Knee extension    Ankle dorsiflexion    Ankle plantarflexion    Ankle inversion    Ankle eversion    (Blank rows = not tested)  BED MOBILITY:  {Bed  mobility:24027}  TRANSFERS: Assistive device utilized: {Assistive devices:23999}  Sit to stand: {Levels of assistance:24026} Stand to sit: {Levels of assistance:24026} Chair to chair: {Levels of assistance:24026} Floor: {Levels of assistance:24026}  RAMP: {Levels of assistance:24026}  CURB: {Levels of assistance:24026}  GAIT: Gait pattern: {gait characteristics:25376} Distance walked: *** Assistive device utilized: {Assistive devices:23999} Level of assistance: {Levels of assistance:24026} Comments: ***  FUNCTIONAL TESTS:  {Functional tests:24029}  PATIENT SURVEYS:  {rehab surveys:24030}  VESTIBULAR ASSESSMENT:  GENERAL OBSERVATION: ***   SYMPTOM BEHAVIOR:  Subjective history: ***  Non-Vestibular symptoms: {nonvestibular symptoms:25260}  Type of dizziness: {Type of Dizziness:25255}  Frequency: ***  Duration: ***  Aggravating factors: {Aggravating Factors:25258}  Relieving factors: {Relieving Factors:25259}  Progression of symptoms: {DESC; BETTER/WORSE:18575}  OCULOMOTOR EXAM:  Ocular Alignment: {Ocular Alignment:25262}  Ocular ROM: {RANGE OF MOTION:21649}  Spontaneous Nystagmus: {Spontaneous nystagmus:25263}  Gaze-Induced Nystagmus: {gaze-induced nystagmus:25264}  Smooth Pursuits: {smooth pursuit:25265}  Saccades: {saccades:25266}  Convergence/Divergence: *** cm   FRENZEL - FIXATION SUPRESSED:  Ocular Alignment: {Ocular Alignment:25262}  Spontaneous Nystagmus: {Spontaneous nystagmus:25263}  Gaze-Induced Nystagmus: {gaze-induced nystagmus:25264}  Horizontal head shaking - induced nystagmus: {head shaking induced nystagmus:25267}  Vertical head shaking - induced nystagmus: {head shaking induced nystagmus:25267}  Positional tests: {Positional tests:25271}  Pressure tests: {frenzel pressure tests:25268}  VESTIBULAR - OCULAR REFLEX:   Slow VOR: {slow VOR:25290}  VOR Cancellation: {vor cancellation:25291}  Head-Impulse Test: {head impulse test:25272}  Dynamic  Visual Acuity: {dynamic visual acuity:25273}   POSITIONAL TESTING: {Positional tests:25271}  MOTION SENSITIVITY:  Motion Sensitivity Quotient Intensity: 0 = none, 1 = Lightheaded, 2 = Mild, 3 = Moderate, 4 = Severe, 5 = Vomiting  Intensity  1. Sitting to supine   2. Supine to L side   3. Supine to R side   4. Supine to sitting   5. L Hallpike-Dix   6. Up from L    7. R Hallpike-Dix   8. Up from R    9. Sitting, head tipped to L knee   10. Head up from L knee   11. Sitting, head tipped to R knee   12. Head up from R knee   13. Sitting head turns x5   14.Sitting head nods x5   15. In stance, 180 turn to L    16. In stance, 180 turn to R     OTHOSTATICS: {Exam; orthostatics:31331}  FUNCTIONAL GAIT: {Functional tests:24029}                                                                                                                             TREATMENT DATE: ***   Canalith Repositioning:  {Canalith Repositioning:25283} Gaze Adaptation:  {gaze adaptation:25286} Habituation:  {habituation:25288}  Other: ***  PATIENT EDUCATION: Education details: *** Person educated: {Person educated:25204} Education method: {Education Method:25205} Education comprehension: {Education Comprehension:25206}  HOME EXERCISE PROGRAM:  GOALS: Goals reviewed with patient? {yes/no:20286}  SHORT TERM GOALS: Target date: ***  *** Baseline: Goal status: {GOALSTATUS:25110}  2.  *** Baseline:  Goal status: {GOALSTATUS:25110}  3.  *** Baseline:  Goal status: {GOALSTATUS:25110}  4.  *** Baseline:  Goal status: {GOALSTATUS:25110}  5.  *** Baseline:  Goal status: {GOALSTATUS:25110}  6.  *** Baseline:  Goal status: {GOALSTATUS:25110}  LONG TERM GOALS: Target date: ***  *** Baseline:  Goal status: {GOALSTATUS:25110}  2.  *** Baseline:  Goal status: {GOALSTATUS:25110}  3.  *** Baseline:  Goal status: {GOALSTATUS:25110}  4.  *** Baseline:  Goal status:  {GOALSTATUS:25110}  5.  *** Baseline:  Goal status: {GOALSTATUS:25110}  6.  *** Baseline:  Goal status: {GOALSTATUS:25110}  ASSESSMENT:  CLINICAL IMPRESSION: Patient is a *** y.o. *** who was seen today for physical therapy evaluation and treatment for ***.   OBJECTIVE IMPAIRMENTS: {opptimpairments:25111}.   ACTIVITY LIMITATIONS: {activitylimitations:27494}  PARTICIPATION LIMITATIONS: {participationrestrictions:25113}  PERSONAL FACTORS: {Personal factors:25162} are also affecting patient's functional outcome.   REHAB POTENTIAL: {rehabpotential:25112}  CLINICAL DECISION MAKING: {clinical decision making:25114}  EVALUATION COMPLEXITY: {Evaluation complexity:25115}   PLAN:  PT FREQUENCY: {rehab frequency:25116}  PT DURATION: {rehab duration:25117}  PLANNED INTERVENTIONS: {rehab planned interventions:25118::"97110-Therapeutic exercises","97530- Therapeutic [phone]- Neuromuscular re-education","97535- Self QMVH","84696- Manual therapy"}  PLAN FOR NEXT SESSION: ***   Jazzmyne Rasnick April Ma L Sarye Kath, PT 07/07/2023, 12:09 PM

## 2023-07-08 ENCOUNTER — Ambulatory Visit: Payer: 59 | Admitting: Family Medicine

## 2023-07-16 ENCOUNTER — Ambulatory Visit

## 2023-11-09 ENCOUNTER — Telehealth: Payer: Self-pay | Admitting: Neurology

## 2023-11-09 NOTE — Telephone Encounter (Signed)
 Appointment made for 1 yr f/u

## 2023-11-10 ENCOUNTER — Ambulatory Visit (INDEPENDENT_AMBULATORY_CARE_PROVIDER_SITE_OTHER): Admitting: Neurology

## 2023-11-10 ENCOUNTER — Encounter: Payer: Self-pay | Admitting: Neurology

## 2023-11-10 VITALS — BP 126/76 | HR 94 | Resp 15 | Ht 68.0 in

## 2023-11-10 DIAGNOSIS — G8191 Hemiplegia, unspecified affecting right dominant side: Secondary | ICD-10-CM

## 2023-11-10 DIAGNOSIS — Z8673 Personal history of transient ischemic attack (TIA), and cerebral infarction without residual deficits: Secondary | ICD-10-CM

## 2023-11-10 DIAGNOSIS — I639 Cerebral infarction, unspecified: Secondary | ICD-10-CM | POA: Diagnosis not present

## 2023-11-10 DIAGNOSIS — S069X0S Unspecified intracranial injury without loss of consciousness, sequela: Secondary | ICD-10-CM

## 2023-11-10 DIAGNOSIS — R9082 White matter disease, unspecified: Secondary | ICD-10-CM

## 2023-11-10 DIAGNOSIS — I998 Other disorder of circulatory system: Secondary | ICD-10-CM

## 2023-11-10 DIAGNOSIS — R413 Other amnesia: Secondary | ICD-10-CM

## 2023-11-10 MED ORDER — LEVETIRACETAM 250 MG PO TABS: 250.0000 mg | ORAL_TABLET | Freq: Two times a day (BID) | ORAL | 5 refills | Status: DC

## 2023-11-10 NOTE — Progress Notes (Signed)
 Chief Complaint  Patient presents with   Memory Loss    Rm13, DAUGHTER PRESENT, Pt following up with TBI and memory loss: MOCA was unable to be obtained as patient is hard of hearing   Gait Problem    RM13, DAUGHTER PRESENT, PT USES WALKER FOR GAIT     Seizures    RM13, DAUGHTER PRESENT, pt daughter stated that the pt has been having sz episodes 3 times per week that include shaking and unawareness of what all happened    ASSESSMENT AND PLAN  William Gibson is a 78 y.o. male   1.  Acute left pontine stroke in December 2022 -Doing overall well, continue aspirin  81 mg daily -Discussed the importance of vascular risk factors for secondary stroke prevention BP less than 130/90, LDL less than 70, A1c less than 7.0 -Discussed PT for subjective leg weakness, wishes to hold off   2.  History of traumatic brain injury -Evidence of bifrontal, right temporal tip encephalomalacia, -At risk for partial seizure, concern for partial seizure with spells described as zoning out, stiffening of upper extremities with shaking, 3 times a week, often triggered by coughing but not always -Check EEG, check basic labs -Trial of Keppra  250 mg BID -If routine EEG is unremarkable, check ambulatory EEG   3.  Cognitive impairment -Overall stable, previous MOCA was 22/30, hard of hearing having hard time doing MOCA testing, no major change per family  Follow up with Dr. Onita in 4-6 months   Meds ordered this encounter  Medications   levETIRAcetam  (KEPPRA ) 250 MG tablet    Sig: Take 1 tablet (250 mg total) by mouth 2 (two) times daily.    Dispense:  60 tablet    Refill:  5   DIAGNOSTIC DATA (LABS, IMAGING, TESTING) - I reviewed patient records, labs, notes, testing and imaging myself where available. MRI brain, MRA neck and head in Jan 2023. Suspect subacute infarct of the parasagittal left pons.  Moderate chronic microvascular ischemic changes. Bifrontal and anterior temporal encephalomalacia. Chronic  right cerebellar infarct.  No large vessel occlusion or hemodynamically significant stenosis.     MEDICAL HISTORY:  ANTIONIO Gibson is a 77 year old male,, seen in request by his primary care physician Dr. Katrinka, [place]ette for evaluation of memory loss, he is accompanied by his daughter at today's visit on May 16, 2021, the other daughter is connected through [employer],  I reviewed and summarized the referring note. PMHX. HLD Alcohol abuse Rheumatoid arthritis Smoke 1 ppd x60 years  Around Christmas time, he was noted to have sudden onset slurred speech, right arm and leg weakness, leading to MRI of the brain ordered by his primary care physician, I personally reviewed the film, positive DWI lesion at left parasagittal pons, periventricular moderate small vessel disease, right cerebellar chronic infarction.  In addition bifrontal right anterior temporal encephalomalacia, there is no evidence of large vessel disease.  Patient does report a history of traumatic brain injury in July 2021, fell backwards, landed on occipital region, with prolonged loss of consciousness,  He was not on antiplatelet agent, not taking aspirin  325 mg daily, cardiology appointment pending  Over the past few weeks, his slurred speech improved to baseline, improvement of right arm and leg weakness.  He also had a history of rheumatoid arthritis, multiple joints pain, under the care of rheumatologist, was previously treated with daily meloxicam , now stopped.  He was noted to have mild memory loss, MoCA 22/30 today, missed 5 out of 5 recall,  He used to drink a gallon of hard liquor a week, now he only drinks mild amount mixed with soft drinks, still smokes three-quarter pack a day  Update May 26, 2022 SS: Here with his daughter, Rosaline, lives with his wife, is trying to cut back on drinking, none in 1 week, 1 cigarette a day. On aspirin  81 mg daily. Very limited driving. Not very active, has been reading.  MOCA 22/30. Stable. No worsening forgetfulness or repetition. Still has urinary incontinence, urgency, frequency. PCP stopped oxybutynin . Was sleeping a lot, but arthritis, felt better lying in the bed. Doesn't want to do any PT. He thinks quitting drinking will help his bladder.  CT head in November 2023 was overall stable, ordered due to falls.  Update November 10, 2023 SS: Here with daughter, Rosaline. Hard time hearing today to do memory test, refuses hearing aids. Living at home with his wife. Describe seizure events zones out, shaking arms, confused when he comes around, often when he is coughing, but not always. Started a year ago, more frequent 3 times a week. Remains on aspirin  81 mg daily. Drinking 3 mixed alcohol drink a day, smokes a pack a day. Has generalized arthritis pain. A few drinks a day dulls the arthritis pain. He isn't driving.   PHYSICAL EXAM:   Vitals:   11/10/23 1041  BP: 126/76  Pulse: 94  Resp: 15  Height: 5' 8 (1.727 m)   Not recorded     Body mass index is 24.48 kg/m.  PHYSICAL EXAMNIATION:  Gen: NAD, conversant, well nourised, well groomed                      Cognition:         05/26/2022    2:19 PM 05/16/2021   11:00 AM  Montreal Cognitive Assessment   Visuospatial/ Executive (0/5) 3 4  Naming (0/3) 3 3  Attention: Read list of digits (0/2) 1 2  Attention: Read list of letters (0/1) 1 1  Attention: Serial 7 subtraction starting at 100 (0/3) 3 3  Language: Repeat phrase (0/2) 2 2  Language : Fluency (0/1) 0 0  Abstraction (0/2) 2 2  Delayed Recall (0/5) 2 0  Orientation (0/6) 5 5  Total 22 22     CRANIAL NERVES: CN II: Visual fields are full to confrontation. Pupils are round equal and briskly reactive to light. CN III, IV, VI: extraocular movement are normal. No ptosis. CN V: Facial sensation is intact to light touch CN VII: Face is symmetric with normal eye closure  CN VIII: Hearing is normal to causal conversation. CN IX, X: Phonation  is normal. CN XI: Head turning and shoulder shrug are intact  MOTOR: Muscle strength is normal.  REFLEXES: Reflexes are 2+ and symmetric at the biceps, triceps, knees, and ankles. Plantar responses are flexor.  SENSORY: Intact to light touch  COORDINATION: There is no trunk or limb dysmetria noted.  GAIT/STANCE: Uses walker in the hall, can walk short distances independent but is cautious   REVIEW OF SYSTEMS:  Full 14 system review of systems performed and notable only for as above All other review of systems were negative.   ALLERGIES: Allergies  Allergen Reactions   Adalimumab Rash    HOME MEDICATIONS: Current Outpatient Medications  Medication Sig Dispense Refill   acetaminophen  (TYLENOL ) 650 MG CR tablet Take 650 mg by mouth every 8 (eight) hours as needed for pain.     albuterol  (VENTOLIN  HFA) 108 (  90 Base) MCG/ACT inhaler TAKE 2 PUFFS BY MOUTH EVERY 6 HOURS AS NEEDED FOR WHEEZE OR SHORTNESS OF BREATH 17 g 1   aspirin  EC 81 MG tablet Take 1 tablet (81 mg total) by mouth daily. Swallow whole. 90 tablet 3   cyanocobalamin (VITAMIN B12) 1000 MCG tablet Take 1 tablet (1,000 mcg total) by mouth daily. 90 tablet 3   rosuvastatin  (CRESTOR ) 10 MG tablet Take 1 tablet (10 mg total) by mouth daily. 90 tablet 3   thiamine  (VITAMIN B-1) 100 MG tablet Take 1 tablet (100 mg total) by mouth daily. 90 tablet 3   No current facility-administered medications for this visit.    PAST MEDICAL HISTORY: Past Medical History:  Diagnosis Date   Abnormal liver function tests 12/15/2018   Alcoholic liver disease (HCC) 12/15/2018   Alcoholism (HCC)    Elevated LDL cholesterol level 05/22/2013   Epistaxis    Erectile dysfunction 05/22/2013   Frequent falls 11/09/2018   GERD (gastroesophageal reflux disease)    Hyperammonemia (HCC) 11/15/2018   Memory change 08/17/2017   RA (rheumatoid arthritis) (HCC) 08/17/2017   RBBB 12/15/2018   Rheumatoid arthritis (HCC) 2010   Shuffling gait 12/19/2018    Tobacco abuse 04/26/2012   Weight loss, unintentional 11/09/2018   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 12/15/2018    PAST SURGICAL HISTORY: Past Surgical History:  Procedure Laterality Date   NASAL ENDOSCOPY WITH EPISTAXIS CONTROL N/A 04/12/2018   Procedure: NASAL ENDOSCOPY WITH EPISTAXIS CONTROL;  Surgeon: Roark Rush, MD;  Location: [facility];  Service: ENT;  Laterality: N/A;   VASECTOMY     VASECTOMY REVERSAL      FAMILY HISTORY: Family History  Problem Relation Age of Onset   Heart disease Mother    Lung cancer Father    Heart attack Father    CAD Brother    Aneurysm Brother    Colon cancer Neg Hx    Esophageal cancer Neg Hx    Stomach cancer Neg Hx     SOCIAL HISTORY: Social History   Socioeconomic History   Marital status: Married    Spouse name: Not on file   Number of children: Not on file   Years of education: Not on file   Highest education level: Not on file  Occupational History   Occupation: retired  Tobacco Use   Smoking status: Every Day    Current packs/day: 1.50    Average packs/day: 1.5 packs/day for 58.0 years (87.0 ttl pk-yrs)    Types: Cigarettes   Smokeless tobacco: Never   Tobacco comments:    Pt states he smokes 3/4 of a  ppd   Vaping Use   Vaping status: Never Used  Substance and Sexual Activity   Alcohol use: Not Currently    Comment: 1/5 whiskey a day prior; none in last two weeks as of 05/16/21   Drug use: No   Sexual activity: Not Currently  Other Topics Concern   Not on file  Social History Narrative   Married. 3 children. 8 grandkids. 1 greatgrandkid.       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   Desires CPR, does not want prolonged life support if futile.         05/16/2021   Caffeine: a little bit, some days none, sometimes 1 cup   Social Drivers of Health   Financial Resource Strain: Low Risk  (05/08/2022)  Overall Financial Resource Strain (CARDIA)     Difficulty of Paying Living Expenses: Not hard at all  Food Insecurity: No Food Insecurity (05/08/2022)   Hunger Vital Sign    Worried About Running Out of Food in the Last Year: Never true    Ran Out of Food in the Last Year: Never true  Transportation Needs: No Transportation Needs (05/08/2022)   PRAPARE - Administrator, Civil Service (Medical): No    Lack of Transportation (Non-Medical): No  Physical Activity: Sufficiently Active (05/08/2022)   Exercise Vital Sign    Days of Exercise per Week: 3 days    Minutes of Exercise per Session: 60 min  Stress: No Stress Concern Present (05/08/2022)   [employer] of Occupational Health - Occupational Stress Questionnaire    Feeling of Stress : Not at all  Social Connections: Moderately Isolated (05/08/2022)   Social Connection and Isolation Panel    Frequency of Communication with Friends and Family: Twice a week    Frequency of Social Gatherings with Friends and Family: Twice a week    Attends Religious Services: Never    Database administrator or Organizations: No    Attends Banker Meetings: Never    Marital Status: Married  Catering manager Violence: Not At Risk (05/08/2022)   Humiliation, Afraid, Rape, and Kick questionnaire    Fear of Current or Ex-Partner: No    Emotionally Abused: No    Physically Abused: No    Sexually Abused: No   Lauraine Born, SCHARLENE, DNP  [facility] [address], [address] [place], [place] [address] [phone]

## 2023-11-10 NOTE — Patient Instructions (Addendum)
 Try Keppra  250 mg twice daily for seizure prevention Check labs today  Document spells Check EEG Follow up in 4-6 months with Dr. Onita

## 2023-11-11 ENCOUNTER — Ambulatory Visit: Payer: Self-pay | Admitting: Neurology

## 2023-11-11 ENCOUNTER — Ambulatory Visit: Admitting: Family

## 2023-11-11 LAB — CBC WITH DIFFERENTIAL/PLATELET
Basophils Absolute: 0.1 x10E3/uL (ref 0.0–0.2)
Basos: 1 %
EOS (ABSOLUTE): 0.3 x10E3/uL (ref 0.0–0.4)
Eos: 4 %
Hematocrit: 44.1 % (ref 37.5–51.0)
Hemoglobin: 14.6 g/dL (ref 13.0–17.7)
Immature Grans (Abs): 0 x10E3/uL (ref 0.0–0.1)
Immature Granulocytes: 0 %
Lymphocytes Absolute: 1.7 x10E3/uL (ref 0.7–3.1)
Lymphs: 22 %
MCH: 31.7 pg (ref 26.6–33.0)
MCHC: 33.1 g/dL (ref 31.5–35.7)
MCV: 96 fL (ref 79–97)
Monocytes Absolute: 0.8 x10E3/uL (ref 0.1–0.9)
Monocytes: 11 %
Neutrophils Absolute: 4.7 x10E3/uL (ref 1.4–7.0)
Neutrophils: 62 %
Platelets: 211 x10E3/uL (ref 150–450)
RBC: 4.6 x10E6/uL (ref 4.14–5.80)
RDW: 11.8 % (ref 11.6–15.4)
WBC: 7.6 x10E3/uL (ref 3.4–10.8)

## 2023-11-11 LAB — COMPREHENSIVE METABOLIC PANEL WITH GFR
ALT: 12 IU/L (ref 0–44)
AST: 21 IU/L (ref 0–40)
Albumin: 4.1 g/dL (ref 3.8–4.8)
Alkaline Phosphatase: 88 IU/L (ref 44–121)
BUN/Creatinine Ratio: 20 (ref 10–24)
BUN: 17 mg/dL (ref 8–27)
Bilirubin Total: 0.4 mg/dL (ref 0.0–1.2)
CO2: 21 mmol/L (ref 20–29)
Calcium: 9.5 mg/dL (ref 8.6–10.2)
Chloride: 102 mmol/L (ref 96–106)
Creatinine, Ser: 0.86 mg/dL (ref 0.76–1.27)
Globulin, Total: 4.2 g/dL (ref 1.5–4.5)
Glucose: 101 mg/dL — ABNORMAL HIGH (ref 70–99)
Potassium: 4.8 mmol/L (ref 3.5–5.2)
Sodium: 140 mmol/L (ref 134–144)
Total Protein: 8.3 g/dL (ref 6.0–8.5)
eGFR: 89 mL/min/1.73 (ref >59)

## 2023-11-15 ENCOUNTER — Telehealth: Payer: Self-pay | Admitting: Neurology

## 2023-11-15 ENCOUNTER — Other Ambulatory Visit: Admitting: *Deleted

## 2023-11-15 ENCOUNTER — Encounter: Payer: Self-pay | Admitting: Neurology

## 2023-11-15 NOTE — Telephone Encounter (Signed)
 Patient's wife called to cancel appointment due household have a virus with vomiting and diarrhea.

## 2023-11-24 ENCOUNTER — Ambulatory Visit: Admitting: Family

## 2023-12-02 ENCOUNTER — Encounter: Payer: Self-pay | Admitting: Family

## 2023-12-02 ENCOUNTER — Ambulatory Visit (INDEPENDENT_AMBULATORY_CARE_PROVIDER_SITE_OTHER): Admitting: Family

## 2023-12-02 VITALS — BP 120/76 | HR 70 | Temp 98.1°F | Ht 68.0 in | Wt 157.0 lb

## 2023-12-02 DIAGNOSIS — F17200 Nicotine dependence, unspecified, uncomplicated: Secondary | ICD-10-CM

## 2023-12-02 DIAGNOSIS — J439 Emphysema, unspecified: Secondary | ICD-10-CM

## 2023-12-02 DIAGNOSIS — R058 Other specified cough: Secondary | ICD-10-CM | POA: Diagnosis not present

## 2023-12-02 NOTE — Progress Notes (Signed)
 Patient ID: MERCURY ROCK, male    DOB: Oct 13, 1945, 78 y.o.   MRN: [id number]  Chief Complaint  Patient presents with   Seizures    Pt c/o seizure when coughing, present for 1 year. Pt c/o blacking out. Last episode was last week. Pt is currently seeing neuro, prescribed keppra . Schedule for EEG next week.   Discussed the use of AI scribe software for clinical note transcription with the patient, who gave verbal consent to proceed.  History of Present Illness Kaan Tosh is a 78 year old male who presents with his daughter and having episodes of coughing and shaking.  Paroxysmal cough and associated neurological symptoms - Episodes of uncontrollable coughing followed by shaking and occasional blackouts - Episodes are infrequent and primarily triggered by coughing - Occasional loss of awareness during episodes - Episodes have led to falls - No episodes since starting Keppra  last week - Initially took Keppra  once daily instead of prescribed twice daily dosing - No side effects from Keppra  - EEG scheduled for next week  Chronic cough, Emphysema and tobacco use - Dry cough relieved by albuterol  inhaler as needed - History of smoking, currently reduced by more than half - Attempting to quit smoking using patches and lozenges at home - No use of maintenance inhalers with steroids - No evaluation by a lung specialist - Previous lung cancer screening completed  Assessment & Plan Cough with episodic shaking and transient loss of awareness Episodes of coughing followed by shaking and transient loss of awareness. Neurologist prescribed Keppra , recently initiated, with no episodes since. Differential includes seizure versus hypoxia due to excessive coughing. EEG scheduled for further evaluation. - Proceed with EEG as scheduled. - Follow up with neurology for further evaluation.  Chronic obstructive pulmonary disease (COPD) Chronic cough likely related to COPD, exacerbated by  smoking. Has not ever used an inhaler before. Discussed potential need for maintenance inhaler with steroid if albuterol  helps sx and need it daily. Lungs clear on exam. - Use albuterol  inhaler as needed, consider regular use in the morning and evening. - Discuss with PCP about starting a maintenance inhaler with steroid if symptoms persist.  Tobacco use disorder Long-term smoker with recent reduction in smoking. Motivated to quit due to cost and health concerns. Discussed nicotine  replacement and generic Chantix , but prefers to quit without pharmacological aids. - Encourage continued reduction and cessation of smoking. - Consider nicotine  replacement therapy if needed, although he prefers to quit without it.   Subjective:    Outpatient Medications Prior to Visit  Medication Sig Dispense Refill   acetaminophen  (TYLENOL ) 650 MG CR tablet Take 650 mg by mouth every 8 (eight) hours as needed for pain.     albuterol  (VENTOLIN  HFA) 108 (90 Base) MCG/ACT inhaler TAKE 2 PUFFS BY MOUTH EVERY 6 HOURS AS NEEDED FOR WHEEZE OR SHORTNESS OF BREATH 17 g 1   aspirin  EC 81 MG tablet Take 1 tablet (81 mg total) by mouth daily. Swallow whole. 90 tablet 3   cyanocobalamin (VITAMIN B12) 1000 MCG tablet Take 1 tablet (1,000 mcg total) by mouth daily. 90 tablet 3   levETIRAcetam  (KEPPRA ) 250 MG tablet Take 1 tablet (250 mg total) by mouth 2 (two) times daily. 60 tablet 5   rosuvastatin  (CRESTOR ) 10 MG tablet Take 1 tablet (10 mg total) by mouth daily. 90 tablet 3   thiamine  (VITAMIN B-1) 100 MG tablet Take 1 tablet (100 mg total) by mouth daily. 90 tablet 3   No  facility-administered medications prior to visit.   Past Medical History:  Diagnosis Date   Abnormal liver function tests 12/15/2018   Alcoholic liver disease (HCC) 12/15/2018   Alcoholism (HCC)    Elevated LDL cholesterol level 05/22/2013   Epistaxis    Erectile dysfunction 05/22/2013   Frequent falls 11/09/2018   GERD (gastroesophageal reflux  disease)    Hyperammonemia (HCC) 11/15/2018   Memory change 08/17/2017   RA (rheumatoid arthritis) (HCC) 08/17/2017   RBBB 12/15/2018   Rheumatoid arthritis (HCC) 2010   Shuffling gait 12/19/2018   Tobacco abuse 04/26/2012   Weight loss, unintentional 11/09/2018   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 12/15/2018   Past Surgical History:  Procedure Laterality Date   NASAL ENDOSCOPY WITH EPISTAXIS CONTROL N/A 04/12/2018   Procedure: NASAL ENDOSCOPY WITH EPISTAXIS CONTROL;  Surgeon: Roark Rush, MD;  Location: [facility];  Service: ENT;  Laterality: N/A;   VASECTOMY     VASECTOMY REVERSAL     Allergies  Allergen Reactions   Adalimumab Rash      Objective:    Physical Exam Vitals and nursing note reviewed.  Constitutional:      General: He is not in acute distress.    Appearance: Normal appearance.  HENT:     Head: Normocephalic.  Cardiovascular:     Rate and Rhythm: Normal rate and regular rhythm.  Pulmonary:     Effort: Pulmonary effort is normal.     Breath sounds: Normal breath sounds.  Musculoskeletal:        General: Normal range of motion.     Cervical back: Normal range of motion.  Skin:    General: Skin is warm and dry.  Neurological:     Mental Status: He is alert and oriented to person, place, and time.  Psychiatric:        Mood and Affect: Mood normal.    BP 120/76 (BP Location: Right Arm, Patient Position: Sitting, Cuff Size: Normal)   Pulse 70   Temp 98.1 F (36.7 C) (Temporal)   Ht 5' 8 (1.727 m)   Wt 157 lb (71.2 kg)   SpO2 94%   BMI 23.87 kg/m  Wt Readings from Last 3 Encounters:  12/02/23 157 lb (71.2 kg)  07/01/23 161 lb (73 kg)  07/14/22 159 lb 12.8 oz (72.5 kg)      Lucius Krabbe, NP

## 2023-12-13 ENCOUNTER — Ambulatory Visit (INDEPENDENT_AMBULATORY_CARE_PROVIDER_SITE_OTHER): Admitting: Neurology

## 2023-12-13 DIAGNOSIS — R4182 Altered mental status, unspecified: Secondary | ICD-10-CM

## 2023-12-13 DIAGNOSIS — I639 Cerebral infarction, unspecified: Secondary | ICD-10-CM

## 2023-12-13 DIAGNOSIS — S069X0S Unspecified intracranial injury without loss of consciousness, sequela: Secondary | ICD-10-CM

## 2023-12-26 NOTE — Procedures (Signed)
   HISTORY: 78 year old male, with traumatic brain injury, mild cognitive impairment  TECHNIQUE:  This is a routine 16 channel EEG recording with one channel devoted to a limited EKG recording.  It was performed during wakefulness, drowsiness and asleep.  Hyperventilation and photic stimulation were performed as activating procedures.  There are minimum muscle and movement artifact noted.  Upon maximum arousal, posterior dominant waking rhythm consistent of rhythmic alpha range activity. Activities are symmetric over the bilateral posterior derivations and attenuated with eye opening.  Photic stimulation did not alter the tracing.  Hyperventilation produced mild/moderate buildup with higher amplitude and the slower activities noted.  During EEG recording, patient developed drowsiness and no deeper stage of sleep was achieved  During EEG recording, there was no epileptiform discharge noted.  EKG demonstrate normal sinus rhythm.  CONCLUSION: This is a  normal awake EEG.  There is no electrodiagnostic evidence of epileptiform discharge.  Daelan Gatt, M.D. Ph.D.  [facility] [address] [place], [place] [address] Phone: [phone] Fax:      [phone]

## 2023-12-27 ENCOUNTER — Encounter: Payer: Self-pay | Admitting: Neurology

## 2023-12-27 DIAGNOSIS — R9082 White matter disease, unspecified: Secondary | ICD-10-CM

## 2023-12-27 DIAGNOSIS — I639 Cerebral infarction, unspecified: Secondary | ICD-10-CM

## 2023-12-27 DIAGNOSIS — G8191 Hemiplegia, unspecified affecting right dominant side: Secondary | ICD-10-CM

## 2023-12-27 DIAGNOSIS — Z8673 Personal history of transient ischemic attack (TIA), and cerebral infarction without residual deficits: Secondary | ICD-10-CM

## 2023-12-27 DIAGNOSIS — S069X0S Unspecified intracranial injury without loss of consciousness, sequela: Secondary | ICD-10-CM

## 2023-12-30 ENCOUNTER — Other Ambulatory Visit: Payer: Self-pay | Admitting: Family Medicine

## 2024-01-05 ENCOUNTER — Ambulatory Visit: Admitting: Family Medicine

## 2024-01-06 ENCOUNTER — Ambulatory Visit (INDEPENDENT_AMBULATORY_CARE_PROVIDER_SITE_OTHER)

## 2024-01-06 ENCOUNTER — Ambulatory Visit (INDEPENDENT_AMBULATORY_CARE_PROVIDER_SITE_OTHER): Admitting: Family Medicine

## 2024-01-06 VITALS — BP 108/68 | HR 90 | Temp 98.1°F | Ht 68.0 in | Wt 159.0 lb

## 2024-01-06 DIAGNOSIS — J439 Emphysema, unspecified: Secondary | ICD-10-CM

## 2024-01-06 DIAGNOSIS — R3915 Urgency of urination: Secondary | ICD-10-CM | POA: Diagnosis not present

## 2024-01-06 MED ORDER — TRELEGY ELLIPTA 100-62.5-25 MCG/ACT IN AEPB: 1.0000 | INHALATION_SPRAY | Freq: Every day | RESPIRATORY_TRACT | 11 refills | Status: AC

## 2024-01-06 MED ORDER — PREDNISONE 50 MG PO TABS: ORAL_TABLET | ORAL | 0 refills | Status: DC

## 2024-01-06 MED ORDER — AZITHROMYCIN 250 MG PO TABS: ORAL_TABLET | ORAL | 0 refills | Status: DC

## 2024-01-06 MED ORDER — MIRABEGRON ER 25 MG PO TB24: 25.0000 mg | ORAL_TABLET | Freq: Every day | ORAL | 0 refills | Status: DC

## 2024-01-06 NOTE — Patient Instructions (Signed)
 It was very nice to see you today!  VISIT SUMMARY: During your visit, we discussed your chronic cough, episodes of syncope, and urinary urgency. We have made some changes to your medications and recommended further tests to better understand your symptoms.  YOUR PLAN: CHRONIC OBSTRUCTIVE PULMONARY DISEASE (COPD) WITH CHRONIC COUGH AND WHEEZING: You have a chronic cough and wheezing, likely due to COPD, which has recently worsened. This may also be aggravated by sinus drainage. -Start using Trelegy inhaler, one puff daily, as maintenance. -Continue using your albuterol  inhaler as needed for acute symptoms. -Start the prednisone  and zpack -A chest x-ray has been ordered to rule out pneumonia or other lung issues. -You are referred to a pulmonologist for further evaluation and pulmonary function testing.  OVERACTIVE BLADDER WITH URINARY URGENCY: You have sudden and urgent needs to urinate and defecate, which may be due to an overactive bladder. -Start taking Myrbetriq  as prescribed for overactive bladder symptoms. -Report back in a week via MyChart on the effectiveness of Myrbetriq .  Return if symptoms worsen or fail to improve.   Take care, Dr Kennyth  PLEASE NOTE:  If you had any lab tests, please let us  know if you have not heard back within a few days. You may see your results on mychart before we have a chance to review them but we will give you a call once they are reviewed by us .   If we ordered any referrals today, please let us  know if you have not heard from their office within the next week.   If you had any urgent prescriptions sent in today, please check with the pharmacy within an hour of our visit to make sure the prescription was transmitted appropriately.   Please try these tips to maintain a healthy lifestyle:  Eat at least 3 REAL meals and 1-2 snacks per day.  Aim for no more than 5 hours between eating.  If you eat breakfast, please do so within one hour of getting up.    Each meal should contain half fruits/vegetables, one quarter protein, and one quarter carbs (no bigger than a computer mouse)  Cut down on sweet beverages. This includes juice, soda, and sweet tea.   Drink at least 1 glass of water with each meal and aim for at least 8 glasses per day  Exercise at least 150 minutes every week.

## 2024-01-06 NOTE — Telephone Encounter (Signed)
 SABRA

## 2024-01-06 NOTE — Progress Notes (Signed)
   William Gibson is a 78 y.o. male who presents today for an office visit.  Assessment/Plan:  COPD No red flags though patient does have modestly increased work of breathing today and is likely dealing with an acute COPD flare though does not look like he has been officially diagnosed with this in the past.  Satting 94% on room air during today's exam.  Will treat his acute flare with prednisone  and azithromycin .  He will continue his albuterol .  Will add on Trelegy for maintenance inhaler.  Also check chest x-ray and refer to pulmonology for PFTs and further evaluation.  They will follow-up with us  next week.  We discussed reasons to return to care sooner.  Urinary urgency Was previously diagnosed with overactive bladder.  We did discuss checking UA and urine culture today however patient and family would like to hold off on this for now.  Will start Myrbetriq .  They will follow-up with any other PCP next week.   Subjective:  HPI:  See assessment / plan for status of chronic conditions.    Discussed the use of AI scribe software for clinical note transcription with the patient, who gave verbal consent to proceed.  History of Present Illness William Gibson is a 78 year old male who presents with cough and wheeze  He has experienced a persistent cough for the past year, worsening over time. The cough occurs daily, is particularly severe at night, and disturbs his wife's sleep. During episodes, the coughing becomes uncontrollable, leading to difficulty breathing, shaking, and occasionally syncope. These episodes resolve within a few minutes, after which he regains awareness of his surroundings, but he reports his hands feel very tight.  He has tried albuterol  inhaler for this which gives modest improvement but only for short-term.   He experiences wheezing, particularly at night, which sometimes stops after coughing or changing positions. His history of arthritis limits his ability  to sleep on his back or either shoulder.  He has significant post-nasal drainage, which he believes contributes to his cough. He attempts to clear this by coughing but finds it difficult to expel the mucus.  He experiences sudden and urgent needs to urinate and defecate, ongoing for some time. He has had several urine tests in the past but does not recall being on any medication for these symptoms. He is concerned about leaving the house due to fear of not reaching a bathroom in time.         Objective:  Physical Exam: BP 108/68   Pulse 90   Temp 98.1 F (36.7 C) (Temporal)   Ht 5' 8 (1.727 m)   Wt 159 lb (72.1 kg)   SpO2 94%   BMI 24.18 kg/m   Gen: No acute distress, resting comfortably CV: Regular rate and rhythm with no murmurs appreciated Pulm: Modestly increased work of breathing.  Speaking in full sentences.  Bilateral wheezes and rhonchi noted throughout all lung fields. Neuro: Grossly normal, moves all extremities Psych: Normal affect and thought content      Deyton Ellenbecker M. Kennyth, MD 01/06/2024 2:53 PM

## 2024-01-07 ENCOUNTER — Ambulatory Visit: Payer: Self-pay | Admitting: Family Medicine

## 2024-01-07 DIAGNOSIS — R053 Chronic cough: Secondary | ICD-10-CM

## 2024-01-07 NOTE — Progress Notes (Signed)
 His chest x-ray shows diffuse fullness in his lungs but no signs of pneumonia or bronchitis.  Radiology was concerned about fibrosis and recommended a high-resolution chest CT.  We can order this now or if they prefer they can wait until they see the pulmonologist.  They should let us  know if their symptoms are not improving with the treatment plan we discussed at the most recent office visit.

## 2024-01-14 ENCOUNTER — Ambulatory Visit
Admission: RE | Admit: 2024-01-14 | Discharge: 2024-01-14 | Disposition: A | Discharge status: Home / Self Care | Source: Ambulatory Visit | Attending: Family Medicine | Admitting: Family Medicine

## 2024-01-14 DIAGNOSIS — R053 Chronic cough: Secondary | ICD-10-CM

## 2024-01-17 NOTE — Telephone Encounter (Signed)
 See note

## 2024-01-18 DIAGNOSIS — J8410 Pulmonary fibrosis, unspecified: Secondary | ICD-10-CM

## 2024-01-18 HISTORY — DX: Pulmonary fibrosis, unspecified: J84.10

## 2024-01-19 ENCOUNTER — Ambulatory Visit: Payer: Self-pay | Admitting: Family Medicine

## 2024-01-19 ENCOUNTER — Ambulatory Visit (INDEPENDENT_AMBULATORY_CARE_PROVIDER_SITE_OTHER)

## 2024-01-19 ENCOUNTER — Ambulatory Visit: Payer: Self-pay

## 2024-01-19 VITALS — Ht 71.0 in | Wt 159.0 lb

## 2024-01-19 DIAGNOSIS — Z0000 Encounter for general adult medical examination without abnormal findings: Secondary | ICD-10-CM | POA: Diagnosis not present

## 2024-01-19 NOTE — Progress Notes (Signed)
 His CT scan shows pulmonary fibrosis and emphysema.  It is very important that he keep his appointment with pulmonology next week to discuss treatment options for this.  They should let us  know if he has had any worsening symptoms since his office visit here.

## 2024-01-19 NOTE — Telephone Encounter (Signed)
 FYI Only or Action Required?: FYI only for provider.  Patient called to get rescheduled for new patient appointment. Patient was transferred over to PAS for scheduling.   Called Nurse Triage reporting Cough.  Symptoms began several months ago.  Interventions attempted: Rescue inhaler and Maintenance inhaler.  Symptoms are: unchanged.  Triage Disposition: See Physician Within 24 Hours  Patient/caregiver understands and will follow disposition?: No, wishes to speak with PCP  Copied from CRM #[id number]. Topic: Clinical - Red Word Triage >> Jan 19, 2024  4:04 PM Devaughn RAMAN wrote: Red Word that prompted transfer to Nurse Triage: Patient daughter Verneita called and stated the patient has a cough and loses oxygen or has a seizure and he passes out and comes right back and he shakes this last happened on Monday. Reason for Disposition  SEVERE coughing spells (e.g., whooping sound after coughing, vomiting after coughing)  Answer Assessment - Initial Assessment Questions Cough has been going on for several weeks. Patient is coughing so hard-family isn't sure if he loses oxygen or has a seizure or he is passing out. Patient comes to after a couple of seconds. CT results came today that showed pulmonary fibrosis and emphysema. Daughter called due to patient's wife accidentally canceling his scheduled appointment for Monday. Daughter is hoping to get patient rescheduled for Monday. Call transferred over to PAS for scheduling.   1. ONSET: When did the cough begin?      Has been going on for several weeks 2. SEVERITY: How bad is the cough today?      Moderate today 3. SPUTUM: Describe the color of your sputum (e.g., none, dry cough; clear, white, yellow, green)     dry 4. HEMOPTYSIS: Are you coughing up any blood? If so ask: How much? (e.g., flecks, streaks, tablespoons, etc.)     no 5. DIFFICULTY BREATHING: Are you having difficulty breathing? If Yes, ask: How bad is it? (e.g., mild,  moderate, severe)      no 6. FEVER: Do you have a fever? If Yes, ask: What is your temperature, how was it measured, and when did it start?     no 7. CARDIAC HISTORY: Do you have any history of heart disease? (e.g., heart attack, congestive heart failure)      no 8. LUNG HISTORY: Do you have any history of lung disease?  (e.g., pulmonary embolus, asthma, emphysema)     Chronic cough 9. PE RISK FACTORS: Do you have a history of blood clots? (or: recent major surgery, recent prolonged travel, bedridden)     no 10. OTHER SYMPTOMS: Do you have any other symptoms? (e.g., runny nose, wheezing, chest pain)       no 12. TRAVEL: Have you traveled out of the country in the last month? (e.g., travel history, exposures)       no  Protocols used: Cough - Chronic-A-AH

## 2024-01-19 NOTE — Patient Instructions (Signed)
 [name],  Thank you for taking the time for your Medicare Wellness Visit. I appreciate your continued commitment to your health goals. Please review the care plan we discussed, and feel free to reach out if I can assist you further.  Medicare recommends these wellness visits once per year to help you and your care team stay ahead of potential health issues. These visits are designed to focus on prevention, allowing your provider to concentrate on managing your acute and chronic conditions during your regular appointments.  Please note that Annual Wellness Visits do not include a physical exam. Some assessments may be limited, especially if the visit was conducted virtually. If needed, we may recommend a separate in-person follow-up with your provider.  Ongoing Care Seeing your primary care provider every 3 to 6 months helps us  monitor your health and provide consistent, personalized care.   Referrals If a referral was made during today's visit and you haven't received any updates within two weeks, please contact the referred provider directly to check on the status.  Recommended Screenings:  Health Maintenance  Topic Date Due   COVID-19 Vaccine (1) Never done   Zoster (Shingles) Vaccine (1 of 2) Never done   Medicare Annual Wellness Visit  05/09/2023   Flu Shot  11/26/2023   Pneumococcal Vaccine for age over 42 (2 of 2 - PCV) 06/30/2024*   Screening for Lung Cancer  01/13/2025   Hepatitis C Screening  Completed   HPV Vaccine  Aged Out   Meningitis B Vaccine  Aged Out   DTaP/Tdap/Td vaccine  Discontinued   Colon Cancer Screening  Discontinued  *Topic was postponed. The date shown is not the original due date.       01/19/2024    2:38 PM  Advanced Directives  Does Patient Have a Medical Advance Directive? Yes  Type of Estate agent of [place];Living will  Does patient want to make changes to medical advance directive? No - Patient declined  Copy of  Healthcare Power of Attorney in Chart? Yes - validated most recent copy scanned in chart (See row information)   Advance Care Planning is important because it: Ensures you receive medical care that aligns with your values, goals, and preferences. Provides guidance to your family and loved ones, reducing the emotional burden of decision-making during critical moments.  Vision: Annual vision screenings are recommended for early detection of glaucoma, cataracts, and diabetic retinopathy. These exams can also reveal signs of chronic conditions such as diabetes and high blood pressure.  Dental: Annual dental screenings help detect early signs of oral cancer, gum disease, and other conditions linked to overall health, including heart disease and diabetes.  Please see the attached documents for additional preventive care recommendations.

## 2024-01-19 NOTE — Progress Notes (Addendum)
 Subjective:   William Gibson is a 78 y.o. who presents for a Medicare Wellness preventive visit.  As a reminder, Annual Wellness Visits don't include a physical exam, and some assessments may be limited, especially if this visit is performed virtually. We may recommend an in-person follow-up visit with your provider if needed.  Visit Complete: Virtual I connected with  William Gibson on 01/19/24 by a audio enabled telemedicine application and verified that I am speaking with the correct person using two identifiers.  Patient Location: Home  Provider Location: Home Office  I discussed the limitations of evaluation and management by telemedicine. The patient expressed understanding and agreed to proceed.  Vital Signs: Because this visit was a virtual/telehealth visit, some criteria may be missing or patient reported. Any vitals not documented were not able to be obtained and vitals that have been documented are patient reported.  VideoDeclined- This patient declined Librarian, academic. Therefore the visit was completed with audio only.  Persons Participating in Visit: wife William Gibson and patient was present during visit.  AWV Questionnaire: No: Patient Medicare AWV questionnaire was not completed prior to this visit.  Cardiac Risk Factors include: advanced age (>80men, >54 women);smoking/ tobacco exposure;male gender;dyslipidemia     Objective:    Today's Vitals   01/19/24 1429  Weight: 159 lb (72.1 kg)  Height: 5' 11 (1.803 m)   Body mass index is 22.18 kg/m.     01/19/2024    2:38 PM 05/08/2022   12:08 PM 04/12/2020   10:07 AM 12/11/2019    2:52 PM 11/10/2019    4:00 PM 11/09/2019    5:00 PM 11/04/2019    3:09 PM  Advanced Directives  Does Patient Have a Medical Advance Directive? Yes Yes Yes Yes Yes Yes Yes  Type of Estate agent of [place];Living will Healthcare Power of [place];Living will Healthcare Power of [employer] of [place];Living will Healthcare Power of [employer] Power of [employer] Power of Attorney  Does patient want to make changes to medical advance directive? No - Patient declined No - Patient declined   No - Patient declined No - Patient declined   Copy of Healthcare Power of Attorney in Chart? Yes - validated most recent copy scanned in chart (See row information) Yes - validated most recent copy scanned in chart (See row information) Yes - validated most recent copy scanned in chart (See row information)  No - copy requested No - copy requested No - copy requested    Current Medications (verified) Outpatient Encounter Medications as of 01/19/2024  Medication Sig   acetaminophen  (TYLENOL ) 650 MG CR tablet Take 650 mg by mouth every 8 (eight) hours as needed for pain.   albuterol  (VENTOLIN  HFA) 108 (90 Base) MCG/ACT inhaler TAKE 2 PUFFS BY MOUTH EVERY 6 HOURS AS NEEDED FOR WHEEZE OR SHORTNESS OF BREATH   aspirin  EC 81 MG tablet Take 1 tablet (81 mg total) by mouth daily. Swallow whole.   Fluticasone -Umeclidin-Vilant (TRELEGY ELLIPTA ) 100-62.5-25 MCG/ACT AEPB Inhale 1 puff into the lungs daily.   levETIRAcetam  (KEPPRA ) 250 MG tablet Take 1 tablet (250 mg total) by mouth 2 (two) times daily.   mirabegron  ER (MYRBETRIQ ) 25 MG TB24 tablet Take 1 tablet (25 mg total) by mouth daily.   rosuvastatin  (CRESTOR ) 10 MG tablet Take 1 tablet (10 mg total) by mouth daily.   thiamine  (VITAMIN B-1) 100 MG tablet Take 1 tablet (100 mg total) by mouth daily.  azithromycin  (ZITHROMAX ) 250 MG tablet Take 2 tabs day 1, then 1 tab daily   cyanocobalamin (VITAMIN B12) 1000 MCG tablet Take 1 tablet (1,000 mcg total) by mouth daily. (Patient not taking: Reported on 01/19/2024)   [DISCONTINUED] predniSONE  (DELTASONE ) 50 MG tablet Take 1 tablet daily for 5 days.   No facility-administered encounter medications on file as of 01/19/2024.    Allergies (verified) Adalimumab    History: Past Medical History:  Diagnosis Date   Abnormal liver function tests 12/15/2018   Alcoholic liver disease 12/15/2018   Alcoholism (HCC)    Elevated LDL cholesterol level 05/22/2013   Epistaxis    Erectile dysfunction 05/22/2013   Frequent falls 11/09/2018   GERD (gastroesophageal reflux disease)    Hyperammonemia 11/15/2018   Memory change 08/17/2017   RA (rheumatoid arthritis) (HCC) 08/17/2017   RBBB 12/15/2018   Rheumatoid arthritis (HCC) 2010   Shuffling gait 12/19/2018   Tobacco abuse 04/26/2012   Weight loss, unintentional 11/09/2018   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 12/15/2018   Past Surgical History:  Procedure Laterality Date   NASAL ENDOSCOPY WITH EPISTAXIS CONTROL N/A 04/12/2018   Procedure: NASAL ENDOSCOPY WITH EPISTAXIS CONTROL;  Surgeon: Roark Rush, MD;  Location: [facility];  Service: ENT;  Laterality: N/A;   VASECTOMY     VASECTOMY REVERSAL     Family History  Problem Relation Age of Onset   Heart disease Mother    Lung cancer Father    Heart attack Father    CAD Brother    Aneurysm Brother    Colon cancer Neg Hx    Esophageal cancer Neg Hx    Stomach cancer Neg Hx    Social History   Socioeconomic History   Marital status: Married    Spouse name: Not on file   Number of children: Not on file   Years of education: Not on file   Highest education level: Not on file  Occupational History   Occupation: retired  Tobacco Use   Smoking status: Every Day    Current packs/day: 1.50    Average packs/day: 1.5 packs/day for 58.0 years (87.0 ttl pk-yrs)    Types: Cigarettes   Smokeless tobacco: Never   Tobacco comments:    Pt states he smokes 3/4 of a  ppd   Vaping Use   Vaping status: Never Used  Substance and Sexual Activity   Alcohol use: Not Currently    Comment: 1/5 whiskey a day prior; none in last two weeks as of 05/16/21   Drug use: No   Sexual activity: Not Currently  Other Topics Concern   Not on file  Social History Narrative    Married. 3 children. 8 grandkids. 1 greatgrandkid.       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   Desires CPR, does not want prolonged life support if futile.         05/16/2021   Caffeine: a little bit, some days none, sometimes 1 cup   Social Drivers of Corporate investment banker Strain: Low Risk  (01/19/2024)   Overall Financial Resource Strain (CARDIA)    Difficulty of Paying Living Expenses: Not hard at all  Food Insecurity: No Food Insecurity (01/19/2024)   Hunger Vital Sign    Worried About Running Out of Food in the Last Year: Never true    Ran Out of Food in the Last Year: Never  true  Transportation Needs: No Transportation Needs (01/19/2024)   PRAPARE - Administrator, Civil Service (Medical): No    Lack of Transportation (Non-Medical): No  Physical Activity: Inactive (01/19/2024)   Exercise Vital Sign    Days of Exercise per Week: 0 days    Minutes of Exercise per Session: 0 min  Stress: No Stress Concern Present (01/19/2024)   [employer] of Occupational Health - Occupational Stress Questionnaire    Feeling of Stress: Not at all  Social Connections: Moderately Isolated (01/19/2024)   Social Connection and Isolation Panel    Frequency of Communication with Friends and Family: Twice a week    Frequency of Social Gatherings with Friends and Family: Twice a week    Attends Religious Services: Never    Database administrator or Organizations: No    Attends Engineer, structural: Never    Marital Status: Married    Tobacco Counseling Ready to quit: Not Answered Counseling given: Not Answered Tobacco comments: Pt states he smokes 3/4 of a  ppd     Clinical Intake:  Pre-visit preparation completed: Yes  Pain : No/denies pain     BMI - recorded: 22.18 Nutritional Status: BMI of 19-24  Normal Nutritional Risks: None Diabetes: No  Lab Results  Component Value Date   HGBA1C 6.0  03/05/2022   HGBA1C 6.1 04/29/2021   HGBA1C 5.9 11/10/2018     How often do you need to have someone help you when you read instructions, pamphlets, or other written materials from your doctor or pharmacy?: 1 - Never  Interpreter Needed?: No  Information entered by :: Ellouise Haws, LPN   Activities of Daily Living     01/19/2024    2:31 PM  In your present state of health, do you have any difficulty performing the following activities:  Hearing? 0  Vision? 0  Difficulty concentrating or making decisions? 0  Walking or climbing stairs? 0  Dressing or bathing? 0  Doing errands, shopping? 0  Preparing Food and eating ? Y  Comment wife  Using the Toilet? N  In the past six months, have you accidently leaked urine? Y  Comment at times  Do you have problems with loss of bowel control? N  Managing your Medications? N  Managing your Finances? N  Housekeeping or managing your Housekeeping? N    Patient Care Team: Katrinka Garnette KIDD, MD as PCP - General (Family Medicine) Pyrtle, Gordy HERO, MD as Consulting Physician (Gastroenterology) Levora Reyes SAUNDERS, MD as Consulting Physician (Family Medicine) Lavonia Lye, MD as Consulting Physician (Ophthalmology)  I have updated your Care Teams any recent Medical Services you may have received from other providers in the past year.     Assessment:   This is a routine wellness examination for William Gibson.  Hearing/Vision screen Hearing Screening - Comments:: Pt denies any hearing issues  Vision Screening - Comments:: Pt encouraged to follow up with provider    Goals Addressed             This Visit's Progress    Patient Stated       Work on better health        Depression Screen     01/19/2024    2:38 PM 12/02/2023    1:29 PM 07/01/2023   10:59 AM 07/14/2022    1:37 PM 06/02/2022    3:22 PM 05/08/2022   12:06 PM 03/05/2022   10:32 AM  PHQ 2/9 Scores  PHQ -  2 Score 0 0 0 2 3 1  0  PHQ- 9 Score 0 0 0 11 8  0    Fall Risk      01/19/2024    2:41 PM 01/06/2024    2:27 PM 07/14/2022    1:36 PM 06/02/2022    3:21 PM 05/08/2022   12:08 PM  Fall Risk   Falls in the past year? 0 0 1 1 1   Number falls in past yr: 0 0 1 0 1  Injury with Fall? 0 0 0 0 0  Risk for fall due to : Impaired balance/gait;Impaired mobility No Fall Risks No Fall Risks No Fall Risks Impaired vision;History of fall(s);Impaired balance/gait;Impaired mobility  Follow up Falls prevention discussed  Falls evaluation completed Falls evaluation completed Falls prevention discussed      Data saved with a previous flowsheet row definition    MEDICARE RISK AT HOME:  Medicare Risk at Home Any stairs in or around the home?: No If so, are there any without handrails?: No Home free of loose throw rugs in walkways, pet beds, electrical cords, etc?: Yes Adequate lighting in your home to reduce risk of falls?: Yes Life alert?: No Use of a cane, walker or w/c?: No Grab bars in the bathroom?: No Shower chair or bench in shower?: No Elevated toilet seat or a handicapped toilet?: No  TIMED UP AND GO:  Was the test performed?  No  Cognitive Function: Declined/Normal: No cognitive concerns noted by patient or family. Patient alert, oriented, able to answer questions appropriately and recall recent events. No signs of memory loss or confusion.    01/19/2024    2:41 PM 02/08/2019    9:29 AM 08/17/2017    9:55 AM  MMSE - Mini Mental State Exam  Not completed: Unable to complete    Orientation to time  5 5  Orientation to Place  3 4  Registration  3 3  Attention/ Calculation  5 5  Recall  3 3  Language- name 2 objects  2 2  Language- repeat  1 1  Language- follow 3 step command  2 3  Language- read & follow direction  1 1  Write a sentence  1 1  Copy design  1 1  Total score  27 29      05/26/2022    2:19 PM 05/16/2021   11:00 AM  Montreal Cognitive Assessment   Visuospatial/ Executive (0/5) 3 4  Naming (0/3) 3 3  Attention: Read list of digits (0/2)  1 2  Attention: Read list of letters (0/1) 1 1  Attention: Serial 7 subtraction starting at 100 (0/3) 3 3  Language: Repeat phrase (0/2) 2 2  Language : Fluency (0/1) 0 0  Abstraction (0/2) 2 2  Delayed Recall (0/5) 2 0  Orientation (0/6) 5 5  Total 22 22      Immunizations Immunization History  Administered Date(s) Administered   Fluad Quad(high Dose 65+) 04/18/2019, 01/12/2020   INFLUENZA, HIGH DOSE SEASONAL PF 03/11/2021   Pneumococcal Polysaccharide-23 04/12/2020    Screening Tests Health Maintenance  Topic Date Due   COVID-19 Vaccine (1) Never done   Zoster Vaccines- Shingrix (1 of 2) Never done   Influenza Vaccine  11/26/2023   Pneumococcal Vaccine: 50+ Years (2 of 2 - PCV) 06/30/2024 (Originally 04/12/2021)   Lung Cancer Screening  01/13/2025   Medicare Annual Wellness (AWV)  01/18/2025   Hepatitis C Screening  Completed   HPV VACCINES  Aged Out  Meningococcal B Vaccine  Aged Out   DTaP/Tdap/Td  Discontinued   Colonoscopy  Discontinued    Health Maintenance Items Addressed: See Nurse Notes at the end of this note  Additional Screening:  Vision Screening: Recommended annual ophthalmology exams for early detection of glaucoma and other disorders of the eye. Is the patient up to date with their annual eye exam?  No  Who is the provider or what is the name of the office in which the patient attends annual eye exams? Pt encouraged to follow up with provider  Dental Screening: Recommended annual dental exams for proper oral hygiene  Community Resource Referral / Chronic Care Management: CRR required this visit?  No   CCM required this visit?  No   Plan:    I have personally reviewed and noted the following in the patient's chart:   Medical and social history Use of alcohol, tobacco or illicit drugs  Current medications and supplements including opioid prescriptions. Patient is not currently taking opioid prescriptions. Functional ability and  status Nutritional status Physical activity Advanced directives List of other physicians Hospitalizations, surgeries, and ER visits in previous 12 months Vitals Screenings to include cognitive, depression, and falls Referrals and appointments  In addition, I have reviewed and discussed with patient certain preventive protocols, quality metrics, and best practice recommendations. A written personalized care plan for preventive services as well as general preventive health recommendations were provided to patient.   Ellouise VEAR Haws, LPN   [id number]   After Visit Summary: (MyChart) Due to this being a telephonic visit, the after visit summary with patients personalized plan was offered to patient via MyChart   Notes: Nothing significant to report at this time.

## 2024-01-21 DIAGNOSIS — I639 Cerebral infarction, unspecified: Secondary | ICD-10-CM

## 2024-01-21 DIAGNOSIS — R569 Unspecified convulsions: Secondary | ICD-10-CM | POA: Diagnosis not present

## 2024-01-21 DIAGNOSIS — Z87820 Personal history of traumatic brain injury: Secondary | ICD-10-CM | POA: Diagnosis not present

## 2024-01-24 ENCOUNTER — Ambulatory Visit: Admitting: Student in an Organized Health Care Education/Training Program

## 2024-01-24 ENCOUNTER — Encounter: Payer: Self-pay | Admitting: Student in an Organized Health Care Education/Training Program

## 2024-01-24 ENCOUNTER — Ambulatory Visit (INDEPENDENT_AMBULATORY_CARE_PROVIDER_SITE_OTHER): Admitting: Student in an Organized Health Care Education/Training Program

## 2024-01-24 VITALS — BP 120/86 | HR 76 | Temp 97.7°F | Ht 71.0 in | Wt 162.8 lb

## 2024-01-24 DIAGNOSIS — M0579 Rheumatoid arthritis with rheumatoid factor of multiple sites without organ or systems involvement: Secondary | ICD-10-CM | POA: Diagnosis not present

## 2024-01-24 DIAGNOSIS — J849 Interstitial pulmonary disease, unspecified: Secondary | ICD-10-CM | POA: Diagnosis not present

## 2024-01-24 NOTE — Patient Instructions (Signed)
 Today, I ordered blood work. You can get them draw at your preferred [facility] draw station. The nearest one to [facility] is at nearby [facility] ([address] [place], [place], [place] [address]).

## 2024-01-24 NOTE — Progress Notes (Signed)
 Assessment & Plan:   1. ILD (interstitial lung disease) (HCC) 2. Rheumatoid arthritis - ILD  Presents for the evaluation of shortness of breath and cough in the setting of known history of rheumatoid arthritis. I have personally reviewed his most recent high resolution chest CT, and compared to his previous scans (for lung cancer screening). The CT scan shows findings of UIP. Given his reported history of RA, this is highly suggestive of RA-ILD. While the CT shows fibrotic changes with honey comb formation, it is also my impression that this is not the predominant finding and there are signs of active inflammation with septal thickening. He will need to be treated for this.  I will resend his autoimmune panel and obtain a pulmonary function test to evaluate and establish a baseline.  I will also refer him back to rheumatology for initiation of DMARD therapy, and suspect he would be best served with steroid sparing agents such as mycophenolate or rituximab. Will also obtain a PFT to also evaluate for COPD.  He will continue on Trelegy for now.  - Ambulatory referral to Rheumatology - Aldolase - ANA 12 Plus Profile (RDL) - ANCA Profile - Anti-CCP Ab, IgG + IgA (RDL) - CK - Hypersensitivity Pneumonitis - MyoMarker 3 Plus Profile (RDL) - Rheumatoid factor - Pulmonary Function Test; Future - Continue Trelegy  Return in about 5 weeks (around 02/28/2024).  I spent 60 minutes caring for this patient today, including preparing to see the patient, obtaining a medical history , reviewing a separately obtained history, performing a medically appropriate examination and/or evaluation, counseling and educating the patient/family/caregiver, ordering medications, tests, or procedures, documenting clinical information in the electronic health record, and independently interpreting results (not separately reported/billed) and communicating results to the patient/family/caregiver  Belva November, MD [facility]   End of visit medications:  No orders of the defined types were placed in this encounter.    Current Outpatient Medications:    acetaminophen  (TYLENOL ) 650 MG CR tablet, Take 650 mg by mouth every 8 (eight) hours as needed for pain., Disp: , Rfl:    albuterol  (VENTOLIN  HFA) 108 (90 Base) MCG/ACT inhaler, TAKE 2 PUFFS BY MOUTH EVERY 6 HOURS AS NEEDED FOR WHEEZE OR SHORTNESS OF BREATH, Disp: 8.5 each, Rfl: 1   aspirin  EC 81 MG tablet, Take 1 tablet (81 mg total) by mouth daily. Swallow whole., Disp: 90 tablet, Rfl: 3   cyanocobalamin (VITAMIN B12) 1000 MCG tablet, Take 1 tablet (1,000 mcg total) by mouth daily., Disp: 90 tablet, Rfl: 3   Fluticasone -Umeclidin-Vilant (TRELEGY ELLIPTA ) 100-62.5-25 MCG/ACT AEPB, Inhale 1 puff into the lungs daily., Disp: 1 each, Rfl: 11   levETIRAcetam  (KEPPRA ) 250 MG tablet, Take 1 tablet (250 mg total) by mouth 2 (two) times daily., Disp: 60 tablet, Rfl: 5   mirabegron  ER (MYRBETRIQ ) 25 MG TB24 tablet, Take 1 tablet (25 mg total) by mouth daily., Disp: 30 tablet, Rfl: 0   rosuvastatin  (CRESTOR ) 10 MG tablet, Take 1 tablet (10 mg total) by mouth daily., Disp: 90 tablet, Rfl: 3   thiamine  (VITAMIN B-1) 100 MG tablet, Take 1 tablet (100 mg total) by mouth daily., Disp: 90 tablet, Rfl: 3   azithromycin  (ZITHROMAX ) 250 MG tablet, Take 2 tabs day 1, then 1 tab daily (Patient not taking: Reported on 01/24/2024), Disp: 6 each, Rfl: 0   Subjective:   PATIENT ID: William Gibson GENDER: male DOB: April 25, 1946, MRN: [id number]  Chief Complaint  Patient presents with   Consult  Requesting to go over CT scan done on 01/14/2024. Daughter reports occasional cough. Patient reports cough improved after starting Trelegy.     HPI  Patient is a pleasant 78 year old male presenting to clinic for the evaluation of respiratory symptoms as well as abnormal CT scan findings.  Patient has been experiencing increased shortness of breath as well as a cough  for the past year.  The cough has been persistent and was not present prior to this.  This was not productive of sputum.  He does not have any chest pain or chest tightness.  He describes the shortness of breath as mild to moderate and he is more limited in activity because of his arthritis.  He denies any fevers, chills, night sweats, or recurrent infections.  Patient has been seen by his primary care physician and most recently prescribed a course of prednisone  and azithromycin .  He was also started on Trelegy 1 puff once daily.  These interventions made him feel significantly better.  High-resolution chest CT was also ordered which showed signs of pulmonary fibrosis suggestive of UIP for which she is referred to pulmonary.  Patient used to work in the trucking business.  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  He does not have any significant occupational exposures.  He does report smoking, having started the age of 49.  He continues to smoke, currently three quarters of a pack.  He has at least 60 pack years of smoking history.  Past medical history reviewed.  Patient has been diagnosed with rheumatoid arthritis in the past and was previously seen by a rheumatologist. I do not have access to his records from rheumatology. I do note blood work showing elevated RF in the past. The notes also mention an elevated anti-CCP but I cannot see that result.  Ancillary information including prior medications, full medical/surgical/family/social histories, and PFTs (when available) are listed below and have been reviewed.   Review of Systems  Constitutional:  Negative for chills, fever and weight loss.  Respiratory:  Positive for cough, sputum production and shortness of breath. Negative for hemoptysis and wheezing.   Cardiovascular:  Negative for chest pain.     Objective:   Vitals:   01/24/24 0823  BP: 120/86  Pulse: 76  Temp: 97.7 F (36.5 C)  TempSrc: Temporal  SpO2: 98%  Weight: 162 lb  12.8 oz (73.8 kg)  Height: 5' 11 (1.803 m)   98% on RA BMI Readings from Last 3 Encounters:  01/24/24 22.71 kg/m  01/19/24 22.18 kg/m  01/06/24 24.18 kg/m   Wt Readings from Last 3 Encounters:  01/24/24 162 lb 12.8 oz (73.8 kg)  01/19/24 159 lb (72.1 kg)  01/06/24 159 lb (72.1 kg)    Physical Exam Constitutional:      Appearance: Normal appearance. He is not ill-appearing.  HENT:     Head: Normocephalic and atraumatic.     Mouth/Throat:     Mouth: Mucous membranes are moist.  Cardiovascular:     Rate and Rhythm: Normal rate and regular rhythm.     Pulses: Normal pulses.     Heart sounds: Normal heart sounds.  Pulmonary:     Effort: Pulmonary effort is normal.     Breath sounds: Rales present.  Abdominal:     Palpations: Abdomen is soft.     Tenderness: There is no abdominal tenderness.  Musculoskeletal:     Right lower leg: No edema.     Left lower leg: No edema.  Comments: Deformities consistent with rheumatoid arthritis noted in both hands (ulnar drift, boutonniere deformities)  Neurological:     General: No focal deficit present.     Mental Status: He is alert and oriented to person, place, and time. Mental status is at baseline.       Ancillary Information    Past Medical History:  Diagnosis Date   Abnormal liver function tests 12/15/2018   Alcoholic liver disease 12/15/2018   Alcoholism (HCC)    Elevated LDL cholesterol level 05/22/2013   Epistaxis    Erectile dysfunction 05/22/2013   Frequent falls 11/09/2018   GERD (gastroesophageal reflux disease)    Hyperammonemia 11/15/2018   Memory change 08/17/2017   RA (rheumatoid arthritis) (HCC) 08/17/2017   RBBB 12/15/2018   Rheumatoid arthritis (HCC) 2010   Shuffling gait 12/19/2018   Tobacco abuse 04/26/2012   Weight loss, unintentional 11/09/2018   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 12/15/2018     Family History  Problem Relation Age of Onset   Heart disease Mother    Lung cancer Father     Heart attack Father    CAD Brother    Aneurysm Brother    Colon cancer Neg Hx    Esophageal cancer Neg Hx    Stomach cancer Neg Hx      Past Surgical History:  Procedure Laterality Date   NASAL ENDOSCOPY WITH EPISTAXIS CONTROL N/A 04/12/2018   Procedure: NASAL ENDOSCOPY WITH EPISTAXIS CONTROL;  Surgeon: Roark Rush, MD;  Location: [facility];  Service: ENT;  Laterality: N/A;   VASECTOMY     VASECTOMY REVERSAL      Social History   Socioeconomic History   Marital status: Married    Spouse name: Not on file   Number of children: Not on file   Years of education: Not on file   Highest education level: Not on file  Occupational History   Occupation: retired  Tobacco Use   Smoking status: Every Day    Current packs/day: 1.00    Average packs/day: 1.5 packs/day for 61.7 years (90.7 ttl pk-yrs)    Types: Cigarettes    Start date: 1964   Smokeless tobacco: Never   Tobacco comments:    Pt states he smokes 3/4 of a  ppd   Vaping Use   Vaping status: Never Used  Substance and Sexual Activity   Alcohol use: Not Currently    Comment: 1/5 whiskey a day prior; none in last two weeks as of 05/16/21   Drug use: No   Sexual activity: Not Currently  Other Topics Concern   Not on file  Social History Narrative   Married. 3 children. 8 grandkids. 1 greatgrandkid.       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   Desires CPR, does not want prolonged life support if futile.         05/16/2021   Caffeine: a little bit, some days none, sometimes 1 cup   Social Drivers of Corporate investment banker Strain: Low Risk  (01/19/2024)   Overall Financial Resource Strain (CARDIA)    Difficulty of Paying Living Expenses: Not hard at all  Food Insecurity: No Food Insecurity (01/19/2024)   Hunger Vital Sign    Worried About Running Out of Food in the Last Year: Never true    Ran Out of Food in the Last Year: Never true  Transportation Needs: No  Transportation Needs (01/19/2024)   PRAPARE - Administrator, Civil Service (Medical): No    Lack of Transportation (Non-Medical): No  Physical Activity: Inactive (01/19/2024)   Exercise Vital Sign    Days of Exercise per Week: 0 days    Minutes of Exercise per Session: 0 min  Stress: No Stress Concern Present (01/19/2024)   [employer] of Occupational Health - Occupational Stress Questionnaire    Feeling of Stress: Not at all  Social Connections: Moderately Isolated (01/19/2024)   Social Connection and Isolation Panel    Frequency of Communication with Friends and Family: Twice a week    Frequency of Social Gatherings with Friends and Family: Twice a week    Attends Religious Services: Never    Database administrator or Organizations: No    Attends Banker Meetings: Never    Marital Status: Married  Catering manager Violence: Not At Risk (01/19/2024)   Humiliation, Afraid, Rape, and Kick questionnaire    Fear of Current or Ex-Partner: No    Emotionally Abused: No    Physically Abused: No    Sexually Abused: No     Allergies  Allergen Reactions   Adalimumab Rash     CBC    Component Value Date/Time   WBC 7.6 11/10/2023 1145   WBC 14.0 (H) 07/14/2022 1528   RBC 4.60 11/10/2023 1145   RBC 4.27 07/14/2022 1528   HGB 14.6 11/10/2023 1145   HCT 44.1 11/10/2023 1145   PLT 211 11/10/2023 1145   MCV 96 11/10/2023 1145   MCH 31.7 11/10/2023 1145   MCH 32.0 04/12/2020 1638   MCHC 33.1 11/10/2023 1145   MCHC 33.2 07/14/2022 1528   RDW 11.8 11/10/2023 1145   LYMPHSABS 1.7 11/10/2023 1145   MONOABS 1.5 (H) 07/14/2022 1528   EOSABS 0.3 11/10/2023 1145   BASOSABS 0.1 11/10/2023 1145    Pulmonary Functions Testing Results:     No data to display          Outpatient Medications Prior to Visit  Medication Sig Dispense Refill   acetaminophen  (TYLENOL ) 650 MG CR tablet Take 650 mg by mouth every 8 (eight) hours as needed for pain.     albuterol   (VENTOLIN  HFA) 108 (90 Base) MCG/ACT inhaler TAKE 2 PUFFS BY MOUTH EVERY 6 HOURS AS NEEDED FOR WHEEZE OR SHORTNESS OF BREATH 8.5 each 1   aspirin  EC 81 MG tablet Take 1 tablet (81 mg total) by mouth daily. Swallow whole. 90 tablet 3   cyanocobalamin (VITAMIN B12) 1000 MCG tablet Take 1 tablet (1,000 mcg total) by mouth daily. 90 tablet 3   Fluticasone -Umeclidin-Vilant (TRELEGY ELLIPTA ) 100-62.5-25 MCG/ACT AEPB Inhale 1 puff into the lungs daily. 1 each 11   levETIRAcetam  (KEPPRA ) 250 MG tablet Take 1 tablet (250 mg total) by mouth 2 (two) times daily. 60 tablet 5   mirabegron  ER (MYRBETRIQ ) 25 MG TB24 tablet Take 1 tablet (25 mg total) by mouth daily. 30 tablet 0   rosuvastatin  (CRESTOR ) 10 MG tablet Take 1 tablet (10 mg total) by mouth daily. 90 tablet 3   thiamine  (VITAMIN B-1) 100 MG tablet Take 1 tablet (100 mg total) by mouth daily. 90 tablet 3   azithromycin  (ZITHROMAX ) 250 MG tablet Take 2 tabs day 1, then 1 tab daily (Patient not taking: Reported on 01/24/2024) 6 each 0   No facility-administered medications prior to visit.

## 2024-01-25 ENCOUNTER — Other Ambulatory Visit: Payer: Self-pay | Admitting: Family Medicine

## 2024-01-27 ENCOUNTER — Ambulatory Visit

## 2024-01-28 ENCOUNTER — Encounter: Payer: Self-pay | Admitting: Neurology

## 2024-02-01 ENCOUNTER — Ambulatory Visit

## 2024-02-01 ENCOUNTER — Ambulatory Visit: Attending: Internal Medicine | Admitting: Internal Medicine

## 2024-02-01 ENCOUNTER — Encounter: Payer: Self-pay | Admitting: Internal Medicine

## 2024-02-01 VITALS — BP 106/68 | HR 85 | Temp 98.0°F | Resp 17 | Ht 68.75 in | Wt 168.2 lb

## 2024-02-01 DIAGNOSIS — Z79899 Other long term (current) drug therapy: Secondary | ICD-10-CM

## 2024-02-01 DIAGNOSIS — M720 Palmar fascial fibromatosis [Dupuytren]: Secondary | ICD-10-CM | POA: Insufficient documentation

## 2024-02-01 DIAGNOSIS — M0579 Rheumatoid arthritis with rheumatoid factor of multiple sites without organ or systems involvement: Secondary | ICD-10-CM | POA: Diagnosis not present

## 2024-02-01 DIAGNOSIS — M159 Polyosteoarthritis, unspecified: Secondary | ICD-10-CM | POA: Diagnosis not present

## 2024-02-01 DIAGNOSIS — R7303 Prediabetes: Secondary | ICD-10-CM | POA: Insufficient documentation

## 2024-02-01 DIAGNOSIS — E782 Mixed hyperlipidemia: Secondary | ICD-10-CM

## 2024-02-01 NOTE — Progress Notes (Signed)
 Office Visit Note  Patient: William Gibson             Date of Birth: [birth year]           MRN: [id number]             PCP: William Garnette KIDD, MD Referring: William Hose, MD Visit Date: 02/01/2024   Subjective:  New Patient (Initial Visit) (Previous rheumatology was pleased with her service. Patient seen pulmonary last week and they believe that the fibrosis is something to do with his RA)   Discussed the use of AI scribe software for clinical note transcription with the patient, who gave verbal consent to proceed.  History of Present Illness   William Gibson is a 78 year old male with seropositive rheumatoid arthritis here for evaluation and management  He has a history of rheumatoid arthritis and has been experiencing lung inflammation, prompting the referral. He was under rheumatologic care with William Gibson for two to three years but stopped due to lack of improvement. Medications tried include Humira, which caused a leg rash, and cimzia which he stopped due to lack of perceived benefit. He finds relief from arthritis symptoms treating OTC with two Advil  and two mixed drinks.  He previously stopped prescription NSAID use after a stroke in late 2022. Also has problems related to past TBI with [facility] and also microvascular changes on brain imaging.  He experiences pain primarily in his left knee and left hip. He is cautious when turning over in bed due to hip pain and feels the bones in his knee move, though it is not painful. He uses a cane for balance and a walker at home due to balance issues, avoiding going outside to prevent falls. He reports knee swelling but not in his hands, though he cannot straighten his hand due to contracture and arthritis.  He has a history of recently taking prednisone  and a Z-Pak. He uses a Trelegy inhaler and albuterol  PRN.  He consumes two to three mixed drinks a day, with some days of no alcohol intake, and has no known liver problems. He has a  past history of alcohol abuse.  Labs reviewed 01/2024 RF 394.8 MyoMarker 3 neg ANCA neg CK/aldolase wnl CCP pending ANA pending  Imaging reviewed 01/19/24 HRCT Chest IMPRESSION: 1. Mild bibasilar predominant pulmonary fibrosis featuring subpleural bronchiolectasis in the deep lung bases. Findings are categorized as probable UIP per consensus guidelines: Diagnosis of Idiopathic Pulmonary Fibrosis: An Official ATS/ERS/JRS/ALAT Clinical Practice Guideline. Am JINNY Honey Crit Care Med Vol 198, Iss 5, [phone], Dec 26 2016. 2. Moderate emphysema and diffuse bilateral bronchial wall thickening. 3. Coronary artery disease.  Activities of Daily Living:  Patient reports morning stiffness for  none.   Patient Reports nocturnal pain.  Difficulty dressing/grooming: Denies Difficulty climbing stairs: Denies Difficulty getting out of chair: Denies Difficulty using hands for taps, buttons, cutlery, and/or writing: Reports  Review of Systems  Constitutional:  Positive for fatigue.  HENT:  Negative for mouth sores and mouth dryness.   Eyes:  Negative for dryness.  Respiratory:  Negative for shortness of breath.   Cardiovascular:  Negative for chest pain and palpitations.  Gastrointestinal:  Negative for blood in stool, constipation and diarrhea.  Endocrine: Negative for increased urination.  Genitourinary:  Positive for involuntary urination.  Musculoskeletal:  Positive for joint pain, gait problem, joint pain, joint swelling, myalgias, muscle weakness and myalgias. Negative for morning stiffness and muscle tenderness.  Skin:  Negative for  color change, rash, hair loss and sensitivity to sunlight.  Allergic/Immunologic: Negative for susceptible to infections.  Neurological:  Negative for dizziness and headaches.  Hematological:  Negative for swollen glands.  Psychiatric/Behavioral:  Negative for depressed mood and sleep disturbance. The patient is not nervous/anxious.     PMFS History:   Patient Active Problem List   Diagnosis Date Noted   Dupuytren contracture of both hands 02/01/2024   Prediabetes 02/01/2024   Right hemiparesis (HCC) 10/21/2021   High risk medication use 05/20/2021   Plantar fasciitis 05/20/2021   History of stroke 05/16/2021   Aortic atherosclerosis 06/03/2020   Emphysema lung (HCC) 06/03/2020   History of pancreatitis 01/12/2020   Major depression in full remission 01/12/2020   Insomnia 01/12/2020   Chronic post-traumatic headache 12/13/2019   Vascular headache    Hyponatremia    History of subarachnoid hemorrhage 11/04/2019   Head injury 11/04/2019   Hyperlipidemia 02/06/2019   Shuffling gait 12/19/2018   Alcoholic liver disease 12/15/2018   RBBB 12/15/2018   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 12/15/2018   Hyperammonemia 11/15/2018   Frequent falls 11/09/2018   RA (rheumatoid arthritis) (HCC) 08/17/2017   Memory change 08/17/2017   Alcohol dependence in early full remission (HCC) 12/15/2016   Osteoarthritis 01/30/2016   GERD (gastroesophageal reflux disease) 05/23/2014   Erectile dysfunction 05/22/2013   Tobacco abuse 04/26/2012    Past Medical History:  Diagnosis Date   Abnormal liver function tests 12/15/2018   Alcoholic liver disease 12/15/2018   Alcoholism (HCC)    Elevated LDL cholesterol level 05/22/2013   Emphysema lung (HCC)    Epistaxis    Erectile dysfunction 05/22/2013   Frequent falls 11/09/2018   GERD (gastroesophageal reflux disease)    Hyperammonemia 11/15/2018   Memory change 08/17/2017   Pulmonary fibrosis (HCC) 01/18/2024   RA (rheumatoid arthritis) (HCC) 08/17/2017   RBBB 12/15/2018   Rheumatoid arthritis (HCC) 2010   Shuffling gait 12/19/2018   Tobacco abuse 04/26/2012   Weight loss, unintentional 11/09/2018   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 12/15/2018    Family History  Problem Relation Age of Onset   Heart disease Mother    Lung cancer Father    Heart attack Father     Diabetes Sister    Pancreatitis Brother    CAD Brother    Aneurysm Brother    Healthy Daughter    Healthy Daughter    Colon cancer Neg Hx    Esophageal cancer Neg Hx    Stomach cancer Neg Hx    Past Surgical History:  Procedure Laterality Date   NASAL ENDOSCOPY WITH EPISTAXIS CONTROL N/A 04/12/2018   Procedure: NASAL ENDOSCOPY WITH EPISTAXIS CONTROL;  Surgeon: Roark Rush, MD;  Location: [facility];  Service: ENT;  Laterality: N/A;   VASECTOMY     VASECTOMY REVERSAL     Social History   Tobacco Use   Smoking status: Every Day    Current packs/day: 1.00    Average packs/day: 1.5 packs/day for 61.8 years (90.8 ttl pk-yrs)    Types: Cigarettes    Start date: 1964    Passive exposure: Current   Smokeless tobacco: Never   Tobacco comments:    Pt states he smokes 3/4 of a  ppd   Vaping Use   Vaping status: Never Used  Substance Use Topics   Alcohol use: Yes    Alcohol/week: 3.0 standard drinks of alcohol    Types: 3 Standard drinks or equivalent per week  Comment: mixed drinks   Drug use: Never   Social History   Social History Narrative   Married. 3 children. 8 grandkids. 1 greatgrandkid.       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   Desires CPR, does not want prolonged life support if futile.         05/16/2021   Caffeine: a little bit, some days none, sometimes 1 cup     Immunization History  Administered Date(s) Administered   Fluad Quad(high Dose 65+) 04/18/2019, 01/12/2020   INFLUENZA, HIGH DOSE SEASONAL PF 03/11/2021   Pneumococcal Polysaccharide-23 04/12/2020     Objective: Vital Signs: BP 106/68 (BP Location: Right Arm, Patient Position: Sitting, Cuff Size: Normal)   Pulse 85   Temp 98 F (36.7 C)   Resp 17   Ht 5' 8.75 (1.746 m)   Wt 168 lb 3.2 oz (76.3 kg)   BMI 25.02 kg/m    Physical Exam HENT:     Mouth/Throat:     Mouth: Mucous membranes are moist.     Pharynx: Oropharynx is clear.   Eyes:     Conjunctiva/sclera: Conjunctivae normal.  Cardiovascular:     Rate and Rhythm: Normal rate and regular rhythm.  Pulmonary:     Effort: Pulmonary effort is normal.     Comments: Basilar inspiratory crackles Musculoskeletal:     Right lower leg: No edema.     Left lower leg: No edema.  Lymphadenopathy:     Cervical: No cervical adenopathy.  Skin:    General: Skin is warm and dry.     Findings: No rash.  Neurological:     Mental Status: He is alert.  Psychiatric:        Mood and Affect: Mood normal.      Musculoskeletal Exam:  Shoulders full ROM no tenderness or swelling Elbows full ROM no tenderness or swelling Wrists full ROM no tenderness or swelling Fingers extensive bony nodules worst at MCP joints, nonreducible lateral deviation, limited flexion and extension, moderate contracture of right 4th finger and left 4th-5th fingers No paraspinal tenderness to palpation over upper and lower back Left hip sharp lateral pain provoked with direct pressure over greater trochanter and with FADIR/FABER movement Knee crepitus, left knee mildly restricted flexion/extnsion   Investigation: No additional findings.  Imaging: CT Chest High Resolution Result Date: 01/19/2024 CLINICAL DATA:  Chronic cough diffuse fullness in lungs EXAM: CT CHEST WITHOUT CONTRAST TECHNIQUE: Multidetector CT imaging of the chest was performed following the standard protocol without intravenous contrast. High resolution imaging of the lungs, as well as inspiratory and expiratory imaging, was performed. RADIATION DOSE REDUCTION: This exam was performed according to the departmental dose-optimization program which includes automated exposure control, adjustment of the mA and/or kV according to patient size and/or use of iterative reconstruction technique. COMPARISON:  10/13/2020 FINDINGS: Cardiovascular: Aortic atherosclerosis. Normal heart size. Left and right coronary artery calcifications. No  pericardial effusion. Mediastinum/Nodes: No enlarged mediastinal, hilar, or axillary lymph nodes. Thyroid  gland, trachea, and esophagus demonstrate no significant findings. Lungs/Pleura: Moderate centrilobular and paraseptal emphysema. Diffuse bilateral bronchial wall thickening. Mild bibasilar predominant pulmonary fibrosis featuring subpleural bronchiolectasis in the deep lung bases (series 7, image 126). No pleural effusion or pneumothorax. Upper Abdomen: No acute abnormality. Musculoskeletal: No chest wall abnormality. No acute osseous findings. IMPRESSION: 1. Mild bibasilar predominant pulmonary fibrosis featuring subpleural bronchiolectasis in the deep lung bases. Findings are categorized as  probable UIP per consensus guidelines: Diagnosis of Idiopathic Pulmonary Fibrosis: An Official ATS/ERS/JRS/ALAT Clinical Practice Guideline. Am JINNY Honey Crit Care Med Vol 198, Iss 5, [phone], Dec 26 2016. 2. Moderate emphysema and diffuse bilateral bronchial wall thickening. 3. Coronary artery disease. Aortic Atherosclerosis (ICD10-I70.0) and Emphysema (ICD10-J43.9). Electronically Signed   By: Marolyn JONETTA Jaksch M.D.   On: 01/19/2024 06:58   DG Chest 2 View Result Date: 01/06/2024 CLINICAL DATA:  Chronic cough and wheezing. EXAM: CHEST - 2 VIEW COMPARISON:  Radiographs 11/07/2019 and 11/03/2019.  CT 05/15/2020. FINDINGS: The heart size and mediastinal contours are stable with aortic atherosclerosis. Diffuse interstitial prominence appears progressive from prior studies, suspicious for progressive fibrotic interstitial lung disease. There is mild biapical scarring. No confluent airspace disease, pleural effusion or pneumothorax. No acute osseous findings. IMPRESSION: Progressive diffuse interstitial prominence suspicious for progressive fibrotic interstitial lung disease. Consider follow-up high-resolution chest CT. SABRA No acute cardiopulmonary process identified. Electronically Signed   By: Elsie Perone M.D.   On:  01/06/2024 15:07    Recent Labs: Lab Results  Component Value Date   WBC 7.6 11/10/2023   HGB 14.6 11/10/2023   PLT 211 11/10/2023   NA 140 11/10/2023   K 4.8 11/10/2023   CL 102 11/10/2023   CO2 21 11/10/2023   GLUCOSE 101 (H) 11/10/2023   BUN 17 11/10/2023   CREATININE 0.86 11/10/2023   BILITOT 0.4 11/10/2023   ALKPHOS 88 11/10/2023   AST 21 11/10/2023   ALT 12 11/10/2023   PROT 8.3 11/10/2023   ALBUMIN 4.1 11/10/2023   CALCIUM  9.5 11/10/2023   GFRAA 99 04/12/2020    Speciality Comments: No specialty comments available.  Procedures:  No procedures performed Allergies: Adalimumab   Assessment / Plan:     Visit Diagnoses: Rheumatoid arthritis involving multiple sites with positive rheumatoid factor (HCC) - Plan: XR Hand 2 View Right, XR Hand 2 View Left, Sedimentation rate, C-reactive protein, QuantiFERON-TB Gold Plus Rheumatoid arthritis with lung involvement, risk of lung scarring. No active joint inflammation, chronic joint damage present based on exam today.  I am not sure to what extent his arthritis symptoms would likely respond to immunosuppression will check labs today also trying to quantify systemic inflammatory activity.  Reviewed pulmonary note but discussed consideration of mycophenolate or rituximab for RA associated ILD.  I would also consider Actemra as a possible option again with his main consideration probably liver monitoring related.  Previously tried and failed to improve or did not tolerate to TNF inhibitors and oral sulfasalazine. - Checking x-ray of bilateral hands for assessing chronic deformities - Checking sed rate and CRP for inflammatory disease activity monitoring - Discussed importance of suppressing rheumatoid arthritis to prevent lung damage. - Consider mycophenolate for lung disease or Actemra/rituxan for joint and lung inflammation. - Consider addition of low-dose maintenance prednisone  if ongoing inflammation  High risk medication  use Checking TB screening baseline anticipating possible needed for biologic DMARD treatment. Recent CBC and CMP reviewed were normal.  Does have history of ongoing regular significant alcohol use so methotrexate would be contraindicated and use caution with other hepatotoxic medications.  Osteoarthritis of multiple joints, unspecified osteoarthritis type Chronic joint damage from rheumatoid arthritis and osteoarthritis. No active inflammation. Pain and stiffness due to chronic damage. - Order hand x-rays to assess joint damage. - Consider low-dose prednisone  for symptom relief if needed.  Dupuytren contracture of both hands Dupuytren's contracture causing tendon sheath fibrosis, finger contracture. No pain or significant functional impairment.  Mixed hyperlipidemia -  Plan: Lipid panel Checking lipid panel with history of hyperlipidemia and consideration of starting biologic DMARD.  Prediabetes - Plan: Hemoglobin A1c Checking hemoglobin A1c previous reviewed were above 6.0% but below diabetic range.  Would be more cautious with addition of low-dose daily steroid if significant elevation.  Trochanteric bursitis of hip Trochanteric bursitis likely due to tendinitis or bursitis, not rheumatoid arthritis. Pain localized to hip side, worsened by lying on affected side.   Orders: Orders Placed This Encounter  Procedures   XR Hand 2 View Right   XR Hand 2 View Left   Sedimentation rate   C-reactive protein   QuantiFERON-TB Gold Plus   Lipid panel   Hemoglobin A1c   No orders of the defined types were placed in this encounter.    Follow-Up Instructions: Return in about 1 month (around 03/03/2024) for New pt RA/ILD f/u 56mo.   Lonni LELON Ester, MD  Note - This record has been created using [employer].  Chart creation errors have been sought, but may not always  have been located. Such creation errors do not reflect on  the standard of medical care.

## 2024-02-02 ENCOUNTER — Ambulatory Visit: Payer: Self-pay | Admitting: Neurology

## 2024-02-02 ENCOUNTER — Encounter (INDEPENDENT_AMBULATORY_CARE_PROVIDER_SITE_OTHER): Payer: Self-pay | Admitting: Neurology

## 2024-02-02 ENCOUNTER — Encounter

## 2024-02-02 DIAGNOSIS — I998 Other disorder of circulatory system: Secondary | ICD-10-CM

## 2024-02-02 DIAGNOSIS — G8191 Hemiplegia, unspecified affecting right dominant side: Secondary | ICD-10-CM

## 2024-02-02 DIAGNOSIS — Z8673 Personal history of transient ischemic attack (TIA), and cerebral infarction without residual deficits: Secondary | ICD-10-CM

## 2024-02-02 DIAGNOSIS — I639 Cerebral infarction, unspecified: Secondary | ICD-10-CM

## 2024-02-02 DIAGNOSIS — S069X0S Unspecified intracranial injury without loss of consciousness, sequela: Secondary | ICD-10-CM

## 2024-02-02 NOTE — Procedures (Signed)
 Patient Name: William Gibson  MRN: [id number]  Referring Physician/Provider: Lauraine Born      Study start date: 01/18/2024 at 0402 PM Study end date: 01/21/2024 at 0630 PM Duration: 74 hours    Clinical History:  This is a 78 year old male, with traumatic brain injury, mild cognitive impairment with concern for partial seizure with spells described as zoning out, stiffening of upper extremities with shaking, 3 times a week, often triggered by coughing but not always.   INTERMITTENT MONITORING with VIDEO TECHNICAL SUMMARY:  This AVEEG was performed using equipment provided by [employer] utilizing Bluetooth ( [employer] ) amplifiers with continuous EEGT attended video collection using encrypted remote transmission via [employer] secured cellular tower network with data rates for each AVEEG performed. This is a Therapist, music AVEEG, obtained, according to the 10-20 international electrode placement system, reformatted digitally into referential and bipolar montages. Data was acquired with a minimum of 21 bipolar connections and sampled at a minimum rate of 250 cycles per second per channel, maximum rate of 450 cycles per second per channel and two channels for EKG. The entire VEEG study was recorded through cable and or radio telemetry for subsequent analysis. Specified epochs of the AVEEG data were identified at the direction of the subject by the depression of a push button by the patient. Each patients event file included data acquired two minutes prior to the push button activation and continuing until two minutes afterwards. AVEEG files were reviewed on [employer] [facility] server, Licensed Software provided by [employer] with a digital high frequency filter set at 70 Hz and a low frequency filter set at 1 Hz with a paper speed of 40mm/s resulting in 10 seconds per digital page. This entire AVEEG was reviewed by the EEG Technologist. Random time samples, random sleep samples, clips,  patient initiated push button files with included patient daily diary logs, EEG Technologist pruned data was reviewed and verified for accuracy and validity by the governing reading neurologist in full details. This AEEGV was fully compliant with all requirements for CPT 97500 for setup, patient education, take down and administered by an EEG technologist.   Long-Term EEG with Video was monitored intermittently by a qualified EEG technologist for the entirety of the recording; quality check-ins were performed at a minimum of every two hours, checking and documenting real-time data and video to assure the integrity and quality of the recording (e.g., camera position, electrode integrity and impedance), and identify the need for maintenance. For intermittent monitoring, an EEG Technologist monitored no more than 12 patients concurrently. Diagnostic video was captured at least 80% of the time during the recording.   PATIENT EVENTS:  There were no patient events noted or captured during this recording.   TECHNOLOGIST EVENTS:  There were notes for intermittent left frontotemporal slowing detected by the reviewing neurodiagnostic technologist for further review. Wicket spikes were seen during drowsiness.   TIME SAMPLES:  10-minutes of every 2 hours recorded are reviewed as random time samples.   SLEEP SAMPLES:  5-minutes of every 24 hours recorded are reviewed as random sleep samples.   AWAKE:  At maximal level of alertness, the posterior dominant background activity was continuous, reactive, low voltage rhythm of 9-10 Hz. This was symmetric, well-modulated, and attenuated with eye opening. Diffuse, symmetric, frontocentral beta range activity was present.   SLEEP:  N1 Sleep (Stage 1) was observed and characterized by the disappearance of alpha rhythm and the appearance of vertex activity.   N2 Sleep (Stage  2) was observed and characterized by vertex waves, K-complexes, and sleep spindles.   N3  (Stage 3) sleep was observed and characterized by high amplitude Delta activity of 20%.   REM sleep was observed.   EKG:  There were no arrhythmias or abnormalities noted during this recording.   Impression:  Abnormal EEG due to intermittent left frontotemporal slowing   Clinical Correlation:  This study is suggestive of neuronal dysfunction within the left frontotemporal region. There were no electrographic seizures, no events, and no epileptiform discharges were seen during the recording.   Marki Frede, MD [facility]

## 2024-02-03 ENCOUNTER — Ambulatory Visit: Payer: Self-pay | Admitting: Student in an Organized Health Care Education/Training Program

## 2024-02-03 LAB — QUANTIFERON-TB GOLD PLUS
Mitogen-NIL: 3.32 [IU]/mL
NIL: 0.04 [IU]/mL
QuantiFERON-TB Gold Plus: NEGATIVE
TB1-NIL: 0 [IU]/mL
TB2-NIL: 0 [IU]/mL

## 2024-02-03 LAB — C-REACTIVE PROTEIN: CRP: <3 mg/L (ref <8.0)

## 2024-02-03 LAB — SEDIMENTATION RATE: Sed Rate: 74 mm/h — ABNORMAL HIGH (ref 0–20)

## 2024-02-03 LAB — HEMOGLOBIN A1C
Hgb A1c MFr Bld: 6.2 % — ABNORMAL HIGH (ref <5.7)
Mean Plasma Glucose: 131 mg/dL
eAG (mmol/L): 7.3 mmol/L

## 2024-02-03 LAB — LIPID PANEL
Cholesterol: 193 mg/dL (ref <200)
HDL: 81 mg/dL (ref >=40)
LDL Cholesterol (Calc): 94 mg/dL
Non-HDL Cholesterol (Calc): 112 mg/dL (ref <130)
Total CHOL/HDL Ratio: 2.4 (calc) (ref <5.0)
Triglycerides: 88 mg/dL (ref <150)

## 2024-02-17 LAB — MYOMARKER 3 PLUS PROFILE (RDL)

## 2024-02-17 LAB — ANA 12 PLUS PROFILE, POSITIVE
Anti-Cardiolipin Ab, IgA (RDL): <12 U/mL (ref <12)
Anti-Cardiolipin Ab, IgG (RDL): <15 GPL U/mL (ref <15)
Anti-Cardiolipin Ab, IgM (RDL): 14 [MPL'U]/mL (ref <13)
Anti-Centromere Ab (RDL): <1:40 {titer}
Anti-Chromatin Ab, IgG (RDL): <20 U (ref <20)
Anti-La (SS-B) Ab (RDL): <20 U (ref <20)
Anti-Ro (SS-A) Ab (RDL): <20 U (ref <20)
Anti-Scl-70 Ab (RDL): <20 U (ref <20)
Anti-Sm Ab (RDL): <20 U (ref <20)
Anti-TPO Ab (RDL): <9 [IU]/mL (ref <9.0)
Anti-dsDNA Ab by Farr(RDL): <8 [IU]/mL (ref <8.0)
C3 Complement (RDL): 201 mg/dL — ABNORMAL HIGH (ref 90–180)
C4 Complement (RDL): 28 mg/dL (ref 10–40)
Speckled Pattern: 1:80 {titer} — ABNORMAL HIGH

## 2024-02-17 LAB — HYPERSENSITIVITY PNEUMONITIS
A. Pullulans Abs: NEGATIVE
A.Fumigatus #1 Abs: NEGATIVE
Micropolyspora faeni, IgG: NEGATIVE
Pigeon Serum Abs: NEGATIVE
Thermoact. Saccharii: NEGATIVE
Thermoactinomyces vulgaris, IgG: NEGATIVE

## 2024-02-17 LAB — ANCA PROFILE
Anti-MPO Antibodies: <0.2 U (ref 0.0–0.9)
Anti-PR3 Antibodies: <0.2 U (ref 0.0–0.9)
Atypical pANCA: <1:20 {titer}
C-ANCA: <1:20 {titer}
P-ANCA: <1:20 {titer}

## 2024-02-17 LAB — ANA 12 PLUS PROFILE (RDL): Anti-Nuclear Ab by IFA (RDL): POSITIVE — AB

## 2024-02-17 LAB — RHEUMATOID FACTOR: Rheumatoid fact SerPl-aCnc: 394.8 [IU]/mL — ABNORMAL HIGH

## 2024-02-17 LAB — ANTI-CCP AB, IGG + IGA (RDL): Anti-CCP Ab, IgG + IgA (RDL): >250 U — ABNORMAL HIGH (ref <20)

## 2024-02-17 LAB — ALDOLASE: Aldolase: 9.5 U/L (ref 3.3–10.3)

## 2024-02-17 LAB — CK: Total CK: 110 U/L (ref 41–331)

## 2024-02-19 ENCOUNTER — Other Ambulatory Visit: Payer: Self-pay | Admitting: Family Medicine

## 2024-02-22 ENCOUNTER — Encounter

## 2024-02-23 ENCOUNTER — Ambulatory Visit (INDEPENDENT_AMBULATORY_CARE_PROVIDER_SITE_OTHER)

## 2024-02-23 DIAGNOSIS — J849 Interstitial pulmonary disease, unspecified: Secondary | ICD-10-CM

## 2024-02-23 DIAGNOSIS — M0579 Rheumatoid arthritis with rheumatoid factor of multiple sites without organ or systems involvement: Secondary | ICD-10-CM

## 2024-02-23 NOTE — Progress Notes (Signed)
 Full PFT performed today.

## 2024-02-23 NOTE — Patient Instructions (Signed)
 Full PFT performed today.

## 2024-02-25 LAB — PULMONARY FUNCTION TEST
DL/VA % pred: 77 %
DL/VA: 3.06 ml/min/mmHg/L
DLCO unc % pred: 58 %
DLCO unc: 13.79 ml/min/mmHg
FEF 25-75 Post: 1.62 L/s
FEF 25-75 Pre: 0.98 L/s
FEF2575-%Change-Post: 64 %
FEF2575-%Pred-Post: 82 %
FEF2575-%Pred-Pre: 50 %
FEV1-%Change-Post: 13 %
FEV1-%Pred-Post: 78 %
FEV1-%Pred-Pre: 69 %
FEV1-Post: 2.19 L
FEV1-Pre: 1.93 L
FEV1FVC-%Change-Post: 1 %
FEV1FVC-%Pred-Pre: 90 %
FEV6-%Change-Post: 11 %
FEV6-%Pred-Post: 90 %
FEV6-%Pred-Pre: 81 %
FEV6-Post: 3.3 L
FEV6-Pre: 2.95 L
FEV6FVC-%Change-Post: 0 %
FEV6FVC-%Pred-Post: 107 %
FEV6FVC-%Pred-Pre: 107 %
FVC-%Change-Post: 11 %
FVC-%Pred-Post: 84 %
FVC-%Pred-Pre: 75 %
FVC-Post: 3.3 L
Post FEV1/FVC ratio: 66 %
Post FEV6/FVC ratio: 100 %
Pre FEV1/FVC ratio: 65 %
Pre FEV6/FVC Ratio: 100 %
RV % pred: 315 %
RV: 8.04 L
TLC % pred: 156 %
TLC: 10.64 L

## 2024-02-28 ENCOUNTER — Ambulatory Visit: Admitting: Student in an Organized Health Care Education/Training Program

## 2024-02-28 ENCOUNTER — Encounter: Payer: Self-pay | Admitting: Student in an Organized Health Care Education/Training Program

## 2024-02-28 VITALS — BP 132/80 | HR 83 | Temp 98.7°F | Ht 68.75 in | Wt 165.8 lb

## 2024-02-28 DIAGNOSIS — J449 Chronic obstructive pulmonary disease, unspecified: Secondary | ICD-10-CM

## 2024-02-28 DIAGNOSIS — M0510 Rheumatoid lung disease with rheumatoid arthritis of unspecified site: Secondary | ICD-10-CM | POA: Diagnosis not present

## 2024-02-28 DIAGNOSIS — F17200 Nicotine dependence, unspecified, uncomplicated: Secondary | ICD-10-CM

## 2024-02-28 DIAGNOSIS — F17210 Nicotine dependence, cigarettes, uncomplicated: Secondary | ICD-10-CM | POA: Diagnosis not present

## 2024-02-28 MED ORDER — ALBUTEROL SULFATE HFA 108 (90 BASE) MCG/ACT IN AERS: 2.0000 | INHALATION_SPRAY | Freq: Four times a day (QID) | RESPIRATORY_TRACT | 2 refills | Status: AC | PRN

## 2024-02-28 NOTE — Patient Instructions (Addendum)
 VISIT SUMMARY: During today's visit, we discussed your shortness of breath and cough, which are related to your rheumatoid arthritis and chronic obstructive pulmonary disease (COPD). We reviewed your recent CT scan and pulmonary function tests, and we talked about your current medications and smoking habits.  YOUR PLAN: -RHEUMATOID ARTHRITIS-ASSOCIATED INTERSTITIAL LUNG DISEASE (UIP PATTERN): This condition involves lung damage linked to your rheumatoid arthritis. We will coordinate with Dr. Jeannetta to start treatment to prevent further lung damage. Blood tests will be done to rule out infections before beginning new medications.  -CHRONIC OBSTRUCTIVE PULMONARY DISEASE, MODERATE: COPD is a chronic lung disease that makes it hard to breathe. You are currently using Trelegy, one puff daily, and now you have an albuterol  rescue inhaler for use as needed. We will monitor your lung function annually and reassess after your rheumatoid arthritis treatment.  -TOBACCO USE DISORDER: Continued smoking can worsen your lung conditions and overall health. It is strongly advised that you quit smoking. Information on smoking cessation has been provided.  INSTRUCTIONS: Please follow up with Dr. Jeannetta regarding your rheumatoid arthritis treatment plan. Complete the blood tests to rule out infections before starting new medications. Use the albuterol  rescue inhaler as needed and continue with your current medication, Trelegy. Schedule an annual lung function test to monitor your COPD. Consider the smoking cessation information provided and make efforts to quit smoking for your overall health.    The [place]  Quitline: Call 1-800-QUIT-NOW ((317)815-1257). The [place] Quitline is a free service for [place]  residents. Trained counselors are available from 8 am until 3 am, 365 days per year. Services are available in both English and Spanish.   Web Resources Free online support programs can help you track your  progress and share experiences with others who are quitting. These are examples: www.becomeanex.org www.trytostop.org  www.smokefree.gov  www.https://www.vargas.com/.aspx   Tobacco Cessation Medications  Nicotine  Replacement Therapy (NRT)  Nicotine  is the addictive part of tobacco smoke, but not the most dangerous part. There are 7000 other toxins in cigarettes, including carbon monoxide, that cause disease. People do not generally become addicted to medication. Common problems: People don't use enough medication or stop too early. Medications are safe and effective. Overdose is very uncommon. Use medications as long as needed (3 months minimum). Some combinations work better than single medications. Long acting medications like the NRT patch and bupropion provide continuous treatment for withdrawal symptoms.  PLUS  Short acting medications like the NRT gum, lozenge, inhaler, and nasal spray help people to cope with breakthrough cravings.  ? Nicotine  Patch  Place patch on hairless skin on upper body, including arms and back. Each day: discard old patch, shower, apply new patch to a different site. Apply hydrocortisone cream to mildly red/irritated areas. Call provider if rash develops. If patch causes sleep disturbance, remove patch at bedtime and replace each morning after shower. Side effects may include: skin irritation, headache, insomnia, abnormal/vivid dreams.  ? Nicotine  Gum  Chew gum slowly, park in cheek when peppery taste or tingling sensation begins (about 15-30 chews). When taste or tingling goes away, begin chewing again. Use until nicotine  is gone (taste or tingle does not return, usually 30 minutes). Park in different areas of mouth. Nicotine  is absorbed through the lining of the mouth. Use enough to control cravings, up to 24 pieces per day (if used alone). Avoid eating or drinking for 15 minutes before using and during use. Side effects may  include: mouth/jaw soreness, hiccups, indigestion, hypersalivation.  If gum is  not chewed correctly, additional side effects may include lightheadedness, nausea/vomiting, throat and mouth irritation.  ? Nicotine  Lozenge  Allow to dissolve slowly in mouth (20-30 minutes). Do not chew or swallow. Nicotine  release may cause a warm tingling sensation. Occasionally rotate to different areas of the mouth. Use enough to control cravings, up to 20 lozenges per day (if used alone). Avoid eating or drinking for 15 minutes before using and during use. Side effects may include: nausea, hiccups, cough, heartburn, headache, gas, insomnia.  ? Nicotine  Nasal Spray Use 1 spray in each nostril (1 dose) and tilt head back for 1 minute. Do not sniff, swallow, or inhale through nose.  Use at least 8 doses (1 spray in each nostril) , up to 40 doses per day (if used alone). To reduce nasal irritation, spray on cotton swab and insert into nose. Side effects may include: nasal and/or throat irritation (hot, peppery, or burning sensation), nasal irritation, tearing, sneezing, cough, headache.  ? Nicotine  Oral Inhaler (puffer) Inhale into the back of the throat or puff in short breaths. Do not inhale into the lungs.  Puff continuously for 20 minutes (about 80 puffs) until cartridge is empty. Change cartridge when it loses the "burning in throat" sensation (feels like air only). Open cartridges can be saved and used again within 24 hours. Use at least 6 and up to 16 cartridges per day (if used alone).  Avoid eating or drinking for 15 minutes before using and during use. Side effects may include: mouth and/or throat irritation, unpleasant taste, cough, nasal irritation, indigestion, hiccups, headache.  ? Chantix  (varenicline ) Days 1-3: Take one 0.5 mg white pill each morning for 3 days, one week before quit date. Days 4-7: Increase to one 0.5 mg white pill twice a day in morning and evening for 4 days.  On Day 8  (target quit date), increase to one 1 mg blue pill twice a day. Maintain this dose for a minimum of 3 months. Take with food and a full glass of water to reduce nausea. Be sure that the two doses are at least 8 hours apart, but try to take second dose early in the evening (i.e. 6 pm) to avoid sleep problems. Common side effects include: nausea, insomnia, headache, abnormal/vivid dreams. Tell your doctor if you have any history of psychiatric illness prior to starting Chantix .  STOP taking CHANTIX  and contact a healthcare provider immediately if you experience agitation, hostility, depressed mood, changes in thoughts or behavior that are not typical for you, thinking about or attempting suicide, allergic or skin reactions including swelling, rash, redness, or peeling of the skin.  For patients who have heart disease: Smoking is a major risk factor for cardiovascular disease, and Chantix  can help you quit smoking. Chantix  may be associated with a small, increased risk of certain heart events in patients who have heart disease. If you have any new or worsening symptoms of heart disease while taking Chantix , such as shortness of breath or trouble breathing, new or worsening chest pain, or new or worsening pain in your legs when walking, call your doctor or get emergency medical help immediately.  ? Wellbutrin / Zyban (bupropion) Take one 150 mg pill each morning for 3 days, one week before target quit date. On Day 4, increase to one 150 mg pill twice a day, morning and evening.  Maintain this dose for a minimum of 3 months. Be sure that the two doses are at least 8 hours apart, but try to take  second dose early in the evening (i.e. 6 pm) to avoid sleep problems. Avoid or minimize use of alcohol when taking this medication. Common side effects include: dry mouth, headache, insomnia, nausea, weight loss.  Risk of seizure is 04/998. STOP taking BUPROPION and contact a healthcare provider immediately if you  experience agitation, hostility, depressed mood, changes in thoughts or behavior that are not typical for you, thinking about or attempting suicide, allergic or skin reactions including swelling, rash, redness, or peeling of the skin.

## 2024-02-28 NOTE — Progress Notes (Signed)
 Assessment & Plan:   #Rheumatoid arthritis-associated interstitial lung disease (RA-ILD)    A CT scan in September 2025 revealed a UIP pattern indicative of interstitial lung disease linked to rheumatoid arthritis. A repeat autoimmune workup confirmed rheumatoid arthritis with positive RF and anti-CCP. Dr. Jeannetta is currently evaluating him for steroid-sparing agents as part of DMARD therapy.   Explained to the patient the potential lung damage from disease progression if rheumatoid arthritis remains untreated. Patient will benefit from disease modifying agents, ideally mycophenolate or rituximab. He will require frequent monitoring of his PFT's and repeat chest CT in the future.  #Chronic obstructive pulmonary disease, moderate    PFT indicates moderate obstruction and confirms COPD, currently managed with ICS/LABA/LAMA therapy with Trelegy. An albuterol  rescue inhaler is now provided for use as needed. Lung function will be monitored annually, with reassessment after rheumatoid arthritis treatment. Will consider adding ensifentrine  in the future depending on his response to RA therapy and findings on repeat PFT's.   - albuterol  (VENTOLIN  HFA) 108 (90 Base) MCG/ACT inhaler; Inhale 2 puffs into the lungs every 6 (six) hours as needed for wheezing or shortness of breath.  Dispense: 8 g; Refill: 2  #Tobacco use disorder    He continues to use tobacco despite the known risks, including exacerbation of lung conditions and potential dementia. Strongly advised to quit smoking to improve lung health and overall well-being. Information on smoking cessation was provided.    Return in about 3 months (around 05/30/2024).  Belva November, MD [facility]  I spent 35 minutes caring for this patient today, including preparing to see the patient, obtaining a medical history , reviewing a separately obtained history, performing a medically appropriate examination and/or evaluation,  counseling and educating the patient/family/caregiver, ordering medications, tests, or procedures, documenting clinical information in the electronic health record, and independently interpreting results (not separately reported/billed) and communicating results to the patient/family/caregiver. 3 minutes utilized during today's visit to counsel the patient regarding the importance and strategies of smoking cessation.  End of visit medications:  Meds ordered this encounter  Medications   albuterol  (VENTOLIN  HFA) 108 (90 Base) MCG/ACT inhaler    Sig: Inhale 2 puffs into the lungs every 6 (six) hours as needed for wheezing or shortness of breath.    Dispense:  8 g    Refill:  2     Current Outpatient Medications:    albuterol  (VENTOLIN  HFA) 108 (90 Base) MCG/ACT inhaler, Inhale 2 puffs into the lungs every 6 (six) hours as needed for wheezing or shortness of breath., Disp: 8 g, Rfl: 2   aspirin  EC 81 MG tablet, Take 1 tablet (81 mg total) by mouth daily. Swallow whole., Disp: 90 tablet, Rfl: 3   cyanocobalamin (VITAMIN B12) 1000 MCG tablet, Take 1 tablet (1,000 mcg total) by mouth daily., Disp: 90 tablet, Rfl: 3   Fluticasone -Umeclidin-Vilant (TRELEGY ELLIPTA ) 100-62.5-25 MCG/ACT AEPB, Inhale 1 puff into the lungs daily., Disp: 1 each, Rfl: 11   ibuprofen  (ADVIL ) 200 MG tablet, Take 200 mg by mouth every 6 (six) hours as needed., Disp: , Rfl:    levETIRAcetam  (KEPPRA ) 250 MG tablet, Take 1 tablet (250 mg total) by mouth 2 (two) times daily., Disp: 60 tablet, Rfl: 5   mirabegron  ER (MYRBETRIQ ) 25 MG TB24 tablet, TAKE 1 TABLET (25 MG TOTAL) BY MOUTH DAILY., Disp: 30 tablet, Rfl: 0   rosuvastatin  (CRESTOR ) 10 MG tablet, Take 1 tablet (10 mg total) by mouth daily., Disp: 90 tablet, Rfl:  3   thiamine  (VITAMIN B-1) 100 MG tablet, Take 1 tablet (100 mg total) by mouth daily., Disp: 90 tablet, Rfl: 3   acetaminophen  (TYLENOL ) 650 MG CR tablet, Take 650 mg by mouth every 8 (eight) hours as needed for  pain. (Patient not taking: Reported on 02/28/2024), Disp: , Rfl:    azithromycin  (ZITHROMAX ) 250 MG tablet, Take 2 tabs day 1, then 1 tab daily (Patient not taking: Reported on 02/28/2024), Disp: 6 each, Rfl: 0   Subjective:   PATIENT ID: William Gibson GENDER: male DOB: Mar 06, 1946, MRN: [id number]  Chief Complaint  Patient presents with   Interstitial Lung Disease    DOE. Occasional wheezing. Cough,dry. Trelegy- daily helps with his breathing.     HPI  Discussed the use of AI scribe software for clinical note transcription with the patient, who gave verbal consent to proceed.  William Gibson is a 78 year old male with rheumatoid arthritis who presents with shortness of breath and cough.  Initial Visit 01/24/2024:  Patient has been experiencing increased shortness of breath as well as a cough for the past year.  The cough has been persistent and was not present prior to this.  This was not productive of sputum.  He does not have any chest pain or chest tightness.  He describes the shortness of breath as mild to moderate and he is more limited in activity because of his arthritis.  He denies any fevers, chills, night sweats, or recurrent infections.   Patient has been seen by his primary care physician and most recently prescribed a course of prednisone  and azithromycin .  He was also started on Trelegy 1 puff once daily.  These interventions made him feel significantly better.  High-resolution chest CT was also ordered which showed signs of pulmonary fibrosis suggestive of UIP for which she is referred to pulmonary.  Return Visit 02/28/2024:  He continues to experience shortness of breath and cough, which are being evaluated in the context of his known rheumatoid arthritis as well as smoking history. A high-resolution CT scan of the chest performed in September 2025 showed a UIP pattern. Pulmonary function tests revealed obstructive lung disease, and he is currently using Trelegy, one  puff once a day.  He has a history of rheumatoid arthritis, confirmed by positive RF and anti-CCP tests. He has been evaluated by rheumatology. He previously saw a rheumatologist in [place]. He was previously on Humira, which caused a rash, leading to a change in medication. He has re-established care with [place] [facility], and was seen by Dr. Jeannetta in clinic.  He has arthritis in all his joints and bursitis in his hip. He was unaware that rheumatoid arthritis could affect his lungs until recently. He is a smoker. He does not currently have a rescue inhaler.      Social and Occupational History:  Patient used to work in the trucking business.  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  He does not have any significant occupational exposures.  He does report smoking, having started the age of 3.  He continues to smoke, currently three quarters of a pack a day.  He has at least 60 pack years of smoking history.   Past medical history reviewed.  Patient has been diagnosed with rheumatoid arthritis in the past and was previously seen by a rheumatologist. He reports having previously been on Humera. I do not have access to his records from rheumatology. I do note blood work showing  elevated RF in the past. The notes also mention an elevated anti-CCP but I cannot see that result.  Ancillary information including prior medications, full medical/surgical/family/social histories, and PFTs (when available) are listed below and have been reviewed.    Review of Systems  Constitutional:  Negative for chills, fever and weight loss.  Respiratory:  Positive for cough and shortness of breath. Negative for hemoptysis, sputum production and wheezing.   Cardiovascular:  Negative for chest pain.     Objective:   Vitals:   02/28/24 1046  BP: 132/80  Pulse: 83  Temp: 98.7 F (37.1 C)  SpO2: 97%  Weight: 165 lb 12.8 oz (75.2 kg)  Height: 5' 8.75 (1.746 m)   97% on RA  BMI Readings from Last  3 Encounters:  02/28/24 24.66 kg/m  02/01/24 25.02 kg/m  01/24/24 22.71 kg/m   Wt Readings from Last 3 Encounters:  02/28/24 165 lb 12.8 oz (75.2 kg)  02/01/24 168 lb 3.2 oz (76.3 kg)  01/24/24 162 lb 12.8 oz (73.8 kg)    Physical Exam    Ancillary Information    Past Medical History:  Diagnosis Date   Abnormal liver function tests 12/15/2018   Alcoholic liver disease 12/15/2018   Alcoholism (HCC)    Elevated LDL cholesterol level 05/22/2013   Emphysema lung (HCC)    Epistaxis    Erectile dysfunction 05/22/2013   Frequent falls 11/09/2018   GERD (gastroesophageal reflux disease)    Hyperammonemia 11/15/2018   Memory change 08/17/2017   Pulmonary fibrosis (HCC) 01/18/2024   RA (rheumatoid arthritis) (HCC) 08/17/2017   RBBB 12/15/2018   Rheumatoid arthritis (HCC) 2010   Shuffling gait 12/19/2018   Tobacco abuse 04/26/2012   Weight loss, unintentional 11/09/2018   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 12/15/2018     Family History  Problem Relation Age of Onset   Heart disease Mother    Lung cancer Father    Heart attack Father    Diabetes Sister    Pancreatitis Brother    CAD Brother    Aneurysm Brother    Healthy Daughter    Healthy Daughter    Colon cancer Neg Hx    Esophageal cancer Neg Hx    Stomach cancer Neg Hx      Past Surgical History:  Procedure Laterality Date   NASAL ENDOSCOPY WITH EPISTAXIS CONTROL N/A 04/12/2018   Procedure: NASAL ENDOSCOPY WITH EPISTAXIS CONTROL;  Surgeon: Roark Rush, MD;  Location: [facility];  Service: ENT;  Laterality: N/A;   VASECTOMY     VASECTOMY REVERSAL      Social History   Socioeconomic History   Marital status: Married    Spouse name: Not on file   Number of children: Not on file   Years of education: Not on file   Highest education level: Not on file  Occupational History   Occupation: retired  Tobacco Use   Smoking status: Every Day    Current packs/day: 1.00    Average packs/day: 1.5  packs/day for 61.8 years (90.8 ttl pk-yrs)    Types: Cigarettes    Start date: 1964    Passive exposure: Current   Smokeless tobacco: Never   Tobacco comments:    Smokes 1 PPD- khj 02/28/2024  Vaping Use   Vaping status: Never Used  Substance and Sexual Activity   Alcohol use: Yes    Alcohol/week: 3.0 standard drinks of alcohol    Types: 3 Standard drinks or equivalent per week  Comment: mixed drinks   Drug use: Never   Sexual activity: Not Currently  Other Topics Concern   Not on file  Social History Narrative   Married. 3 children. 8 grandkids. 1 greatgrandkid.       Retired from being self employed advice worker         Does not have a living will.   Desires CPR, does not want prolonged life support if futile.         05/16/2021   Caffeine: a little bit, some days none, sometimes 1 cup   Social Drivers of Corporate Investment Banker Strain: Low Risk  (01/19/2024)   Overall Financial Resource Strain (CARDIA)    Difficulty of Paying Living Expenses: Not hard at all  Food Insecurity: No Food Insecurity (01/19/2024)   Hunger Vital Sign    Worried About Running Out of Food in the Last Year: Never true    Ran Out of Food in the Last Year: Never true  Transportation Needs: No Transportation Needs (01/19/2024)   PRAPARE - Administrator, Civil Service (Medical): No    Lack of Transportation (Non-Medical): No  Physical Activity: Inactive (01/19/2024)   Exercise Vital Sign    Days of Exercise per Week: 0 days    Minutes of Exercise per Session: 0 min  Stress: No Stress Concern Present (01/19/2024)   [employer] of Occupational Health - Occupational Stress Questionnaire    Feeling of Stress: Not at all  Social Connections: Moderately Isolated (01/19/2024)   Social Connection and Isolation Panel    Frequency of Communication with Friends and Family: Twice a week    Frequency of Social Gatherings with Friends and Family: Twice a week    Attends Religious  Services: Never    Database Administrator or Organizations: No    Attends Banker Meetings: Never    Marital Status: Married  Catering Manager Violence: Not At Risk (01/19/2024)   Humiliation, Afraid, Rape, and Kick questionnaire    Fear of Current or Ex-Partner: No    Emotionally Abused: No    Physically Abused: No    Sexually Abused: No     Allergies  Allergen Reactions   Adalimumab Rash     CBC    Component Value Date/Time   WBC 7.6 11/10/2023 1145   WBC 14.0 (H) 07/14/2022 1528   RBC 4.60 11/10/2023 1145   RBC 4.27 07/14/2022 1528   HGB 14.6 11/10/2023 1145   HCT 44.1 11/10/2023 1145   PLT 211 11/10/2023 1145   MCV 96 11/10/2023 1145   MCH 31.7 11/10/2023 1145   MCH 32.0 04/12/2020 1638   MCHC 33.1 11/10/2023 1145   MCHC 33.2 07/14/2022 1528   RDW 11.8 11/10/2023 1145   LYMPHSABS 1.7 11/10/2023 1145   MONOABS 1.5 (H) 07/14/2022 1528   EOSABS 0.3 11/10/2023 1145   BASOSABS 0.1 11/10/2023 1145    Pulmonary Functions Testing Results:    Latest Ref Rng & Units 02/23/2024    4:51 PM  PFT Results  FVC-Predicted Pre % 75  P  FVC-Post L 3.30  P  FVC-Predicted Post % 84  P  Pre FEV1/FVC % % 65  P  Post FEV1/FCV % % 66  P  FEV1-Pre L 1.93  P  FEV1-Predicted Pre % 69  P  FEV1-Post L 2.19  P  DLCO uncorrected ml/min/mmHg 13.79  P  DLCO UNC% % 58  P  DLVA Predicted % 77  P  TLC  L 10.64  P  TLC % Predicted % 156  P  RV % Predicted % 315  P    P Preliminary result    Outpatient Medications Prior to Visit  Medication Sig Dispense Refill   aspirin  EC 81 MG tablet Take 1 tablet (81 mg total) by mouth daily. Swallow whole. 90 tablet 3   cyanocobalamin (VITAMIN B12) 1000 MCG tablet Take 1 tablet (1,000 mcg total) by mouth daily. 90 tablet 3   Fluticasone -Umeclidin-Vilant (TRELEGY ELLIPTA ) 100-62.5-25 MCG/ACT AEPB Inhale 1 puff into the lungs daily. 1 each 11   ibuprofen  (ADVIL ) 200 MG tablet Take 200 mg by mouth every 6 (six) hours as needed.      levETIRAcetam  (KEPPRA ) 250 MG tablet Take 1 tablet (250 mg total) by mouth 2 (two) times daily. 60 tablet 5   mirabegron  ER (MYRBETRIQ ) 25 MG TB24 tablet TAKE 1 TABLET (25 MG TOTAL) BY MOUTH DAILY. 30 tablet 0   rosuvastatin  (CRESTOR ) 10 MG tablet Take 1 tablet (10 mg total) by mouth daily. 90 tablet 3   thiamine  (VITAMIN B-1) 100 MG tablet Take 1 tablet (100 mg total) by mouth daily. 90 tablet 3   acetaminophen  (TYLENOL ) 650 MG CR tablet Take 650 mg by mouth every 8 (eight) hours as needed for pain. (Patient not taking: Reported on 02/28/2024)     azithromycin  (ZITHROMAX ) 250 MG tablet Take 2 tabs day 1, then 1 tab daily (Patient not taking: Reported on 02/28/2024) 6 each 0   albuterol  (VENTOLIN  HFA) 108 (90 Base) MCG/ACT inhaler TAKE 2 PUFFS BY MOUTH EVERY 6 HOURS AS NEEDED FOR WHEEZE OR SHORTNESS OF BREATH (Patient not taking: Reported on 02/28/2024) 8.5 each 1   No facility-administered medications prior to visit.

## 2024-03-06 ENCOUNTER — Ambulatory Visit: Attending: Internal Medicine | Admitting: Internal Medicine

## 2024-03-06 ENCOUNTER — Encounter: Payer: Self-pay | Admitting: Internal Medicine

## 2024-03-06 VITALS — BP 125/70 | HR 87 | Temp 98.1°F | Resp 18 | Ht 68.75 in | Wt 166.0 lb

## 2024-03-06 DIAGNOSIS — Z79899 Other long term (current) drug therapy: Secondary | ICD-10-CM | POA: Diagnosis not present

## 2024-03-06 DIAGNOSIS — M159 Polyosteoarthritis, unspecified: Secondary | ICD-10-CM | POA: Diagnosis not present

## 2024-03-06 DIAGNOSIS — M7062 Trochanteric bursitis, left hip: Secondary | ICD-10-CM | POA: Diagnosis not present

## 2024-03-06 DIAGNOSIS — M0579 Rheumatoid arthritis with rheumatoid factor of multiple sites without organ or systems involvement: Secondary | ICD-10-CM

## 2024-03-06 MED ORDER — METHOCARBAMOL 500 MG PO TABS: 500.0000 mg | ORAL_TABLET | Freq: Every evening | ORAL | 1 refills | Status: DC | PRN

## 2024-03-06 NOTE — Progress Notes (Signed)
 Office Visit Note  Patient: William Gibson             Date of Birth: Oct 01, 1945           MRN: [id number]             PCP: Katrinka Garnette KIDD, MD Referring: Katrinka Garnette KIDD, MD Visit Date: 03/06/2024   Subjective:  Medication Management (RA Medication for pain )   Discussed the use of AI scribe software for clinical note transcription with the patient, who gave verbal consent to proceed.  History of Present Illness   William Gibson is a 78 year old male here for follow-up seropositive rheumatoid arthritis and associated interstitial lung disease.  He experiences ongoing pain in both knees and shoulders, which disrupts his sleep as he cannot lie comfortably on either side. Additionally, he has left hip pain due to bursitis as the single worst complaint today, and he mentions that all his joints hurt.  His rheumatoid arthritis was confirmed by blood tests showing a high sedimentation rate and rheumatoid factor. He has previously been on injectable medications for arthritis, which he administered himself, but they were ineffective and caused adverse skin reactions.  For his hip bursitis, he currently takes Advil . He recalls receiving a cortisone shot previously, which only provided relief for three to four days. The pain is exacerbated by movement, particularly when moving his leg sideways or turning over in bed, which causes a popping sensation. He has not found long-term relief from previous treatments.      Previous HPI 02/01/24 Walter Grima is a 78 year old male with seropositive rheumatoid arthritis here for evaluation and management   He has a history of rheumatoid arthritis and has been experiencing lung inflammation, prompting the referral. He was under rheumatologic care with Dr. Ishmael for two to three years but stopped due to lack of improvement. Medications tried include Humira, which caused a leg rash, and cimzia which he stopped due to lack of perceived  benefit. He finds relief from arthritis symptoms treating OTC with two Advil  and two mixed drinks.   He previously stopped prescription NSAID use after a stroke in late 2022. Also has problems related to past TBI with [facility] and also microvascular changes on brain imaging.   He experiences pain primarily in his left knee and left hip. He is cautious when turning over in bed due to hip pain and feels the bones in his knee move, though it is not painful. He uses a cane for balance and a walker at home due to balance issues, avoiding going outside to prevent falls. He reports knee swelling but not in his hands, though he cannot straighten his hand due to contracture and arthritis.   He has a history of recently taking prednisone  and a Z-Pak. He uses a Trelegy inhaler and albuterol  PRN.   He consumes two to three mixed drinks a day, with some days of no alcohol intake, and has no known liver problems. He has a past history of alcohol abuse.   Labs reviewed 01/2024 RF 394.8 MyoMarker 3 neg ANCA neg CK/aldolase wnl CCP pending ANA pending   Imaging reviewed 01/19/24 HRCT Chest IMPRESSION: 1. Mild bibasilar predominant pulmonary fibrosis featuring subpleural bronchiolectasis in the deep lung bases. Findings are categorized as probable UIP per consensus guidelines: Diagnosis of Idiopathic Pulmonary Fibrosis: An Official ATS/ERS/JRS/ALAT Clinical Practice Guideline. Am JINNY [employer] Crit Care Med Vol 198, Iss 5, [phone], Dec 26 2016.  2. Moderate emphysema and diffuse bilateral bronchial wall thickening. 3. Coronary artery disease.   Review of Systems  Constitutional:  Positive for fatigue.  HENT:  Negative for mouth sores and mouth dryness.   Eyes:  Negative for dryness.  Respiratory:  Positive for shortness of breath.   Cardiovascular:  Negative for chest pain and palpitations.  Gastrointestinal:  Positive for constipation. Negative for blood in stool and diarrhea.  Endocrine: Negative for  increased urination.  Genitourinary:  Positive for involuntary urination.  Musculoskeletal:  Positive for joint pain, gait problem, joint pain, myalgias, muscle weakness, morning stiffness and myalgias. Negative for joint swelling and muscle tenderness.  Skin:  Negative for color change, rash, hair loss and sensitivity to sunlight.  Allergic/Immunologic: Negative for susceptible to infections.  Neurological:  Negative for dizziness and headaches.  Hematological:  Negative for swollen glands.  Psychiatric/Behavioral:  Positive for sleep disturbance. Negative for depressed mood. The patient is not nervous/anxious.     PMFS History:  Patient Active Problem List   Diagnosis Date Noted   Dupuytren contracture of both hands 02/01/2024   Prediabetes 02/01/2024   Right hemiparesis (HCC) 10/21/2021   High risk medication use 05/20/2021   Plantar fasciitis 05/20/2021   History of stroke 05/16/2021   Aortic atherosclerosis 06/03/2020   Emphysema lung (HCC) 06/03/2020   History of pancreatitis 01/12/2020   Major depression in full remission 01/12/2020   Insomnia 01/12/2020   Chronic post-traumatic headache 12/13/2019   Vascular headache    Hyponatremia    History of subarachnoid hemorrhage 11/04/2019   Head injury 11/04/2019   Hyperlipidemia 02/06/2019   Shuffling gait 12/19/2018   Alcoholic liver disease 12/15/2018   RBBB 12/15/2018   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 12/15/2018   Hyperammonemia 11/15/2018   Frequent falls 11/09/2018   RA (rheumatoid arthritis) (HCC) 08/17/2017   Memory change 08/17/2017   Alcohol dependence in early full remission (HCC) 12/15/2016   Osteoarthritis 01/30/2016   GERD (gastroesophageal reflux disease) 05/23/2014   Erectile dysfunction 05/22/2013   Tobacco abuse 04/26/2012    Past Medical History:  Diagnosis Date   Abnormal liver function tests 12/15/2018   Alcoholic liver disease 12/15/2018   Alcoholism (HCC)    Elevated LDL  cholesterol level 05/22/2013   Emphysema lung (HCC)    Epistaxis    Erectile dysfunction 05/22/2013   Frequent falls 11/09/2018   GERD (gastroesophageal reflux disease)    Hyperammonemia 11/15/2018   Memory change 08/17/2017   Pulmonary fibrosis (HCC) 01/18/2024   RA (rheumatoid arthritis) (HCC) 08/17/2017   RBBB 12/15/2018   Rheumatoid arthritis (HCC) 2010   Shuffling gait 12/19/2018   Tobacco abuse 04/26/2012   Weight loss, unintentional 11/09/2018   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 12/15/2018    Family History  Problem Relation Age of Onset   Heart disease Mother    Lung cancer Father    Heart attack Father    Diabetes Sister    Pancreatitis Brother    CAD Brother    Aneurysm Brother    Healthy Daughter    Healthy Daughter    Colon cancer Neg Hx    Esophageal cancer Neg Hx    Stomach cancer Neg Hx    Past Surgical History:  Procedure Laterality Date   NASAL ENDOSCOPY WITH EPISTAXIS CONTROL N/A 04/12/2018   Procedure: NASAL ENDOSCOPY WITH EPISTAXIS CONTROL;  Surgeon: Roark Rush, MD;  Location: [facility];  Service: ENT;  Laterality: N/A;   VASECTOMY  VASECTOMY REVERSAL     Social History   Social History Narrative   Married. 3 children. 8 grandkids. 1 greatgrandkid.       Retired from being self employed advice worker         Does not have a living will.   Desires CPR, does not want prolonged life support if futile.         05/16/2021   Caffeine: a little bit, some days none, sometimes 1 cup   Immunization History  Administered Date(s) Administered   Fluad Quad(high Dose 65+) 04/18/2019, 01/12/2020   INFLUENZA, HIGH DOSE SEASONAL PF 03/11/2021   Pneumococcal Polysaccharide-23 04/12/2020     Objective: Vital Signs: BP 125/70   Pulse 87   Temp 98.1 F (36.7 C)   Resp 18   Ht 5' 8.75 (1.746 m)   Wt 166 lb (75.3 kg)   BMI 24.69 kg/m    Physical Exam Eyes:     Conjunctiva/sclera: Conjunctivae normal.  Cardiovascular:     Rate  and Rhythm: Normal rate and regular rhythm.  Pulmonary:     Effort: Pulmonary effort is normal.     Comments: Basilar inspiratory crackles Lymphadenopathy:     Cervical: No cervical adenopathy.  Skin:    General: Skin is warm and dry.  Neurological:     Mental Status: He is alert.  Psychiatric:        Mood and Affect: Mood normal.      Musculoskeletal Exam:  Shoulders painful in abduction and internal and external rotation while elevated Elbows full ROM no tenderness or swelling Wrists full ROM no tenderness or swelling Fingers extensive bony nodules worst at MCP joints, nonreducible lateral deviation, limited flexion and extension, moderate contracture of right 4th finger and left 4th-5th fingers No paraspinal tenderness to palpation over upper and lower back Left hip sharp lateral pain provoked with direct pressure over greater trochanter and with FADIR/FABER movement, with hips severely limited internal rotation range of motion Knee crepitus, left knee mildly restricted flexion/extnsion  Investigation: No additional findings.  Imaging: No results found.  Recent Labs: Lab Results  Component Value Date   WBC 7.6 11/10/2023   HGB 14.6 11/10/2023   PLT 211 11/10/2023   NA 140 11/10/2023   K 4.8 11/10/2023   CL 102 11/10/2023   CO2 21 11/10/2023   GLUCOSE 101 (H) 11/10/2023   BUN 17 11/10/2023   CREATININE 0.86 11/10/2023   BILITOT 0.4 11/10/2023   ALKPHOS 88 11/10/2023   AST 21 11/10/2023   ALT 12 11/10/2023   PROT 8.3 11/10/2023   ALBUMIN 4.1 11/10/2023   CALCIUM  9.5 11/10/2023   GFRAA 99 04/12/2020   QFTBGOLDPLUS NEGATIVE 02/01/2024    Speciality Comments: No specialty comments available.  Procedures:  No procedures performed Allergies: Adalimumab   Assessment / Plan:     Visit Diagnoses: Rheumatoid arthritis involving multiple sites with positive rheumatoid factor (HCC) Rheumatoid arthritis confirmed by high rheumatoid factor and sedimentation rate.  Imaging shows joint damage. Inflammatory lung disease present. Previous treatments ineffective by chart review that is Humira and Cimzia. Actemra recommended for joint and lung inflammation with alternate mechanism of action. - Initiated Actemra (tocilizumab) injections 162 mg [place] q 2 weeks. - Monitor response to Actemra over 2-3 months; reassess if no significant improvement.  Trochanteric bursitis, left hip - Plan: methocarbamol  (ROBAXIN ) 500 MG tablet Pain consistent with bursitis. Previous cortisone injections provided short-term relief. Muscle relaxants and topical diclofenac  gel recommended. - Prescribed muscle relaxant for nighttime  use. - Recommended topical Voltaren  (diclofenac  gel) for localized pain relief, up to every six hours as needed.  Osteoarthritis of multiple joints, unspecified osteoarthritis type Osteoarthritis and osteopenia evident in hand imaging. Symptoms include joint pain and stiffness. Actemra may alleviate inflammation-related symptoms. - Continue to monitor symptoms and response to Actemra treatment.   High risk medication use Reviewed risks of Actemra injection including injection site reactions, infections, malignancies, cytopenias, hepatotoxicity, need for medication monitoring.  He has history of extensive vascular disease with previous stroke, vascular headaches, subarachnoid hemorrhage and has risk factors from smoking history and hyperlipidemia.    Orders: No orders of the defined types were placed in this encounter.  Meds ordered this encounter  Medications   methocarbamol  (ROBAXIN ) 500 MG tablet    Sig: Take 1 tablet (500 mg total) by mouth at bedtime as needed for muscle spasms.    Dispense:  30 tablet    Refill:  1     Follow-Up Instructions: Return in about 2 months (around 05/06/2024) for RA/ILD TOC start f/u 2mos.   Lonni LELON Ester, MD  Note - This record has been created using [employer].  Chart creation errors have been sought,  but may not always  have been located. Such creation errors do not reflect on  the standard of medical care.

## 2024-03-06 NOTE — Patient Instructions (Addendum)
 Try using Voltaren  (diclofenac ) gel on the affected hip as needed. This is a stronger antiinflammatory like ibuprofen /aleve  but just in the local area to minimize side effects.  Try adding baclofen at night for bursitis pain as well.

## 2024-03-13 ENCOUNTER — Encounter: Payer: Self-pay | Admitting: Internal Medicine

## 2024-03-14 ENCOUNTER — Telehealth: Payer: Self-pay

## 2024-03-14 DIAGNOSIS — M0579 Rheumatoid arthritis with rheumatoid factor of multiple sites without organ or systems involvement: Secondary | ICD-10-CM

## 2024-03-14 DIAGNOSIS — Z79899 Other long term (current) drug therapy: Secondary | ICD-10-CM

## 2024-03-14 NOTE — Telephone Encounter (Signed)
 Actemra BIV initiated on CMM

## 2024-03-14 NOTE — Telephone Encounter (Signed)
 Patient will be Actemra new start  Submitted an URGENT Prior Authorization request to [employer] for ACTEMRA SQ via CoverMyMeds. Will update once we receive a response.  Key: [id number]

## 2024-03-17 ENCOUNTER — Other Ambulatory Visit (HOSPITAL_COMMUNITY): Payer: Self-pay

## 2024-03-17 MED ORDER — ACTEMRA ACTPEN 162 MG/0.9ML ~~LOC~~ SOAJ
162.0000 mg | SUBCUTANEOUS | 0 refills | Status: AC
Start: 2024-03-17 — End: ?

## 2024-03-17 NOTE — Telephone Encounter (Signed)
 Received notification from [facility] regarding a prior authorization for ACTEMRA  SQ. Authorization has been APPROVED from 03/14/2024 to 03/14/2025. Approval letter sent to scan center.  Unable to run test claim because patient must fill through [facility]: [phone]   Authorization # [id number]  This is a commercial plan so should be able utilize a copay card which I attempted to do. Response: Sorry, We Are Still Processing Your Request More information may be required. Please call [phone]) for further assistance.  Reference number: [facility]  MyChart message sent to patient with next steps. Will await follow-up  Sherry Pennant, PharmD, MPH, BCPS, CPP Clinical Pharmacist [facility])

## 2024-03-20 NOTE — Telephone Encounter (Signed)
 Contacted the copay assistance help desk, it is unclear what exactly was required that was not addressed during the online enrollment, however I was on hold for a fairly considerable amount of time while the rep attempted to bypass something in the system. After about 10 minutes the hold the rep comes back and explains that she needs to escalate the ticket for further assistance.   I inquire as to what exactly the issue is, and she tells me that the patient's account was flagged due to his age and that additional attestations were required, however she is unable to place the override on this pt's account for some reason. She took my direct callback number and will notify me once the pt's account is active.

## 2024-03-20 NOTE — Telephone Encounter (Signed)
 Received call from Actemra  copay program informing me that pt has been approved. The card info is as follows:  RxBIN: [id number] RxPCN: [id number] RxGRP: [id number] ID: [id number]

## 2024-03-20 NOTE — Telephone Encounter (Signed)
 Rx sent to [facility] on Friday.  Sherry Pennant, PharmD, MPH, BCPS, CPP Clinical Pharmacist [facility])

## 2024-03-24 ENCOUNTER — Other Ambulatory Visit: Payer: Self-pay | Admitting: Family Medicine

## 2024-03-25 ENCOUNTER — Encounter: Payer: Self-pay | Admitting: Internal Medicine

## 2024-03-30 ENCOUNTER — Ambulatory Visit: Admitting: Neurology

## 2024-03-30 ENCOUNTER — Encounter: Payer: Self-pay | Admitting: Neurology

## 2024-03-30 VITALS — BP 132/80 | HR 79 | Ht 68.0 in | Wt 168.4 lb

## 2024-03-30 DIAGNOSIS — S069XAA Unspecified intracranial injury with loss of consciousness status unknown, initial encounter: Secondary | ICD-10-CM | POA: Insufficient documentation

## 2024-03-30 DIAGNOSIS — Z8673 Personal history of transient ischemic attack (TIA), and cerebral infarction without residual deficits: Secondary | ICD-10-CM

## 2024-03-30 DIAGNOSIS — S069X0S Unspecified intracranial injury without loss of consciousness, sequela: Secondary | ICD-10-CM

## 2024-03-30 DIAGNOSIS — I998 Other disorder of circulatory system: Secondary | ICD-10-CM | POA: Diagnosis not present

## 2024-03-30 DIAGNOSIS — I639 Cerebral infarction, unspecified: Secondary | ICD-10-CM

## 2024-03-30 DIAGNOSIS — R9082 White matter disease, unspecified: Secondary | ICD-10-CM | POA: Diagnosis not present

## 2024-03-30 MED ORDER — LEVETIRACETAM 500 MG PO TABS: 500.0000 mg | ORAL_TABLET | Freq: Two times a day (BID) | ORAL | 3 refills | Status: AC

## 2024-03-30 NOTE — Progress Notes (Signed)
 Chief Complaint  Patient presents with   Follow-up    Pt in room 15. Daughter in room. Here for CVA follow up. No memory changes.      ASSESSMENT AND PLAN  William Gibson is a 78 y.o. male   Acute left pontine stroke in December 2022,  Mild residual spastic right hemiparesis,   Aspirin  81 mg daily  Vascular risk factor of aging, longtime smoker, alcohol use, hyperlipidemia  MRA of head and neck showed no large vessel disease,  Emphasized the importance of smoking and alcohol cessation  History of traumatic brain injury  Evidence of bifrontal, right temporal tip encephalomalacia,  Abnormal EEG, evidence of left frontal slowing  Seizure improved with Keppra  250 twice a day, still occasionally recurrence, increase to higher dose 500 mg twice a day  Return in 6 months DIAGNOSTIC DATA (LABS, IMAGING, TESTING) - I reviewed patient records, labs, notes, testing and imaging myself where available. MRI brain, MRA neck and head in Jan 2023. Suspect subacute infarct of the parasagittal left pons.  Moderate chronic microvascular ischemic changes. Bifrontal and anterior temporal encephalomalacia. Chronic right cerebellar infarct.  No large vessel occlusion or hemodynamically significant stenosis.     MEDICAL HISTORY:  William Gibson is a 78 year old male,, seen in request by his primary care physician Dr. Katrinka, Garnette for evaluation of memory loss, he is accompanied by his daughter at today's visit on May 16, 2021, the other daughter is connected through [employer],  I reviewed and summarized the referring note. PMHX. HLD Alcohol abuse Rheumatoid arthritis Smoke 1 ppd x60 years  Around Christmas time, he was noted to have sudden onset slurred speech, right arm and leg weakness, leading to MRI of the brain ordered by his primary care physician, I personally reviewed the film, positive DWI lesion at left parasagittal pons, periventricular moderate small vessel disease, right  cerebellar chronic infarction.  In addition bifrontal right anterior temporal encephalomalacia, there is no evidence of large vessel disease.  Patient does report a history of traumatic brain injury in July 2021, fell backwards, landed on occipital region, with prolonged loss of consciousness,  He was not on antiplatelet agent, not taking aspirin  325 mg daily, cardiology appointment pending  Over the past few weeks, his slurred speech improved to baseline, improvement of right arm and leg weakness.  He also had a history of rheumatoid arthritis, multiple joints pain, under the care of rheumatologist, was previously treated with daily meloxicam , now stopped.  He was noted to have mild memory loss, MoCA 22/30 today, missed 5 out of 5 recall,  He used to drink a gallon of hard liquor a week, now he only drinks mild amount mixed with soft drinks, still smokes three-quarter pack a day  UPDATE Dec 4th 2025: He is companied by his daughter at today's clinical visit, he continue to decline slowly, lived at home with his wife, for a while he has multiple recurrent episode of seizure, sudden onset eye rolling back, body shaking, lasting for couple minutes, then went limp, occasionally triggered by cough, it can happen 2-3 times each week  He was started on Keppra  250 mg twice a day, seizure episode has significantly decreased, but still has occasionally occurrence  Better management of his cough with the new inhaler Trelegy also seems to help his spell We again personally reviewed his MRI of the brain from January 2023, significant bifrontal and anterior temporal encephalomalacia, moderate small vessel disease, chronic right cerebellum infarction  Ambulatory EEG October  2025 intermittent left frontotemporal slowing,  Reviewed rheumatology Dr. Jeannetta evaluation March 06, 2024, rheumatoid arthritis involving multiple sites with positive rheumatoid factor, started Actemra  injection every 12  weeks   PHYSICAL EXAM:   Vitals:   03/30/24 1118  BP: 132/80  Pulse: 79  Weight: 168 lb 6.4 oz (76.4 kg)  Height: 5' 8 (1.727 m)     Body mass index is 25.61 kg/m.  PHYSICAL EXAMNIATION:  Gen: NAD, conversant, well nourised, well groomed                     Cardiovascular: Regular rate rhythm, no peripheral edema, warm, nontender. Eyes: Conjunctivae clear without exudates or hemorrhage Neck: Supple, no carotid bruits. Pulmonary: Clear to auscultation bilaterally   NEUROLOGICAL EXAM:  MENTAL STATUS: Speech:    Speech is normal; fluent and spontaneous with normal comprehension.  Cognition:         05/26/2022    2:19 PM 05/16/2021   11:00 AM  Montreal Cognitive Assessment   Visuospatial/ Executive (0/5) 3 4  Naming (0/3) 3 3  Attention: Read list of digits (0/2) 1 2  Attention: Read list of letters (0/1) 1 1  Attention: Serial 7 subtraction starting at 100 (0/3) 3 3  Language: Repeat phrase (0/2) 2 2  Language : Fluency (0/1) 0 0  Abstraction (0/2) 2 2  Delayed Recall (0/5) 2 0  Orientation (0/6) 5 5  Total 22 22      CRANIAL NERVES: CN II: Visual fields are full to confrontation. Pupils are round equal and briskly reactive to light. CN III, IV, VI: extraocular movement are normal. No ptosis. CN V: Facial sensation is intact to light touch CN VII: Face is symmetric with normal eye closure  CN VIII: Hearing is normal to causal conversation. CN IX, X: Phonation is normal. CN XI: Head turning and shoulder shrug are intact  MOTOR:Mild fixation of left upper extremity on rapid rotating movement  REFLEXES: Reflexes are 2 and symmetric at the biceps, triceps, knees, and ankles. Plantar responses are flexor.  SENSORY:   Intact to light touch,   COORDINATION: There is no trunk or limb dysmetria noted.  GAIT/STANCE: Rely on his walker, dragging right side  REVIEW OF SYSTEMS:  Full 14 system review of systems performed and notable only for as above All other  review of systems were negative.   ALLERGIES: Allergies  Allergen Reactions   Adalimumab Rash    HOME MEDICATIONS: Current Outpatient Medications  Medication Sig Dispense Refill   albuterol  (VENTOLIN  HFA) 108 (90 Base) MCG/ACT inhaler Inhale 2 puffs into the lungs every 6 (six) hours as needed for wheezing or shortness of breath. 8 g 2   aspirin  EC 81 MG tablet Take 1 tablet (81 mg total) by mouth daily. Swallow whole. 90 tablet 3   cyanocobalamin (VITAMIN B12) 1000 MCG tablet Take 1 tablet (1,000 mcg total) by mouth daily. 90 tablet 3   Fluticasone -Umeclidin-Vilant (TRELEGY ELLIPTA ) 100-62.5-25 MCG/ACT AEPB Inhale 1 puff into the lungs daily. 1 each 11   ibuprofen  (ADVIL ) 200 MG tablet Take 200 mg by mouth every 6 (six) hours as needed.     levETIRAcetam  (KEPPRA ) 250 MG tablet Take 1 tablet (250 mg total) by mouth 2 (two) times daily. 60 tablet 5   methocarbamol  (ROBAXIN ) 500 MG tablet Take 1 tablet (500 mg total) by mouth at bedtime as needed for muscle spasms. 30 tablet 1   mirabegron  ER (MYRBETRIQ ) 25 MG TB24 tablet TAKE 1 TABLET (  25 MG TOTAL) BY MOUTH DAILY. 90 tablet 1   rosuvastatin  (CRESTOR ) 10 MG tablet Take 1 tablet (10 mg total) by mouth daily. 90 tablet 3   thiamine  (VITAMIN B-1) 100 MG tablet Take 1 tablet (100 mg total) by mouth daily. 90 tablet 3   Tocilizumab  (ACTEMRA  ACTPEN) 162 MG/0.9ML SOAJ Inject 162 mg into the skin every 14 (fourteen) days. 5.4 mL 0   acetaminophen  (TYLENOL ) 650 MG CR tablet Take 650 mg by mouth every 8 (eight) hours as needed for pain. (Patient not taking: Reported on 03/30/2024)     No current facility-administered medications for this visit.    PAST MEDICAL HISTORY: Past Medical History:  Diagnosis Date   Abnormal liver function tests 12/15/2018   Alcoholic liver disease 12/15/2018   Alcoholism (HCC)    Elevated LDL cholesterol level 05/22/2013   Emphysema lung (HCC)    Epistaxis    Erectile dysfunction 05/22/2013   Frequent falls  11/09/2018   GERD (gastroesophageal reflux disease)    Hyperammonemia 11/15/2018   Memory change 08/17/2017   Pulmonary fibrosis (HCC) 01/18/2024   RA (rheumatoid arthritis) (HCC) 08/17/2017   RBBB 12/15/2018   Rheumatoid arthritis (HCC) 2010   Shuffling gait 12/19/2018   Tobacco abuse 04/26/2012   Weight loss, unintentional 11/09/2018   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 12/15/2018    PAST SURGICAL HISTORY: Past Surgical History:  Procedure Laterality Date   NASAL ENDOSCOPY WITH EPISTAXIS CONTROL N/A 04/12/2018   Procedure: NASAL ENDOSCOPY WITH EPISTAXIS CONTROL;  Surgeon: Roark Rush, MD;  Location: [facility];  Service: ENT;  Laterality: N/A;   VASECTOMY     VASECTOMY REVERSAL      FAMILY HISTORY: Family History  Problem Relation Age of Onset   Heart disease Mother    Lung cancer Father    Heart attack Father    Diabetes Sister    Pancreatitis Brother    CAD Brother    Aneurysm Brother    Healthy Daughter    Healthy Daughter    Colon cancer Neg Hx    Esophageal cancer Neg Hx    Stomach cancer Neg Hx     SOCIAL HISTORY: Social History   Socioeconomic History   Marital status: Married    Spouse name: Not on file   Number of children: Not on file   Years of education: Not on file   Highest education level: Not on file  Occupational History   Occupation: retired  Tobacco Use   Smoking status: Every Day    Current packs/day: 1.00    Average packs/day: 1.5 packs/day for 61.9 years (90.9 ttl pk-yrs)    Types: Cigarettes    Start date: 1964    Passive exposure: Current   Smokeless tobacco: Never   Tobacco comments:    Smokes 1 PPD- khj 02/28/2024  Vaping Use   Vaping status: Never Used  Substance and Sexual Activity   Alcohol use: Yes    Alcohol/week: 3.0 standard drinks of alcohol    Types: 3 Standard drinks or equivalent per week    Comment: mixed drinks 2/3 a day   Drug use: Never   Sexual activity: Not Currently  Other Topics Concern   Not  on file  Social History Narrative   Married. 3 children. 8 grandkids. 1 greatgrandkid.       Retired from being self employed advice worker         Does not have a living will.   Desires CPR, does  not want prolonged life support if futile.         05/16/2021   Caffeine: a little bit, some days none, sometimes 1 cup   Social Drivers of Corporate Investment Banker Strain: Low Risk  (01/19/2024)   Overall Financial Resource Strain (CARDIA)    Difficulty of Paying Living Expenses: Not hard at all  Food Insecurity: No Food Insecurity (01/19/2024)   Hunger Vital Sign    Worried About Running Out of Food in the Last Year: Never true    Ran Out of Food in the Last Year: Never true  Transportation Needs: No Transportation Needs (01/19/2024)   PRAPARE - Administrator, Civil Service (Medical): No    Lack of Transportation (Non-Medical): No  Physical Activity: Inactive (01/19/2024)   Exercise Vital Sign    Days of Exercise per Week: 0 days    Minutes of Exercise per Session: 0 min  Stress: No Stress Concern Present (01/19/2024)   [employer] of Occupational Health - Occupational Stress Questionnaire    Feeling of Stress: Not at all  Social Connections: Moderately Isolated (01/19/2024)   Social Connection and Isolation Panel    Frequency of Communication with Friends and Family: Twice a week    Frequency of Social Gatherings with Friends and Family: Twice a week    Attends Religious Services: Never    Database Administrator or Organizations: No    Attends Banker Meetings: Never    Marital Status: Married  Catering Manager Violence: Not At Risk (01/19/2024)   Humiliation, Afraid, Rape, and Kick questionnaire    Fear of Current or Ex-Partner: No    Emotionally Abused: No    Physically Abused: No    Sexually Abused: No      Modena Callander, M.D. Ph.D.  [facility] [address], [address] [place], [place] [address] Ph: [phone] Fax:  [phone]  CC:  Katrinka Garnette KIDD, MD [address] [place],  [place] [address]  Katrinka Garnette KIDD, MD

## 2024-04-06 ENCOUNTER — Encounter

## 2024-04-29 ENCOUNTER — Other Ambulatory Visit: Payer: Self-pay | Admitting: Internal Medicine

## 2024-04-29 DIAGNOSIS — M7062 Trochanteric bursitis, left hip: Secondary | ICD-10-CM

## 2024-05-01 NOTE — Progress Notes (Unsigned)
 "  Office Visit Note  Patient: William Gibson             Date of Birth: 07/13/1945           MRN: [id number]             PCP: Katrinka Garnette KIDD, MD Referring: Katrinka Garnette KIDD, MD Visit Date: 05/08/2024   Subjective:  No chief complaint on file.   History of Present Illness: William Gibson is a 79 y.o. male here for follow up seropositive rheumatoid arthritis and associated interstitial lung disease.   Previous HPI 03/06/2024 William Gibson is a 79 year old male here for follow-up seropositive rheumatoid arthritis and associated interstitial lung disease.   He experiences ongoing pain in both knees and shoulders, which disrupts his sleep as he cannot lie comfortably on either side. Additionally, he has left hip pain due to bursitis as the single worst complaint today, and he mentions that all his joints hurt.   His rheumatoid arthritis was confirmed by blood tests showing a high sedimentation rate and rheumatoid factor. He has previously been on injectable medications for arthritis, which he administered himself, but they were ineffective and caused adverse skin reactions.   For his hip bursitis, he currently takes Advil . He recalls receiving a cortisone shot previously, which only provided relief for three to four days. The pain is exacerbated by movement, particularly when moving his leg sideways or turning over in bed, which causes a popping sensation. He has not found long-term relief from previous treatments.       Previous HPI 02/01/24 William Gibson is a 79 year old male with seropositive rheumatoid arthritis here for evaluation and management   He has a history of rheumatoid arthritis and has been experiencing lung inflammation, prompting the referral. He was under rheumatologic care with Dr. Ishmael for two to three years but stopped due to lack of improvement. Medications tried include Humira, which caused a leg rash, and cimzia which he stopped due to lack  of perceived benefit. He finds relief from arthritis symptoms treating OTC with two Advil  and two mixed drinks.   He previously stopped prescription NSAID use after a stroke in late 2022. Also has problems related to past TBI with [facility] and also microvascular changes on brain imaging.   He experiences pain primarily in his left knee and left hip. He is cautious when turning over in bed due to hip pain and feels the bones in his knee move, though it is not painful. He uses a cane for balance and a walker at home due to balance issues, avoiding going outside to prevent falls. He reports knee swelling but not in his hands, though he cannot straighten his hand due to contracture and arthritis.   He has a history of recently taking prednisone  and a Z-Pak. He uses a Trelegy inhaler and albuterol  PRN.   He consumes two to three mixed drinks a day, with some days of no alcohol intake, and has no known liver problems. He has a past history of alcohol abuse.   Labs reviewed 01/2024 RF 394.8 MyoMarker 3 neg ANCA neg CK/aldolase wnl CCP pending ANA pending   Imaging reviewed 01/19/24 HRCT Chest IMPRESSION: 1. Mild bibasilar predominant pulmonary fibrosis featuring subpleural bronchiolectasis in the deep lung bases. Findings are categorized as probable UIP per consensus guidelines: Diagnosis of Idiopathic Pulmonary Fibrosis: An Official ATS/ERS/JRS/ALAT Clinical Practice Guideline. Am JINNY Honey Crit Care Med Vol 198, Iss  5, [id number], Dec 26 2016. 2. Moderate emphysema and diffuse bilateral bronchial wall thickening. 3. Coronary artery disease.   No Rheumatology ROS completed.   PMFS History:  Patient Active Problem List   Diagnosis Date Noted   TBI (traumatic brain injury) (HCC) 03/30/2024   Dupuytren contracture of both hands 02/01/2024   Prediabetes 02/01/2024   Cerebrovascular accident (CVA) (HCC) 05/26/2022   Right hemiparesis (HCC) 10/21/2021   High risk medication use 05/20/2021    Plantar fasciitis 05/20/2021   History of stroke 05/16/2021   Aortic atherosclerosis 06/03/2020   Emphysema lung (HCC) 06/03/2020   History of pancreatitis 01/12/2020   Major depression in full remission 01/12/2020   Insomnia 01/12/2020   Chronic post-traumatic headache 12/13/2019   Vascular headache    Hyponatremia    History of subarachnoid hemorrhage 11/04/2019   Head injury 11/04/2019   Hyperlipidemia 02/06/2019   Shuffling gait 12/19/2018   Alcoholic liver disease 12/15/2018   RBBB 12/15/2018   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 12/15/2018   Hyperammonemia 11/15/2018   Frequent falls 11/09/2018   RA (rheumatoid arthritis) (HCC) 08/17/2017   Memory change 08/17/2017   Alcohol dependence in early full remission (HCC) 12/15/2016   Osteoarthritis 01/30/2016   GERD (gastroesophageal reflux disease) 05/23/2014   Erectile dysfunction 05/22/2013   Tobacco abuse 04/26/2012    Past Medical History:  Diagnosis Date   Abnormal liver function tests 12/15/2018   Alcoholic liver disease 12/15/2018   Alcoholism (HCC)    Elevated LDL cholesterol level 05/22/2013   Emphysema lung (HCC)    Epistaxis    Erectile dysfunction 05/22/2013   Frequent falls 11/09/2018   GERD (gastroesophageal reflux disease)    Hyperammonemia 11/15/2018   Memory change 08/17/2017   Pulmonary fibrosis (HCC) 01/18/2024   RA (rheumatoid arthritis) (HCC) 08/17/2017   RBBB 12/15/2018   Rheumatoid arthritis (HCC) 2010   Shuffling gait 12/19/2018   Tobacco abuse 04/26/2012   Weight loss, unintentional 11/09/2018   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 12/15/2018    Family History  Problem Relation Age of Onset   Heart disease Mother    Lung cancer Father    Heart attack Father    Diabetes Sister    Pancreatitis Brother    CAD Brother    Aneurysm Brother    Healthy Daughter    Healthy Daughter    Colon cancer Neg Hx    Esophageal cancer Neg Hx    Stomach cancer Neg Hx    Past  Surgical History:  Procedure Laterality Date   NASAL ENDOSCOPY WITH EPISTAXIS CONTROL N/A 04/12/2018   Procedure: NASAL ENDOSCOPY WITH EPISTAXIS CONTROL;  Surgeon: Roark Rush, MD;  Location: [facility];  Service: ENT;  Laterality: N/A;   VASECTOMY     VASECTOMY REVERSAL     Social History   Social History Narrative   Married. 3 children. 8 grandkids. 1 greatgrandkid.       Retired from being self employed advice worker         Does not have a living will.   Desires CPR, does not want prolonged life support if futile.         05/16/2021   Caffeine: a little bit, some days none, sometimes 1 cup   Immunization History  Administered Date(s) Administered   Fluad Quad(high Dose 65+) 04/18/2019, 01/12/2020   INFLUENZA, HIGH DOSE SEASONAL PF 03/11/2021   Pneumococcal Polysaccharide-23 04/12/2020     Objective: Vital Signs: There were no vitals taken  for this visit.   Physical Exam   Musculoskeletal Exam: ***  CDAI Exam: CDAI Score: -- Patient Global: --; Provider Global: -- Swollen: --; Tender: -- Joint Exam 05/08/2024   No joint exam has been documented for this visit   There is currently no information documented on the homunculus. Go to the Rheumatology activity and complete the homunculus joint exam.  Investigation: No additional findings.  Imaging: No results found.  Recent Labs: Lab Results  Component Value Date   WBC 7.6 11/10/2023   HGB 14.6 11/10/2023   PLT 211 11/10/2023   NA 140 11/10/2023   K 4.8 11/10/2023   CL 102 11/10/2023   CO2 21 11/10/2023   GLUCOSE 101 (H) 11/10/2023   BUN 17 11/10/2023   CREATININE 0.86 11/10/2023   BILITOT 0.4 11/10/2023   ALKPHOS 88 11/10/2023   AST 21 11/10/2023   ALT 12 11/10/2023   PROT 8.3 11/10/2023   ALBUMIN 4.1 11/10/2023   CALCIUM  9.5 11/10/2023   GFRAA 99 04/12/2020   QFTBGOLDPLUS NEGATIVE 02/01/2024    Speciality Comments: No specialty comments available.  Procedures:  No procedures  performed Allergies: Adalimumab   Assessment / Plan:     Visit Diagnoses: No diagnosis found.  ***  Orders: No orders of the defined types were placed in this encounter.  No orders of the defined types were placed in this encounter.    Follow-Up Instructions: No follow-ups on file.   Marenda Accardi M Rishaan Gunner, CMA  Note - This record has been created using Animal nutritionist.  Chart creation errors have been sought, but may not always  have been located. Such creation errors do not reflect on  the standard of medical care. "

## 2024-05-01 NOTE — Telephone Encounter (Signed)
 Last Fill: 03/06/2024  Next Visit: 05/08/2024  Last Visit: 03/06/2024  Dx: Rheumatoid arthritis involving multiple sites with positive rheumatoid factor   Current Dose per office note on 03/06/2024:methocarbamol  (ROBAXIN ) 500 MG tablet   Okay to refill Methocarbamol ?

## 2024-05-08 ENCOUNTER — Ambulatory Visit: Admitting: Internal Medicine

## 2024-05-08 DIAGNOSIS — M0579 Rheumatoid arthritis with rheumatoid factor of multiple sites without organ or systems involvement: Secondary | ICD-10-CM

## 2024-05-08 DIAGNOSIS — M7062 Trochanteric bursitis, left hip: Secondary | ICD-10-CM

## 2024-05-08 DIAGNOSIS — M159 Polyosteoarthritis, unspecified: Secondary | ICD-10-CM

## 2024-05-08 DIAGNOSIS — Z79899 Other long term (current) drug therapy: Secondary | ICD-10-CM

## 2024-05-18 ENCOUNTER — Telehealth: Payer: Self-pay | Admitting: Pharmacist

## 2024-05-18 NOTE — Telephone Encounter (Signed)
 Patient due for Actemra  PA renewal (insurnace change) however has not been seen since start treatment. Form completed and sent to [facility] for submission with clinicals (assuming responding well to treatment) once seen  Patient needs appt with Dr. Jeannetta or Daved Holstein, PA-C for assessment of response  Sherry Pennant, PharmD, MPH, BCPS, CPP Clinical Pharmacist

## 2024-05-23 NOTE — Telephone Encounter (Signed)
 LMOM for patient to call and schedule earlier appointment with Dr. Jeannetta or Daved Holstein.

## 2024-06-05 ENCOUNTER — Ambulatory Visit: Admitting: Student in an Organized Health Care Education/Training Program

## 2024-06-27 ENCOUNTER — Ambulatory Visit: Admitting: Internal Medicine

## 2024-07-17 ENCOUNTER — Ambulatory Visit

## 2024-10-10 ENCOUNTER — Ambulatory Visit: Admitting: Adult Health

## 2025-01-24 ENCOUNTER — Ambulatory Visit
